# Patient Record
Sex: Female | Born: 1935 | Race: White | Hispanic: No | Marital: Married | Smoking: Never smoker
Health system: Southern US, Community
[De-identification: ages and names within clinical notes are randomized; demographics above are authoritative.]

## PROBLEM LIST (undated history)

## (undated) DIAGNOSIS — E119 Type 2 diabetes mellitus without complications: Secondary | ICD-10-CM

## (undated) DIAGNOSIS — N3010 Interstitial cystitis (chronic) without hematuria: Secondary | ICD-10-CM

## (undated) DIAGNOSIS — H3530 Unspecified macular degeneration: Secondary | ICD-10-CM

## (undated) DIAGNOSIS — R3129 Other microscopic hematuria: Secondary | ICD-10-CM

## (undated) DIAGNOSIS — K219 Gastro-esophageal reflux disease without esophagitis: Secondary | ICD-10-CM

## (undated) DIAGNOSIS — D510 Vitamin B12 deficiency anemia due to intrinsic factor deficiency: Secondary | ICD-10-CM

## (undated) DIAGNOSIS — I10 Essential (primary) hypertension: Secondary | ICD-10-CM

## (undated) DIAGNOSIS — M961 Postlaminectomy syndrome, not elsewhere classified: Secondary | ICD-10-CM

## (undated) DIAGNOSIS — E785 Hyperlipidemia, unspecified: Secondary | ICD-10-CM

## (undated) DIAGNOSIS — M8580 Other specified disorders of bone density and structure, unspecified site: Secondary | ICD-10-CM

## (undated) DIAGNOSIS — R079 Chest pain, unspecified: Secondary | ICD-10-CM

## (undated) DIAGNOSIS — G5601 Carpal tunnel syndrome, right upper limb: Secondary | ICD-10-CM

## (undated) DIAGNOSIS — M545 Low back pain, unspecified: Secondary | ICD-10-CM

## (undated) DIAGNOSIS — N183 Chronic kidney disease, stage 3 unspecified: Secondary | ICD-10-CM

## (undated) DIAGNOSIS — I493 Ventricular premature depolarization: Secondary | ICD-10-CM

## (undated) DIAGNOSIS — G8929 Other chronic pain: Secondary | ICD-10-CM

## (undated) HISTORY — PX: JOINT REPLACEMENT: SHX530

## (undated) HISTORY — DX: Interstitial cystitis (chronic) without hematuria: N30.10

## (undated) HISTORY — DX: Low back pain, unspecified: M54.50

## (undated) HISTORY — DX: Unspecified macular degeneration: H35.30

## (undated) HISTORY — DX: Chronic kidney disease, stage 3 unspecified: N18.30

## (undated) HISTORY — DX: Other chronic pain: G89.29

## (undated) HISTORY — DX: Low back pain: M54.5

## (undated) HISTORY — DX: Postlaminectomy syndrome, not elsewhere classified: M96.1

## (undated) HISTORY — DX: Other microscopic hematuria: R31.29

## (undated) HISTORY — DX: Other specified disorders of bone density and structure, unspecified site: M85.80

## (undated) HISTORY — DX: Essential (primary) hypertension: I10

## (undated) HISTORY — DX: Type 2 diabetes mellitus without complications: E11.9

## (undated) HISTORY — DX: Gastro-esophageal reflux disease without esophagitis: K21.9

## (undated) HISTORY — DX: Carpal tunnel syndrome, right upper limb: G56.01

## (undated) HISTORY — DX: Chronic kidney disease, stage 3 (moderate): N18.3

## (undated) HISTORY — DX: Ventricular premature depolarization: I49.3

## (undated) HISTORY — DX: Hyperlipidemia, unspecified: E78.5

## (undated) HISTORY — DX: Vitamin B12 deficiency anemia due to intrinsic factor deficiency: D51.0

## (undated) HISTORY — PX: BACK SURGERY: SHX140

---

## 1997-06-17 ENCOUNTER — Other Ambulatory Visit: Admission: RE | Admit: 1997-06-17 | Discharge: 1997-06-17 | Payer: Self-pay | Admitting: Family Medicine

## 1997-07-09 ENCOUNTER — Other Ambulatory Visit: Admission: RE | Admit: 1997-07-09 | Discharge: 1997-07-09 | Payer: Self-pay | Admitting: Family Medicine

## 1997-09-07 ENCOUNTER — Inpatient Hospital Stay (HOSPITAL_COMMUNITY): Admission: RE | Admit: 1997-09-07 | Discharge: 1997-09-24 | Payer: Self-pay | Admitting: *Deleted

## 1998-07-18 ENCOUNTER — Inpatient Hospital Stay (HOSPITAL_COMMUNITY): Admission: AD | Admit: 1998-07-18 | Discharge: 1998-08-19 | Payer: Self-pay | Admitting: *Deleted

## 1998-08-22 ENCOUNTER — Encounter (HOSPITAL_COMMUNITY): Admission: RE | Admit: 1998-08-22 | Discharge: 1998-08-26 | Payer: Self-pay

## 1999-03-30 ENCOUNTER — Other Ambulatory Visit: Admission: RE | Admit: 1999-03-30 | Discharge: 1999-03-30 | Payer: Self-pay | Admitting: Family Medicine

## 1999-08-14 ENCOUNTER — Encounter: Payer: Self-pay | Admitting: Family Medicine

## 1999-08-14 ENCOUNTER — Encounter: Admission: RE | Admit: 1999-08-14 | Discharge: 1999-08-14 | Payer: Self-pay | Admitting: Family Medicine

## 2000-07-31 ENCOUNTER — Encounter: Admission: RE | Admit: 2000-07-31 | Discharge: 2000-10-29 | Payer: Self-pay | Admitting: Family Medicine

## 2000-11-13 ENCOUNTER — Encounter: Admission: RE | Admit: 2000-11-13 | Discharge: 2000-11-13 | Payer: Self-pay | Admitting: Family Medicine

## 2000-11-13 ENCOUNTER — Encounter: Payer: Self-pay | Admitting: Family Medicine

## 2001-04-15 ENCOUNTER — Encounter: Admission: RE | Admit: 2001-04-15 | Discharge: 2001-04-15 | Payer: Self-pay | Admitting: Neurosurgery

## 2001-04-15 ENCOUNTER — Encounter: Payer: Self-pay | Admitting: Neurosurgery

## 2001-04-28 ENCOUNTER — Encounter: Payer: Self-pay | Admitting: Neurosurgery

## 2001-04-30 ENCOUNTER — Encounter: Payer: Self-pay | Admitting: Neurosurgery

## 2001-04-30 ENCOUNTER — Inpatient Hospital Stay (HOSPITAL_COMMUNITY): Admission: RE | Admit: 2001-04-30 | Discharge: 2001-05-01 | Payer: Self-pay | Admitting: Neurosurgery

## 2001-09-29 ENCOUNTER — Encounter: Admission: RE | Admit: 2001-09-29 | Discharge: 2001-09-29 | Payer: Self-pay | Admitting: Family Medicine

## 2001-09-29 ENCOUNTER — Encounter: Payer: Self-pay | Admitting: Family Medicine

## 2001-11-18 ENCOUNTER — Other Ambulatory Visit: Admission: RE | Admit: 2001-11-18 | Discharge: 2001-11-18 | Payer: Self-pay | Admitting: Family Medicine

## 2001-11-20 ENCOUNTER — Encounter: Admission: RE | Admit: 2001-11-20 | Discharge: 2001-11-20 | Payer: Self-pay | Admitting: Family Medicine

## 2001-11-20 ENCOUNTER — Encounter: Payer: Self-pay | Admitting: Family Medicine

## 2001-12-31 ENCOUNTER — Ambulatory Visit (HOSPITAL_BASED_OUTPATIENT_CLINIC_OR_DEPARTMENT_OTHER): Admission: RE | Admit: 2001-12-31 | Discharge: 2001-12-31 | Payer: Self-pay | Admitting: Urology

## 2002-04-09 ENCOUNTER — Encounter: Payer: Self-pay | Admitting: Obstetrics and Gynecology

## 2002-04-09 ENCOUNTER — Ambulatory Visit (HOSPITAL_COMMUNITY): Admission: RE | Admit: 2002-04-09 | Discharge: 2002-04-09 | Payer: Self-pay | Admitting: Obstetrics and Gynecology

## 2002-04-21 ENCOUNTER — Ambulatory Visit (HOSPITAL_COMMUNITY): Admission: RE | Admit: 2002-04-21 | Discharge: 2002-04-21 | Payer: Self-pay | Admitting: Gastroenterology

## 2002-04-28 ENCOUNTER — Encounter: Admission: RE | Admit: 2002-04-28 | Discharge: 2002-04-28 | Payer: Self-pay | Admitting: Gastroenterology

## 2002-04-28 ENCOUNTER — Encounter: Payer: Self-pay | Admitting: Gastroenterology

## 2003-01-05 ENCOUNTER — Ambulatory Visit (HOSPITAL_COMMUNITY): Admission: RE | Admit: 2003-01-05 | Discharge: 2003-01-05 | Payer: Self-pay | Admitting: Urology

## 2003-01-05 ENCOUNTER — Ambulatory Visit (HOSPITAL_BASED_OUTPATIENT_CLINIC_OR_DEPARTMENT_OTHER): Admission: RE | Admit: 2003-01-05 | Discharge: 2003-01-05 | Payer: Self-pay | Admitting: Urology

## 2003-04-16 ENCOUNTER — Inpatient Hospital Stay (HOSPITAL_COMMUNITY): Admission: AD | Admit: 2003-04-16 | Discharge: 2003-04-19 | Payer: Self-pay | Admitting: Cardiology

## 2003-05-01 ENCOUNTER — Emergency Department (HOSPITAL_COMMUNITY): Admission: EM | Admit: 2003-05-01 | Discharge: 2003-05-01 | Payer: Self-pay | Admitting: Emergency Medicine

## 2004-03-08 ENCOUNTER — Encounter: Admission: RE | Admit: 2004-03-08 | Discharge: 2004-03-08 | Payer: Self-pay | Admitting: Family Medicine

## 2004-03-16 ENCOUNTER — Encounter: Admission: RE | Admit: 2004-03-16 | Discharge: 2004-03-16 | Payer: Self-pay | Admitting: Family Medicine

## 2004-03-18 ENCOUNTER — Encounter: Admission: RE | Admit: 2004-03-18 | Discharge: 2004-03-18 | Payer: Self-pay | Admitting: Family Medicine

## 2004-03-28 ENCOUNTER — Encounter: Admission: RE | Admit: 2004-03-28 | Discharge: 2004-03-28 | Payer: Self-pay | Admitting: Family Medicine

## 2004-06-08 ENCOUNTER — Encounter: Admission: RE | Admit: 2004-06-08 | Discharge: 2004-06-08 | Payer: Self-pay | Admitting: Gastroenterology

## 2004-07-17 ENCOUNTER — Ambulatory Visit (HOSPITAL_COMMUNITY): Admission: RE | Admit: 2004-07-17 | Discharge: 2004-07-17 | Payer: Self-pay | Admitting: Gastroenterology

## 2004-11-28 ENCOUNTER — Emergency Department (HOSPITAL_COMMUNITY): Admission: EM | Admit: 2004-11-28 | Discharge: 2004-11-28 | Payer: Self-pay | Admitting: Emergency Medicine

## 2005-01-17 ENCOUNTER — Other Ambulatory Visit: Admission: RE | Admit: 2005-01-17 | Discharge: 2005-01-17 | Payer: Self-pay | Admitting: Family Medicine

## 2005-01-18 ENCOUNTER — Encounter: Admission: RE | Admit: 2005-01-18 | Discharge: 2005-01-18 | Payer: Self-pay | Admitting: Family Medicine

## 2005-07-20 ENCOUNTER — Encounter: Admission: RE | Admit: 2005-07-20 | Discharge: 2005-07-20 | Payer: Self-pay | Admitting: Family Medicine

## 2006-09-16 ENCOUNTER — Inpatient Hospital Stay (HOSPITAL_COMMUNITY): Admission: EM | Admit: 2006-09-16 | Discharge: 2006-09-19 | Payer: Self-pay | Admitting: *Deleted

## 2006-09-17 ENCOUNTER — Ambulatory Visit: Payer: Self-pay | Admitting: *Deleted

## 2006-12-12 ENCOUNTER — Inpatient Hospital Stay (HOSPITAL_COMMUNITY): Admission: RE | Admit: 2006-12-12 | Discharge: 2006-12-18 | Payer: Self-pay | Admitting: Orthopedic Surgery

## 2007-10-13 ENCOUNTER — Other Ambulatory Visit: Admission: RE | Admit: 2007-10-13 | Discharge: 2007-10-13 | Payer: Self-pay | Admitting: Family Medicine

## 2007-12-15 ENCOUNTER — Encounter: Admission: RE | Admit: 2007-12-15 | Discharge: 2007-12-15 | Payer: Self-pay | Admitting: Neurosurgery

## 2008-01-06 ENCOUNTER — Inpatient Hospital Stay (HOSPITAL_COMMUNITY): Admission: RE | Admit: 2008-01-06 | Discharge: 2008-01-07 | Payer: Self-pay | Admitting: Neurosurgery

## 2008-08-18 ENCOUNTER — Inpatient Hospital Stay (HOSPITAL_COMMUNITY): Admission: EM | Admit: 2008-08-18 | Discharge: 2008-08-27 | Payer: Self-pay | Admitting: Emergency Medicine

## 2009-07-19 ENCOUNTER — Ambulatory Visit: Payer: Self-pay | Admitting: Pulmonary Disease

## 2009-07-19 ENCOUNTER — Inpatient Hospital Stay (HOSPITAL_COMMUNITY): Admission: EM | Admit: 2009-07-19 | Discharge: 2009-07-27 | Payer: Self-pay | Admitting: Pulmonary Disease

## 2009-07-21 ENCOUNTER — Ambulatory Visit: Payer: Self-pay | Admitting: Psychiatry

## 2009-07-25 ENCOUNTER — Ambulatory Visit: Payer: Self-pay | Admitting: Psychiatry

## 2009-07-27 ENCOUNTER — Inpatient Hospital Stay (HOSPITAL_COMMUNITY): Admission: EM | Admit: 2009-07-27 | Discharge: 2009-07-31 | Payer: Self-pay | Admitting: Psychiatry

## 2009-07-27 ENCOUNTER — Ambulatory Visit: Payer: Self-pay | Admitting: Psychiatry

## 2010-05-22 LAB — GLUCOSE, CAPILLARY
Glucose-Capillary: 67 mg/dL — ABNORMAL LOW (ref 70–99)
Glucose-Capillary: 124 mg/dL — ABNORMAL HIGH (ref 70–99)
Glucose-Capillary: 124 mg/dL — ABNORMAL HIGH (ref 70–99)
Glucose-Capillary: 127 mg/dL — ABNORMAL HIGH (ref 70–99)
Glucose-Capillary: 133 mg/dL — ABNORMAL HIGH (ref 70–99)
Glucose-Capillary: 177 mg/dL — ABNORMAL HIGH (ref 70–99)
Glucose-Capillary: 194 mg/dL — ABNORMAL HIGH (ref 70–99)
Glucose-Capillary: 66 mg/dL — ABNORMAL LOW (ref 70–99)
Glucose-Capillary: 69 mg/dL — ABNORMAL LOW (ref 70–99)
Glucose-Capillary: 90 mg/dL (ref 70–99)
Glucose-Capillary: 104 mg/dL — ABNORMAL HIGH (ref 70–99)
Glucose-Capillary: 107 mg/dL — ABNORMAL HIGH (ref 70–99)
Glucose-Capillary: 119 mg/dL — ABNORMAL HIGH (ref 70–99)
Glucose-Capillary: 122 mg/dL — ABNORMAL HIGH (ref 70–99)
Glucose-Capillary: 123 mg/dL — ABNORMAL HIGH (ref 70–99)
Glucose-Capillary: 124 mg/dL — ABNORMAL HIGH (ref 70–99)
Glucose-Capillary: 139 mg/dL — ABNORMAL HIGH (ref 70–99)
Glucose-Capillary: 157 mg/dL — ABNORMAL HIGH (ref 70–99)
Glucose-Capillary: 159 mg/dL — ABNORMAL HIGH (ref 70–99)
Glucose-Capillary: 163 mg/dL — ABNORMAL HIGH (ref 70–99)
Glucose-Capillary: 163 mg/dL — ABNORMAL HIGH (ref 70–99)
Glucose-Capillary: 165 mg/dL — ABNORMAL HIGH (ref 70–99)
Glucose-Capillary: 97 mg/dL (ref 70–99)

## 2010-05-22 LAB — CBC
HCT: 41.6 % (ref 36.0–46.0)
HCT: 30 % — ABNORMAL LOW (ref 36.0–46.0)
HCT: 31.7 % — ABNORMAL LOW (ref 36.0–46.0)
HCT: 33.1 % — ABNORMAL LOW (ref 36.0–46.0)
Hemoglobin: 10.3 g/dL — ABNORMAL LOW (ref 12.0–15.0)
Hemoglobin: 10.8 g/dL — ABNORMAL LOW (ref 12.0–15.0)
MCHC: 34.8 g/dL (ref 30.0–36.0)
MCV: 90.7 fL (ref 78.0–100.0)
MCV: 89.8 fL (ref 78.0–100.0)
Platelets: 152 10*3/uL (ref 150–400)
Platelets: 159 10*3/uL (ref 150–400)
RBC: 4.59 MIL/uL (ref 3.87–5.11)
RBC: 3.47 MIL/uL — ABNORMAL LOW (ref 3.87–5.11)
RBC: 3.66 MIL/uL — ABNORMAL LOW (ref 3.87–5.11)
RDW: 14.2 % (ref 11.5–15.5)
RDW: 14.5 % (ref 11.5–15.5)
WBC: 7.3 10*3/uL (ref 4.0–10.5)
WBC: 5 10*3/uL (ref 4.0–10.5)
WBC: 6.7 10*3/uL (ref 4.0–10.5)
WBC: 9.3 10*3/uL (ref 4.0–10.5)

## 2010-05-22 LAB — RAPID URINE DRUG SCREEN, HOSP PERFORMED
Amphetamines: NOT DETECTED
Barbiturates: NOT DETECTED
Tetrahydrocannabinol: NOT DETECTED

## 2010-05-22 LAB — BLOOD GAS, ARTERIAL
Drawn by: 307971
FIO2: 1 %
O2 Saturation: 99.6 %
Patient temperature: 98.6
pH, Arterial: 7.445 — ABNORMAL HIGH (ref 7.350–7.400)

## 2010-05-22 LAB — BASIC METABOLIC PANEL
BUN: 6 mg/dL (ref 6–23)
BUN: 6 mg/dL (ref 6–23)
CO2: 25 mEq/L (ref 19–32)
CO2: 28 mEq/L (ref 19–32)
Chloride: 105 mEq/L (ref 96–112)
Chloride: 108 mEq/L (ref 96–112)
Chloride: 109 mEq/L (ref 96–112)
Chloride: 109 mEq/L (ref 96–112)
Creatinine, Ser: 0.76 mg/dL (ref 0.4–1.2)
Creatinine, Ser: 0.87 mg/dL (ref 0.4–1.2)
Creatinine, Ser: 0.99 mg/dL (ref 0.4–1.2)
GFR calc Af Amer: >60 mL/min (ref >60)
GFR calc Af Amer: >60 mL/min (ref >60)
GFR calc non Af Amer: 54 mL/min — ABNORMAL LOW (ref >60)
GFR calc non Af Amer: >60 mL/min (ref >60)
GFR calc non Af Amer: >60 mL/min (ref >60)
Glucose, Bld: 105 mg/dL — ABNORMAL HIGH (ref 70–99)
Glucose, Bld: 160 mg/dL — ABNORMAL HIGH (ref 70–99)
Potassium: 3.5 mEq/L (ref 3.5–5.1)
Potassium: 3.6 mEq/L (ref 3.5–5.1)
Potassium: 3.6 mEq/L (ref 3.5–5.1)
Sodium: 138 mEq/L (ref 135–145)
Sodium: 143 mEq/L (ref 135–145)

## 2010-05-22 LAB — POCT I-STAT, CHEM 8
Calcium, Ion: 1.19 mmol/L (ref 1.12–1.32)
Glucose, Bld: 148 mg/dL — ABNORMAL HIGH (ref 70–99)
HCT: 43 % (ref 36.0–46.0)
Hemoglobin: 14.6 g/dL (ref 12.0–15.0)
TCO2: 29 mmol/L (ref 0–100)

## 2010-05-22 LAB — COMPREHENSIVE METABOLIC PANEL
ALT: 12 U/L (ref 0–35)
Albumin: 2.9 g/dL — ABNORMAL LOW (ref 3.5–5.2)
Alkaline Phosphatase: 62 U/L (ref 39–117)
Calcium: 7.6 mg/dL — ABNORMAL LOW (ref 8.4–10.5)
GFR calc Af Amer: >60 mL/min (ref >60)
Glucose, Bld: 155 mg/dL — ABNORMAL HIGH (ref 70–99)
Potassium: 3.2 mEq/L — ABNORMAL LOW (ref 3.5–5.1)
Sodium: 139 mEq/L (ref 135–145)
Total Protein: 5.1 g/dL — ABNORMAL LOW (ref 6.0–8.3)

## 2010-05-22 LAB — URINE CULTURE
Colony Count: NO GROWTH
Culture: NO GROWTH

## 2010-05-22 LAB — POCT I-STAT 3, ART BLOOD GAS (G3+)
Bicarbonate: 30.5 mEq/L — ABNORMAL HIGH (ref 20.0–24.0)
Patient temperature: 98.3
TCO2: 28 mmol/L (ref 0–100)
TCO2: 32 mmol/L (ref 0–100)
pCO2 arterial: 36.2 mmHg (ref 35.0–45.0)
pCO2 arterial: 41.3 mmHg (ref 35.0–45.0)
pH, Arterial: 7.415 — ABNORMAL HIGH (ref 7.350–7.400)
pH, Arterial: 7.533 — ABNORMAL HIGH (ref 7.350–7.400)
pO2, Arterial: 113 mmHg — ABNORMAL HIGH (ref 80.0–100.0)

## 2010-05-22 LAB — CARDIAC PANEL(CRET KIN+CKTOT+MB+TROPI)
CK, MB: 1.9 ng/mL (ref 0.3–4.0)
CK, MB: 2 ng/mL (ref 0.3–4.0)
Relative Index: INVALID (ref 0.0–2.5)
Total CK: 46 U/L (ref 7–177)
Troponin I: 0.05 ng/mL (ref 0.00–0.06)
Troponin I: 0.11 ng/mL — ABNORMAL HIGH (ref 0.00–0.06)

## 2010-05-22 LAB — HEPATITIS B CORE ANTIBODY, TOTAL: Hep B Core Total Ab: NEGATIVE

## 2010-05-22 LAB — DIFFERENTIAL
Eosinophils Absolute: 0 10*3/uL (ref 0.0–0.7)
Eosinophils Relative: 1 % (ref 0–5)
Lymphs Abs: 0.9 10*3/uL (ref 0.7–4.0)
Monocytes Relative: 5 % (ref 3–12)

## 2010-05-22 LAB — HEPATIC FUNCTION PANEL
Alkaline Phosphatase: 59 U/L (ref 39–117)
Indirect Bilirubin: 0.4 mg/dL (ref 0.3–0.9)
Total Bilirubin: 0.6 mg/dL (ref 0.3–1.2)

## 2010-05-22 LAB — URINALYSIS, ROUTINE W REFLEX MICROSCOPIC
Nitrite: NEGATIVE
Protein, ur: 30 mg/dL — AB
Specific Gravity, Urine: 1.015 (ref 1.005–1.030)
Urobilinogen, UA: 0.2 mg/dL (ref 0.0–1.0)

## 2010-05-22 LAB — URINE MICROSCOPIC-ADD ON

## 2010-05-22 LAB — HEPATITIS B SURFACE ANTIGEN: Hepatitis B Surface Ag: NEGATIVE

## 2010-05-22 LAB — PROTIME-INR
INR: 1.07 (ref 0.00–1.49)
Prothrombin Time: 13.8 seconds (ref 11.6–15.2)

## 2010-05-22 LAB — LITHIUM LEVEL: Lithium Lvl: 0.32 mEq/L — ABNORMAL LOW (ref 0.80–1.40)

## 2010-05-22 LAB — APTT: aPTT: 25 seconds (ref 24–37)

## 2010-05-22 LAB — SALICYLATE LEVEL: Salicylate Lvl: <4 mg/dL (ref 2.8–20.0)

## 2010-05-31 ENCOUNTER — Other Ambulatory Visit: Payer: Self-pay | Admitting: Gastroenterology

## 2010-05-31 ENCOUNTER — Ambulatory Visit (HOSPITAL_COMMUNITY)
Admission: RE | Admit: 2010-05-31 | Discharge: 2010-05-31 | Disposition: A | Discharge status: Home / Self Care | Payer: Medicare Other | Source: Ambulatory Visit | Attending: Gastroenterology | Admitting: Gastroenterology

## 2010-05-31 DIAGNOSIS — K219 Gastro-esophageal reflux disease without esophagitis: Secondary | ICD-10-CM | POA: Insufficient documentation

## 2010-05-31 DIAGNOSIS — Z79899 Other long term (current) drug therapy: Secondary | ICD-10-CM | POA: Insufficient documentation

## 2010-05-31 DIAGNOSIS — K449 Diaphragmatic hernia without obstruction or gangrene: Secondary | ICD-10-CM | POA: Insufficient documentation

## 2010-05-31 DIAGNOSIS — K863 Pseudocyst of pancreas: Secondary | ICD-10-CM | POA: Insufficient documentation

## 2010-05-31 DIAGNOSIS — E119 Type 2 diabetes mellitus without complications: Secondary | ICD-10-CM | POA: Insufficient documentation

## 2010-05-31 DIAGNOSIS — F3289 Other specified depressive episodes: Secondary | ICD-10-CM | POA: Insufficient documentation

## 2010-05-31 DIAGNOSIS — I10 Essential (primary) hypertension: Secondary | ICD-10-CM | POA: Insufficient documentation

## 2010-05-31 DIAGNOSIS — K862 Cyst of pancreas: Secondary | ICD-10-CM | POA: Insufficient documentation

## 2010-05-31 DIAGNOSIS — F329 Major depressive disorder, single episode, unspecified: Secondary | ICD-10-CM | POA: Insufficient documentation

## 2010-05-31 DIAGNOSIS — K314 Gastric diverticulum: Secondary | ICD-10-CM | POA: Insufficient documentation

## 2010-05-31 DIAGNOSIS — K838 Other specified diseases of biliary tract: Secondary | ICD-10-CM | POA: Insufficient documentation

## 2010-05-31 DIAGNOSIS — E785 Hyperlipidemia, unspecified: Secondary | ICD-10-CM | POA: Insufficient documentation

## 2010-05-31 DIAGNOSIS — I2510 Atherosclerotic heart disease of native coronary artery without angina pectoris: Secondary | ICD-10-CM | POA: Insufficient documentation

## 2010-05-31 LAB — GLUCOSE, CAPILLARY: Glucose-Capillary: 142 mg/dL — ABNORMAL HIGH (ref 70–99)

## 2010-06-12 LAB — URINALYSIS, ROUTINE W REFLEX MICROSCOPIC
Nitrite: NEGATIVE
Specific Gravity, Urine: 1.017 (ref 1.005–1.030)
Urobilinogen, UA: 1 mg/dL (ref 0.0–1.0)
pH: 5 (ref 5.0–8.0)

## 2010-06-12 LAB — GLUCOSE, CAPILLARY
Glucose-Capillary: 103 mg/dL — ABNORMAL HIGH (ref 70–99)
Glucose-Capillary: 104 mg/dL — ABNORMAL HIGH (ref 70–99)
Glucose-Capillary: 107 mg/dL — ABNORMAL HIGH (ref 70–99)
Glucose-Capillary: 112 mg/dL — ABNORMAL HIGH (ref 70–99)
Glucose-Capillary: 116 mg/dL — ABNORMAL HIGH (ref 70–99)
Glucose-Capillary: 116 mg/dL — ABNORMAL HIGH (ref 70–99)
Glucose-Capillary: 118 mg/dL — ABNORMAL HIGH (ref 70–99)
Glucose-Capillary: 135 mg/dL — ABNORMAL HIGH (ref 70–99)
Glucose-Capillary: 139 mg/dL — ABNORMAL HIGH (ref 70–99)
Glucose-Capillary: 147 mg/dL — ABNORMAL HIGH (ref 70–99)
Glucose-Capillary: 55 mg/dL — ABNORMAL LOW (ref 70–99)
Glucose-Capillary: 61 mg/dL — ABNORMAL LOW (ref 70–99)
Glucose-Capillary: 68 mg/dL — ABNORMAL LOW (ref 70–99)
Glucose-Capillary: 75 mg/dL (ref 70–99)
Glucose-Capillary: 81 mg/dL (ref 70–99)
Glucose-Capillary: 84 mg/dL (ref 70–99)
Glucose-Capillary: 89 mg/dL (ref 70–99)
Glucose-Capillary: 89 mg/dL (ref 70–99)
Glucose-Capillary: 90 mg/dL (ref 70–99)
Glucose-Capillary: 90 mg/dL (ref 70–99)
Glucose-Capillary: 93 mg/dL (ref 70–99)
Glucose-Capillary: 94 mg/dL (ref 70–99)

## 2010-06-12 LAB — COMPREHENSIVE METABOLIC PANEL
Alkaline Phosphatase: 55 U/L (ref 39–117)
BUN: 31 mg/dL — ABNORMAL HIGH (ref 6–23)
BUN: 90 mg/dL — ABNORMAL HIGH (ref 6–23)
CO2: 23 mEq/L (ref 19–32)
CO2: 24 mEq/L (ref 19–32)
Calcium: 9 mg/dL (ref 8.4–10.5)
Calcium: 9.4 mg/dL (ref 8.4–10.5)
Chloride: 99 mEq/L (ref 96–112)
Creatinine, Ser: 3.46 mg/dL — ABNORMAL HIGH (ref 0.4–1.2)
GFR calc Af Amer: 16 mL/min — ABNORMAL LOW (ref >60)
GFR calc non Af Amer: 46 mL/min — ABNORMAL LOW (ref >60)
GFR calc non Af Amer: 13 mL/min — ABNORMAL LOW (ref >60)
Glucose, Bld: 83 mg/dL (ref 70–99)
Glucose, Bld: 96 mg/dL (ref 70–99)
Potassium: 4.8 mEq/L (ref 3.5–5.1)
Total Bilirubin: 0.5 mg/dL (ref 0.3–1.2)
Total Protein: 5.9 g/dL — ABNORMAL LOW (ref 6.0–8.3)

## 2010-06-12 LAB — CBC
HCT: 34.5 % — ABNORMAL LOW (ref 36.0–46.0)
HCT: 40.9 % (ref 36.0–46.0)
Hemoglobin: 11.6 g/dL — ABNORMAL LOW (ref 12.0–15.0)
Hemoglobin: 13.8 g/dL (ref 12.0–15.0)
MCHC: 33.7 g/dL (ref 30.0–36.0)
MCV: 86.1 fL (ref 78.0–100.0)
Platelets: 161 10*3/uL (ref 150–400)
Platelets: 201 10*3/uL (ref 150–400)
RBC: 4 MIL/uL (ref 3.87–5.11)
RDW: 14.8 % (ref 11.5–15.5)
RDW: 14.7 % (ref 11.5–15.5)
WBC: 5.2 10*3/uL (ref 4.0–10.5)
WBC: 6.7 10*3/uL (ref 4.0–10.5)

## 2010-06-12 LAB — RAPID URINE DRUG SCREEN, HOSP PERFORMED
Barbiturates: NOT DETECTED
Cocaine: NOT DETECTED
Opiates: NOT DETECTED
Tetrahydrocannabinol: NOT DETECTED

## 2010-06-12 LAB — DIFFERENTIAL
Eosinophils Absolute: 0 10*3/uL (ref 0.0–0.7)
Eosinophils Relative: 1 % (ref 0–5)
Lymphocytes Relative: 34 % (ref 12–46)
Lymphs Abs: 2.3 10*3/uL (ref 0.7–4.0)
Monocytes Absolute: 0.7 10*3/uL (ref 0.1–1.0)

## 2010-06-12 LAB — BASIC METABOLIC PANEL
BUN: 20 mg/dL (ref 6–23)
BUN: 29 mg/dL — ABNORMAL HIGH (ref 6–23)
BUN: 61 mg/dL — ABNORMAL HIGH (ref 6–23)
CO2: 27 mEq/L (ref 19–32)
Calcium: 8.8 mg/dL (ref 8.4–10.5)
Calcium: 9.4 mg/dL (ref 8.4–10.5)
Creatinine, Ser: 1.19 mg/dL (ref 0.4–1.2)
Creatinine, Ser: 1.35 mg/dL — ABNORMAL HIGH (ref 0.4–1.2)
Creatinine, Ser: 1.83 mg/dL — ABNORMAL HIGH (ref 0.4–1.2)
GFR calc non Af Amer: 27 mL/min — ABNORMAL LOW (ref >60)
GFR calc non Af Amer: 38 mL/min — ABNORMAL LOW (ref >60)
GFR calc non Af Amer: 44 mL/min — ABNORMAL LOW (ref >60)
Glucose, Bld: 119 mg/dL — ABNORMAL HIGH (ref 70–99)
Glucose, Bld: 121 mg/dL — ABNORMAL HIGH (ref 70–99)
Potassium: 4.5 mEq/L (ref 3.5–5.1)

## 2010-06-12 LAB — HEMOGLOBIN A1C
Hgb A1c MFr Bld: 6.6 % — ABNORMAL HIGH (ref 4.6–6.1)
Hgb A1c MFr Bld: 6.8 % — ABNORMAL HIGH (ref 4.6–6.1)
Mean Plasma Glucose: 143 mg/dL
Mean Plasma Glucose: 148 mg/dL

## 2010-06-12 LAB — ETHANOL: Alcohol, Ethyl (B): <5 mg/dL (ref 0–10)

## 2010-06-12 LAB — CREATININE, URINE, RANDOM: Creatinine, Urine: 264.2 mg/dL

## 2010-06-12 LAB — MAGNESIUM: Magnesium: 2.3 mg/dL (ref 1.5–2.5)

## 2010-06-12 LAB — LITHIUM LEVEL: Lithium Lvl: 1.15 mEq/L (ref 0.80–1.40)

## 2010-06-12 LAB — TSH: TSH: 0.714 u[IU]/mL (ref 0.350–4.500)

## 2010-07-18 NOTE — Discharge Summary (Signed)
[name], [name]                   ACCOUNT NO.:  [id number]   MEDICAL RECORD NO.:  [id number]          PATIENT TYPE:  INP   LOCATION:  3029                         FACILITY:  [facility]   PHYSICIAN:  Corinna L. Lendell Caprice, MDDATE OF BIRTH:  06-Jul-1929   DATE OF ADMISSION:  08/17/2008  DATE OF DISCHARGE:  08/26/2008                               DISCHARGE SUMMARY   ADDENDUM:  This is an addendum to the previously dictated discharge  summary.  The patient has been medically stable.  Her lithium level on  June 23 was slightly elevated at 1.46 (upper limit of normal is 1.40).  Her lithium level was held on June 23 and rechecked this morning.  Her  lithium level is 1.15 today.   I recommend decreasing the dose to 300 mg daily and monitoring lithium  levels or as per inpatient psychiatry attendings.  The patient is  medically stable for transfer and I am told that she has a bed at an  inpatient psychiatric facility.      Corinna L. Lendell Caprice, MD  Electronically Signed     CLS/MEDQ  D:  08/26/2008  T:  08/26/2008  Job:  [id number]

## 2010-07-18 NOTE — Consult Note (Signed)
[name]                   ACCOUNT NO.:  [id number]   MEDICAL RECORD NO.:  [id number]          PATIENT TYPE:  INP   LOCATION:  3029                         FACILITY:  [facility]   PHYSICIAN:  Antonietta Breach, M.D.  DATE OF BIRTH:  28-Apr-1929   DATE OF CONSULTATION:  08/23/2008  DATE OF DISCHARGE:                                 CONSULTATION   REASON FOR CONSULTATION:  Depression.   HISTORY OF PRESENT ILLNESS:  [name] is a 77 year old female  admitted to the [facility] on August 17, 2008, due to severe  depression, acute renal failure with dehydration.   [name] has been experiencing greater than 4 weeks of progressive  depressed mood including anhedonia, poor concentration, and poor energy.  This has progressed to the point that she has been staying in bed and  having severe poverty of speech.   Prior to admission, she had been maintained on her Pristiq 50 mg daily,  Zyprexa 15 mg daily, and Remeron 15 mg daily.  There are no known  precipitating stresses.  She has not been having thoughts of suicide.  She has no hallucinations or delusions.   PAST PSYCHIATRIC HISTORY:  In review of the past medical record, in  March 2005 it was noted that she was on Celexa 40 mg daily, lithium 450  mg daily, and Provigil 100 mg daily   In June of 2008, she experienced depression with psychotic features.  A  diagnosis of schizoaffective disorder has been listed.  Also in the  past, she has been on lithium 300 mg daily, Pristiq 100 mg daily,  Zyprexa 10 mg daily, Remeron 50 mg daily, and Provigil 100 mg daily.   The patient gave permission for the undersigned to interview her  daughter and her husband.  They did not recall any period of time in  which the patient has had increased energy or decreased need for sleep.  However as mentioned, she has been diagnosed with schizoaffective  disorder.   She underwent electroconvulsive therapy in the 1980s.   She was doing well as of 2  years ago.  She was on Pristiq with lithium.  She went off of her lithium in April of this year.   FAMILY PSYCHIATRIC HISTORY:  None known.   SOCIAL HISTORY:  [name] is married.  Her religion is [employer].  She  does not use any alcohol or illegal drugs.  She is retired and lives at  home with her husband.   PAST MEDICAL HISTORY:  1. Chronic low back pain.  2. Gastroesophageal reflux disease.  3. Hyperlipidemia.  4. Osteoarthrosis of the right knee.  5. Lumbar stenosis.   No known drug allergies.   MEDICATIONS:  Her MAR is reviewed.  She is on:  1. Remeron 15 mg daily.  2. Zyprexa 15 mg daily.  3. Effexor 75 mg daily.  4. Ativan 0.5 mg t.i.d. p.r.n.   LABORATORY DATA:  WBC 4.2, hemoglobin 11.6, platelet count 148, sodium  145, BUN 31, creatinine 1.14.   Alcohol, urine drug screen, TSH, SGOT, and SGPT  all unremarkable.   REVIEW OF SYSTEMS:  The patient cannot provide this.  It is gleaned from  an interview with her daughter, her husband, the staff, and the medical  record.   CONSTITUTIONAL; HEAD, EYES, EARS, NOSE, THROAT, MOUTH: NEUROLOGIC;  PSYCHIATRIC; CARDIOVASCULAR; RESPIRATORY; GASTROINTESTINAL;  GENITOURINARY; SKIN; MUSCULOSKELETAL; HEMATOLOGIC/LYMPHATIC;  ENDOCRINE/METABOLIC all unremarkable.   EXAMINATION:  VITAL SIGNS:  Temperature 97.4, pulse 89, respiratory rate  18, blood pressure 158/69, O2 saturation on room air 95%.  GENERAL APPEARANCE:  [name] is an elderly female lying in a supine  position in her hospital bed with no abnormal involuntary movements.   MENTAL STATUS EXAM:  [name] is alert.  Her attention span is  decreased.  Her affect is very constricted.  Her mood is depressed.  Concentration is decreased.  On orientation testing, she is oriented to  all spheres.  On memory testing, she does recall events of the past.  Speech is impoverished with very few words.  She does acknowledge that  she is depressed.  Thought process is coherent.   Thought content:  There  are thoughts of hopelessness and helplessness.  She denies any thoughts  of harming herself.  There is no evidence of hallucinations or  delusions.   Insight is partial regarding her depression.  Her judgment is impaired.   ASSESSMENT:   AXIS I:  1. 293.83:  Mood disorder not otherwise specified, depressed.  2. Major depressive disorder versus schizoaffective disorder,      depressed.   AXIS II:  None.   AXIS III:  See past medical history.   AXIS IV:  General medical.   AXIS V:  Twenty.   The undersigned discussed the indications, alternatives, and adverse  effects of the following with the patient's husband:  Lithium for anti-  depression augmentation including the risk of death, kidney damage, and  thyroid damage; Remeron; Zyprexa; Effexor; Ativan.   He understands and wants to proceed as below.   Would not change any of her other psychotropic agents at this time.   Would restart lithium at 450 mg daily for anti-depression  augmentation/mood stabilization.   Would check a 12-hour trough lithium level in 5 days with a screening  basic metabolic panel.   Would continue to monitor for any stiffness or other extrapyramidal side  effects of her Zyprexa.   Ego supportive psychotherapy.      Antonietta Breach, M.D.  Electronically Signed     JW/MEDQ  D:  08/23/2008  T:  08/23/2008  Job:  [id number]

## 2010-07-18 NOTE — Consult Note (Signed)
NAMEHEIDI, [name]                   ACCOUNT NO.:  [id number]   MEDICAL RECORD NO.:  [id number]          PATIENT TYPE:  INP   LOCATION:  3029                         FACILITY:  [facility]   PHYSICIAN:  Antonietta Breach, M.D.  DATE OF BIRTH:  [birth year]   DATE OF CONSULTATION:  08/23/2008  DATE OF DISCHARGE:                                 CONSULTATION   [name] continues to have severe neurovegetation.  She is reclined in  her hospital chair with no abnormal involuntary movements.  She has  profoundly poor energy, poor concentration and anhedonia.  She feels  rough.  She still has extremely soft speech and poverty of speech.  She has thoughts of hopelessness and helplessness.   EXAMINATION:  VITAL SIGNS:  Temperature 98.2, pulse 92, respiratory rate  18, blood pressure 130/58.   MENTAL STATUS EXAM:  Please see the above.  [name] has her eyes  closed the whole time.  Her affect is very constricted.  Mood is  profoundly depressed.  She is oriented to all spheres.  Her memory is  grossly within normal limits.  Her speech is extremely soft and there is  poverty of speech.  She is able to say a very soft yes and barely nod  her head that she is experiencing depression.  Thought process is  coherent.  Thought content; thoughts of hopelessness and helplessness.  Insight is intact for depression.  Judgment is impaired.   ASSESSMENT:  Axis I:  293.83, mood disorder not otherwise specified.  Danger depressive disorder versus schizoaffective disorder, depressed.  Given her severe neurovegetative depressed state, [name] would be at  risk for passive lethal self-neglect if outside of an institution of  care.   Therefore, the undersigned recommends that she be admitted to an  inpatient geropsychiatric unit for further evaluation and treatment for  depression.   RECOMMENDATIONS:  We will proceed with increasing the Effexor to 112.5  mg XR p.o. q.a.m.  Would check her blood pressure as the  Effexor is  raised.   No change in her lithium, Remeron or Zyprexa.   Continue low stimulation ego support.      Antonietta Breach, M.D.  Electronically Signed     JW/MEDQ  D:  08/23/2008  T:  08/23/2008  Job:  [id number]

## 2010-07-18 NOTE — H&P (Signed)
[name], [name]                   ACCOUNT NO.:  [id number]   MEDICAL RECORD NO.:  [id number]          PATIENT TYPE:  EMS   LOCATION:  MAJO                         FACILITY:  [facility]   PHYSICIAN:  Michelene Gardener, MD    DATE OF BIRTH:  May 26, 1929   DATE OF ADMISSION:  09/16/2006  DATE OF DISCHARGE:                              HISTORY & PHYSICAL   PRIMARY PHYSICIAN:  Donia Guiles, M.D.   CHIEF COMPLAINT:  Slurred speech and numbness x2 weeks.   HISTORY OF PRESENT ILLNESS:  This is a 77 year old Caucasian female with  a past medical history of multiple medical problems including  hypertension, diabetes, and hyperlipidemia, presented with the above  mentioned complaints. The patient is a very poor historian and her  daughter and her husband assisted in her history taking.  They stated  that for the last 2-3 weeks she has been having slurred speech that was  started 1 day after she ate.  As per her family, the slurred speech has  been on and off.  Most of the time she got it in the morning time.  But  now it has become more frequent and it happens almost all the time.  She  also had multiple episodes of fall.  One of them was around 3-4 weeks  ago where she fell and she had a fracture of her right upper extremity,  after which she had a cast.  She also reported numbness on the left side  of her face where she cannot feel her face.  She is not quite sure when  this numbness started.  She called her primary doctor today because  there was no improvement and he advised her to come to the hospital for  further evaluation.  In the emergency room, a CT scan of the head was done and it came to be  normal.  The hospitalist service was called for further evaluation.   PAST MEDICAL HISTORY:  1. Hypertension.  2. Diabetes.  3. Hyperlipidemia.  4. GERD.  5. Depression with psychotic features.  6. Schizoaffective disorder.   CURRENT MEDICATIONS:  1. Actos 30 mg p.o. once daily.  2. Norvasc  10 mg p.o. once daily.  3. Diovan/HCT 320/25 mg p.o. once daily.  4. Glimepiride 4 mg p.o. once daily.  5. Vytorin 10/80 mg one tab p.o. once daily.  6. Metformin HCl ER 500 mg p.o. once daily.  7. Nexium 40 mg p.o. once daily.  8. Lithium ER 300 mg p.o. once daily.  9. Pristig 100 mg p.o. once daily.  10.Rozerem 8 mg p.o. at bedtime.  11.Zyprexa 10 mg p.o. at bedtime.  12.Mirtazapine 50 mg once daily which was stopped by her doctor today.  13.Provigil 100 mg p.o. once daily and was stopped by her doctor      today.  14.Toprol XL 25 mg p.o. once daily.   ALLERGIES:  No known drug allergies.   PAST SURGICAL HISTORY:  1. Appendectomy at age 60.  2. Tonsillectomy as a child.   SOCIAL HISTORY:  She denies smoking, denies alcohol  drinking, and denies  recreational drugs.   FAMILY HISTORY:  Significant for hypertension, diabetes, and  hyperlipidemia.   REVIEW OF SYSTEMS:  As per history and physical and all other systems  were reviewed and they were negative.   PHYSICAL EXAMINATION:  VITAL SIGNS:  Temperature 97.1, blood pressure  116/69 , pulse 79, respiratory rate 18.  GENERAL APPEARANCE:  This is an elderly Caucasian female who is laying  in bed in no acute distress.  HEENT:  Conjunctivae showed no erythema.  Pupils are equal and reactive  to light and accommodation.  There is no ptosis.  Hearing is diminished.  There is no ear discharge or infection.  There is no nasal discharge or  infection or bleeding.  Oral mucosa is dry.  No pharyngeal erythema.  NECK:  Supple.  There is no JVD, no carotid bruit, no lymphadenopathy,  no enlargement of thyroid or thyroid tenderness.  CARDIOVASCULAR:  S1 S2 regular.  There is a positive systolic murmur.  There are no gallops noted.  RESPIRATORY:  The patient is breathing between 16-18.  There is no use  of accessory muscles.  No rales, no rhonchi, and no wheezes.  ABDOMEN:  Soft, nondistended, nontender without hepatosplenomegaly.  Bowel  sounds are normal  .  EXTREMITIES:  Lower extremities with no edema, no rash, and no varicose  veins.  Her lower extremities:  Her left lower extremity  is more  than  the right lower extremity.  There is bilateral plus 1 edema.  She said  this has been a chronic condition.  SKIN:  No rash and no erythema.  NEUROLOGIC:  The patient had slurred speech.  She had decreased  sensation on the right side of her body especially around the right side  of her head.  Her strength seems to be normal.  It is 5/5 in all four  extremities.   LABORATORY RESULTS:  WBC 7, hemoglobin 10.5, hematocrit 31.3, MCV 88.2,  platelets 321.  Sodium 137, potassium 3.8, chloride 105, bicarb 23,  glucose 142, creatinine is 1.6.  A CT scan of the head showed no bleed  as per the ER attending and we will wait for the official reading.   ASSESSMENT:  1. Acute cerebrovascular accident.  The patient's symptoms are highly      suggestive of acute cerebrovascular accident symptoms.  We will      admit the patient to the stroke floor.  A CT scan of the head  came      to be normal.  I will get an MRI of her brain.  I will get a      physical therapy evaluation in addition to occupational therapy      evaluation.  Also, I will get speech and a swallow evaluation.  We      will get an MRI and MRA of her brain in addition to an      echocardiogram and carotid Doppler.  We will start her on aspirin      325 mg once daily.  2. Hypertension.  We will continue her current hypertensive      medications.  And, we will watch her blood pressure.  The aim is to      keep her systolic blood pressure around 150 in the setting of her      acute stroke.  3. Diabetes mellitus.  We will continue her on her current medications      and we will put her on  a moderate sliding scale during her      hospitalization.  4. Hyperlipidemia.  We will continue her current medications.  We will      get a lipid profile.  5. Frequent falls.  This  might be related to her underlying stroke.      This also might be explained by her knee problems since she has      knee arthritis and she is supposed to go for knee replacement      around 2 months ago but she did not go by herself.   DISPOSITION:  Otherwise other medical conditions are stable and I will  continue the rest of her medications at this time.   TOTAL ASSESSMENT TIME:  Is 1 hour.      Michelene Gardener, MD  Electronically Signed     NAE/MEDQ  D:  09/16/2006  T:  09/16/2006  Job:  [id number]   cc:   Donia Guiles, M.D.

## 2010-07-18 NOTE — Discharge Summary (Signed)
[name], [name]                   ACCOUNT NO.:  [id number]   MEDICAL RECORD NO.:  [id number]          PATIENT TYPE:  INP   LOCATION:  5123                         FACILITY:  [facility]   PHYSICIAN:  Kela Millin, M.D.DATE OF BIRTH:  Jun 05, 1929   DATE OF ADMISSION:  09/16/2006  DATE OF DISCHARGE:  09/19/2006                               DISCHARGE SUMMARY   DISCHARGE DIAGNOSES:  1. Periodic dysarthria.  2. Hypertension.  3. Diabetes mellitus.  4. Hyperlipidemia.  5. History of depression with psychotic features.  6. History of schizo-affective disorder.  7. Gastroesophageal reflux disease.  8. History of essential tremor.   PROCEDURES AND STUDIES:  1. CAT scan of the head - no acute intracranial abnormality.  2. MRI of the brain - no evidence of acute ischemia, nonspecific      subcortical white matter changes supratentorially had at left      paramedian pons.  This probably represents areas of ischemic      gliosis due to small vessel disease due to hypertension and/or      diabetes with less likely possibility of a demyelinating process of      vasculitis - per radiologist.  3. MRA - possible mild narrowing of the left ICA distal cavernous      segment.  No occlusions, dissections or aneurysms seen otherwise.  4. MRA of the neck - suggestion of mild stenosis at the origin of the      right vertebral artery.  Mild kink and mild narrowing in the      proximal right ICA distal to the bulb.  Brachiocephalic vessels at      origins are suboptimally visualized on this exam.  5. 2-D echocardiogram - her ejection fraction 60-65%, overall left      ventricular systolic function normal.  No diagnostic      echocardiographic evidence of a cardiac source of embolism.  6. Carotid Doppler ultrasound - 40-60% ICA stenosis on the right and      no evidence of significant ICA stenosis on the left.   CONSULTATION:  Neurology, Dr. Sharene Skeans.   BRIEF HISTORY AND ADMISSION PHYSICAL EXAM:  The  patient is a 77 year old  white female with above listed medical problems who presented with  complaints of slurred speech and numbness times 2 weeks.  It was noted  that the patient is a poor historian and her daughter and husband  assisted with the history.  Per family, her slurred speech was mostly in  the mornings and she had had it for 2-3 weeks.  Also family complained  that the patient had had frequent falls and one of them about 3-4 weeks  ago resulted in a fracture of her right upper extremity and it is  currently in a cast.  Patient reported numbness of the left side of her  face and feeling that she could not feel on that left side.   PHYSICAL EXAMINATION:  VITAL SIGNS:  Her physical exam upon admission as  per Dr. Arthor Captain revealed the temperature of97.1, blood pressure of  116/69, pulse of 79,  respiratory rate of 18.  PERTINENT FINDINGS ON EXAM:  NECK:  No carotid bruits appreciated.  NEUROLOGIC:  She was noted to have slurred speech, also decreased  sensation on the right side of her body especially around the right side  of her head and her strength was reported to be 5/5 in all 4  extremities.  EXTREMITIES:  1+ edema noted to be chronic.   The rest of her physical exam was noted to be within normal limits.   LABORATORY DATA:  Her white cell count 7 with a hemoglobin of 10.5,  hematocrit of 31.3, MCV 88.2, platelet count 321.  Sodium of 137,  potassium 3.8, chloride 105, bicarb of 23, glucose 142, creatinine of  1.6.  The CT scan, MRI and MRA are as above.   HOSPITAL COURSE:  1. Periodic dysarthria.  Upon admission, patient was started on      aspirin 325 mg daily and an MRI, MRA, carotid Doppler ultrasound      and a 2-D echo were ordered and the results are as stated above.      Because the patient continued to have this periodic slurred      speech while in the hospital, Neurology was consulted and Dr.      Sharene Skeans saw the patient. His impression was that this  periodic      dysarthria could be an organic condition - question myasthenia but      stated that against this was the fact that the patient tends to be      worse rather than better at rest.  Regarding the patient's facial      numbness, he indicated that the only consistent area was V2 but she      split midline with left greater than right and indicated that this      was functional.  Dr. Sharene Skeans also indicated that the patient does      have a peripheral stocking neuropathy with preserved      proprioception.  On his evaluation of the patient's gait, he noted      that she does have a festinating gait which is seen with Parkinson      but that no other features were seen consistent with Parkinson.  It      was noted that patient was in pain following the fall in which she      injured herself and had some right knee arthritis as well, and so      Dr. Darl Householder impression was that this was mechanical and not      neurologic and agreed with home health PT which the patient is to      continue upon discharge.  Overall, the neurologist indicated that      his impression was rather than TIA, that the symptoms are      functional, he recommended the patient follows up with Dr. Nash Shearer      in 3 months for that evaluation/reevaluation at that time.  Per      Neurology it is okay to discharge patient today and she is to have      a fasting lipid profile, a hemoglobin A1C and a homocystine level      done by her primary care physician when she follows up outpatient.  2. Hypertension.  Patient was maintained on her outpatient medications      during her hospital stay and is to continue this upon discharge.  3. Diabetes mellitus.  Patient is  to continue her outpatient      medications upon discharge.  4. Gastroesophageal reflux disease.  Patient is to continue her      Nexium.  5. History of depression with psychotic features and schizo-affective      disorder.  Patient is to continue her  outpatient medications and      follow up with her psychiatrist.   DISCHARGE MEDICATIONS:  1. Enteric-coated aspirin 325 mg p.o. daily.  2. Patient is to continue Actos, Norvasc, Diovan HCT , Amaryl,      Vytorin, metformin, Nexium, Lithium, Pristiq, Rozerem, Zyprexa,      Toprol XL as prior to admission.   FOLLOWUP CARE:  1. Dr. Arvilla Market in 1 week.  Patient is to have a homocystine level,      fasting lipid profile and hemoglobin A1C done upon followup.  2. Dr. Nash Shearer in 3 months.  Patient to call for appointment.  3. Home Health PT to continue.   DISCHARGE CONDITION:  Stable.      Kela Millin, M.D.  Electronically Signed     ACV/MEDQ  D:  09/19/2006  T:  09/19/2006  Job:  [id number]   cc:   Donia Guiles, M.D.  Bevelyn Buckles. Nash Shearer, M.D.

## 2010-07-18 NOTE — H&P (Signed)
[name], [name] NO.:  [id number]   MEDICAL RECORD NO.:  [id number]          PATIENT TYPE:  EMS   LOCATION:  MAJO                         FACILITY:  [facility]   PHYSICIAN:  Ramiro Harvest, MD    DATE OF BIRTH:  02/22/1930   DATE OF ADMISSION:  08/17/2008  DATE OF DISCHARGE:                              HISTORY & PHYSICAL   PRIMARY CARE PHYSICIAN:  Donia Guiles, M.D. of [facility].   PSYCHIATRIST:  Daine Floras, M.D.   HISTORY OF PRESENT ILLNESS:  [name] is a 77 year old white female  history of depression, anxiety, bipolar disorder, schizoaffective  disorder, chronic low back pain, gastroesophageal reflux disease,  history of periodic dysarthria presented to the ED with an approximately  5-week history of decreased p.o. intake, insomnia, generalized weakness,  decreased concentration, decreased energy, anhedonia, decreased  interests and feelings of depression.  The patient denies any suicidal  ideation or homicidal ideation.  The patient also states that she just  does not feel hungry the patient denies any fever no chills, no cough,  no nausea or vomiting or diarrhea.  No constipation.  No dysuria, no  hematemesis, no hematochezia.  No focal neurological symptoms.  No chest  pain or shortness of breath, no palpitations, no abdominal pain, no  headache.  The patient and daughter do endorse decreased urinary output.  The patient does have a flat affect and is not speaking in as much which  daughter states is a change from the patient's baseline.  The patient  was seen in the ED.  CBC obtained was within normal limits.  Alcohol  level less than 5.  CMET with a sodium of 134, BUN of 90, creatinine of  3.46, otherwise within normal limits.  UDS was negative.  Head CT was  negative.  We were called to admit the patient for further evaluation  and management.   Patient and daughter went to see PCP on August 16, 2008 where the patient  was seen.   Lab work was done and was noted that the patient was in acute  renal failure and noted to be dehydrated.  The patient's PCP's nurse  called the patient and advised the patient to come to the hospital for  admission.  It was also felt that the patient was likely depressed as  well.   ALLERGIES:  No known drug allergies.   PAST MEDICAL HISTORY:  1. Hypertension.  2. Diabetes.  3. Hyperlipidemia.  4. Gastroesophageal reflux disease.  5. A history of depression with psychotic features.  6. Schizoaffective disorder.  7. Anxiety.  8. Bipolar disorder.  9. His chronic low back pain.  10.Periodic dysarthria.  11.Essential toward tremor.  12.Right knee osteoarthrosis.  13.Status post right total knee and right total knee arthroplasty done      per Dr. Rennis Chris, December 12, 2006.  14.History of gastric erosions per EGD of 07/2004.  15.Diverticulosis per colonoscopy of February 2004.  16.Chronic pelvic pain.  17.Nonobstructive coronary artery disease per cath of February 2005.  18.Lumbar stenosis, spondylosis and herniated nucleus pulposus at  L2-3      and L3-4, status post decompressive laminectomy, decompression of      L2 and L3 roots on the right side with diskectomy, decompressive      laminectomy, decompression of L3-L4 root on the left side,      microdissection with the microscope per Dr. Phoebe Perch on January 06, 2008.  19.Lumbar stenosis, spondylosis and herniated nucleus pulposus L4-5,      L5-S1, status post bilateral decompressive hemilaminectomy L4-5      diskectomy, L5-S1 done per Dr. Phoebe Perch April 30, 2001.  20.Stenosis, spondylosis and herniated nucleus pulposus L4-5, L5-S1      status post decompressive bilateral laminectomies at L4-5 with      diskectomies and left decompressive hemilaminectomy at L5-S1      microdissection microscope done per Dr. Phoebe Perch April 30, 2001.   HOME MEDICATIONS.:  1. Norvasc 10 mg p.o. q.a.m.  2. Fish oil 1000 mg p.o. b.i.d.  3.  Diovan 160/25 p.o. q.a.m.  4. Glimepiride 4 mg p.o. q.a.m.  5. Januvia 50 mg p.o. q.a.m. which was recently decreased on August 16, 2008.  6. Lopressor 25 mg p.o. q.a.m.  7. Vytorin 10/80 mg p.o. q.a.m.  8. Nexium 40 mg p.o. q.h.s.  9. Pristiq 50 mg p.o. q.a.m.  10.Zyprexa 15 mg p.o. q.h.s. which was recently increased on Jul 27, 2008.  11.Rozerem 8 mg p.o. q.h.s.  12.Mirtazapine 15 mg p.o. q.h.s. recently started Jul 30, 2008.  13.Actos 45 mg p.o. daily, recently increased Jul 29, 2008.   SOCIAL HISTORY:  The patient is married, no tobacco use.  No alcohol  use.  No IV drug use.   FAMILY HISTORY:  Noncontributory.   REVIEW OF SYSTEMS:  As per HPI, otherwise negative.   PHYSICAL EXAMINATION:  Temperature 97.1, blood pressure 119/73 pulse of  97, respirations 27, saturation 96% on room air.  GENERAL:  Patient in no apparent distress with a flat affect.  HEENT: Normocephalic, atraumatic.  Pupils equal, round, reactive to  light and accommodation.  Extraocular movements intact.  Oropharynx with  some whitish exudate on the tongue likely thrush.  No lesions.  NECK:  Supple.  No lymphadenopathy.  Dry mucous membranes.  RESPIRATORY:  Lungs are clear to auscultation bilaterally.  No wheezes,  no crackles.  No rhonchi.  CARDIOVASCULAR: Regular rate and rhythm.  No murmurs, rubs or gallops.  ABDOMEN: Soft, nontender, nondistended.  Positive bowel sounds.  EXTREMITIES: No clubbing, cyanosis or edema.  NEUROLOGICAL:  The patient is alert and oriented x3.  Cranial nerves II-  XII are grossly intact.  No focal deficits.  PSYCH:  Flat affect.   ADMISSION LABORATORY:  Urine drug screen was negative.  CMET:  Sodium  134, potassium 4.4, chloride 99, bicarb 24, glucose 96, BUN 90,  creatinine 3.64, bilirubin of 0.5, alkaline phosphatase 70, AST 18, ALT  10, protein 7.1, albumin 3.7 and a calcium of 9.4.  Urinalysis was  yellow, hazy, specific gravity 1.017, pH of 5, glucose negative,   bilirubin moderate, ketones 15, blood negative, protein negative,  urobilinogen 1.0, nitrite negative, leukocytes negative.  Alcohol level  less than 5.  CBC:  White count 6.70, hemoglobin 13.8, hematocrit 40.9,  platelets 201.  ANC of 3.6.  A head CT done showed no acute intracranial  abnormality.  EKG with normal sinus rhythm.   ASSESSMENT AND PLAN:  Ms. Kresta Templeman is a 77 year old female  history of  major depressive disorder, anxiety, bipolar disorder, chronic back pain  who presents to the emergency department with decreased oral intake,  insomnia, decreased concentration, decreased energy, feelings of  depression and found to be in acute renal failure and dehydrated.  1. Acute renal failure.  Baseline creatinine of 1.11 in October 2009      likely secondary to prerenal azotemia secondary to dehydration in      the setting of ARB versus renal versus post renal.  Check a      fractional excretion of sodium.  Check a renal ultrasound.  Hydrate      with IV fluids and follow.  Hold Diovan and blood pressure      medications for now and monitor.  2. Thrush.  Will place on Diflucan.  This might also be the etiology      of her decreased appetite.  May consider GI consult for possible      endoscopy to rule out esophageal candidiasis. Will follow.  3. Dehydration.  IV fluids.  4. Major depressive disorder.  Continue home dose Pristiq, Ativan as      needed and psych consult in the morning for further evaluation and      management.  5. Schizoaffective disorder, bipolar disorder.  Continue Zyprexa.      Consult psychiatry in the morning for further evaluation and      management.  6. Type 2 diabetes.  Hold oral hypoglycemics.  Check a hemoglobin A1c.      Place on sliding scale insulin.  7. Hypertension.  Hold blood pressure medications.  8. Chronic low back pain, oxycodone as needed.  9. Nonobstructive coronary artery disease, stable.  Will hold the      patient's ARB secondary to  problem #1 and 3.  Will resume the      patient's beta blocker.  10.Essential tremor.  Continue beta-blocker.  11.Gastroesophageal reflux disease.  Protonix.  12.Hyperlipidemia.  Check a fasting lipid panel.  Continue home dose      Vytorin.  13.Prophylaxis:  Protonix for gastrointestinal prophylaxis.  Lovenox      for deep vein thrombosis prophylaxis.   It has been a pleasure taking care of Hoy Morn.      Ramiro Harvest, MD  Electronically Signed     DT/MEDQ  D:  08/18/2008  T:  08/18/2008  Job:  [id number]   cc:   Donia Guiles, M.D.  Daine Floras, M.D.

## 2010-07-18 NOTE — Op Note (Signed)
[name], [name]                   ACCOUNT NO.:  [id number]   MEDICAL RECORD NO.:  [id number]          PATIENT TYPE:  INP   LOCATION:  5009                         FACILITY:  [facility]   PHYSICIAN:  Vania Rea. Supple, M.D.  DATE OF BIRTH:  22-Dec-1929   DATE OF PROCEDURE:  12/12/2006  DATE OF DISCHARGE:                               OPERATIVE REPORT   PREOPERATIVE DIAGNOSIS:  End stage right knee osteoarthrosis.   POSTOPERATIVE DIAGNOSIS:  End stage right knee osteoarthrosis.   PROCEDURE:  Cemented right total knee arthroplasty utilizing a DePuy  Sigma implant, size 3 femur, size 2.5 tibia, 10 mm thick rotating  platform polyethylene insert, and a 32 mm patellar button.   SURGEON:  Vania Rea. Supple, M.D.   Threasa HeadsFrench Ana A. Shuford, P.A.-C.   ANESTHESIA:  General endotracheal as well as a preop femoral nerve  block.   TOURNIQUET TIME:  55 minutes.   ESTIMATED BLOOD LOSS:  200 mL.   DRAINS:  Hemovac drain x1.   HISTORY:  [name] is a 77 year old female who has had progressively  increasing right knee pain and functional limitations secondary to  osteoarthrosis.  Her symptoms have been refractory to prolonged attempts  at conservative management.  Due to her ongoing pain and functional  limitations, she is brought to the operating at this time for planned  right total knee arthroplasty.   Preoperatively, we counseled [name] on treatment options as well as  risks versus benefits thereof.  Possible surgical complications of  bleeding, infection, neurovascular injury, DVT, PE, persistent pain,  loss of motion, recurrence of instability and/or failure of the implant  were reviewed.  She understands and accepts and agrees with the planned  procedure.   PROCEDURE IN DETAIL:  After undergoing routine preop evaluation, the  patient received prophylactic antibiotics.  A femoral nerve block was  established in holding area by the anesthesia department.  She was  placed supine on  the operating table and underwent the smooth induction  of a general endotracheal anesthesia.  A tourniquet was applied to the  right thigh and the right leg was sterilely prepped and draped in a  standard fashion.  The leg was exsanguinated and the tourniquet inflated  to 350 mmHg.  An anterior midline incision was made approximately 15 cm  in length centered over the patella.  Skin flaps were mobilized and  sutured back.  A medial parapatellar arthrotomy performed, the patella  everted.  Approximately 75% of the fat pad was excised.  The knee was  flexed up and the cruciates were divided and the remnants of menisci  were removed.   A drill was used to gain access to the femoral canal which was then  irrigated and the intramedullary guide passed.  The distal femoral cut  was made 5 degrees valgus removing 1 mm of bone from the distal femur.  The distal femur was then sized with a size 3 having the best fit.  The  size 3 cutting guide was then applied and we made the anterior,  posterior, and chamfer  cuts on the distal femur.  The trial was placed  showing an excellent fit.  Attention was turned to the proximal tibia  which was exposed and then a proximal tibial cut was made removing 10 mm  of bone measured from the lateral tibial plateau to make sure there was  proper overall alignment.  The proximal tibia was then measured with a  2.5 size implant which had the best coverage.  This was then pinned in  position and placed trial implants and this showed good soft tissue  balance with symmetric flexion and extension gaps.  The proximal tibia  was then terminally prepared with the reamer and broach.  Attention then  returned to the distal femur where we used the box cutting guide to make  the box cut for the femoral implant.  The final femoral implant was then  trialed and, again, good soft tissue balance was achieved.  Attention  was then returned to the patella which was exposed.  An  oscillating saw  was then used to remove approximately 8 mm of bone and a 52 mm patellar  button had the best coverage and the stabilizing drill holes were then  drilled.   At this point all in all trials removed.  The joint was copiously  irrigated.  We used an osteotome to remove bony prominence from the  posterior aspect of the femoral condyles and the posterior compartments  were meticulously cleaned and debrided.  Pulsatile lavage was used to  meticulously clean the joint.  Cement was then mixed and the implants  were then all cemented in position.  All extra cement was meticulously  removed.  After the cement had hardened, the knee was again taken  through a range of motion showing good soft tissue balance.  The final  10 mm tray was then placed into position. Again, the knee was taken  through a range of motion and had good patellar tracking and good soft  tissue balance.  A Hemovac drain was brought out superolaterally.  The  tourniquet was let down and hemostasis was obtained.  The wound was  closed with interrupted figure-of-eight #1 Vicryl for the parapatellar  arthrotomy, 2-0 Vicryl for the subcu, and intracuticular 3-0 Monocryl  for the skin followed by Steri-Strips.  A bulky dry dressing was wrapped  about the right knee, the leg was wrapped in an Ace bandage, support  stocking, ice back, and knee immobilizer.  The patient was then  extubated and taken to the recovery room in stable condition.      Vania Rea. Supple, M.D.  Electronically Signed     KMS/MEDQ  D:  12/12/2006  T:  12/12/2006  Job:  [id number]

## 2010-07-18 NOTE — Consult Note (Signed)
[name], [name]                   ACCOUNT NO.:  [id number]   MEDICAL RECORD NO.:  [id number]          PATIENT TYPE:  INP   LOCATION:  5123                         FACILITY:  [facility]   PHYSICIAN:  Deanna Artis. Hickling, M.D.DATE OF BIRTH:  02-26-1930   DATE OF CONSULTATION:  09/18/2006  DATE OF DISCHARGE:  09/19/2006                                 CONSULTATION   CHIEF COMPLAINT:  Intermittent slurred speech, numbness, and gait  disorder.   I was asked by Dr. Suanne Marker to see [name], a 77 year old woman who has  had a 3-week history of numbness in her left face and to a lesser extent  her left arm, organic gait disorder with falling (with a right forearm  fracture) and with periods of slurred speech.  Slurred speech seems to  be most prominent in the morning, although it has occurred at other  times during the day.  It lasts for minutes and then seems to resolve.  The patient has not had any significant problems with swallowing,  diplopia or true dysphagia.  She has not had symptoms of vertigo and has  not experienced true focal or lateralized weakness of her arms or legs.  She has very severe osteoarthritis of the right knee and when she walks,  she feels as if her legs will give away.  She has had problems with a  very tentative gait, which is exacerbated by her arthritis, the pain  that she has in her legs, and the fact that she has already fallen and  hurt herself.   In addition, in the hospital she has demonstrated some cognitive  dysfunction that has been noted by multiple examiners including a speech  therapist, who noted that she was only able to name two animals in 1  minute's time, which is far below normal.   In this setting, I was asked to evaluate her to determine the etiology  of her dysfunction and to make further recommendations for workup and  treatment.   Past medical history is remarkable:  1. Hypertension.  2. Type 2 diabetes mellitus.  3. Dyslipidemia.  4.  Gastroesophageal reflux disease.  5. Depression with psychotic features.  6. Schizoaffective disorder.   PAST SURGICAL HISTORY:  1. Appendectomy at age 55.  2. Tonsillectomy as a child.   CURRENT MEDICATIONS:  1. Actos 30 mg daily.  2. Norvasc 10 mg daily.  3. Diovan/hydrochlorothiazide 320/25 mg daily.  4. Glimepiride  4 mg daily.  5. Vytorin 10/80 mg one tablet daily.  6. Metformin 5 mg daily.  7. Nexium 40 mg daily.  8. Lithium ER 300 mg daily.  9. Pristiq 100 mg daily.  10.Rozerem 8 mg at bedtime.  11.Zyprexa 10 mg at bedtime.  12.Toprol XL 25 mg once daily.   She had been on mirtazapine 15 mg once daily and Provigil 100 mg once  daily, but these were stopped by her psychiatrist, Dr. Betti Cruz, today.   DRUG ALLERGIES:  None known.   FAMILY HISTORY:  Remarkable for hypertension, diabetes and dyslipidemia.   SOCIAL HISTORY:  The patient does  not use tobacco, alcohol or  recreational drugs.  She is married and her husband is at bedside at  this time.   A 12-system review is negative for headache, chest pain, abdominal pain,  dizziness, blurred vision or diplopia, suicidal ideation or visual or  auditory hallucinations.   On examination today, this is a pleasant woman who is tired, has a  rather flat affect, is right-handed, and  appears her stated age.  Temperature 99.2, blood pressure 139/61, resting pulse 79, respirations  18, oxygen saturation 92%.  Capillary glucoses have been 148 and 164  today.   GENERAL PHYSICAL EXAMINATION:  HEAD AND NECK:  No signs infection.  No  bruits.  LUNGS:  Clear.  HEART:  No murmurs.  Pulses normal.  ABDOMEN:  Soft, nontender.  Bowel sounds normal.  Abdomen is  protuberant.  Extremities show crepitus and pain in the right knee.  The knee is  enlarged and tender.  I do not appreciate effusions, and it is not warm  or red.  She has arthritis elsewhere, but it is worst at the right knee.  She has no significant pitting edema in her  ankles.  There is no  cyanosis.  There is a fungal infection in the right big toe.   NEUROLOGIC EXAMINATION:  MENTAL STATUS:  Mini-Mental Status was carried  out and she had difficulty.  She was not able to do calculations and  could not spell the word world backwards at all.  She could only  recall 1/3 objects at 3 minutes and only had one off orientation, namely  the location.  Her language was fairly intact.  CRANIAL NERVES:  Round, reactive pupils.  Fundi normal.  Visual fields  full.  Extraocular movements full.  Symmetric facial strength.  She has  altered sensation in the face with numbness that seems predominately in  the left be to x-rays of the left V2 distribution that splits the  midline.  In addition, she senses the tuning fork as being less  prominent on the left forehead than on the right.  These are functional  findings.  Her sensory examination was inconsistent and was quite different from me  than it was for my resident.  The patient has a stocking neuropathy to upper calf on the left leg and  mid calf on the right.  She had acceptable stereognosis and vibratory  sensation in her extremities and had fairly good stereognosis,  particularly for the hand that was in a cast, where she really could not  manipulate objects very well.  Gait was not tested by me but was by my resident (the patient was tired  and really did not want to get out of bed).  Gait was festinating with  very tiny steps without a broad base and without circumduction.  This is  sometimes seen with Parkinson disease, but she shows no other signs of  cogwheel rigidity, bradykinesia, mask-like facies, or change in her the  tone and quality of her speech.  Reflexes were absent.  The patient had bilateral flexor plantar  responses.  She had no frontal lobe release signs.   I have reviewed her MRI and MRA.  There are some areas of small-vessel  disease in the subcortical white matter, left frontal,  right  parietal, in the right brain in the cerebral peduncle, and in the  right mid pons.  She does not have widespread small-vessel disease.  She  has some atrophy with what appeared to be  dilated venous spaces in the  basal ganglia.  She does not have any lacunar or larger vessel strokes,  no subdurals.  Her ventricles are enlarged, her sulci are enlarged,  indicating a generalized atrophy.  She does not have significant atrophy  of her cerebellum beyond what is seen in her cerebral cortex and  subcortical regions.   In addition, it appears to me that her hippocampal formations are small.   IMPRESSION:  1. Periodic dysarthria, 784.5.  The only organic condition that occurs      in this fashion is myasthenia gravis.  Against this is that she      tends to be more affected when she has been well-rested and there      are no other muscles that seem to be affected with this.  2. Numbness in the left face.  This is likely functional.  772.0.  3. 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  357.4.  4. Festinating gait, 781.0.  I think that this is a mixture of      orthopedic and mechanical factors related to arthritis and pain and      not related to something like Parkinson disease.  She does not have      any other signs and symptoms of that.  5. Finally, she has significant cognitive dysfunction.  She scored no      better than 22 out of 30 on her Mini-Mental Status Examination.      She scored only 2 in 1 minute in animal naming.  I think that she      may have dementia, and we need to consider placing her on Aricept.      I do not know if she was just nervous and functioning below what      her normal abilities are.  Dr. Betti Cruz knows her much better and      might be able to speak to that.  Associated with this is the      atrophy of her hippocampal formations.  Noticed by Dr. Nash Shearer a      year ago, do not make mention of significant cognitive problems.      We have to keep in  mind that she has underlying psychiatric      disorder and that I could be misled.  6. Finally, she has essential tremor which is being treated with      Remeron.  Other medications which can be useful include      propranolol, which could exacerbate her depression; Topamax, which      can diminish her cognitive function; and Mysoline, which can also      diminish cognitive function and cause drowsiness.  I would not      likely change treatment at this time.   I think that rather than transient ischemic attacks, that most her  symptoms are functional.  Workup has been adequate to address large-  vessel disease.  She has a paucity of small-vessel disease in the  brainstem and subcortical white matter on MRI.  We will recheck  treatable causes of stroke including hemoglobin A1c, serum lipid panel  and homocysteine, which are mostly being addressed at this time.  The  only other concern to address is whether or not to place her on Aricept  5 mg at bedtime.   I appreciate the opportunity to participate in her care.  She should  follow up my partner, Dr. Imagene Gurney, in about 3 months' time, sooner  depending upon clinical need.      Deanna Artis. Sharene Skeans, M.D.  Electronically Signed     [facility]/MEDQ  D:  09/19/2006  T:  09/20/2006  Job:  [id number]   cc:   Kela Millin, M.D.

## 2010-07-18 NOTE — Discharge Summary (Signed)
NAMECHAQUETTA, SCHLOTTMAN                   ACCOUNT NO.:  [id number]   MEDICAL RECORD NO.:  [id number]          PATIENT TYPE:  INP   LOCATION:  5009                         FACILITY:  [facility]   PHYSICIAN:  Tracy A. Shuford, P.A.-C.DATE OF BIRTH:  Oct 28, 1929   DATE OF ADMISSION:  12/12/2006  DATE OF DISCHARGE:  12/17/2006                               DISCHARGE SUMMARY   ADMISSION DIAGNOSES:  1. Right knee osteoarthrosis.  2. Hypertension.  3. Reflux.  4. Anxiety, depression/bipolar.  5. Chronic low back pain.  6. Noninsulin-dependent diabetes.   DISCHARGE DIAGNOSES:  1. Right knee osteoarthrosis.  2. Hypertension.  3. Reflux.  4. Anxiety, depression/bipolar.  5. Chronic low back pain.  6. Noninsulin-dependent diabetes.  7. Status post right total knee arthroplasty.  8. Postoperative hemorrhagic anemia requiring transfusion.   OPERATIONS:  Right total knee arthroplasty.   SURGEON:  Vania Rea. Supple, M.D.   Threasa HeadsFrench Ana A. Shuford, P.A.-C.   ANESTHESIA:  Under general anesthetic with femoral nerve block.   HISTORY:  Miss Maciolek is a well known 77 year old female who has had  progressive right knee pain and dysfunction secondary to known  osteoarthrosis.  She has been refractory prolonged conservative  management.  At this time she has had ongoing pain as well as x-rays  indicative of end-stage osteoarthrosis and we have discussed planned  total knee arthroplasty and risks and benefits thereof.  Risks and  benefits were discussed at length with family and they were in agreement  and wished to proceed.   HOSPITAL COURSE:  The patient admitted and underwent the above-named  procedure and tolerated this well.  All appropriate IV antibiotics,  analgesics were utilized.  Postoperatively she was placed on  weightbearing as tolerated on total knee replacement protocol with the  CPM.  She was placed on Coumadin for DVT and PE prophylaxis.  She did  experience postoperative hemorrhagic  anemia with a hemoglobin drop in  the 7.8 and was transfused 2 units without sequelae.  She had  significant improvement and was able to participate in active formal  physical therapies.  Her situation at home is such that her husband is  getting to undergo surgery next week by [place] as well and they do not have  someone to stay with him overnight, so therefore skilled rehabilitation  for short was discussed at length.  At this time bed offers are being  sent out in anticipating for a discharge to a skilled facility for short-  term rehabilitation over the next 1 to 2 weeks.   On today's exam, she was afebrile, vital signs stable.  Her incision was  clean and dry.  Neurovascularly she was intact.  She was doing extremely  well medically speaking and this is being dictated in anticipation of  discharge tomorrow.   LABORATORY DATA:  Show 2 units being transfused.  Admission labs show  her urine to clean.  Admission hemoglobin 11.8, she dropped to a low of  7.8 and was transfused with good sequelae to 10.1.  Radiology showed a  chest film on date December 05, 2006, with no active disease.  EKG showed  normal sinus rhythm.   CONDITION ON DISCHARGE:  Stable and improved.   DISCHARGE MEDICATIONS/PLAN:  The patient will be discharged to skilled  care rehabilitation facility for continued formal physical therapy.  Total knee replacement protocols, active range of motion, strengthening  and ambulation and gait training.  She may shower tomorrow and replace  clean dry dressing.  Dressing changes daily.  She is weightbearing as  tolerated.  Follow up in our office in 2 weeks.  Call for a time.  She  is on a regular diabetic diet.  She is also to resume the following  medications:  1. Zyprexa 10 mg q.h.s.  2. Toprol XL 25 mg daily.  3. Norvasc 10 mg daily.  4. Diovan 320 mg daily.  5. Hydrochlorothiazide 25 mg daily.  6. Amaryl 4 mg daily.  7. Vytorin 10/80 mg daily.  8. Protonix 80 mg q.h.s.   9. Rozerem 8 mg q.h.s.  10.Lithium carbonate 300 mg CR daily.  11.Coumadin per pharmacy protocol for a total of 3 weeks.  12.Actos 30 mg daily.  13.Januvia 50 mg daily.  14.Glucophage 1000 mg daily.  15.Enema and laxative of choice.  16.Colace 100 mg b.i.d.  17.Percocet 1 to 2 every 4 to 6 p.r.n. pain and/or Vicodin 1 to 2      every 4 to 6 p.r.n. pain with Robaxin 500 mg 1 every 8 p.r.n.      spasm.   CONDITION ON DISCHARGE:  Stable and improved.      Tracy A. Shuford, P.A.-C.     TAS/MEDQ  D:  12/16/2006  T:  12/16/2006  Job:  [id number]

## 2010-07-18 NOTE — Discharge Summary (Signed)
[name], [name]                   ACCOUNT NO.:  [id number]   MEDICAL RECORD NO.:  [id number]          PATIENT TYPE:  INP   LOCATION:  3029                         FACILITY:  [facility]   PHYSICIAN:  Hollice Espy, M.D.DATE OF BIRTH:  1929-06-16   DATE OF ADMISSION:  08/17/2008  DATE OF DISCHARGE:  08/24/2008                               DISCHARGE SUMMARY   This summary will cover events from August 18, 2008, to August 24, 2008.   PCP:  Dr. Donia Guiles.   PSYCHIATRIST:  Dr. Ellamae Sia.   CONSULTANTS ON THIS CASE:  Dr. Antonietta Breach, Psychiatry.   DISCHARGE DIAGNOSES:  1. Bipolar disorder with major depressive features.  2. Malnutrition secondary to decreased p.o. intake secondary to severe      depression.  3. Acute renal failure from dehydration, now resolved.  4. Chronic low back pain.  5. History of hyperlipidemia.  6. History of gastroesophageal reflux disease.  7. History of schizoaffective disorder.   DISCHARGE MEDICATIONS FOR THIS PATIENT:  Are as follows:  1. Norvasc 10 p.o. daily.  2. Metoprolol 25 p.o. daily.  3. Vytorin 10/80 p.o. daily.  4. Nexium 40 p.o. daily.  5. Rozerem 8 mg p.o. daily.  6. Diflucan 100 p.o. daily, discontinue after August 25, 2008.  7. Lithium 450 mg p.o. q.h.s.  8. Lopressor 25 p.o. b.i.d.  9. Remeron 15 mg p.o. q.h.s.  10.Zyprexa 15 mg p.o. q.h.s.  11.Protonix 40 p.o. daily.  12.Rozerem 8 p.o. q.h.s.  13.Effexor 112.5 p.o. daily.   HOSPITAL COURSE:  Patient is a 77 year old white female, past medical  history of bipolar disorder and GERD with features of severe depression  and psychosis who according to the family had wanted to take off her  lithium for quite some time.  With Psychiatry, she had cut back to half  a dose but then decided she did not need it altogether.  She had stopped  it herself several months ago.  Since that time, she has fallen worse  and went into a severe depressive and psychotic state.  Patient is  minimally responsive, sometimes sleeps day and night.  Sometimes she  just stares ahead, does not eat, does not take her medications.  Family  became concerned.  They brought the patient in.  Patient was admitted.  She was found to be in some mild acute renal failure.  She was hydrated  but again continued to take p.o.  Her BUN and creatinine improved;  however, she remained again minimally responsive.  Dr. Jeanie Sewer from  Psychiatry saw the patient.  He recommended starting her on Zyprexa, as  well as her lithium.  After several days, patient had some mild  improvement in that she started to take p.o.  She started to be slightly  verbally responsive and with maximal encouragement started to ambulate  with physical therapy.  Dr. Jeanie Sewer followed up on August 23, 2008, and  recommended continued treatment, as well as inpatient geropsych unit.  At this time, attempts are being made to find a geropsych unit.  In the  meantime, patient  has had some hypoglycemia secondary to poor p.o.  intake.  As patient's p.o. intake improves, her diabetic medications can  be resumed.  At this time, they are on hold.  Her diabetic medications  are as follows:  Glyburide 4 p.o. daily, Actos 45 p.o. daily, and  Januvia 50 mg p.o. daily.  Again, these can be continued slowly as the  patient does take more and more p.o. Patient is currently awaiting  geropsych unit.   DISPOSITION:  From initial presentation, is improved although she needs  continued psychiatric intervention.   ACTIVITY:  Will be as per PT/OT.   DISCHARGE DIET:  Will be a heart-healthy, carb-modified diet and she  will be discharged to a geropsych unit.      Hollice Espy, M.D.  Electronically Signed     SKK/MEDQ  D:  08/24/2008  T:  08/24/2008  Job:  [id number]   cc:   Antonietta Breach, M.D.  Daine Floras, M.D.  Donia Guiles, M.D.

## 2010-07-18 NOTE — Op Note (Signed)
NAMEMAGGI, HERSHKOWITZ                   ACCOUNT NO.:  [id number]   MEDICAL RECORD NO.:  [id number]          PATIENT TYPE:  INP   LOCATION:  3533                         FACILITY:  [facility]   PHYSICIAN:  Clydene Fake, M.D.  DATE OF BIRTH:  03-25-1929   DATE OF PROCEDURE:  01/06/2008  DATE OF DISCHARGE:                               OPERATIVE REPORT   PREOPERATIVE DIAGNOSES:  Lumbar stenosis, spondylosis, herniated nucleus  pulposus and L2-3 and L3-4.   POSTOPERATIVE DIAGNOSES:  Lumbar stenosis, spondylosis, herniated  nucleus pulposus and L2-3 and L3-4.   PROCEDURE:  Decompressive laminectomy, decompression of L2 and L3 roots  on the right side with diskectomy, decompressive laminectomy,  decompression of the L3 and L4 roots on the left side, microdissection  with microscope.   SURGEON:  Clydene Fake, MD   ASSISTANT:  Stefani Dama, MD   ANESTHESIA:  General endotracheal tube anesthesia.   ESTIMATED BLOOD LOSS:  Minimal.   BLOOD GIVEN:  None.   DRAINS:  None.   COMPLICATIONS:  None.   REASON FOR PROCEDURE:  The patient is a 77 year old woman who has been  having back and bilateral hip pain, worse on left, some right leg  weakness, and trouble walking.  An MRI was done showing multiple spinal  changes, require surgery L4-5 and L5-S1 and has scoliosis but also  moderate-to-severe stenosis, worse to the left side at L3-4, stenosis,  spinal changes, and disk herniation causing severe stenosis at L2-3.  The patient was brought in for decompression of the L2, 3, and 4 nerve  roots.   PROCEDURE IN DETAIL:  The patient was brought to the operating room, and  general anesthesia was induced.  The patient was placed in the prone  position on the Wilson frame and all pressure points were padded.  The  patient was prepped and draped in a sterile fashion.  The area of  incision injected with 10 mL of 1% lidocaine with epinephrine.  Incision  was then made in the midline over lumbar  spine from the top of the  previous incision going caudally upto interspaces.  Incision was taken  down to the fascia.  One the left side, subperiosteal dissection was  done over the L3 and L4 spinous process of lamina out to the facet.  Marker was placed at the interspace and needle was placed in the  interspace above.  An x-ray was obtained confirming our positioning at  the L2-3 with the needle in L3-4 with the marker.  Subperiosteal  dissection was done on the right-sided L2-3 for later decompression of  lamina there.  On left side at L3-4, self-retaining retractor was placed  exposing the L3-4 lamina.  High-speed drill was used to perform semi-  hemilaminectomy and was completed with Kerrison punches.  Microscope was  brought in for microdissection as we continued the diskectomy,  decompressing the central canal, and decompressing the nerve roots of L3  and L4 on the left side.  When we were finished, we had good  decompression.  We had good hemostasis with Gelfoam  and thrombin.  The  retractor was removed.  The right-sided L2-3 semi-hemilaminectomy was  then performed with high-speed drill and Kerrison punches and we used  microdissection with microscope for this dissection.  We explored the  epidural space and found large fragment of disk herniation compressing  into the thecal sac.  These were removed.  We were finished, we had good  decompression of the central canal.  Foraminotomies were done over the  L2 and L3 roots and explored the area.  We had good central  decompression and good decompression in L2 and L3 roots.  We had good  hemostasis with Gelfoam and thrombin.  This was then irrigated out.  We  irrigated with antibiotic solution to areas.  We had good hemostasis and  good decompression of the canal from L2 to L4.  Retractors were removed.  Fascia was closed with 0 Vicryl interrupted suture.  Subcutaneous tissue  closed with 0, 2-0, and 3-0 Vicryl interrupted sutures.   Skin closed  with benzoin and Steri-Strips.  Dressing was placed.  The patient was  placed back in supine position, awakened from anesthesia, and  transferred to recovery room in stable condition.           ______________________________  Clydene Fake, M.D.     JRH/MEDQ  D:  01/06/2008  T:  01/07/2008  Job:  [id number]

## 2010-07-21 NOTE — Op Note (Signed)
[place]. [facility]  Patient:    [name], [name] Visit Number: [id number] MRN: [id number]          Service Type: SUR Location: 3000 3023 01 Attending Physician:  Colon Branch Dictated by:   Clydene Fake, M.D. Admit Date:  04/30/2001                             Operative Report  PREOPERATIVE DIAGNOSIS:  Stenosis, spondylosis, herniated nucleus pulposus L4-5 and L5-S1.  PROCEDURE:  Decompressive bilateral laminectomies at L4-5 with diskectomies, left decompressive hemilaminectomy at L5-S1, microdissection with microscope.  SURGEON:  Clydene Fake, M.D.  ASSISTANT: _____ .  ANESTHESIA:  General endotracheal anesthesia.  ESTIMATED BLOOD LOSS:  None.  BLOOD GIVEN:  None.  DRAINS:  None.  COMPLICATIONS:  None.  INDICATIONS FOR PROCEDURE:  The patient is a 77 year old woman with a couple month history of progressive back and leg pain, worse on the left with neurogenic claudication on short distances.  MRI was done and showed severe stenosis at L4-5 due to the spondylosis and disk herniation and spondylosis at L5-S1 with possible left disk herniation causing left-sided stenosis.  The patient is brought in for decompression.  DESCRIPTION OF PROCEDURE:  The patient was brought into the operating room. General endotracheal anesthesia was induced.  The patient was placed in the prone position.  _____ pressure points padded.  The patient was prepped and draped in a sterile fashion.  A needle was placed in the interspace and x-ray was obtained showing this was at the L4-5 interspace.  Incision was then made at the L4-5 and L5-S1 interspaces.  Incision taken down to the fascia. Hemostasis obtained with Bovie cauterization.  On the left side the fascia was incised and the subperiosteal dissection was done over the L4, L5 and S1 spinous processes and lamina out to the facet.  Markers were placed in the L4-5 and L5-S1 interspaces.  X-ray was again  obtained confirming our positioning.  Fascia was then incised on the right side at L4-5 and subperiosteal dissection was done there.  Self-retaining retractor were then placed at the L4-5 interspace.  Microscope was brought in for microdissection at this point.  High-speed drill was used to perform a hemilaminectomy at L4-5 bilaterally.  This was completed with Kerrison punches and ligamentum flavum was removed.  Significant ligament hypertrophy.  The lateral ligament was removed and this decompressed the canal.  Attention was then taken to the disk space and large bilateral disk herniation and central disk herniation was seen.  Disk space was incised first on one side and diskectomy was performed with pituitary rongeurs and curets.  Disk space was incised on the opposite side.  We actually went back and forth, making sure we decompressed the central canal with this large central disk herniation.  After this, L4 and L5 roots were decompressed along with the central canal.  The wound was irrigated with antibiotic solution.  Attention was then taken to the left L5-S1 level where a high-speed drill was used to perform a hemilaminectomy and foraminotomy along with finishing this with the Kerrison punches.  Ligamentum flavum was removed, foraminotomy was done over the S1 root.  After decompressing central canal, we explored the disk space and it was a very spondylitic level with a spondylitic disk bulge and this was removed with osteophyte removers.  This was buried under the S1 root.  We could not enter the disk  space due to the amount of disk desiccation and space collapse.  There was no soft disk herniation.  We made the L5-S1 roots were decompressed.  The wound was irrigated with antibiotic solution. Gelfoam and thrombin was placed over the dura.  The incision was then irrigated with antibiotic solution.  Gelfoam and thrombin were removed.  A couple pieces were placed loosely over the dura  and then the fascia was closed with 0 Vicryl interrupted sutures.  The subcutaneous tissue was closed with 0, 2-0 and 3-0 Vicryl interrupted sutures and skin closed with Benzoin and Steri-Strips.  Dressing was placed.  The patient was placed back in the supine position, awakened by anesthesia and transferred to the recovery room in stable condition. Dictated by:   Clydene Fake, M.D. Attending Physician:  Colon Branch DD:  04/30/01 TD:  04/30/01 Job: [id number]

## 2010-07-21 NOTE — H&P (Signed)
[place]. [facility]  Patient:    [name], [name] Visit Number: [id number] MRN: [id number]          Service Type: SUR Location: 3000 3023 01 Attending Physician:  Colon Branch Dictated by:   Clydene Fake, M.D. Admit Date:  04/30/2001                           History and Physical  CHIEF COMPLAINT:  Leg pain.  HISTORY OF PRESENT ILLNESS:  The patient has a long history of back, hip, back pain and spondylosis of the lumbar spine.  For the last few months she has developed worsening left hip pain, worse when she is going up the stairs. Pain goes down the leg worse on the left than the right.  Sitting too long, standing or walking too far worsens things and she has to stop and rest after a short distance.  MRIs done showing severe stenosis of L4-5 with spondylosis and HNP causing the stenosis.  She also has a left 5-1 disk herniation.  The patient was brought in for decompression.  PAST MEDICAL HISTORY: 1. Significant for recent diagnosis of diabetes. 2. ______ problems.  PREVIOUS OPERATIONS: 1. Appendectomy at 77 years of age. 2. Tonsillectomy in the distant past.  MEDICATIONS: 1. Provigil. 2. Eskalith. 3. Zyprexa. 4. Celexa. 5. Pravachol. 6. Amaryl. 7. Diovan.  SOCIAL HISTORY:  The patient does not smoke or drink alcohol.  REVIEW OF SYSTEMS:  Otherwise negative.  FAMILY HISTORY:  Noncontributory.  PHYSICAL EXAMINATION:  HEENT:  Unremarkable.  NECK:  Supple.  LUNGS;  Clear.  HEART:  Regular rhythm.  ABDOMEN:  Soft, nontender.  EXTREMITIES:  Intact.  BACK AND NEUROLOGIC:  Range of motion of the back is done fairly well. Negative straight leg raise bilaterally.  Motor strength is intact.  Gait normal.  Sensation intact.  Does have tenderness to the left ______.  PLAN:  The patient with severe stenosis at 4-5 and the patient will be brought in for decompressive laminectomy at 4-5 and 5-1 and diskectomies. Dictated by:   Clydene Fake, M.D. Attending Physician:  Colon Branch DD:  04/30/01 TD:  04/30/01 Job: [id number]

## 2010-07-21 NOTE — Op Note (Signed)
NAMESHERRIAN, NUNNELLEY                   ACCOUNT NO.:  [id number]   MEDICAL RECORD NO.:  [id number]          PATIENT TYPE:  AMB   LOCATION:  ENDO                         [facility]ACILITY[facility]  [facility]   PHYSICIAN:  Danise Edge, M.D.   DATE OF BIRTH:  1929/09/06   DATE OF PROCEDURE:  07/17/2004  DATE OF DISCHARGE:                                 OPERATIVE REPORT   PROCEDURE:  Diagnostic esophagogastroduodenoscopy.   INDICATIONS:  Ms. Iron deficiency anemia Molenda is a 77 year old female born  Jan 12, 1930. Ms. Katrinka Blazing has chronic gastroesophageal reflux manifested by  heartburn which is well controlled on Nexium. She has type 2 diabetes  mellitus and takes aspirin 325 milligrams daily. She has chronic nausea  interspersed with episodes of vomiting greenish liquid. She has a bitter  taste in her mouth on occasions. She has intermittent nonbloody diarrhea but  reports no weight loss.   May 30, 2004 hemoglobin A1c 6.8%, complete metabolic profile normal except  elevated random glucose. CBC normal.   March 08, 2004 abdominal ultrasound revealed a dilated common bile duct to  the ampulla and probable fatty infiltration of the liver.   March 16, 2004 CT scan of the abdomen revealed mild central intrahepatic  biliary ductal dilation with increase in the dilation of the common bile  duct but no evidence of ampullary masses or pancreatic cancer.   March 18, 2004 MRI of the brain with and without contrast revealed no  acute intracranial abnormality. The patient underwent an MR cholangiogram  which did not reveal biliary stones. She did have a cyst in the pancreas.   On June 29, 2004,  she went under weight underwent an endoscopic ultrasound  at [facility] which revealed a simple pancreatic  cyst, dilated common bile duct, and saccular dilation of the main pancreatic  duct at the genu.  A diagnosis of papillary stenosis could not be excluded.  The patient's liver enzymes are  normal.   PAST MEDICAL HISTORY:  Colonic diverticulosis, hiatal hernia, bipolar  disorder, type 2 diabetes mellitus, hypertension, hyperlipidemia, tubal  ligation, appendectomy, back surgery and gastroesophageal reflux.   CHRONIC MEDICATIONS:  Pravachol, Zetia, metformin, Amaryl, Diovan, aspirin,  Imdur, Toprol, Zyrtec, Zyprexa,  lithium, Celexa, Provigil, Norvasc,  lorazepam calcium and atenolol.   ENDOSCOPIST:  Danise Edge, M.D.   PREMEDICATION:  Versed 6 mg, Demerol 50 mg.   DESCRIPTION OF PROCEDURE:  After obtaining informed consent, Ms. Kolbeck was  placed in the left lateral decubitus position. I administered intravenous  Demerol and intravenous Versed to achieve conscious sedation for the  procedure. The patient's blood pressure, oxygen saturation and cardiac  rhythm were monitored throughout the procedure and documented in the medical  record.   The Olympus gastroscope was passed through the posterior hypopharynx into  the proximal esophagus without difficulty. I did not visualize the vocal  cords.   ESOPHAGOSCOPY:  The proximal, mid, and lower segments of the esophageal  mucosa appeared normal. There is no endoscopic evidence for the presence of  erosive esophagitis or Barrett's esophagus.   GASTROSCOPY:  Retroflex view of the gastric cardia was normal. There are  scattered erosions (linear) in the gastric fundus, body and the gastric  antrum. There are no ulcers. The pylorus is patent. A biopsy was taken from  the distal gastric antrum for CLOtest to rule out Helicobacter pylori antral  gastritis.   DUODENOSCOPY:  The duodenal bulb and descending duodenum appeared normal.   ASSESSMENT:  Erosions in the gastric fundus, body and gastric antrum which  may be due to aspirin. A biopsy to rule out H pylori is pending.   I will meet with Ms. Chuba to go over her CLOtest and if this is normal  schedule her for a nuclear medicine gastric emptying study.      MJ/MEDQ   D:  07/17/2004  T:  07/17/2004  Job:  [id number]   cc:   Donia Guiles, M.D.  [address] [place]  [place] [address]  Fax: [phone]

## 2010-07-21 NOTE — Op Note (Signed)
[place]. [facility]  Patient:    [name], [name] Visit Number: [id number] MRN: [id number]          Service Type: SUR Location: 3000 3023 01 Attending Physician:  Colon Branch Dictated by:   Clydene Fake, M.D. Proc. Date: 04/30/01 Admit Date:  04/30/2001                             Operative Report  INCOMPLETE.   PREOPERATIVE DIAGNOSIS:  Lumbar stenosis, spondylosis and herniated nucleus pulposus, L4-5 and L5-S1.  POSTOPERATIVE DIAGNOSIS:   Lumbar stenosis, spondylosis and herniated nucleus pulposus L4-5 and L5-S1.  PROCEDURE:  Bilateral decompressive hemilaminectomy L4-5, diskectomy left L5-S1.  _____ .  SURGEON:  Clydene Fake, M.D.  ASSISTANT:  _____ .  ESTIMATED BLOOD LOSS:  Minimal.  _____ :  None.  DRAINS:  None.  COMPLICATIONS:  None.  INDICATIONS FOR PROCEDURE:   The patient is a 77 year old woman who has a significant amount of back pain over the years.  The last few months has been worsening with neurogenic claudication on just a short distance.  MRI Dictated by:   Clydene Fake, M.D. Attending Physician:  Colon Branch DD:  04/30/01 TD:  04/30/01 Job: [id number]

## 2010-07-21 NOTE — Op Note (Signed)
NAME:  [name], [name]                             ACCOUNT NO.:  [id number]   MEDICAL RECORD NO.:  [id number]                   PATIENT TYPE:  AMB   LOCATION:  ENDO                                 FACILITY:  [facility]   PHYSICIAN:  Anselmo Rod, M.D.               DATE OF BIRTH:  26-Sep-1929   DATE OF PROCEDURE:  04/21/2002  DATE OF DISCHARGE:                                 OPERATIVE REPORT   PROCEDURE:  Screening colonoscopy.   ENDOSCOPIST:  Anselmo Rod, M.D.   INSTRUMENT USED:  Olympus video colonoscope, switched to adjustable  pediatric scope.   INDICATION FOR PROCEDURE:  A 77 year old white female with a history of left  lower quadrant pain.  Has a history of diverticulosis.  Undergoing screening  colonoscopy to rule out colonic polyps, masses, etc.   PREPROCEDURE PREPARATION:  Informed consent was procured from the patient.  The patient fasted for eight hours prior to the procedure and prepped with  GoLYTELY the night prior to the procedure.   PREPROCEDURE PHYSICAL:  VITAL SIGNS:  The patient had stable vital signs.  NECK:  Supple.  CHEST:  Clear to auscultation.  S1, S2 regular.  ABDOMEN:  Soft with normal bowel sounds.   DESCRIPTION OF PROCEDURE:  The patient was placed in the left lateral  decubitus position and sedated with an additional 20 mg of Demerol and 2 mg  of Versed intravenously.  Once the patient was adequately sedate and  maintained on low-flow oxygen and continuous cardiac monitoring, the Olympus  video colonoscope was advanced from the rectum to the mid-rectosigmoid with  difficulty, and therefore the adult was withdrawn and the pediatric scope  was used instead.  There was extensive left-sided diverticulosis.  The  pediatric colonoscope was gradually maneuvered, and this procedure was  completed up to the cecum.  The appendiceal orifice and the ileocecal valve  were clearly visualized and photographed.  The IC valve was normal.  The  right colon and  transverse colon appeared normal without lesions.   IMPRESSION:  1. Extensive left-sided diverticulosis.  2. Normal-appearing transverse and left colon, including cecum.    RECOMMENDATIONS:  1. A high-fiber diet has been discussed with the patient in great detail,     and brochures have been given to the patient.  2. Outpatient follow-up is advised in the next two weeks or earlier if need     be.                                               Anselmo Rod, M.D.    JNM/MEDQ  D:  04/21/2002  T:  04/21/2002  Job:  [id number]   cc:   Marcelino Duster L. Vincente Poli, M.D.  [address], [address]  [place]  [place] [address]  Fax: [phone]

## 2010-07-21 NOTE — Op Note (Signed)
NAME:  [name], [name]                             ACCOUNT NO.:  [id number]   MEDICAL RECORD NO.:  [id number]                   PATIENT TYPE:  AMB   LOCATION:  [facility]                                 FACILITY:  [facility]   PHYSICIAN:  Jamison Neighbor, M.D.               DATE OF BIRTH:  27-Feb-1930   DATE OF PROCEDURE:  01/05/2003  DATE OF DISCHARGE:                                 OPERATIVE REPORT   SERVICE:  Urology.   PREOPERATIVE DIAGNOSES:  Chronic pelvic pain, possible interstitial  cystitis.   POSTOPERATIVE DIAGNOSES:  Chronic pelvic pain, possible interstitial  cystitis.   PROCEDURE:  Cystoscopy, urethral calibration, hydrodistention of the  bladder, Marcaine and Pyridium installation, Marcaine and Kenalog injection.   SURGEON:  Jamison Neighbor, M.D.   ANESTHESIA:  General.   COMPLICATIONS:  None.   DRAINS:  None.   BRIEF HISTORY:  This 77 year old female has a tentative diagnosis of  interstitial cystitis. The patient has the classic symptoms of urgency,  frequency and pain without other obvious source. The patient underwent  diagnostic cystoscopy with hydrodistention by Dr. Isabel Caprice in the past. I have  reviewed those operative notes and the patient had a reasonable bladder  capacity without much in the way of glomerulations. We do note, however,  that the patient has been empirically treated with interstitial cystitis  type therapy including Elmiron with some improvement. The patient has done  installation therapy and did not tolerate that well.   The patient's symptoms have returned and she has requested a repeat  hydrodistention. The understands that there is no guarantee that she will  have any improvement in her symptoms. I am willing to consider this because  the patient has previously responded to hydrodistention with some  therapeutic benefits. We will determine if there has been any improvement in  the appearance of her bladder and in addition hopefully elicit a  good  therapeutic response. The patient understands the risks and benefits of the  procedure and gave full and informed consent.   DESCRIPTION OF PROCEDURE:  After successful induction of general anesthesia,  the patient was placed in the dorsal lithotomy position, prepped with  Betadine and draped in the usual sterile fashion. Careful bimanual  examination revealed no abnormalities of the vaginal cuff. The patient is  status post hysterectomy. There is no enterocele, there is a very modest  cystocele and modest rectocele. There are no masses on bimanual exam, the  urethra was unremarkable. The urethra was calibrated to 73 [place] with  female urethral sounds with no evidence of stenosis or stricture. The  cystoscope was inserted, the bladder was carefully inspected and was free of  any tumor or stones. Both ureteral orifices were normal in configuration and  location. There was no evidence of squamous metaplasia or other mucosal  abnormality. Hydrodistention of the bladder was performed at 100 cm of water  for five minutes. When the bladder was drained, no glomerulations were seen,  no ulcers were identified, there was no bleeding on the drain out film.  Bladder capacity was slightly diminished at 850 mL but essentially the same  as what she had when Dr. Isabel Caprice performed with hydrodistention. This would  certainly indicate a very stable situation. The patient tolerated the  procedure well and was taken to the recovery room in good condition. We will  send her home with a prescription for pain medication as well as antibiotic  coverage with Macrobid. She has Pyridium plus already. We will wait until we  see what type of response she has from the hydrodistention. If the patient  continues to be symptomatic and does not have any response to the  hydrodistention, the most appropriate course of therapy is likely to be  discontinuation of the Elmiron and other interstitial cystitis type  therapy  and simply looking at empiric therapy with urinary analgesics and/or  anticholinergics. If on the other hand, the patient has a good clinical  response to the hydrodistention we would consider IC directed therapy even  without much in the way of glomerulations on cystoscopic exam.                                               Jamison Neighbor, M.D.    RJE/MEDQ  D:  01/05/2003  T:  01/05/2003  Job:  [id number]   cc:   Donia Guiles, M.D.  [address] [place]  [place] [address]  Fax: [phone]

## 2010-07-21 NOTE — Op Note (Signed)
   NAME:  [name], [name]                             ACCOUNT NO.:  [id number]   MEDICAL RECORD NO.:  [id number]                   PATIENT TYPE:  AMB   LOCATION:  ENDO                                 FACILITY:  [facility]   PHYSICIAN:  Anselmo Rod, M.D.               DATE OF BIRTH:  May 30, 1929   DATE OF PROCEDURE:  04/21/2002  DATE OF DISCHARGE:                                 OPERATIVE REPORT   PROCEDURE:  Esophagogastroduodenoscopy.   ENDOSCOPIST:  Anselmo Rod, M.D.   INSTRUMENT USED:  Olympus video panendoscope.   INDICATIONS FOR PROCEDURE:  Epigastric pain in a 77 year old white female,  rule out peptic ulcer disease.  This is also a female with a history of  abdominal epigastric pain, rule out peptic ulcer disease.   PREPROCEDURE PREPARATION:  Informed consent was procured from the patient.  The patient fasted for eight hours prior to the procedure.   PREPROCEDURE PHYSICAL:  The patient had stable vital signs. Neck supple.  Chest clear to auscultation. S1, S2 regular. Abdomen soft with normal bowel  sounds.   DESCRIPTION OF PROCEDURE:  The patient was placed in the left lateral  decubitus position, sedated with 80 mg of Demerol and 8 mg of Versed  intravenously. Once the patient was adequately sedated and maintained on low  flow oxygen and continuous cardiac monitoring, the Olympus video  panendoscope was advanced through the mouthpiece over the tongue into the  esophagus under direct vision. The entire esophagus appeared normal with no  evidence of ring, stricture, esophagitis, or Barrett's mucosa. The scope was  then advanced into the stomach. The entire gastric mucosa seemed slightly  erythematous consistent with mild gastritis. No ulcers, erosions, masses or  polyps were seen. There was no evidence of a hiatal hernia high  retroflexion. The duodenal bulb and the proximal small bowel appeared normal  and there was no ulcerative obstruction.   IMPRESSION:  Mild diffuse  gastritis, normal esophagogastroduodenoscopy.   RECOMMENDATIONS:  Proceed with a colonoscopy at this time.                                               Anselmo Rod, M.D.    JNM/MEDQ  D:  04/21/2002  T:  04/21/2002  Job:  [id number]

## 2010-07-21 NOTE — Discharge Summary (Signed)
[name], [name]                             ACCOUNT NO.:  [id number]   MEDICAL RECORD NO.:  [id number]                   PATIENT TYPE:  INP   LOCATION:  3713                                 FACILITY:  [facility]   PHYSICIAN:  Armanda Magic, M.D.                  DATE OF BIRTH:  1929/04/10   DATE OF ADMISSION:  04/16/2003  DATE OF DISCHARGE:  04/19/2003                                 DISCHARGE SUMMARY   ADMISSION DIAGNOSES:  1. Unstable angina.  2. Hypertension.  3. Non-insulin dependent diabetes mellitus.  4. Hyperlipidemia.   DISCHARGE DIAGNOSES:  1. Chest pain, negative for myocardial infarction.  2. Coronary artery disease, nonobstructive by cardiac catheterization on     April 19, 2003.  3. Normal left ventricular function.  4. Hypertension, controlled.  5. Hyperlipidemia on Statin therapy.  6. Non-insulin dependent diabetes mellitus, controlled.   PROCEDURE:  Left heart catheterization on April 19, 2003.  No  complications.   CONDITION ON DISCHARGE:  Stable/improved.   HISTORY OF PRESENT ILLNESS:  Please see complete H&P for details, but in  short, this is a 77 year old female with history of hypertension, GERD and  diabetes.  She presented to the emergency room on April 16, 2003, with  the complaint of mid sternal tightness with associated shortness of breath  and occasional palpitations.  Chest discomfort did not worsen with activity  and had no correlation to eating.  She denied any radiation of pain, nausea  or diaphoresis.  She presented to the office for further evaluation.  EKG  done showed new anterior ST and T wave changes since her last tracing in  1996.  She was complaining of some mild chest discomfort in the office on  the day of admission, hence prompting hospital admission for further  evaluation and treatment.   PHYSICAL EXAMINATION:  VITAL SIGNS:  Please see complete H&P, however, vital  signs were stable.  She was afebrile.  EKG as described  above.  Her physical exam was normal.   LABORATORY DATA AND X-RAY FINDINGS:  Admission labs including CBC, PT/PTT  and CMP were all normal with the exception of her hyperglycemia at 208.  Initial cardiac enzymes were all normal.   HOSPITAL COURSE:  The patient was admitted to rule out MI with serial EKG  and cardiac enzymes.  She was started on IV nitroglycerin and IV heparin.  Aspirin was added to her therapy.  She will be planned for cardiac  catheterization.  Metformin was placed on hold.  Fasting lipid panel was  checked.   She continued to complain of some mild chest pressure.  Repeat cardiac  enzymes were all normal.  EKG did not show any further changes.  She  remained in a normal sinus rhythm.  Vital signs were stable.  She was  complaining of a headache secondary to nitroglycerin.   On April 19, 2003, she was taken to the cardiac catheterization lab by  Dr. Mayford Knife.  Results showed normal LV function.  The left main was normal.  LAD had an area 40% proximally and 60% mid just after the take off of the  first diagonal.  Circumflex was disease free.  The RCA had area of 40% mid.   Of note, her chest x-ray did show a questionable 2 cm, pulmonary nodule on  the lateral view behind the heart.  Otherwise, she had no active disease.   The patient was discharged home later in the day on April 19, 2003,  without incident.   DISCHARGE MEDICATIONS:  1. Metformin 500 mg two tablets daily.  She was instructed not to resume     taking until Wednesday, April 21, 2003.  2. Nexium 40 mg daily.  3. Zetia 10 mg daily.  4. Pravachol 80 mg daily.  5. Diovan HCT 160/25 mg daily.  6. Norvasc 10 mg daily.  7. Aspirin 325 mg daily.  8. Zyprexa 2.5 mg at h.s.  9. Celexa 40 mg every night.  10.      Eskalith 450 mg daily.  11.      Elmiron 200 mg b.i.d.  12.      Provigil 100 mg daily.   ACTIVITY:  No strenuous activity for the next two days, i.e. no lifting more  than five pounds,  bending, stooping or straining.  She is not to drive  today, the day of discharge.   DIET:  Maintain low salt, low fat, low cholesterol diet as well as maintain  her diabetic diet restrictions.   WOUND CARE:  She may shower when she gets home and gently wash her right  groin catheterization site.  She was instructed not to soak in a bathtub for  the next several days.   FOLLOW UP:  She has an outpatient stress test set up at [facility] on  Friday, April 30, 2003, at 9:30 a.m.  The office will mail her  preparatory information.  She has an appointment to see Dr. Mayford Knife for  followup on Tuesday, May 04, 2003, at 11:45 a.m.      Adrian Saran, N.P.                        Armanda Magic, M.D.    HB/MEDQ  D:  05/04/2003  T:  05/05/2003  Job:  [id number]   cc:   Donia Guiles, M.D.  [address] [place]  [place] [address]  Fax: [phone]

## 2010-07-21 NOTE — Op Note (Signed)
NAME:  [name], [name]                             ACCOUNT NO.:  [id number]   MEDICAL RECORD NO.:  [id number]                   PATIENT TYPE:  AMB   LOCATION:  [facility]                                 [facility]ACILITY[facility]  [facility]   PHYSICIAN:  Valetta Fuller, M.D.               DATE OF BIRTH:  [birth year]   DATE OF PROCEDURE:  DATE OF DISCHARGE:                                 OPERATIVE REPORT   PREOPERATIVE DIAGNOSES:  1. Pelvic pain.  2. Frequency.  3. Possible interstitial cystitis.   POSTOPERATIVE DIAGNOSES:  1. Pelvic pain.  2. Frequency.  3. No evidence of interstitial cystitis.   PROCEDURE:  Cystoscopy with urethral dilation and hydraulic over distention  of the bladder.   SURGEON:  Valetta Fuller, M.D.   ANESTHESIA:  General.   INDICATIONS FOR PROCEDURE:  [name] is a 77 year old female. She has had  approximately three months of suprapubic pressure and a burning discomfort  within her pelvis and suprapubic area. She has intermittently had some  dysuria but also a burning sensation in her lower abdomen and vagina even  when she is not urinating. She has had some urinary frequency as often as  every hour but not really any significant nocturia. She has had no  hematuria. She has had multiple urinalyses which have been unremarkable  without evidence of pyuria or hematuria. Pelvic CT was also unremarkable  other than the slightly enlarged uterus. Pelvic exam showed mild cystocele  but no other abnormalities. We attempted to treat her with some  antispasmodic and antiburning medication but that did not seem to help her.  We felt that cystoscopy was indicated to rule out other pathology and also  to assess for the possibility of interstitial cystitis given her suprapubic  pressure, discomfort and frequency.   TECHNIQUE AND FINDINGS:  The patient was brought to the operating room where  she had successful induction of general anesthesia. Again she had some  atrophic vaginal change  with a mild cystocele. Urethral dilation was  performed to 11 [place] and there was no evidence of significant urethral  stenosis. The bladder was endoscopically normal without evidence of  inflammation or other mucosal abnormalities. She was then distended to  approximately  80 cm of water pressure for five minutes. The capacity was measured at 850  cc and I saw no evidence of any glomerular bleeding whatsoever. I did not  feel that there was evidence endoscopically of interstitial cystitis. I saw  no other obvious urologic cause for her complaints. The patient was brought  to the recovery room in stable condition.                                                Valetta Fuller, M.D.  DSG/MEDQ  D:  12/31/2001  T:  12/31/2001  Job:  [id number]   cc:   Donia Guiles, MD  [address] [place]  [place] [address]  Fax: [phone]

## 2010-07-21 NOTE — Cardiovascular Report (Signed)
[name], BOORD                             ACCOUNT NO.:  [id number]   MEDICAL RECORD NO.:  [id number]                   PATIENT TYPE:  INP   LOCATION:  3713                                 FACILITY:  [facility]   PHYSICIAN:  Armanda Magic, M.D.                  DATE OF BIRTH:  23-Dec-1929   DATE OF PROCEDURE:  04/19/2003  DATE OF DISCHARGE:  04/19/2003                              CARDIAC CATHETERIZATION   REFERRING PHYSICIAN:  Donia Guiles, M.D.   PROCEDURES PERFORMED:  1. Left heart catheterization.  2. Coronary angiography.  3. Left ventriculography.   CARDIOLOGIST:  Armanda Magic, M.D.   INDICATIONS:  Chest pain.   COMPLICATIONS:  None.   ACCESS:  IV access was via right femoral artery, 6 French sheath.   HISTORY:  This is a 77 year old female who was admitted with chest pain and  ruled out for myocardial infarction.  She did have some anterior T wave  changes and now presents for cardiac cath.   DESCRIPTION OF PROCEDURE:  The patient was brought to the cardiac  catheterization laboratory in the fasting nonsedated state.  Informed  consent was obtained.  The patient connected to continuous heart rate and  pulse oximetry monitoring, and intermittent blood pressure monitoring.  The  right groin was prepped and draped in a sterile fashion.  One percent  Xylocaine was used for local anesthesia.  Using the modified Seldinger  technique a 6 French sheath was placed in the right femoral artery.  Under  fluoroscopic guidance a 6 French JL-4 catheter was placed in the left  coronary artery.  Multi[le cine films were taken in 30-degree RAO and 40-  degree LAO views.  This catheter was then exchanged out over a guidewire for  a 6 [place] JR-4 catheter, which was placed under fluoroscopic guidance in  the right coronary artery.  Multiple cine films were taken at 30-degree RAO  and 40-degree LAO views.  This catheter was then exchanged out over a  guidewire for a 6 French angled  pigtail catheter, which was placed in the  left ventricular cavity under fluoroscopic guidance.  Left ventriculography  was performed in 30-degree RAO views with a total of 30 mL of contrast at 15  mL per second.  The catheter was then pulled back across the aortic valve  with no significant gradient noted.   At the end of the procedure all catheters and sheaths were removed.  Manual  compression was performed until adequate hemostasis was obtained.  The  patient was transferred back to her room in stable condition.   RESULTS:  Left Main Coronary Artery:  The left main coronary artery is  widely patent.  It trifurcates into the left anterior descending artery,  ramus artery and left circumflex.   Left Anterior Descending Artery:  The left anterior descending artery has a  40% proximal narrowing before the takeoff  of the first diagonal.  The first  diagonal is a very large vessel, which branches into two daughter branches;  all of which are widely patent.  Just distal to the takeoff of the first  diagonal there is a 60% narrowing of the LAD.  The LAD then is widely patent  throughout its course to the apex.  The ramus branch is widely patent.   Left Circumflex:  The left circumflex is widely patent throughout its course  and it is a large vessel.  It gives rise to a first obtuse marginal branch,  which is very large. It bifurcates distally in the two daughter branches.  The ongoing left circumflex gives off a second obtuse marginal branch, which  is widely patent as well.   Right Coronary Artery:  The right coronary artery has a proximal long  diffuse 40% narrowing.  It bifurcates distally into the posterior descending  artery and posterolateral artery, both of which are widely patent.   VENTRICULOGRAPHIC DATA:  Left Ventriculography:  Left ventriculography  showed normal LV systolic function.  Left ventricular pressure 156/13 mmHg.  Aortic pressure 155/67 mmHg.  LVEDP 21 mmHg.    ASSESSMENT:  1. One-vessel borderline atherosclerotic coronary artery disease.  2. Normal left ventricular function.  3. Chest pain of questionable etiology.   PLAN:  1. IV fluids and bedrest times six hours.  2. No metformin for 48 hours.  3. Continue Protonix as an outpatient.  4. Continue lipid lowering drugs.  Her lipid panel currently is excellent.  5. Continue aspirin 325 mg a day.  6. The patient will follow up with me in two weeks.                                               Armanda Magic, M.D.    TT/MEDQ  D:  06/26/2003  T:  06/27/2003  Job:  [id number]   cc:   Donia Guiles, M.D.  [address] [place]  [place] [address]  Fax: [phone]

## 2010-08-31 ENCOUNTER — Encounter: Payer: Self-pay | Admitting: Podiatry

## 2010-12-05 LAB — CBC
Hemoglobin: 12.3
Platelets: 224
RDW: 13.5

## 2010-12-05 LAB — BASIC METABOLIC PANEL
BUN: 19
Calcium: 9.6
GFR calc non Af Amer: 48 — ABNORMAL LOW
Glucose, Bld: 86
Sodium: 142

## 2010-12-05 LAB — URINALYSIS, ROUTINE W REFLEX MICROSCOPIC
Protein, ur: NEGATIVE
Urobilinogen, UA: 1

## 2010-12-05 LAB — PROTIME-INR: INR: 1

## 2010-12-05 LAB — URINE MICROSCOPIC-ADD ON

## 2010-12-05 LAB — APTT: aPTT: 29

## 2010-12-06 LAB — GLUCOSE, CAPILLARY
Glucose-Capillary: 112 — ABNORMAL HIGH
Glucose-Capillary: 134 — ABNORMAL HIGH
Glucose-Capillary: 215 — ABNORMAL HIGH

## 2010-12-14 LAB — CBC
HCT: 23.2 — ABNORMAL LOW
Hemoglobin: 7.8 — CL
MCHC: 33.3
MCHC: 33.4
MCV: 87.8
MCV: 87.9
Platelets: 138 — ABNORMAL LOW
Platelets: 160
Platelets: 173
RBC: 2.72 — ABNORMAL LOW
RBC: 3.45 — ABNORMAL LOW
RBC: 4.05
RDW: 13.5
RDW: 14.1 — ABNORMAL HIGH
WBC: 10.8 — ABNORMAL HIGH
WBC: 7.8

## 2010-12-14 LAB — URINALYSIS, ROUTINE W REFLEX MICROSCOPIC
Ketones, ur: NEGATIVE
Nitrite: NEGATIVE
Protein, ur: NEGATIVE
Urobilinogen, UA: 1

## 2010-12-14 LAB — DIFFERENTIAL
Eosinophils Relative: 3
Lymphocytes Relative: 39
Lymphs Abs: 2.9
Neutro Abs: 3.6
Neutrophils Relative %: 49

## 2010-12-14 LAB — BASIC METABOLIC PANEL
BUN: 23
BUN: 24 — ABNORMAL HIGH
Calcium: 8.4
Calcium: 8.7
Creatinine, Ser: 1.28 — ABNORMAL HIGH
GFR calc Af Amer: 49 — ABNORMAL LOW
GFR calc non Af Amer: 41 — ABNORMAL LOW
Potassium: 4.6
Sodium: 138

## 2010-12-14 LAB — APTT: aPTT: 31

## 2010-12-14 LAB — COMPREHENSIVE METABOLIC PANEL
AST: 18
BUN: 27 — ABNORMAL HIGH
CO2: 29
Calcium: 9.4
Creatinine, Ser: 1.41 — ABNORMAL HIGH
GFR calc Af Amer: 44 — ABNORMAL LOW
GFR calc non Af Amer: 36 — ABNORMAL LOW

## 2010-12-14 LAB — CROSSMATCH
ABO/RH(D): A POS
Antibody Screen: NEGATIVE

## 2010-12-14 LAB — PROTIME-INR
INR: 1.1
INR: 1.2
INR: 1.7 — ABNORMAL HIGH
INR: 3 — ABNORMAL HIGH
Prothrombin Time: 13.9
Prothrombin Time: 15.6 — ABNORMAL HIGH
Prothrombin Time: 25.1 — ABNORMAL HIGH
Prothrombin Time: 32.1 — ABNORMAL HIGH

## 2010-12-18 LAB — BASIC METABOLIC PANEL
BUN: 18
CO2: 31
Calcium: 9.2
Chloride: 105
Creatinine, Ser: 1.18

## 2010-12-19 LAB — CBC
HCT: 31.3 — ABNORMAL LOW
Hemoglobin: 10.5 — ABNORMAL LOW
MCHC: 33.5
MCV: 88.2
Platelets: 321
RDW: 14.9 — ABNORMAL HIGH

## 2010-12-19 LAB — DIFFERENTIAL
Basophils Absolute: 0
Basophils Relative: 0
Eosinophils Absolute: 0.1
Eosinophils Relative: 2
Monocytes Absolute: 0.8 — ABNORMAL HIGH

## 2010-12-19 LAB — POCT I-STAT CREATININE: Creatinine, Ser: 1.6 — ABNORMAL HIGH

## 2010-12-19 LAB — I-STAT 8, (EC8 V) (CONVERTED LAB)
Bicarbonate: 24.7 — ABNORMAL HIGH
Chloride: 105
HCT: 32 — ABNORMAL LOW
TCO2: 26
pCO2, Ven: 41.9 — ABNORMAL LOW
pH, Ven: 7.379 — ABNORMAL HIGH

## 2013-07-01 ENCOUNTER — Other Ambulatory Visit: Payer: Self-pay | Admitting: Internal Medicine

## 2013-07-01 ENCOUNTER — Encounter (INDEPENDENT_AMBULATORY_CARE_PROVIDER_SITE_OTHER): Payer: Self-pay

## 2013-07-01 ENCOUNTER — Ambulatory Visit
Admission: RE | Admit: 2013-07-01 | Discharge: 2013-07-01 | Disposition: A | Discharge status: Home / Self Care | Payer: Medicare Other | Source: Ambulatory Visit | Attending: Internal Medicine | Admitting: Internal Medicine

## 2013-07-01 DIAGNOSIS — R0600 Dyspnea, unspecified: Secondary | ICD-10-CM

## 2013-12-16 ENCOUNTER — Ambulatory Visit
Admission: RE | Admit: 2013-12-16 | Discharge: 2013-12-16 | Disposition: A | Discharge status: Home / Self Care | Payer: Medicare Other | Source: Ambulatory Visit | Attending: Nurse Practitioner | Admitting: Nurse Practitioner

## 2013-12-16 ENCOUNTER — Other Ambulatory Visit: Payer: Self-pay | Admitting: Nurse Practitioner

## 2013-12-16 DIAGNOSIS — R0989 Other specified symptoms and signs involving the circulatory and respiratory systems: Secondary | ICD-10-CM

## 2013-12-30 ENCOUNTER — Ambulatory Visit (HOSPITAL_COMMUNITY)
Admission: RE | Admit: 2013-12-30 | Discharge: 2013-12-30 | Disposition: A | Discharge status: Home / Self Care | Payer: Medicare Other | Source: Ambulatory Visit | Attending: Vascular Surgery | Admitting: Vascular Surgery

## 2013-12-30 ENCOUNTER — Other Ambulatory Visit (HOSPITAL_COMMUNITY): Payer: Self-pay | Admitting: Nurse Practitioner

## 2013-12-30 DIAGNOSIS — E13628 Other specified diabetes mellitus with other skin complications: Secondary | ICD-10-CM

## 2014-07-18 ENCOUNTER — Inpatient Hospital Stay (HOSPITAL_COMMUNITY)
Admission: EM | Admit: 2014-07-18 | Discharge: 2014-07-22 | DRG: 293 | Disposition: A | Discharge status: Home / Self Care | Payer: Medicare Other | Attending: Internal Medicine | Admitting: Internal Medicine

## 2014-07-18 ENCOUNTER — Encounter (HOSPITAL_COMMUNITY): Payer: Self-pay | Admitting: Nurse Practitioner

## 2014-07-18 ENCOUNTER — Emergency Department (HOSPITAL_COMMUNITY): Payer: Medicare Other

## 2014-07-18 DIAGNOSIS — I129 Hypertensive chronic kidney disease with stage 1 through stage 4 chronic kidney disease, or unspecified chronic kidney disease: Secondary | ICD-10-CM | POA: Diagnosis present

## 2014-07-18 DIAGNOSIS — N3010 Interstitial cystitis (chronic) without hematuria: Secondary | ICD-10-CM | POA: Diagnosis present

## 2014-07-18 DIAGNOSIS — I5031 Acute diastolic (congestive) heart failure: Secondary | ICD-10-CM | POA: Diagnosis present

## 2014-07-18 DIAGNOSIS — M545 Low back pain: Secondary | ICD-10-CM | POA: Diagnosis present

## 2014-07-18 DIAGNOSIS — N183 Chronic kidney disease, stage 3 unspecified: Secondary | ICD-10-CM | POA: Diagnosis present

## 2014-07-18 DIAGNOSIS — K219 Gastro-esophageal reflux disease without esophagitis: Secondary | ICD-10-CM | POA: Diagnosis present

## 2014-07-18 DIAGNOSIS — G5601 Carpal tunnel syndrome, right upper limb: Secondary | ICD-10-CM | POA: Diagnosis present

## 2014-07-18 DIAGNOSIS — M797 Fibromyalgia: Secondary | ICD-10-CM | POA: Diagnosis present

## 2014-07-18 DIAGNOSIS — F329 Major depressive disorder, single episode, unspecified: Secondary | ICD-10-CM | POA: Diagnosis present

## 2014-07-18 DIAGNOSIS — M961 Postlaminectomy syndrome, not elsewhere classified: Secondary | ICD-10-CM | POA: Diagnosis present

## 2014-07-18 DIAGNOSIS — E1165 Type 2 diabetes mellitus with hyperglycemia: Secondary | ICD-10-CM | POA: Diagnosis present

## 2014-07-18 DIAGNOSIS — I509 Heart failure, unspecified: Secondary | ICD-10-CM | POA: Insufficient documentation

## 2014-07-18 DIAGNOSIS — D510 Vitamin B12 deficiency anemia due to intrinsic factor deficiency: Secondary | ICD-10-CM | POA: Diagnosis present

## 2014-07-18 DIAGNOSIS — R072 Precordial pain: Secondary | ICD-10-CM

## 2014-07-18 DIAGNOSIS — G8929 Other chronic pain: Secondary | ICD-10-CM | POA: Diagnosis present

## 2014-07-18 DIAGNOSIS — Z794 Long term (current) use of insulin: Secondary | ICD-10-CM | POA: Diagnosis not present

## 2014-07-18 DIAGNOSIS — H3530 Unspecified macular degeneration: Secondary | ICD-10-CM | POA: Diagnosis present

## 2014-07-18 DIAGNOSIS — E876 Hypokalemia: Secondary | ICD-10-CM | POA: Diagnosis present

## 2014-07-18 DIAGNOSIS — E785 Hyperlipidemia, unspecified: Secondary | ICD-10-CM | POA: Diagnosis present

## 2014-07-18 DIAGNOSIS — I10 Essential (primary) hypertension: Secondary | ICD-10-CM | POA: Diagnosis not present

## 2014-07-18 DIAGNOSIS — Z885 Allergy status to narcotic agent status: Secondary | ICD-10-CM

## 2014-07-18 DIAGNOSIS — I2510 Atherosclerotic heart disease of native coronary artery without angina pectoris: Secondary | ICD-10-CM | POA: Diagnosis present

## 2014-07-18 DIAGNOSIS — M8580 Other specified disorders of bone density and structure, unspecified site: Secondary | ICD-10-CM | POA: Diagnosis present

## 2014-07-18 DIAGNOSIS — R079 Chest pain, unspecified: Secondary | ICD-10-CM

## 2014-07-18 DIAGNOSIS — M79609 Pain in unspecified limb: Secondary | ICD-10-CM | POA: Diagnosis not present

## 2014-07-18 DIAGNOSIS — I5032 Chronic diastolic (congestive) heart failure: Secondary | ICD-10-CM

## 2014-07-18 HISTORY — DX: Chest pain, unspecified: R07.9

## 2014-07-18 LAB — BASIC METABOLIC PANEL
ANION GAP: 8 (ref 5–15)
BUN: 12 mg/dL (ref 6–20)
CHLORIDE: 106 mmol/L (ref 101–111)
CO2: 27 mmol/L (ref 22–32)
Calcium: 9.4 mg/dL (ref 8.9–10.3)
Creatinine, Ser: 0.81 mg/dL (ref 0.44–1.00)
GLUCOSE: 171 mg/dL — AB (ref 65–99)
Potassium: 3.7 mmol/L (ref 3.5–5.1)
SODIUM: 141 mmol/L (ref 135–145)

## 2014-07-18 LAB — CBC
HCT: 39.8 % (ref 36.0–46.0)
Hemoglobin: 12.8 g/dL (ref 12.0–15.0)
MCH: 27.6 pg (ref 26.0–34.0)
MCHC: 32.2 g/dL (ref 30.0–36.0)
MCV: 86 fL (ref 78.0–100.0)
PLATELETS: 187 10*3/uL (ref 150–400)
RBC: 4.63 MIL/uL (ref 3.87–5.11)
RDW: 12.7 % (ref 11.5–15.5)
WBC: 7.3 10*3/uL (ref 4.0–10.5)

## 2014-07-18 LAB — I-STAT TROPONIN, ED: TROPONIN I, POC: 0 ng/mL (ref 0.00–0.08)

## 2014-07-18 LAB — TROPONIN I: Troponin I: <0.03 ng/mL (ref <0.031)

## 2014-07-18 LAB — GLUCOSE, CAPILLARY
GLUCOSE-CAPILLARY: 102 mg/dL — AB (ref 65–99)
Glucose-Capillary: 197 mg/dL — ABNORMAL HIGH (ref 65–99)

## 2014-07-18 LAB — BRAIN NATRIURETIC PEPTIDE: B NATRIURETIC PEPTIDE 5: 250.6 pg/mL — AB (ref 0.0–100.0)

## 2014-07-18 MED ORDER — LITHIUM CARBONATE 150 MG PO CAPS
150.0000 mg | ORAL_CAPSULE | Freq: Two times a day (BID) | ORAL | Status: DC
  Administered 2014-07-18 – 2014-07-20 (×4): 150 mg via ORAL
  Filled 2014-07-18 (×7): qty 1

## 2014-07-18 MED ORDER — VILAZODONE HCL 20 MG PO TABS
20.0000 mg | ORAL_TABLET | Freq: Every day | ORAL | Status: DC
  Administered 2014-07-20 – 2014-07-22 (×3): 20 mg via ORAL
  Filled 2014-07-18 (×4): qty 1

## 2014-07-18 MED ORDER — SODIUM CHLORIDE 0.9 % IJ SOLN: 3.0000 mL | INTRAMUSCULAR | Status: DC | PRN

## 2014-07-18 MED ORDER — FUROSEMIDE 10 MG/ML IJ SOLN
40.0000 mg | Freq: Two times a day (BID) | INTRAMUSCULAR | Status: DC
Start: 2014-07-18 — End: 2014-07-20
  Administered 2014-07-18 – 2014-07-19 (×3): 40 mg via INTRAVENOUS
  Filled 2014-07-18 (×6): qty 4

## 2014-07-18 MED ORDER — INSULIN ASPART 100 UNIT/ML ~~LOC~~ SOLN
0.0000 [IU] | Freq: Three times a day (TID) | SUBCUTANEOUS | Status: DC
  Administered 2014-07-19 (×2): 2 [IU] via SUBCUTANEOUS
  Administered 2014-07-19: 3 [IU] via SUBCUTANEOUS
  Administered 2014-07-20 – 2014-07-21 (×4): 2 [IU] via SUBCUTANEOUS
  Administered 2014-07-21: 3 [IU] via SUBCUTANEOUS
  Administered 2014-07-21 – 2014-07-22 (×3): 2 [IU] via SUBCUTANEOUS

## 2014-07-18 MED ORDER — SODIUM CHLORIDE 0.9 % IJ SOLN
3.0000 mL | Freq: Two times a day (BID) | INTRAMUSCULAR | Status: DC
  Administered 2014-07-19 – 2014-07-22 (×8): 3 mL via INTRAVENOUS

## 2014-07-18 MED ORDER — HYDRALAZINE HCL 20 MG/ML IJ SOLN
10.0000 mg | Freq: Four times a day (QID) | INTRAMUSCULAR | Status: DC | PRN
  Administered 2014-07-18: 10 mg via INTRAVENOUS
  Filled 2014-07-18: qty 1

## 2014-07-18 MED ORDER — NITROGLYCERIN 0.4 MG SL SUBL
0.4000 mg | SUBLINGUAL_TABLET | SUBLINGUAL | Status: DC | PRN
  Administered 2014-07-18 (×2): 0.4 mg via SUBLINGUAL
  Filled 2014-07-18: qty 1

## 2014-07-18 MED ORDER — PANTOPRAZOLE SODIUM 40 MG PO TBEC
80.0000 mg | DELAYED_RELEASE_TABLET | Freq: Every day | ORAL | Status: DC
  Administered 2014-07-18 – 2014-07-22 (×5): 80 mg via ORAL
  Filled 2014-07-18 (×5): qty 2

## 2014-07-18 MED ORDER — PREGABALIN 25 MG PO CAPS
50.0000 mg | ORAL_CAPSULE | Freq: Two times a day (BID) | ORAL | Status: DC
  Administered 2014-07-19 – 2014-07-22 (×8): 50 mg via ORAL
  Filled 2014-07-18 (×8): qty 2

## 2014-07-18 MED ORDER — TRAMADOL HCL 50 MG PO TABS
50.0000 mg | ORAL_TABLET | Freq: Two times a day (BID) | ORAL | Status: DC | PRN
  Administered 2014-07-21 – 2014-07-22 (×3): 50 mg via ORAL
  Filled 2014-07-18 (×3): qty 1

## 2014-07-18 MED ORDER — RISPERIDONE 0.5 MG PO TABS
0.5000 mg | ORAL_TABLET | Freq: Every evening | ORAL | Status: DC
  Administered 2014-07-19 – 2014-07-21 (×4): 0.5 mg via ORAL
  Filled 2014-07-18 (×5): qty 1

## 2014-07-18 MED ORDER — SODIUM CHLORIDE 0.9 % IV SOLN: 250.0000 mL | INTRAVENOUS | Status: DC | PRN

## 2014-07-18 MED ORDER — ENOXAPARIN SODIUM 40 MG/0.4ML ~~LOC~~ SOLN
40.0000 mg | SUBCUTANEOUS | Status: DC
  Administered 2014-07-18 – 2014-07-19 (×2): 40 mg via SUBCUTANEOUS
  Filled 2014-07-18 (×3): qty 0.4

## 2014-07-18 MED ORDER — ZOLPIDEM TARTRATE 5 MG PO TABS
5.0000 mg | ORAL_TABLET | Freq: Every day | ORAL | Status: DC
  Administered 2014-07-19 – 2014-07-21 (×4): 5 mg via ORAL
  Filled 2014-07-18 (×4): qty 1

## 2014-07-18 MED ORDER — VITAMIN D3 25 MCG (1000 UNIT) PO TABS
2000.0000 [IU] | ORAL_TABLET | ORAL | Status: DC
  Administered 2014-07-19 – 2014-07-22 (×4): 2000 [IU] via ORAL
  Filled 2014-07-18 (×5): qty 2

## 2014-07-18 MED ORDER — LISINOPRIL 10 MG PO TABS
10.0000 mg | ORAL_TABLET | Freq: Every evening | ORAL | Status: DC
  Administered 2014-07-18 – 2014-07-20 (×3): 10 mg via ORAL
  Filled 2014-07-18 (×4): qty 1

## 2014-07-18 MED ORDER — ONDANSETRON HCL 4 MG/2ML IJ SOLN: 4.0000 mg | Freq: Four times a day (QID) | INTRAMUSCULAR | Status: DC | PRN

## 2014-07-18 MED ORDER — ACETAMINOPHEN 325 MG PO TABS
650.0000 mg | ORAL_TABLET | ORAL | Status: DC | PRN
  Administered 2014-07-19 (×2): 650 mg via ORAL
  Filled 2014-07-18 (×2): qty 2

## 2014-07-18 MED ORDER — LITHIUM CARBONATE ER 300 MG PO TBCR: 150.0000 mg | EXTENDED_RELEASE_TABLET | ORAL | Status: DC

## 2014-07-18 MED ORDER — ASPIRIN 81 MG PO CHEW
324.0000 mg | CHEWABLE_TABLET | Freq: Once | ORAL | Status: AC
  Administered 2014-07-18: 324 mg via ORAL
  Filled 2014-07-18: qty 4

## 2014-07-18 MED ORDER — INSULIN ASPART 100 UNIT/ML ~~LOC~~ SOLN
0.0000 [IU] | Freq: Every day | SUBCUTANEOUS | Status: DC
  Administered 2014-07-19: 2 [IU] via SUBCUTANEOUS

## 2014-07-18 MED ORDER — ATORVASTATIN CALCIUM 10 MG PO TABS
10.0000 mg | ORAL_TABLET | ORAL | Status: DC
  Administered 2014-07-19 – 2014-07-22 (×4): 10 mg via ORAL
  Filled 2014-07-18 (×5): qty 1

## 2014-07-18 MED ORDER — HYPROMELLOSE (GONIOSCOPIC) 2.5 % OP SOLN
2.0000 [drp] | Freq: Four times a day (QID) | OPHTHALMIC | Status: DC | PRN
  Administered 2014-07-19 – 2014-07-21 (×2): 2 [drp] via OPHTHALMIC
  Filled 2014-07-18: qty 15

## 2014-07-18 MED ORDER — METOPROLOL TARTRATE 12.5 MG HALF TABLET
12.5000 mg | ORAL_TABLET | Freq: Two times a day (BID) | ORAL | Status: DC
  Filled 2014-07-18: qty 1

## 2014-07-18 MED ORDER — MIRTAZAPINE 30 MG PO TABS
30.0000 mg | ORAL_TABLET | Freq: Every evening | ORAL | Status: DC
  Administered 2014-07-19 – 2014-07-21 (×4): 30 mg via ORAL
  Filled 2014-07-18 (×5): qty 1

## 2014-07-18 MED ORDER — CARVEDILOL 6.25 MG PO TABS
6.2500 mg | ORAL_TABLET | Freq: Two times a day (BID) | ORAL | Status: DC
  Administered 2014-07-19 – 2014-07-20 (×4): 6.25 mg via ORAL
  Filled 2014-07-18 (×6): qty 1

## 2014-07-18 NOTE — ED Notes (Signed)
Pt reports "spells" of SOB and pain in her chest radiating into her back since last week. The symptoms became worse last night and have been more severe. She states the pain is worse with inspiration. Nothing relieves the pain. Denies cough but felt chills this am

## 2014-07-18 NOTE — ED Notes (Signed)
States chest is "tight" now, after 2 NTG. Still 6/10

## 2014-07-18 NOTE — ED Notes (Signed)
Report given to Vickii Chafe, RN on 3E

## 2014-07-18 NOTE — H&P (Signed)
History and Physical  [name] VQQ:[id number] DOB: 12-21-1929 DOA: 07/18/2014   PCP: Irven Shelling, MD  Referring Physician: ED/ Dr. Doy Mince  Chief Complaint: chest pain and sob  HPI:  81 year old female with a history of diabetes mellitus, coronary artery disease, hyperlipidemia, presented with 1 month history of intermittent chest discomfort and shortness of breath. She states that her chest discomfort is usually substernal in nature and occurs at rest without any other exacerbating or alleviating factors. She has had some dyspnea on exertion, but her complaint of shortness of breath occurs when she wakes up at night with dyspnea. She denies any fevers, chills, coughing, hemoptysis, nausea, vomiting, diarrhea, abdominal pain. At baseline, the patient uses a walker to ambulate, and she has noted some increasing dyspnea with exertion getting around. The patient has also noted lower extremity edema that has been present for the better part of one month, but it has not significantly worsened since that time. Unfortunately, the patient is a rather poor historian, and this history is mostly obtained from the patient's husband and daughter at the bedside. Because of the patient's chest tightness and shortness of breath had become more frequent, she presented to the emergency department.  In the emergency department, patient had oxygen saturation 94-96% on room air, but she was afebrile and hemodynamically stable. She was quite hypertensive at the time of presentation with a blood pressure of 206/58. It did improve to 176/67 with giving Avapro. BMP was unremarkable as was the CBC. BNP was 250. EKG showed sinus rhythm with lots of artifact and nonspecific ST changes. Chest x-ray showed prominent stable vascular markings Assessment/Plan: Acute CHF -The patient's clinical presentation and history are consistent with fluid overload -Start intravenous furosemide 40 mg IV twice a day  -Patient has  requested Foley catheter placement due to aggressive diuresis  -Daily weights  -Strict I's and O's  -Echocardiogram  Atypical chest discomfort  -Cycle troponins  -EKG with nonspecific ST changes  Uncontrolled hypertension  -Continue lisinopril  -She is metoprolol tartrate to carvedilol  -Hydralazine when necessary SBP >180 Hyperlipidemia  -Continue Lipitor  Diabetes mellitus type 2  -Hemoglobin A1c  -NovoLog sliding scale  Lower extremity pain and edema  -Venous duplex for rule out DVT  Depression/fibromyalgia/Bipolar -Continue Lyrica  -Continue lithium  -Continue vilazodone -continue remeron        Past Medical History  Diagnosis Date  . Diabetes mellitus without complication   . Hypertension   . CKD (chronic kidney disease), stage III   . Coronary artery disease   . PVC (premature ventricular contraction)   . Hyperlipidemia   . Dyslipidemia   . GERD (gastroesophageal reflux disease)   . Osteopenia   . Chronic low back pain   . Postlaminectomy syndrome   . Right carpal tunnel syndrome   . Pernicious anemia   . Chronic interstitial cystitis   . Microhematuria   . Macular degeneration    History reviewed. No pertinent past surgical history. Social History:  reports that she has never smoked. She does not have any smokeless tobacco history on file. Her alcohol and drug histories are not on file.   History reviewed. No pertinent family history.   Allergies  Allergen Reactions  . Codeine     slurred speech      Prior to Admission medications   Medication Sig Start Date End Date Taking? Authorizing Provider  acetaminophen (TYLENOL) 500 MG tablet Take 500 mg by mouth every 6 (six) hours  as needed for mild pain or moderate pain.    Yes Historical Provider, MD  atorvastatin (LIPITOR) 10 MG tablet Take 10 mg by mouth every morning.    Yes Historical Provider, MD  cholecalciferol (VITAMIN D) 1000 UNITS tablet Take 2,000 Units by mouth every morning.   Yes  Historical Provider, MD  conjugated estrogens (PREMARIN) vaginal cream Place 1 Applicatorful vaginally as needed (burning).   Yes Historical Provider, MD  Cyanocobalamin (B-12 COMPLIANCE INJECTION IJ) Inject as directed every 30 (thirty) days.   Yes Historical Provider, MD  glimepiride (AMARYL) 1 MG tablet Take 1 mg by mouth every morning.   Yes Historical Provider, MD  lisinopril (PRINIVIL,ZESTRIL) 10 MG tablet Take 10 mg by mouth every evening.    Yes Historical Provider, MD  lithium carbonate (LITHOBID) 300 MG CR tablet Take 150 mg by mouth every morning.   Yes Historical Provider, MD  metoprolol tartrate (LOPRESSOR) 25 MG tablet Take 12.5 mg by mouth 2 (two) times daily.   Yes Historical Provider, MD  mirtazapine (REMERON) 30 MG tablet Take 30 mg by mouth every evening.   Yes Historical Provider, MD  omeprazole (PRILOSEC) 40 MG capsule Take 40 mg by mouth every evening.    Yes Historical Provider, MD  oxybutynin (DITROPAN) 5 MG tablet Take 2.5 mg by mouth 2 (two) times daily.    Yes Historical Provider, MD  pregabalin (LYRICA) 50 MG capsule Take 50 mg by mouth 2 (two) times daily.    Yes Historical Provider, MD  risperiDONE (RISPERDAL) 1 MG tablet Take 0.5 mg by mouth every evening.    Yes Historical Provider, MD  traMADol (ULTRAM) 50 MG tablet Take 50 mg by mouth 2 (two) times daily as needed (pain).    Yes Historical Provider, MD  Vilazodone HCl 20 MG TABS Take 20 mg by mouth every morning.   Yes Historical Provider, MD  zolpidem (AMBIEN) 5 MG tablet Take 5 mg by mouth at bedtime.    Yes Historical Provider, MD    Review of Systems:  Constitutional:  No weight loss, night sweats, Fevers, chills Head&Eyes: No headache.  No vision loss.  No eye pain or scotoma ENT:  No Difficulty swallowing,Tooth/dental problems,Sore throat,   Cardio-vascular:  No chest pain, Orthopnea, PND, swelling in lower extremities,  dizziness, palpitations  GI:  No  abdominal pain, nausea, vomiting, diarrhea,  loss of appetite, hematochezia, melena, heartburn, indigestion, Resp:   No cough. No coughing up of blood .No wheezing.No chest wall deformity  Skin:  no rash or lesions.  GU:  no dysuria, change in color of urine, no urgency or frequency.  Musculoskeletal:  No joint pain or swelling. No decreased range of motion.  Psych:  No change in mood or affect.  Neurologic: No headache, no dysesthesia, no focal weakness, no vision loss.   Physical Exam: Filed Vitals:   07/18/14 1545 07/18/14 1600 07/18/14 1630 07/18/14 1707  BP:  186/52 183/53 242/69  Pulse: 66 66 65 70  Temp:    98.1 F (36.7 C)  TempSrc:    Oral  Resp: 21 21 20    Height:    5\' 4"  (1.626 m)  Weight:    78.608 kg (173 lb 4.8 oz)  SpO2: 97% 96% 96% 94%   General:  A&O x 2, NAD, nontoxic, pleasant/cooperative Head/Eye: No conjunctival hemorrhage, no icterus, [place]/AT, No nystagmus ENT:  No icterus,  No thrush, edentulous, no pharyngeal exudate Neck:  No masses, no lymphadenpathy, no bruits CV:  RRR, no  rub, no gallop, no S3 Lung:  Bibasilar crackles. No wheeze. Good air movement. Abdomen: soft/NT, +BS, nondistended, no peritoneal signs Ext: No cyanosis, No rashes, No petechiae, No lymphangitis, 1+LE edema   Labs on Admission:  Basic Metabolic Panel:  Recent Labs Lab 07/18/14 1216  NA 141  K 3.7  CL 106  CO2 27  GLUCOSE 171*  BUN 12  CREATININE 0.81  CALCIUM 9.4   Liver Function Tests: No results for input(s): AST, ALT, ALKPHOS, BILITOT, PROT, ALBUMIN in the last 168 hours. No results for input(s): LIPASE, AMYLASE in the last 168 hours. No results for input(s): AMMONIA in the last 168 hours. CBC:  Recent Labs Lab 07/18/14 1216  WBC 7.3  HGB 12.8  HCT 39.8  MCV 86.0  PLT 187   Cardiac Enzymes: No results for input(s): CKTOTAL, CKMB, CKMBINDEX, TROPONINI in the last 168 hours. BNP: Invalid input(s): POCBNP CBG: No results for input(s): GLUCAP in the last 168 hours.  Radiological Exams on  Admission: Dg Chest 2 View  07/18/2014   CLINICAL DATA:  Shortness of breath, chest pain.  EXAM: CHEST  2 VIEW  COMPARISON:  December 16, 2013.  FINDINGS: Stable cardiomediastinal silhouette. Stable prominent perihilar markings are noted most consistent with scarring. No acute pulmonary disease is noted. No pneumothorax or pleural effusion is noted. Bony thorax is intact.  IMPRESSION: No active cardiopulmonary disease.   Electronically Signed   By: Marijo Conception, M.D.   On: 07/18/2014 13:42    EKG: Independently reviewed. Sinus rhythm with artifact. Nonspecific ST changes.    Time spent:60 minutes Code Status:   FULL Family Communication:   Husband and daughter updated at bedside   Braylon Lemmons, DO  [facility] Pager [phone]  If 7PM-7AM, please contact night-coverage [email or web address] Password [facility] 07/18/2014, 5:26 PM

## 2014-07-18 NOTE — ED Provider Notes (Signed)
CSN: [id number]     Arrival date & time 07/18/14  1152 History   First MD Initiated Contact with Patient 07/18/14 1204     Chief Complaint  Patient presents with  . Shortness of Breath  . Chest Pain     (Consider location/radiation/quality/duration/timing/severity/associated sxs/prior Treatment) Patient is a 81 y.o. female presenting with chest pain.  Chest Pain Pain location:  Substernal area Pain quality: sharp   Pain radiates to:  Upper back Pain severity:  Severe Onset quality:  Sudden Duration:  6 hours Timing:  Constant Progression:  Unchanged Chronicity:  New Context comment:  Woke up from sleep. Relieved by:  Nothing Exacerbated by: lying down flat. Associated symptoms: nausea and shortness of breath   Associated symptoms: no cough, no dizziness and not vomiting     Past Medical History  Diagnosis Date  . Diabetes mellitus without complication   . Hypertension   . CKD (chronic kidney disease), stage III   . Coronary artery disease   . PVC (premature ventricular contraction)   . Hyperlipidemia   . Dyslipidemia   . GERD (gastroesophageal reflux disease)   . Osteopenia   . Chronic low back pain   . Postlaminectomy syndrome   . Right carpal tunnel syndrome   . Pernicious anemia   . Chronic interstitial cystitis   . Microhematuria   . Macular degeneration    History reviewed. No pertinent past surgical history. History reviewed. No pertinent family history. History  Substance Use Topics  . Smoking status: Never Smoker   . Smokeless tobacco: Not on file  . Alcohol Use: Not on file   OB History    No data available     Review of Systems  Respiratory: Positive for shortness of breath. Negative for cough.   Cardiovascular: Positive for chest pain.  Gastrointestinal: Positive for nausea. Negative for vomiting.  Neurological: Negative for dizziness.  All other systems reviewed and are negative.     Allergies  Codeine  Home Medications   Prior to  Admission medications   Medication Sig Start Date End Date Taking? Authorizing Provider  acetaminophen (TYLENOL) 500 MG tablet Take 500 mg by mouth every 6 (six) hours as needed.    Historical Provider, MD  amLODipine (NORVASC) 10 MG tablet Take 10 mg by mouth daily.      Historical Provider, MD  atorvastatin (LIPITOR) 10 MG tablet Take 10 mg by mouth daily.    Historical Provider, MD  cyanocobalamin 1000 MCG tablet Take 100 mcg by mouth daily.    Historical Provider, MD  esomeprazole (NEXIUM) 40 MG capsule Take 40 mg by mouth every evening.      Historical Provider, MD  ezetimibe-simvastatin (VYTORIN) 10-80 MG per tablet Take by mouth at bedtime.      Historical Provider, MD  glimepiride (AMARYL) 4 MG tablet Take 4 mg by mouth daily.      Historical Provider, MD  lisinopril (PRINIVIL,ZESTRIL) 10 MG tablet Take 10 mg by mouth daily.    Historical Provider, MD  LITHIUM CARBONATE PO Take 150 mg by mouth every morning.      Historical Provider, MD  metFORMIN (GLUMETZA) 500 MG (MOD) 24 hr tablet Take 1,000 mg by mouth daily with breakfast.      Historical Provider, MD  metoprolol succinate (TOPROL-XL) 25 MG 24 hr tablet Take 25 mg by mouth daily.      Historical Provider, MD  mirtazapine (REMERON) 15 MG tablet Take 15 mg by mouth at bedtime.  Historical Provider, MD  Omega-3 Fatty Acids (FISH OIL) 1000 MG CAPS Take by mouth 2 (two) times daily.      Historical Provider, MD  omeprazole (PRILOSEC) 40 MG capsule Take 40 mg by mouth daily.    Historical Provider, MD  oxybutynin (DITROPAN) 5 MG tablet Take 5 mg by mouth 3 (three) times daily.    Historical Provider, MD  pioglitazone (ACTOS) 30 MG tablet Take 30 mg by mouth daily.      Historical Provider, MD  pregabalin (LYRICA) 50 MG capsule Take 50 mg by mouth 3 (three) times daily.    Historical Provider, MD  risperiDONE (RISPERDAL) 1 MG tablet Take 1 mg by mouth at bedtime.    Historical Provider, MD  sertraline (ZOLOFT) 50 MG tablet Take 50 mg by  mouth daily.    Historical Provider, MD  valsartan-hydrochlorothiazide (DIOVAN-HCT) 160-25 MG per tablet Take 1 tablet by mouth daily.      Historical Provider, MD  venlafaxine (EFFEXOR) 75 MG tablet Take 75 mg by mouth daily.      Historical Provider, MD  Vitamin D, Ergocalciferol, (DRISDOL) 50000 UNITS CAPS capsule Take 50,000 Units by mouth every 7 (seven) days.    Historical Provider, MD  zolpidem (AMBIEN) 5 MG tablet Take 5 mg by mouth at bedtime as needed.      Historical Provider, MD   BP 204/57 mmHg  Pulse 65  Temp(Src) 98.4 F (36.9 C) (Oral)  Resp 23  SpO2 96% Physical Exam  Constitutional: She is oriented to person, place, and time. She appears well-developed and well-nourished. No distress.  HENT:  Head: Normocephalic and atraumatic.  Mouth/Throat: Oropharynx is clear and moist.  Eyes: Conjunctivae are normal. Pupils are equal, round, and reactive to light. No scleral icterus.  Neck: Neck supple.  Cardiovascular: Normal rate, regular rhythm, normal heart sounds and intact distal pulses.   No murmur heard. Pulmonary/Chest: Effort normal. No stridor. No respiratory distress. She has rales (bibasilar). She exhibits no tenderness.  Abdominal: Soft. Bowel sounds are normal. She exhibits no distension. There is no tenderness.  Musculoskeletal: Normal range of motion. She exhibits edema (1+ BLe).  Neurological: She is alert and oriented to person, place, and time.  Skin: Skin is warm and dry. No rash noted.  Psychiatric: She has a normal mood and affect. Her behavior is normal.  Nursing note and vitals reviewed.   ED Course  Procedures (including critical care time) Labs Review Labs Reviewed  BASIC METABOLIC PANEL - Abnormal; Notable for the following:    Glucose, Bld 171 (*)    All other components within normal limits  BRAIN NATRIURETIC PEPTIDE - Abnormal; Notable for the following:    B Natriuretic Peptide 250.6 (*)    All other components within normal limits  CBC   I-STAT TROPOININ, ED    Imaging Review Dg Chest 2 View  07/18/2014   CLINICAL DATA:  Shortness of breath, chest pain.  EXAM: CHEST  2 VIEW  COMPARISON:  December 16, 2013.  FINDINGS: Stable cardiomediastinal silhouette. Stable prominent perihilar markings are noted most consistent with scarring. No acute pulmonary disease is noted. No pneumothorax or pleural effusion is noted. Bony thorax is intact.  IMPRESSION: No active cardiopulmonary disease.   Electronically Signed   By: Marijo Conception, M.D.   On: 07/18/2014 13:42  All radiology studies independently viewed by me.      EKG Interpretation   Date/Time:  Sunday Jul 18 2014 11:58:49 EDT Ventricular Rate:  66 PR  Interval:  168 QRS Duration: 88 QT Interval:  406 QTC Calculation: 425 R Axis:   -9 Text Interpretation:  Normal sinus rhythm Minimal voltage criteria for  LVH, may be normal variant Borderline ECG nonspecific ST/T abnormality  improved compared to prior Confirmed by [facility]  MD, TREY ([id number]) on  07/18/2014 12:25:24 PM      MDM   Final diagnoses:  Chest pain, unspecified chest pain type    81 yo female presenting with chest pain. History and exam are concerning for possible CHF.  Plan admit for further chest pain workup.    Serita Grit, MD 07/18/14 1600

## 2014-07-18 NOTE — Progress Notes (Signed)
Pts bp elevated, checked with manual cuff. 200/84. Dr Tat made aware. Order received. Hydralizine 10 mg iv given

## 2014-07-19 ENCOUNTER — Inpatient Hospital Stay (HOSPITAL_COMMUNITY): Payer: Medicare Other

## 2014-07-19 ENCOUNTER — Encounter (HOSPITAL_COMMUNITY): Payer: Self-pay | Admitting: Physician Assistant

## 2014-07-19 DIAGNOSIS — R079 Chest pain, unspecified: Secondary | ICD-10-CM

## 2014-07-19 DIAGNOSIS — M79609 Pain in unspecified limb: Secondary | ICD-10-CM

## 2014-07-19 DIAGNOSIS — I509 Heart failure, unspecified: Secondary | ICD-10-CM

## 2014-07-19 LAB — BASIC METABOLIC PANEL
ANION GAP: 8 (ref 5–15)
BUN: 14 mg/dL (ref 6–20)
CO2: 29 mmol/L (ref 22–32)
CREATININE: 1.05 mg/dL — AB (ref 0.44–1.00)
Calcium: 9.2 mg/dL (ref 8.9–10.3)
Chloride: 103 mmol/L (ref 101–111)
GFR calc Af Amer: 55 mL/min — ABNORMAL LOW (ref >60)
GFR calc non Af Amer: 48 mL/min — ABNORMAL LOW (ref >60)
Glucose, Bld: 150 mg/dL — ABNORMAL HIGH (ref 65–99)
POTASSIUM: 3.6 mmol/L (ref 3.5–5.1)
Sodium: 140 mmol/L (ref 135–145)

## 2014-07-19 LAB — HEMOGLOBIN A1C
Hgb A1c MFr Bld: 7.3 % — ABNORMAL HIGH (ref 4.8–5.6)
Mean Plasma Glucose: 163 mg/dL

## 2014-07-19 LAB — GLUCOSE, CAPILLARY
GLUCOSE-CAPILLARY: 197 mg/dL — AB (ref 65–99)
GLUCOSE-CAPILLARY: 210 mg/dL — AB (ref 65–99)
Glucose-Capillary: 190 mg/dL — ABNORMAL HIGH (ref 65–99)
Glucose-Capillary: 250 mg/dL — ABNORMAL HIGH (ref 65–99)
Glucose-Capillary: 227 mg/dL — ABNORMAL HIGH (ref 65–99)

## 2014-07-19 LAB — TROPONIN I
Troponin I: <0.03 ng/mL (ref <0.031)
Troponin I: <0.03 ng/mL (ref <0.031)

## 2014-07-19 NOTE — Care Management Note (Signed)
Case Management Note  Patient Details  Name: [name] MRN: [id number] Date of Birth: 11-22-1929  Subjective/Objective:     Pt admitted on 07/18/14 with CHF exacerbation.  PTA, pt resides at home with spouse.                 Action/Plan: Will follow for discharge needs as pt progresses.  PT eval pending.    Expected Discharge Date:                  Expected Discharge Plan:  [place]  In-House Referral:     Discharge planning Services  CM Consult  Post Acute Care Choice:    Choice offered to:     DME Arranged:    DME Agency:     HH Arranged:    [place] Agency:     Status of Service:  In process, will continue to follow  Medicare Important Message Given:    Date Medicare IM Given:    Medicare IM give by:    Date Additional Medicare IM Given:    Additional Medicare Important Message give by:     If discussed at [place] of Stay Meetings, dates discussed:    Additional Comments:  Ella Bodo, RN 07/19/2014, 3:49 PM Phone [phone]

## 2014-07-19 NOTE — Progress Notes (Signed)
  Echocardiogram 2D Echocardiogram has been performed.  [name] 07/19/2014, 3:02 PM

## 2014-07-19 NOTE — Progress Notes (Signed)
Pt's family requesting to speak with Dr Wendee Beavers.  MD notified and called and spoke with pt's daughter over phone.

## 2014-07-19 NOTE — Progress Notes (Signed)
[facility] PROGRESS NOTE  [name] TMA:[id number] DOB: 1929-09-27 DOA: 07/18/2014 PCP: Irven Shelling, MD  Assessment/Plan: Active Problems:   Acute CHF - Will continue current diuretic regimen - Cardiology on board - agree with echocardiogram    Precordial chest pain - Consulted cardiology to see whether or not pateint required further work up from their staindpoint. - agree with echo    Uncontrolled hypertension - Continue beta blocker, lisinopril, improved blood pressures on this regimen.    Type 2 diabetes mellitus with hyperglycemia -We'll place on diabetic diet and continue sliding scale insulin    Hyperlipidemia - Patient is currently on statin, stable  Code Status: Full Family Communication: Discussed directly with patient Disposition Plan: Pending further workup and recommendations from specialist   Consultants:  Cardiologist  Procedures:  Echocardiogram pending  Antibiotics:  None  HPI/Subjective: Patient has no new complaints today. Daughter and patient reports the patient had some chest tightness with activity  Objective: Filed Vitals:   07/19/14 1317  BP: 151/42  Pulse: 79  Temp: 98.5 F (36.9 C)  Resp: 18    Intake/Output Summary (Last 24 hours) at 07/19/14 1819 Last data filed at 07/19/14 1714  Gross per 24 hour  Intake    838 ml  Output   2100 ml  Net  -1262 ml   Filed Weights   07/18/14 1707 07/19/14 0617  Weight: 173 lb 4.8 oz (78.608 kg) 169 lb 12.8 oz (77.021 kg)    Exam:   General:  Patient in no acute distress, alert and awake  Cardiovascular: S1 and S2 within normal limits, no rubs  Respiratory: No increased work of breathing, nasal cannula not well placed and patient in no acute distress. No audible wheezes  Abdomen: Soft, nondistended, nontender  Musculoskeletal: No cyanosis or clubbing on limited exam   Data Reviewed: Basic Metabolic Panel:  Recent Labs Lab 07/18/14 1216 07/19/14 0456  NA 141  140  K 3.7 3.6  CL 106 103  CO2 27 29  GLUCOSE 171* 150*  BUN 12 14  CREATININE 0.81 1.05*  CALCIUM 9.4 9.2   Liver Function Tests: No results for input(s): AST, ALT, ALKPHOS, BILITOT, PROT, ALBUMIN in the last 168 hours. No results for input(s): LIPASE, AMYLASE in the last 168 hours. No results for input(s): AMMONIA in the last 168 hours. CBC:  Recent Labs Lab 07/18/14 1216  WBC 7.3  HGB 12.8  HCT 39.8  MCV 86.0  PLT 187   Cardiac Enzymes:  Recent Labs Lab 07/18/14 1830 07/18/14 2351 07/19/14 0456  TROPONINI <0.03 <0.03 <0.03   BNP (last 3 results)  Recent Labs  07/18/14 1216  BNP 250.6*    ProBNP (last 3 results) No results for input(s): PROBNP in the last 8760 hours.  CBG:  Recent Labs Lab 07/18/14 1721 07/18/14 2103 07/19/14 0553 07/19/14 1104 07/19/14 1613  GLUCAP 102* 197* 190* 250* 197*    No results found for this or any previous visit (from the past 240 hour(s)).   Studies: Dg Chest 2 View  07/18/2014   CLINICAL DATA:  Shortness of breath, chest pain.  EXAM: CHEST  2 VIEW  COMPARISON:  December 16, 2013.  FINDINGS: Stable cardiomediastinal silhouette. Stable prominent perihilar markings are noted most consistent with scarring. No acute pulmonary disease is noted. No pneumothorax or pleural effusion is noted. Bony thorax is intact.  IMPRESSION: No active cardiopulmonary disease.   Electronically Signed   By: Marijo Conception, M.D.   On: 07/18/2014 13:42  Scheduled Meds: . atorvastatin  10 mg Oral BH-q7a  . carvedilol  6.25 mg Oral BID WC  . cholecalciferol  2,000 Units Oral BH-q7a  . enoxaparin (LOVENOX) injection  40 mg Subcutaneous Q24H  . furosemide  40 mg Intravenous BID  . insulin aspart  0-5 Units Subcutaneous QHS  . insulin aspart  0-9 Units Subcutaneous TID WC  . lisinopril  10 mg Oral QPM  . lithium carbonate  150 mg Oral BID WC  . mirtazapine  30 mg Oral QPM  . pantoprazole  80 mg Oral Daily  . pregabalin  50 mg Oral BID  .  risperiDONE  0.5 mg Oral QPM  . sodium chloride  3 mL Intravenous Q12H  . Vilazodone HCl  20 mg Oral Daily  . zolpidem  5 mg Oral QHS   Continuous Infusions:   Time spent: > 35 minutes   Velvet Bathe  [facility] Pager [phone]. If 7PM-7AM, please contact night-coverage at [email or web address], password [facility] 07/19/2014, 6:19 PM  LOS: 1 day

## 2014-07-19 NOTE — Consult Note (Signed)
CARDIOLOGY CONSULT NOTE   Patient ID: RACHEAL MATHURIN MRN: [id number] DOB/AGE: 79-Nov-1931 79 y.o.  Admit date: 07/18/2014  Primary Physician   Irven Shelling, MD Primary Cardiologist   None Reason for Consultation   Chest pain and SOB.   HPI: KIA VARNADORE is a 81 y.o. female with a history of DM, HTN, HLD, GERD, and depression/fibromyalgia/bipolar who presented to [facility] on 07/18/14 for worsening SOB and CP.   She has never been clearly been followed by a cardiologist. She thinks she saw a woman cardiologist remotely ~ 15 years ago but doesn't know why. She lives at home with her husband in [place] and is very sedentary. She walks with a walker but does not even do light housework due to deconditioning and weakness. She was in her usual state of health until about 1 month ago when she noticed worsening SOB and some chest tightness. Both of which were worse when lying down as well as with exertion. She does admit to LE edema, orthopnea and PND. Her chest tightness is mild and radiates to her back and is associated with worsening SOB and some nausea. She denies palpitations or syncope. She had some recent spells of "her breath being taken away" that woke her from sleep prior to admission. She presented the [facility] ED with above complaints. In the ED, the patient had oxygen saturation 94-96% on room air. She was afebrile and hemodynamically stable. She was quite hypertensive at the time of presentation with a blood pressure of 206/58. It did improve to 176/67 with giving Avapro. BMP was unremarkable as was the CBC. BNP was 250. CXR with no active pulmonary disease.  She was given IV lasix with improvement in her SOB. She currently is feeling much better in terms of SOB. She does have some residual chest tightness.    Past Medical History  Diagnosis Date  . Diabetes mellitus without complication   . Hypertension   . CKD (chronic kidney disease), stage III   . PVC (premature ventricular  contraction)   . Hyperlipidemia   . GERD (gastroesophageal reflux disease)   . Osteopenia   . Chronic low back pain   . Postlaminectomy syndrome   . Right carpal tunnel syndrome   . Pernicious anemia   . Chronic interstitial cystitis   . Microhematuria   . Macular degeneration      History reviewed. No pertinent past surgical history.  Allergies  Allergen Reactions  . Codeine     slurred speech    I have reviewed the patient's current medications . atorvastatin  10 mg Oral BH-q7a  . carvedilol  6.25 mg Oral BID WC  . cholecalciferol  2,000 Units Oral BH-q7a  . enoxaparin (LOVENOX) injection  40 mg Subcutaneous Q24H  . furosemide  40 mg Intravenous BID  . insulin aspart  0-5 Units Subcutaneous QHS  . insulin aspart  0-9 Units Subcutaneous TID WC  . lisinopril  10 mg Oral QPM  . lithium carbonate  150 mg Oral BID WC  . mirtazapine  30 mg Oral QPM  . pantoprazole  80 mg Oral Daily  . pregabalin  50 mg Oral BID  . risperiDONE  0.5 mg Oral QPM  . sodium chloride  3 mL Intravenous Q12H  . Vilazodone HCl  20 mg Oral Daily  . zolpidem  5 mg Oral QHS     sodium chloride, acetaminophen, hydrALAZINE, hydroxypropyl methylcellulose / hypromellose, nitroGLYCERIN, ondansetron (ZOFRAN) IV, sodium chloride, traMADol  Prior  to Admission medications   Medication Sig Start Date End Date Taking? Authorizing Provider  acetaminophen (TYLENOL) 500 MG tablet Take 500 mg by mouth every 6 (six) hours as needed for mild pain or moderate pain.    Yes Historical Provider, MD  atorvastatin (LIPITOR) 10 MG tablet Take 10 mg by mouth every morning.    Yes Historical Provider, MD  cholecalciferol (VITAMIN D) 1000 UNITS tablet Take 2,000 Units by mouth every morning.   Yes Historical Provider, MD  conjugated estrogens (PREMARIN) vaginal cream Place 1 Applicatorful vaginally as needed (burning).   Yes Historical Provider, MD  Cyanocobalamin (B-12 COMPLIANCE INJECTION IJ) Inject as directed every 30  (thirty) days.   Yes Historical Provider, MD  glimepiride (AMARYL) 1 MG tablet Take 1 mg by mouth every morning.   Yes Historical Provider, MD  lisinopril (PRINIVIL,ZESTRIL) 10 MG tablet Take 10 mg by mouth every evening.    Yes Historical Provider, MD  lithium carbonate (LITHOBID) 300 MG CR tablet Take 150 mg by mouth every morning.   Yes Historical Provider, MD  metoprolol tartrate (LOPRESSOR) 25 MG tablet Take 12.5 mg by mouth 2 (two) times daily.   Yes Historical Provider, MD  mirtazapine (REMERON) 30 MG tablet Take 30 mg by mouth every evening.   Yes Historical Provider, MD  omeprazole (PRILOSEC) 40 MG capsule Take 40 mg by mouth every evening.    Yes Historical Provider, MD  oxybutynin (DITROPAN) 5 MG tablet Take 2.5 mg by mouth 2 (two) times daily.    Yes Historical Provider, MD  pregabalin (LYRICA) 50 MG capsule Take 50 mg by mouth 2 (two) times daily.    Yes Historical Provider, MD  risperiDONE (RISPERDAL) 1 MG tablet Take 0.5 mg by mouth every evening.    Yes Historical Provider, MD  traMADol (ULTRAM) 50 MG tablet Take 50 mg by mouth 2 (two) times daily as needed (pain).    Yes Historical Provider, MD  Vilazodone HCl 20 MG TABS Take 20 mg by mouth every morning.   Yes Historical Provider, MD  zolpidem (AMBIEN) 5 MG tablet Take 5 mg by mouth at bedtime.    Yes Historical Provider, MD     History   Social History  . Marital Status: Married    Spouse Name: N/A  . Number of Children: N/A  . Years of Education: N/A   Occupational History  . Not on file.   Social History Main Topics  . Smoking status: Never Smoker   . Smokeless tobacco: Not on file  . Alcohol Use: Not on file  . Drug Use: Not on file  . Sexual Activity: Not on file   Other Topics Concern  . Not on file   Social History Narrative    No family status information on file.   History reviewed. No pertinent family history.   ROS:  Full 14 point review of systems complete and found to be negative unless listed  above.  Physical Exam: Blood pressure 151/42, pulse 79, temperature 98.5 F (36.9 C), temperature source Oral, resp. rate 18, height 5\' 4"  (1.626 m), weight 169 lb 12.8 oz (77.021 kg), SpO2 100 %.  General: Well developed, well nourished, female in no acute distress Head: Eyes PERRLA, No xanthomas.   Normocephalic and atraumatic, oropharynx without edema or exudate.   Lungs: CTAB, decreased breath sounds at bases  Heart: HRRR S1 S2, no rub/gallop, Heart irregular rate and rhythm with S1, S2  murmur. pulses are 2+ extrem.   Neck: No  carotid bruits. No lymphadenopathy.  No JVD. Abdomen: Bowel sounds present, abdomen soft and non-tender without masses or hernias noted. Msk:  No spine or cva tenderness. No weakness, no joint deformities or effusions. Extremities: No clubbing or cyanosis. 1+ bilateral edema.  Neuro: Alert and oriented X 3. No focal deficits noted. Psych:  Good affect, responds appropriately Skin: No rashes or lesions noted.  Labs:   Lab Results  Component Value Date   WBC 7.3 07/18/2014   HGB 12.8 07/18/2014   HCT 39.8 07/18/2014   MCV 86.0 07/18/2014   PLT 187 07/18/2014   No results for input(s): INR in the last 72 hours.   Recent Labs Lab 07/19/14 0456  NA 140  K 3.6  CL 103  CO2 29  BUN 14  CREATININE 1.05*  CALCIUM 9.2  GLUCOSE 150*   MAGNESIUM  Date Value Ref Range Status  07/24/2009 1.5 1.5 - 2.5 mg/dL Final    Recent Labs  07/18/14 1830 07/18/14 2351 07/19/14 0456  TROPONINI <0.03 <0.03 <0.03    Recent Labs  07/18/14 1229  TROPIPOC 0.00    No results found for: VITAMINB12, FOLATE, FERRITIN, TIBC, IRON, RETICCTPCT  Echo: pending  ECG:  Baseline artifact but NSR   Radiology:  Dg Chest 2 View  07/18/2014   CLINICAL DATA:  Shortness of breath, chest pain.  EXAM: CHEST  2 VIEW  COMPARISON:  December 16, 2013.  FINDINGS: Stable cardiomediastinal silhouette. Stable prominent perihilar markings are noted most consistent with scarring. No  acute pulmonary disease is noted. No pneumothorax or pleural effusion is noted. Bony thorax is intact.  IMPRESSION: No active cardiopulmonary disease.   Electronically Signed   By: Marijo Conception, M.D.   On: 07/18/2014 13:42    ASSESSMENT AND PLAN:    Active Problems:   Acute CHF   Precordial chest pain   Uncontrolled hypertension   Type 2 diabetes mellitus with hyperglycemia   Hyperlipidemia  CAMBRE MATSON is a 81 y.o. female with a history of DM, HTN, HLD, GERD, and depression/fibromyalgia/bipolar who presented to [facility] on 07/18/14 for worsening SOB and CP.   Acute CHF- BNP slightly elevated and patient with s/s of slight volume overload. Suspect diastolic dysfunction exacerbated by HTN urgency. Need better control of her BP -- Patient give 2 doses of IV lasix 40mg . Net neg 1.2L Weight down 4 lbs. Continue 40mg  IV BID for now.  -- Continue daily weights and strict I's and O's  -- Agree with 2D Echocardiogram to assess LV function and wall motion.  Atypical chest discomfort  -- Troponin Neg x3 -- EKG with nonspecific ST changes but with a lot of baseline artifact , but will await 2D ECHO.   Uncontrolled hypertension  -- BP much better control but still mildly elevated. Continue lisinopril 10mg  and Coreg 6.25mg  BID.  meds were just advance  Follow before adjusting furhter   -- Hydralazine PRN   Hyperlipidemia  -- Continue Lipitor   Diabetes mellitus type 2  -- Management per IM. HgA1c  Lower extremity pain and edema  -- Venous duplex neg for DVT    Depression/fibromyalgia/Bipolar -- Continue Lyrica, lithium, vilazodone and remeron per IM.     SignedCrista Luria 07/19/2014 2:43 PM  Pager [phone]  Patinet seen and examined  I agree with findngs as noted by Kathlene November above   Patinet is an 81 yo with DM, HTN   Presents with SOB and edema  Found to be hypertensive and  in CHF   Echo today showed LVEF is normal  60 to 65% with mild diastolic dysfunction.     Patient's breathing is still not at baseline   Would continue diuresis. Would sched Lexiscan myoview to rule out ischemia as cause of her chest tightness  Symptoms are not classic but she is not very active  [employer]

## 2014-07-19 NOTE — Progress Notes (Signed)
Bilateral lower extremity venous duplex completed:  No evidence of DVT, superficial thrombosis, or Baker's cyst.   

## 2014-07-20 ENCOUNTER — Encounter (HOSPITAL_COMMUNITY): Payer: Self-pay | Admitting: Nurse Practitioner

## 2014-07-20 ENCOUNTER — Inpatient Hospital Stay (HOSPITAL_COMMUNITY): Payer: Medicare Other

## 2014-07-20 DIAGNOSIS — N183 Chronic kidney disease, stage 3 unspecified: Secondary | ICD-10-CM | POA: Diagnosis present

## 2014-07-20 DIAGNOSIS — I5032 Chronic diastolic (congestive) heart failure: Secondary | ICD-10-CM

## 2014-07-20 DIAGNOSIS — R079 Chest pain, unspecified: Secondary | ICD-10-CM

## 2014-07-20 LAB — GLUCOSE, CAPILLARY
GLUCOSE-CAPILLARY: 184 mg/dL — AB (ref 65–99)
Glucose-Capillary: 182 mg/dL — ABNORMAL HIGH (ref 65–99)
Glucose-Capillary: 186 mg/dL — ABNORMAL HIGH (ref 65–99)

## 2014-07-20 LAB — BASIC METABOLIC PANEL
ANION GAP: 11 (ref 5–15)
BUN: 24 mg/dL — ABNORMAL HIGH (ref 6–20)
CO2: 31 mmol/L (ref 22–32)
CREATININE: 1.51 mg/dL — AB (ref 0.44–1.00)
Calcium: 9.6 mg/dL (ref 8.9–10.3)
Chloride: 100 mmol/L — ABNORMAL LOW (ref 101–111)
GFR, EST AFRICAN AMERICAN: 36 mL/min — AB (ref >60)
GFR, EST NON AFRICAN AMERICAN: 31 mL/min — AB (ref >60)
Glucose, Bld: 171 mg/dL — ABNORMAL HIGH (ref 65–99)
Potassium: 3.4 mmol/L — ABNORMAL LOW (ref 3.5–5.1)
Sodium: 142 mmol/L (ref 135–145)

## 2014-07-20 MED ORDER — ENOXAPARIN SODIUM 30 MG/0.3ML ~~LOC~~ SOLN
30.0000 mg | SUBCUTANEOUS | Status: DC
  Administered 2014-07-20 – 2014-07-21 (×2): 30 mg via SUBCUTANEOUS
  Filled 2014-07-20 (×3): qty 0.3

## 2014-07-20 MED ORDER — FUROSEMIDE 40 MG PO TABS
40.0000 mg | ORAL_TABLET | Freq: Every day | ORAL | Status: DC
  Administered 2014-07-20 – 2014-07-22 (×3): 40 mg via ORAL
  Filled 2014-07-20 (×3): qty 1

## 2014-07-20 MED ORDER — LITHIUM CARBONATE 150 MG PO CAPS
150.0000 mg | ORAL_CAPSULE | Freq: Two times a day (BID) | ORAL | Status: DC
  Administered 2014-07-21 – 2014-07-22 (×3): 150 mg via ORAL
  Filled 2014-07-20 (×5): qty 1

## 2014-07-20 MED ORDER — TECHNETIUM TC 99M SESTAMIBI GENERIC - CARDIOLITE
30.0000 | Freq: Once | INTRAVENOUS | Status: AC | PRN
  Administered 2014-07-20: 30 via INTRAVENOUS

## 2014-07-20 MED ORDER — CARVEDILOL 6.25 MG PO TABS
9.3750 mg | ORAL_TABLET | Freq: Two times a day (BID) | ORAL | Status: DC
  Administered 2014-07-21: 9.375 mg via ORAL
  Filled 2014-07-20 (×3): qty 1

## 2014-07-20 MED ORDER — CARVEDILOL 3.125 MG PO TABS
3.1250 mg | ORAL_TABLET | Freq: Once | ORAL | Status: AC
  Administered 2014-07-20: 3.125 mg via ORAL
  Filled 2014-07-20: qty 1

## 2014-07-20 MED ORDER — AMLODIPINE BESYLATE 5 MG PO TABS: 5.0000 mg | ORAL_TABLET | Freq: Every day | ORAL | Status: DC

## 2014-07-20 MED ORDER — REGADENOSON 0.4 MG/5ML IV SOLN
0.4000 mg | Freq: Once | INTRAVENOUS | Status: AC
  Administered 2014-07-20: 0.4 mg via INTRAVENOUS
  Filled 2014-07-20: qty 5

## 2014-07-20 MED ORDER — CARVEDILOL 6.25 MG PO TABS
9.3750 mg | ORAL_TABLET | Freq: Two times a day (BID) | ORAL | Status: DC
  Administered 2014-07-20: 9.375 mg via ORAL
  Filled 2014-07-20 (×2): qty 1

## 2014-07-20 MED ORDER — REGADENOSON 0.4 MG/5ML IV SOLN
INTRAVENOUS | Status: AC
  Administered 2014-07-20: 0.4 mg via INTRAVENOUS
  Filled 2014-07-20: qty 5

## 2014-07-20 MED ORDER — POTASSIUM CHLORIDE CRYS ER 20 MEQ PO TBCR
40.0000 meq | EXTENDED_RELEASE_TABLET | Freq: Once | ORAL | Status: AC
  Administered 2014-07-20: 40 meq via ORAL
  Filled 2014-07-20: qty 2

## 2014-07-20 MED ORDER — TECHNETIUM TC 99M SESTAMIBI GENERIC - CARDIOLITE
10.0000 | Freq: Once | INTRAVENOUS | Status: AC | PRN
  Administered 2014-07-20: 10 via INTRAVENOUS

## 2014-07-20 NOTE — Progress Notes (Signed)
[facility] PROGRESS NOTE  [name] HER:[id number] DOB: Jun 30, 1929 DOA: 07/18/2014 PCP: Irven Shelling, MD  Brief narrative: Patient is an 81 year old Caucasian female presented complaining of chest discomfort and chest tightness with activity. Diagnosed with acute diastolic congestive heart failure after workup. Cardiology consulted and assisting with management and/or further evaluation recommendations from their standpoint  Assessment/Plan: Active Problems:   Acute CHF - On lasix 40 mg po daily - Cardiology on board and assisting - echocardiogram: reporting EF of 60-65% with grade 1 DD    Precordial chest pain - Consulted cardiology to see whether or not pateint required further work up from their staindpoint. - agree with echo - s/p stress test results: Pending    Uncontrolled hypertension - Continue beta blocker, lisinopril - agree with increasing dose of b blocker.    Type 2 diabetes mellitus with hyperglycemia -Continue on diabetic diet and continue sliding scale insulin    Hyperlipidemia - Patient is currently on statin, stable  Code Status: Full Family Communication: Discussed directly with patient Disposition Plan: Pending further workup and recommendations from specialist   Consultants:  Cardiologist  Procedures:  Echocardiogram pending  Antibiotics:  None  HPI/Subjective: Pt has no new complaints.   Objective: Filed Vitals:   07/20/14 1300  BP: 177/70  Pulse: 82  Temp: 98.9 F (37.2 C)  Resp:     Intake/Output Summary (Last 24 hours) at 07/20/14 1510 Last data filed at 07/20/14 1442  Gross per 24 hour  Intake    678 ml  Output   1400 ml  Net   -722 ml   Filed Weights   07/18/14 1707 07/19/14 0617 07/20/14 0550  Weight: 173 lb 4.8 oz (78.608 kg) 169 lb 12.8 oz (77.021 kg) 170 lb 4.8 oz (77.248 kg)    Exam:   General:  Patient in no acute distress, alert and awake  Cardiovascular: S1 and S2 within normal limits, no  rubs  Respiratory: No increased work of breathing, nasal cannula not well placed and patient in no acute distress. No audible wheezes, decreased breath sounds at bases  Abdomen: Soft, nondistended, nontender  Musculoskeletal: No cyanosis or clubbing on limited exam   Data Reviewed: Basic Metabolic Panel:  Recent Labs Lab 07/18/14 1216 07/19/14 0456 07/20/14 0249  NA 141 140 142  K 3.7 3.6 3.4*  CL 106 103 100*  CO2 27 29 31   GLUCOSE 171* 150* 171*  BUN 12 14 24*  CREATININE 0.81 1.05* 1.51*  CALCIUM 9.4 9.2 9.6   Liver Function Tests: No results for input(s): AST, ALT, ALKPHOS, BILITOT, PROT, ALBUMIN in the last 168 hours. No results for input(s): LIPASE, AMYLASE in the last 168 hours. No results for input(s): AMMONIA in the last 168 hours. CBC:  Recent Labs Lab 07/18/14 1216  WBC 7.3  HGB 12.8  HCT 39.8  MCV 86.0  PLT 187   Cardiac Enzymes:  Recent Labs Lab 07/18/14 1830 07/18/14 2351 07/19/14 0456  TROPONINI <0.03 <0.03 <0.03   BNP (last 3 results)  Recent Labs  07/18/14 1216  BNP 250.6*    ProBNP (last 3 results) No results for input(s): PROBNP in the last 8760 hours.  CBG:  Recent Labs Lab 07/19/14 1613 07/19/14 2121 07/19/14 2326 07/20/14 0618 07/20/14 1235  GLUCAP 197* 227* 210* 184* 182*    No results found for this or any previous visit (from the past 240 hour(s)).   Studies: No results found.  Scheduled Meds: . atorvastatin  10 mg Oral BH-q7a  .  carvedilol  9.375 mg Oral BID WC  . cholecalciferol  2,000 Units Oral BH-q7a  . enoxaparin (LOVENOX) injection  30 mg Subcutaneous Q24H  . furosemide  40 mg Oral Daily  . insulin aspart  0-5 Units Subcutaneous QHS  . insulin aspart  0-9 Units Subcutaneous TID WC  . lisinopril  10 mg Oral QPM  . lithium carbonate  150 mg Oral BID WC  . mirtazapine  30 mg Oral QPM  . pantoprazole  80 mg Oral Daily  . pregabalin  50 mg Oral BID  . risperiDONE  0.5 mg Oral QPM  . sodium chloride   3 mL Intravenous Q12H  . Vilazodone HCl  20 mg Oral Daily  . zolpidem  5 mg Oral QHS   Continuous Infusions:   Time spent: > 35 minutes   [name]  [facility] Pager [phone]. If 7PM-7AM, please contact night-coverage at [email or web address], password [email or web address] 07/20/2014, 3:10 PM  LOS: 2 days

## 2014-07-20 NOTE — Progress Notes (Signed)
PT Cancellation Note  Patient Details Name: [name] MRN: [id number] DOB: 1929/06/10   Cancelled Treatment:    Reason Eval/Treat Not Completed: Patient at procedure or test/unavailable. Will check back as schedule permits.   Yuridiana Formanek LUBECK 07/20/2014, 10:00 AM

## 2014-07-20 NOTE — Progress Notes (Signed)
BP 177/70. MD aware new orders given. Will continue to monitor patient for further changes in condition.

## 2014-07-20 NOTE — Progress Notes (Signed)
Patient Name: [name] Date of Encounter: 07/20/2014     Principal Problem:   Acute diastolic CHF (congestive heart failure) Active Problems:   Precordial chest pain   Uncontrolled hypertension   Type 2 diabetes mellitus with hyperglycemia   Hyperlipidemia   CKD (chronic kidney disease), stage III   SUBJECTIVE  Seen in nuc med.  She has continued to have chest tightness over the past 24 hrs - constant.  Dyspnea improved.  For cardiolite today.  CURRENT MEDS . atorvastatin  10 mg Oral BH-q7a  . carvedilol  6.25 mg Oral BID WC  . cholecalciferol  2,000 Units Oral BH-q7a  . enoxaparin (LOVENOX) injection  40 mg Subcutaneous Q24H  . furosemide  40 mg Intravenous BID  . insulin aspart  0-5 Units Subcutaneous QHS  . insulin aspart  0-9 Units Subcutaneous TID WC  . lisinopril  10 mg Oral QPM  . lithium carbonate  150 mg Oral BID WC  . mirtazapine  30 mg Oral QPM  . pantoprazole  80 mg Oral Daily  . pregabalin  50 mg Oral BID  . risperiDONE  0.5 mg Oral QPM  . sodium chloride  3 mL Intravenous Q12H  . Vilazodone HCl  20 mg Oral Daily  . zolpidem  5 mg Oral QHS    OBJECTIVE  Filed Vitals:   07/20/14 0550 07/20/14 0954 07/20/14 1028 07/20/14 1030  BP: 114/93 149/62 165/41 169/48  Pulse: 72 67 85 86  Temp: 98.2 F (36.8 C)     TempSrc: Oral     Resp: 17     Height:      Weight: 170 lb 4.8 oz (77.248 kg)     SpO2: 100%       Intake/Output Summary (Last 24 hours) at 07/20/14 1031 Last data filed at 07/20/14 0843  Gross per 24 hour  Intake    678 ml  Output   2125 ml  Net  -1447 ml   Filed Weights   07/18/14 1707 07/19/14 0617 07/20/14 0550  Weight: 173 lb 4.8 oz (78.608 kg) 169 lb 12.8 oz (77.021 kg) 170 lb 4.8 oz (77.248 kg)    PHYSICAL EXAM  General: Pleasant, NAD. Neuro: Alert and oriented X 3. Moves all extremities spontaneously. Psych: Normal affect. HEENT:  Normal  Neck: Supple without bruits or JVD. Lungs:  Resp regular and unlabored, diminished  breath sounds bilat. Heart: RRR no s3, s4, or murmurs. Abdomen: Soft, non-tender, non-distended, BS + x 4.  Extremities: No clubbing, cyanosis.  Trace, nonpitting LLE edema. DP/PT/Radials 2+ and equal bilaterally.  Accessory Clinical Findings  CBC  Recent Labs  07/18/14 1216  WBC 7.3  HGB 12.8  HCT 39.8  MCV 86.0  PLT 564   Basic Metabolic Panel  Recent Labs  07/19/14 0456 07/20/14 0249  NA 140 142  K 3.6 3.4*  CL 103 100*  CO2 29 31  GLUCOSE 150* 171*  BUN 14 24*  CREATININE 1.05* 1.51*  CALCIUM 9.2 9.6   Cardiac Enzymes  Recent Labs  07/18/14 1830 07/18/14 2351 07/19/14 0456  TROPONINI <0.03 <0.03 <0.03   Hemoglobin A1C  Recent Labs  07/18/14 1830  HGBA1C 7.3*   TELE  Seen nuc med -in sinus.  Radiology/Studies  Dg Chest 2 View  07/18/2014   CLINICAL DATA:  Shortness of breath, chest pain.  EXAM: CHEST  2 VIEW  COMPARISON:  December 16, 2013.  FINDINGS: Stable cardiomediastinal silhouette. Stable prominent perihilar markings are noted most consistent with  scarring. No acute pulmonary disease is noted. No pneumothorax or pleural effusion is noted. Bony thorax is intact.  IMPRESSION: No active cardiopulmonary disease.   Electronically Signed   By: Marijo Conception, M.D.   On: 07/18/2014 13:42   2D Echocardiogram 5.16.2016  Study Conclusions  - Left ventricle: The cavity size was normal. Systolic function was   normal. The estimated ejection fraction was in the range of 60%   to 65%. Although no diagnostic regional wall motion abnormality   was identified, this possibility cannot be completely excluded on   the basis of this study. Doppler parameters are consistent with   abnormal left ventricular relaxation (grade 1 diastolic   dysfunction). - Mitral valve: Calcified annulus. - Pericardium, extracardiac: A trivial pericardial effusion was   identified. _____________   ASSESSMENT AND PLAN  1.  Midsternal chest pain:  Pt presented with c/p that  has now been constant over the past 24 hrs.  ECG with baseline TWI in lat leads.  Despite prolonged Ss, Trop neg.  Echo nl EF. For cardiolite today.  2.  Acute diastolic CHF:  Volume looks good.  LLE edema but neg LE u/s yesterday.  BUN/Creat/bicarb bumping.  Switch to PO lasix. She is unsure of previous dry wt but is net neg 2L and down 3 lbs.  Breathing stable.  No orthopnea.  HR stable.  BP variable.  F/u after AM meds.  3.  HTN:  BP variable.  F/U after AM meds.  Cont bb/acei.  4.  HL:  Cont statin.  5.  DM II:  Per IM.  6.  CKD III:  Creat up in setting of diuresis.  Will switch to PO.  7.  Hypokalemia:  Supp.  Signed, Murray Hodgkins NP

## 2014-07-21 DIAGNOSIS — I5031 Acute diastolic (congestive) heart failure: Principal | ICD-10-CM

## 2014-07-21 DIAGNOSIS — E1165 Type 2 diabetes mellitus with hyperglycemia: Secondary | ICD-10-CM

## 2014-07-21 DIAGNOSIS — I10 Essential (primary) hypertension: Secondary | ICD-10-CM

## 2014-07-21 DIAGNOSIS — N183 Chronic kidney disease, stage 3 (moderate): Secondary | ICD-10-CM

## 2014-07-21 DIAGNOSIS — R079 Chest pain, unspecified: Secondary | ICD-10-CM | POA: Insufficient documentation

## 2014-07-21 DIAGNOSIS — R072 Precordial pain: Secondary | ICD-10-CM

## 2014-07-21 DIAGNOSIS — E785 Hyperlipidemia, unspecified: Secondary | ICD-10-CM

## 2014-07-21 LAB — BASIC METABOLIC PANEL
ANION GAP: 11 (ref 5–15)
BUN: 31 mg/dL — ABNORMAL HIGH (ref 6–20)
CALCIUM: 9.2 mg/dL (ref 8.9–10.3)
CO2: 28 mmol/L (ref 22–32)
Chloride: 103 mmol/L (ref 101–111)
Creatinine, Ser: 1.38 mg/dL — ABNORMAL HIGH (ref 0.44–1.00)
GFR, EST AFRICAN AMERICAN: 40 mL/min — AB (ref >60)
GFR, EST NON AFRICAN AMERICAN: 34 mL/min — AB (ref >60)
Glucose, Bld: 161 mg/dL — ABNORMAL HIGH (ref 65–99)
Potassium: 3.8 mmol/L (ref 3.5–5.1)
SODIUM: 142 mmol/L (ref 135–145)

## 2014-07-21 LAB — GLUCOSE, CAPILLARY
GLUCOSE-CAPILLARY: 249 mg/dL — AB (ref 65–99)
GLUCOSE-CAPILLARY: 181 mg/dL — AB (ref 65–99)
Glucose-Capillary: 164 mg/dL — ABNORMAL HIGH (ref 65–99)
Glucose-Capillary: 172 mg/dL — ABNORMAL HIGH (ref 65–99)

## 2014-07-21 MED ORDER — ALUM & MAG HYDROXIDE-SIMETH 200-200-20 MG/5ML PO SUSP
30.0000 mL | ORAL | Status: DC | PRN
  Administered 2014-07-21: 30 mL via ORAL
  Filled 2014-07-21: qty 30

## 2014-07-21 MED ORDER — LISINOPRIL 20 MG PO TABS
20.0000 mg | ORAL_TABLET | Freq: Every evening | ORAL | Status: DC
  Administered 2014-07-21: 20 mg via ORAL
  Filled 2014-07-21 (×2): qty 1

## 2014-07-21 MED ORDER — METOPROLOL TARTRATE 50 MG PO TABS
50.0000 mg | ORAL_TABLET | Freq: Two times a day (BID) | ORAL | Status: DC
  Administered 2014-07-21 – 2014-07-22 (×2): 50 mg via ORAL
  Filled 2014-07-21 (×3): qty 1

## 2014-07-21 NOTE — Progress Notes (Signed)
Called to room by patient with c/o epigastric pain/indigestion. PRN Maalox order placed per standing orders. Will administer as ordered and continue to monitor. Tresa Endo

## 2014-07-21 NOTE — Progress Notes (Signed)
Patient: [name] / Admit Date: 07/18/2014 / Date of Encounter: 07/21/2014, 9:45 AM   Subjective: Feeling better, but still notes persistent chest tightness with urge to cough and mild dyspnea when she moves around. Pulse Ox 97% on RA.   Objective: Telemetry: NSR Physical Exam: Blood pressure 174/85, pulse 75, temperature 98.3 F (36.8 C), temperature source Oral, resp. rate 18, height 5\' 4"  (1.626 m), weight 169 lb 1.6 oz (76.703 kg), SpO2 97 %. General: Well developed, well nourished WF in no acute distress Head: Normocephalic, atraumatic, sclera non-icteric, no xanthomas, nares are without discharge. Neck: JVP not elevated. Lungs: Diminished BS bilaterally without wheezes, rales, or rhonchi. Breathing is unlabored. Heart: RRR S1 S2 without murmurs, rubs, or gallops.  Abdomen: Soft, non-tender, non-distended with normoactive bowel sounds. No rebound/guarding. Extremities: No clubbing or cyanosis. No edema. Distal pedal pulses are 2+ and equal bilaterally. Neuro: Alert and oriented X 3. Moves all extremities spontaneously. Psych:  Responds to questions appropriately with a normal affect.   Intake/Output Summary (Last 24 hours) at 07/21/14 0945 Last data filed at 07/21/14 0858  Gross per 24 hour  Intake   1660 ml  Output    875 ml  Net    785 ml    Inpatient Medications:  . atorvastatin  10 mg Oral BH-q7a  . carvedilol  9.375 mg Oral BID WC  . cholecalciferol  2,000 Units Oral BH-q7a  . enoxaparin (LOVENOX) injection  30 mg Subcutaneous Q24H  . furosemide  40 mg Oral Daily  . insulin aspart  0-5 Units Subcutaneous QHS  . insulin aspart  0-9 Units Subcutaneous TID WC  . lisinopril  10 mg Oral QPM  . lithium carbonate  150 mg Oral BID WC  . mirtazapine  30 mg Oral QPM  . pantoprazole  80 mg Oral Daily  . pregabalin  50 mg Oral BID  . risperiDONE  0.5 mg Oral QPM  . sodium chloride  3 mL Intravenous Q12H  . Vilazodone HCl  20 mg Oral Daily  . zolpidem  5 mg Oral QHS    Infusions:    Labs:  Recent Labs  07/20/14 0249 07/21/14 0340  NA 142 142  K 3.4* 3.8  CL 100* 103  CO2 31 28  GLUCOSE 171* 161*  BUN 24* 31*  CREATININE 1.51* 1.38*  CALCIUM 9.6 9.2   No results for input(s): AST, ALT, ALKPHOS, BILITOT, PROT, ALBUMIN in the last 72 hours.  Recent Labs  07/18/14 1216  WBC 7.3  HGB 12.8  HCT 39.8  MCV 86.0  PLT 187    Recent Labs  07/18/14 1830 07/18/14 2351 07/19/14 0456  TROPONINI <0.03 <0.03 <0.03   Invalid input(s): POCBNP  Recent Labs  07/18/14 1830  HGBA1C 7.3*     Radiology/Studies:  Dg Chest 2 View  07/18/2014   CLINICAL DATA:  Shortness of breath, chest pain.  EXAM: CHEST  2 VIEW  COMPARISON:  December 16, 2013.  FINDINGS: Stable cardiomediastinal silhouette. Stable prominent perihilar markings are noted most consistent with scarring. No acute pulmonary disease is noted. No pneumothorax or pleural effusion is noted. Bony thorax is intact.  IMPRESSION: No active cardiopulmonary disease.   Electronically Signed   By: Marijo Conception, M.D.   On: 07/18/2014 13:42   Nm Myocar Multi W/spect W/wall Motion / Ef  07/20/2014   CLINICAL DATA:  Ms Devine is a 81 yo who was admitted with chest pain. A Lexiscan Myoview was ordered for further evaluation  EXAM: MYOCARDIAL IMAGING WITH SPECT (REST AND PHARMACOLOGIC-STRESS)  GATED LEFT VENTRICULAR WALL MOTION STUDY  LEFT VENTRICULAR EJECTION FRACTION  TECHNIQUE: Standard myocardial SPECT imaging was performed after resting intravenous injection of 10 mCi Tc-76m sestamibi. Subsequently, intravenous infusion of Lexiscan was performed under the supervision of the Cardiology staff. At peak effect of the drug, 30 mCi Tc-74m sestamibi was injected intravenously and standard myocardial SPECT imaging was performed. Quantitative gated imaging was also performed to evaluate left ventricular wall motion, and estimate left ventricular ejection fraction.  COMPARISON:  None.  FINDINGS: She had nonspecific  ST changes at baseline. There were no ST or T wave changes following Lexiscan infusion.  The raw data images show no significant motion artifact. There is infiltration of the Myoview in the left arm IV site.  The stress Myoview images show fairly smooth and normal uptake in all areas of the LV.  The rest images show a similar pattern of smooth and normal uptake in all areas of the LV.  There is no evidence of ischemia.  The QGS shows:  Left Ventricular Ejection Fraction: 52 %. I think the actual LV function is greater than the 52% calculated by the computer.  End diastolic volume 71 ml  End systolic volume 34 ml  IMPRESSION: 1.  Normal Lexiscan Myoview  2.  Normal LV function  3. Left ventricular ejection fraction 52%  Mertie Moores, MD, [facility]   Electronically Signed   By: Mertie Moores   On: 07/20/2014 15:22     Assessment and Plan   1. Midsternal chest pain/dyspnea: Pt presented with c/p that had been constant over the past 24 hrs. ECG with baseline TWI in lat leads.Despite prolonged symptoms, troponin neg.Echo with normal EF. Nuclear stress test was normal. With diuresis, she feels better but still notes some continued chest tightness and DOE. Not tachycardic, tachypneic or hypoxic. Will discuss further workup with MD as I am not convinced this is all related to residual HF. ? Related to residually elevated blood pressure. Decreased BS on exam -> may consider changing BB to more selective form.  2. Acute diastolic CHF: Volume looks good. Neg LE [place]. BUN/Creat/bicarb bumped with diuresis -> switched to PO lasix. She is unsure of previous dry wt but is down 4lb.No orthopnea.  3. HTN: labile BP. Recheck this AM. See above re: BB.  4. HL: Cont statin.  5. DM II: Per IM.  6. CKD III: Cr trended up with diuresis -> now on the way back down.   7. Hypokalemia:Improved. Now that she is on lower dose of Lasix may not need regular supplementation given renal insufficiency but we can follow as  outpatient.  Signed, Melina Copa PA-C Pager: [phone]   I seen and evaluated the patient this afternoon along with Melina Copa, PA-C. I reviewed the chart and all available data and discussed the patient with both Ms. Idolina Primer and the attending physician on the [facility]. She has had a relatively normal echocardiogram with likely diastolic dysfunction along with a negative/nonischemic nuclear stress test. Despite this she still has her chest which is most likely noncardiac in nature. She has some dyspnea and poorly controlled hypertension. The dyspnea is worse with exertion.  Overall, not much actively going on for cardiac standpoint. She was diuresed to the point of increase in her creatinine so I don't think this is an acute diastolic heart failure exacerbation. Her dyspnea basically be related to diastolic dysfunction. She needs better blood pressure control and I  do agree with potentially switching to a beta 1 selective beta blocker. Would also increase lisinopril to 20 mg for additional. We will switch her from carvedilol to metoprolol 50 mg twice a day.  If blood pressure stable likely discharge the morning.    Leonie Man, M.D., M.S. Interventional Cardiologist   Pager # [phone]

## 2014-07-21 NOTE — Progress Notes (Signed)
[facility]                                                                              Patient Demographics  [name], is a 81 y.o. female, DOB - April 10, 1929, GGE:[id number]  Admit date - 07/18/2014   Admitting Physician Orson Eva, MD  Outpatient Primary MD for the patient is Irven Shelling, MD  LOS - 3   Chief Complaint  Patient presents with  . Shortness of Breath  . Chest Pain        Assessment & Plan   Acute CHF, diastolic -On lasix 40 mg po daily -Cardiology consulted and appreciated -echocardiogram: reporting EF of 60-65% with grade 1 DD  Precordial chest pain -Consulted cardiology to see whether or not pateint required further work up from their staindpoint. -Lexiscan: normal, EF 52% -LE doppler: negative for DVT  Uncontrolled hypertension -Spoke with cardiology, increase lisinopril20mg  BID  -Discontinue Coreg -Will place on metoprolol 50mg  BID  Type 2 diabetes mellitus with hyperglycemia -Continue on diabetic diet and continue sliding scale insulin  Hyperlipidemia -continue statin  Code Status: Full  Family Communication: None at bedside, daughter via phone  Disposition Plan: Admitted, would like BP to improve before discharge   Time Spent in minutes   30 minutes  Procedures: Echocardiogram Lower extremity doppler  Consultations: Cardiology  DVT Prophylaxis  lovenox  Lab Results  Component Value Date   PLT 187 07/18/2014    Medications  Scheduled Meds: . atorvastatin  10 mg Oral BH-q7a  . carvedilol  9.375 mg Oral BID WC  . cholecalciferol  2,000 Units Oral BH-q7a  . enoxaparin (LOVENOX) injection  30 mg Subcutaneous Q24H  . furosemide  40 mg Oral Daily  . insulin aspart  0-5 Units Subcutaneous QHS  . insulin aspart  0-9 Units Subcutaneous TID WC  . lisinopril  10 mg Oral QPM  . lithium carbonate  150 mg Oral BID WC  . mirtazapine  30 mg Oral QPM  . pantoprazole  80 mg Oral Daily  . pregabalin  50 mg Oral BID  .  risperiDONE  0.5 mg Oral QPM  . sodium chloride  3 mL Intravenous Q12H  . Vilazodone HCl  20 mg Oral Daily  . zolpidem  5 mg Oral QHS   Continuous Infusions:  PRN Meds:.sodium chloride, acetaminophen, hydrALAZINE, hydroxypropyl methylcellulose / hypromellose, nitroGLYCERIN, ondansetron (ZOFRAN) IV, sodium chloride, traMADol  Antibiotics    Anti-infectives    None        Subjective:   [name] seen and examined today.  Patient states she is ready to go home.  She denies any chest pain.  Has shortness of breath with walking.  Denies abdominal pain, dizziness, headache.   Objective:   Filed Vitals:   07/20/14 1736 07/20/14 2042 07/21/14 0638 07/21/14 1017  BP: 139/41 129/42 174/85   Pulse: 70 70 75 101  Temp:  98.4 F (36.9 C) 98.3 F (36.8 C)   TempSrc:  Oral Oral   Resp:  18 18   Height:      Weight:   76.703 kg (169 lb 1.6 oz)   SpO2:  98% 97% 91%    Wt Readings from  Last 3 Encounters:  07/21/14 76.703 kg (169 lb 1.6 oz)     Intake/Output Summary (Last 24 hours) at 07/21/14 1357 Last data filed at 07/21/14 1116  Gross per 24 hour  Intake   1303 ml  Output    875 ml  Net    428 ml    Exam  General: Well developed, well nourished, NAD, appears stated age  68: NCAT,mucous membranes moist.  Cardiovascular: S1 S2 auscultated, no rubs, murmurs or gallops. Regular rate and rhythm.  Respiratory: Clear to auscultation bilaterally with equal chest rise  Abdomen: Soft, nontender, nondistended, + bowel sounds  Extremities: warm dry without cyanosis clubbing or edema  Neuro: AAOx3, nonfocal  Psych: Normal affect and demeanor  Data Review   Micro Results No results found for this or any previous visit (from the past 240 hour(s)).  Radiology Reports Dg Chest 2 View  07/18/2014   CLINICAL DATA:  Shortness of breath, chest pain.  EXAM: CHEST  2 VIEW  COMPARISON:  December 16, 2013.  FINDINGS: Stable cardiomediastinal silhouette. Stable prominent perihilar  markings are noted most consistent with scarring. No acute pulmonary disease is noted. No pneumothorax or pleural effusion is noted. Bony thorax is intact.  IMPRESSION: No active cardiopulmonary disease.   Electronically Signed   By: Marijo Conception, M.D.   On: 07/18/2014 13:42   Nm Myocar Multi W/spect W/wall Motion / Ef  07/20/2014   CLINICAL DATA:  Ms Zahn is a 81 yo who was admitted with chest pain. A Lexiscan Myoview was ordered for further evaluation  EXAM: MYOCARDIAL IMAGING WITH SPECT (REST AND PHARMACOLOGIC-STRESS)  GATED LEFT VENTRICULAR WALL MOTION STUDY  LEFT VENTRICULAR EJECTION FRACTION  TECHNIQUE: Standard myocardial SPECT imaging was performed after resting intravenous injection of 10 mCi Tc-70m sestamibi. Subsequently, intravenous infusion of Lexiscan was performed under the supervision of the Cardiology staff. At peak effect of the drug, 30 mCi Tc-90m sestamibi was injected intravenously and standard myocardial SPECT imaging was performed. Quantitative gated imaging was also performed to evaluate left ventricular wall motion, and estimate left ventricular ejection fraction.  COMPARISON:  None.  FINDINGS: She had nonspecific ST changes at baseline. There were no ST or T wave changes following Lexiscan infusion.  The raw data images show no significant motion artifact. There is infiltration of the Myoview in the left arm IV site.  The stress Myoview images show fairly smooth and normal uptake in all areas of the LV.  The rest images show a similar pattern of smooth and normal uptake in all areas of the LV.  There is no evidence of ischemia.  The QGS shows:  Left Ventricular Ejection Fraction: 52 %. I think the actual LV function is greater than the 52% calculated by the computer.  End diastolic volume 71 ml  End systolic volume 34 ml  IMPRESSION: 1.  Normal Lexiscan Myoview  2.  Normal LV function  3. Left ventricular ejection fraction 52%  Mertie Moores, MD, [facility]   Electronically Signed   By:  Mertie Moores   On: 07/20/2014 15:22    CBC  Recent Labs Lab 07/18/14 1216  WBC 7.3  HGB 12.8  HCT 39.8  PLT 187  MCV 86.0  MCH 27.6  MCHC 32.2  RDW 12.7    Chemistries   Recent Labs Lab 07/18/14 1216 07/19/14 0456 07/20/14 0249 07/21/14 0340  NA 141 140 142 142  K 3.7 3.6 3.4* 3.8  CL 106 103 100* 103  CO2 27 29  31 28  GLUCOSE 171* 150* 171* 161*  BUN 12 14 24* 31*  CREATININE 0.81 1.05* 1.51* 1.38*  CALCIUM 9.4 9.2 9.6 9.2   ------------------------------------------------------------------------------------------------------------------ estimated creatinine clearance is 31 mL/min (by C-G formula based on Cr of 1.38). ------------------------------------------------------------------------------------------------------------------  Recent Labs  07/18/14 1830  HGBA1C 7.3*   ------------------------------------------------------------------------------------------------------------------ No results for input(s): CHOL, HDL, LDLCALC, TRIG, CHOLHDL, LDLDIRECT in the last 72 hours. ------------------------------------------------------------------------------------------------------------------ No results for input(s): TSH, T4TOTAL, T3FREE, THYROIDAB in the last 72 hours.  Invalid input(s): FREET3 ------------------------------------------------------------------------------------------------------------------ No results for input(s): VITAMINB12, FOLATE, FERRITIN, TIBC, IRON, RETICCTPCT in the last 72 hours.  Coagulation profile No results for input(s): INR, PROTIME in the last 168 hours.  No results for input(s): DDIMER in the last 72 hours.  Cardiac Enzymes  Recent Labs Lab 07/18/14 1830 07/18/14 2351 07/19/14 0456  TROPONINI <0.03 <0.03 <0.03   ------------------------------------------------------------------------------------------------------------------ Invalid input(s): POCBNP    Davon Abdelaziz D.O. on 07/21/2014 at 1:57 PM  Between 7am  to 7pm - Pager - [phone]  After 7pm go to [email or web address] - password [email or web address]  And look for the night coverage person covering for me after hours  [facility] Office  [phone]

## 2014-07-21 NOTE — Care Management Note (Signed)
Case Management Note  Patient Details  Name: [name] MRN: [id number] Date of Birth: 07-Jun-1929  Subjective/Objective:       Pt would benefit from [facility] follow up for CHF management at dc, as well as HHPT as recommended by physical therapist.           Action/Plan: Met with pt to discuss home needs.  She refuses HH follow up, as she states she has had in the past, and it did not help her.  After much discussion, she will not reconsider.    Expected Discharge Date:                  Expected Discharge Plan:  [place]  In-House Referral:     Discharge planning Services  CM Consult  Post Acute Care Choice:    Choice offered to:     DME Arranged:    DME Agency:     HH Arranged:  Patient Refused [place] Agency:     Status of Service:  Completed, signed off  Medicare Important Message Given:  Yes Date Medicare IM Given:  07/21/14 Medicare IM give by:  Ellan Lambert, RN, BSN  Date Additional Medicare IM Given:    Additional Medicare Important Message give by:     If discussed at [place] of Stay Meetings, dates discussed:    Additional Comments:  Ella Bodo, RN 07/21/2014, 2:43 PM Phone [phone]

## 2014-07-21 NOTE — Progress Notes (Signed)
Patient alert oriented,, no c/o pain or shortness of breath. SR on the monitor. Patient c/o burning when use bathroom. MD notified. Will check UA and CX.. Will continue to monitor patient.

## 2014-07-21 NOTE — Discharge Summary (Signed)
Physician Discharge Summary  [name] WVP:[id number] DOB: 1929-09-14 DOA: 07/18/2014  PCP: Jane Shelling, MD  Admit date: 07/18/2014 Discharge date: 07/22/2014  Time spent: 45 minutes  Recommendations for Outpatient Follow-up:  Patient will be discharged to home with home health, physical therapy and nursing.  Patient will need to follow up with primary care provider within one week of discharge.  Patient should also follow up with cardiology within 1 month of discharge. Patient should continue medications as prescribed.  Patient should follow a hearth healthy/carb modidied diet with 1557ml fluid restriction per day.   Discharge Diagnoses:  Principal Problem:   Acute diastolic CHF (congestive heart failure) Active Problems:   Precordial chest pain   Uncontrolled hypertension   Type 2 diabetes mellitus with hyperglycemia   Hyperlipidemia   CKD (chronic kidney disease), stage III   Pain in the chest   Discharge Condition: Stable  Diet recommendation: hearth healthy/carb modidied diet with 1564ml fluid restriction per day  Filed Weights   07/20/14 0550 07/21/14 0638 07/22/14 0524  Weight: 77.248 kg (170 lb 4.8 oz) 76.703 kg (169 lb 1.6 oz) 77.2 kg (170 lb 3.1 oz)    History of present illness:  On 07/18/2014 by [name] 81 year old female with a history of diabetes mellitus, coronary artery disease, hyperlipidemia, presented with 1 month history of intermittent chest discomfort and shortness of breath. She states that her chest discomfort is usually substernal in nature and occurs at rest without any other exacerbating or alleviating factors. She has had some dyspnea on exertion, but her complaint of shortness of breath occurs when she wakes up at night with dyspnea. She denies any fevers, chills, coughing, hemoptysis, nausea, vomiting, diarrhea, abdominal pain. At baseline, the patient uses a walker to ambulate, and she has noted some increasing dyspnea with exertion  getting around. The patient has also noted lower extremity edema that has been present for the better part of one month, but it has not significantly worsened since that time. Unfortunately, the patient is a rather poor historian, and this history is mostly obtained from the patient's husband and daughter at the bedside. Because of the patient's chest tightness and shortness of breath had become more frequent, she presented to the emergency department.  In the emergency department, patient had oxygen saturation 94-96% on room air, but she was afebrile and hemodynamically stable. She was quite hypertensive at the time of presentation with a blood pressure of 206/58. It did improve to 176/67 with giving Avapro. BMP was unremarkable as was the CBC. BNP was 250. EKG showed sinus rhythm with lots of artifact and nonspecific ST changes. Chest x-ray showed prominent stable vascular markings  Hospital Course:  Acute CHF, diastolic -On lasix 40 mg po daily -Cardiology consulted and appreciated -echocardiogram: reporting EF of 60-65% with grade 1 DD  Precordial chest pain -Consulted cardiology to see whether or not pateint required further work up from their [place]: normal, EF 52% -LE doppler: negative for DVT  Uncontrolled hypertension -Spoke with cardiology, increased lisinopril 20mg  BID  -Discontinued Coreg, and metoprolol 50mg  BID started -BP much more controlled today -Patient will need to continue follow up with cardiology and PCP for BP control and management  Type 2 diabetes mellitus with hyperglycemia -Continue on diabetic diet and continue sliding scale insulin -Hemoglobin A1c 7.3 -Continue home medications upon discharge  Hyperlipidemia -continue statin  CKD, stage 3 -Cr stable, currently 1.38  Chronic lower back pain -continue tramadol  Dysuria -UA negative for infection -  Restart home medications, Ditropan, Premarin  Procedures: Echocardiogram Lower  extremity doppler  Consultations: Cardiology  Discharge Exam: Filed Vitals:   07/22/14 1000  BP: 130/46  Pulse: 78  Temp: 98.2 F (36.8 C)  Resp: 18     General: Well developed, well nourished, no distress  HEENT: NCAT,mucous membranes moist.  Cardiovascular: S1 S2 auscultated, no murmurs, RRR   Respiratory: Clear to auscultation  Abdomen: Soft, nontender, nondistended, + bowel sounds  Extremities: warm dry without cyanosis clubbing or edema  Neuro: AAOx3, nonfocal  Psych: Normal affect and demeanor   Discharge Instructions      Discharge Instructions    Discharge instructions    Complete by:  As directed   Patient will be discharged to home with home health, physical therapy and nursing.  Patient will need to follow up with primary care provider within one week of discharge.  Patient should also follow up with cardiology within 1 month of discharge. Patient should continue medications as prescribed.  Patient should follow a hearth healthy/carb modidied diet with 1541ml fluid restriction per day.            Medication List    TAKE these medications        acetaminophen 500 MG tablet  Commonly known as:  TYLENOL  Take 500 mg by mouth every 6 (six) hours as needed for mild pain or moderate pain.     alum & mag hydroxide-simeth 200-200-20 MG/5ML suspension  Commonly known as:  MAALOX/MYLANTA  Take 30 mLs by mouth every 4 (four) hours as needed for indigestion or heartburn.     atorvastatin 10 MG tablet  Commonly known as:  LIPITOR  Take 10 mg by mouth every morning.     B-12 COMPLIANCE INJECTION IJ  Inject as directed every 30 (thirty) days.     cholecalciferol 1000 UNITS tablet  Commonly known as:  VITAMIN D  Take 2,000 Units by mouth every morning.     conjugated estrogens vaginal cream  Commonly known as:  PREMARIN  Place 1 Applicatorful vaginally as needed (burning).     furosemide 40 MG tablet  Commonly known as:  LASIX  Take 1 tablet (40 mg  total) by mouth daily.     glimepiride 1 MG tablet  Commonly known as:  AMARYL  Take 1 mg by mouth every morning.     lisinopril 20 MG tablet  Commonly known as:  PRINIVIL,ZESTRIL  Take 1 tablet (20 mg total) by mouth every evening.     lithium carbonate 300 MG CR tablet  Commonly known as:  LITHOBID  Take 150 mg by mouth every morning.     metoprolol 50 MG tablet  Commonly known as:  LOPRESSOR  Take 1 tablet (50 mg total) by mouth 2 (two) times daily.     mirtazapine 30 MG tablet  Commonly known as:  REMERON  Take 30 mg by mouth every evening.     nitroGLYCERIN 0.4 MG SL tablet  Commonly known as:  NITROSTAT  Place 1 tablet (0.4 mg total) under the tongue every 5 (five) minutes as needed for chest pain.     omeprazole 40 MG capsule  Commonly known as:  PRILOSEC  Take 40 mg by mouth every evening.     oxybutynin 5 MG tablet  Commonly known as:  DITROPAN  Take 2.5 mg by mouth 2 (two) times daily.     pregabalin 50 MG capsule  Commonly known as:  LYRICA  Take 50 mg by mouth  2 (two) times daily.     risperiDONE 1 MG tablet  Commonly known as:  RISPERDAL  Take 0.5 mg by mouth every evening.     traMADol 50 MG tablet  Commonly known as:  ULTRAM  Take 1 tablet (50 mg total) by mouth 2 (two) times daily as needed (pain).     Vilazodone HCl 20 MG Tabs  Take 20 mg by mouth every morning.     zolpidem 5 MG tablet  Commonly known as:  AMBIEN  Take 5 mg by mouth at bedtime.       Allergies  Allergen Reactions  . Codeine     slurred speech   Follow-up Information    Follow up with Jane Shelling, MD. Schedule an appointment as soon as possible for a visit in 1 week.   Specialty:  Internal Medicine   Why:  Hospital follow up   Contact information:   [address] [place] [address] [phone]       Follow up with [facility], Leonie Green, MD. Schedule an appointment as soon as possible for a visit in 2 weeks.   Specialty:  Cardiology   Why:   Hospital follow up   Contact information:   [address] [place] [address] [phone]        The results of significant diagnostics from this hospitalization (including imaging, microbiology, ancillary and laboratory) are listed below for reference.    Significant Diagnostic Studies: Dg Chest 2 View  07/18/2014   CLINICAL DATA:  Shortness of breath, chest pain.  EXAM: CHEST  2 VIEW  COMPARISON:  December 16, 2013.  FINDINGS: Stable cardiomediastinal silhouette. Stable prominent perihilar markings are noted most consistent with scarring. No acute pulmonary disease is noted. No pneumothorax or pleural effusion is noted. Bony thorax is intact.  IMPRESSION: No active cardiopulmonary disease.   Electronically Signed   By: Marijo Conception, M.D.   On: 07/18/2014 13:42   Nm Myocar Multi W/spect W/wall Motion / Ef  07/20/2014   CLINICAL DATA:  Ms Cannady is a 81 yo who was admitted with chest pain. A Lexiscan Myoview was ordered for further evaluation  EXAM: MYOCARDIAL IMAGING WITH SPECT (REST AND PHARMACOLOGIC-STRESS)  GATED LEFT VENTRICULAR WALL MOTION STUDY  LEFT VENTRICULAR EJECTION FRACTION  TECHNIQUE: Standard myocardial SPECT imaging was performed after resting intravenous injection of 10 mCi Tc-3m sestamibi. Subsequently, intravenous infusion of Lexiscan was performed under the supervision of the Cardiology staff. At peak effect of the drug, 30 mCi Tc-40m sestamibi was injected intravenously and standard myocardial SPECT imaging was performed. Quantitative gated imaging was also performed to evaluate left ventricular wall motion, and estimate left ventricular ejection fraction.  COMPARISON:  None.  FINDINGS: She had nonspecific ST changes at baseline. There were no ST or T wave changes following Lexiscan infusion.  The raw data images show no significant motion artifact. There is infiltration of the Myoview in the left arm IV site.  The stress Myoview images show fairly smooth and  normal uptake in all areas of the LV.  The rest images show a similar pattern of smooth and normal uptake in all areas of the LV.  There is no evidence of ischemia.  The QGS shows:  Left Ventricular Ejection Fraction: 52 %. I think the actual LV function is greater than the 52% calculated by the computer.  End diastolic volume 71 ml  End systolic volume 34 ml  IMPRESSION: 1.  Normal Lexiscan Myoview  2.  Normal LV function  3. Left ventricular ejection fraction 52%  Mertie Moores, MD, [facility]   Electronically Signed   By: Mertie Moores   On: 07/20/2014 15:22    Microbiology: No results found for this or any previous visit (from the past 240 hour(s)).   Labs: Basic Metabolic Panel:  Recent Labs Lab 07/18/14 1216 07/19/14 0456 07/20/14 0249 07/21/14 0340  NA 141 140 142 142  K 3.7 3.6 3.4* 3.8  CL 106 103 100* 103  CO2 27 29 31 28   GLUCOSE 171* 150* 171* 161*  BUN 12 14 24* 31*  CREATININE 0.81 1.05* 1.51* 1.38*  CALCIUM 9.4 9.2 9.6 9.2   Liver Function Tests: No results for input(s): AST, ALT, ALKPHOS, BILITOT, PROT, ALBUMIN in the last 168 hours. No results for input(s): LIPASE, AMYLASE in the last 168 hours. No results for input(s): AMMONIA in the last 168 hours. CBC:  Recent Labs Lab 07/18/14 1216  WBC 7.3  HGB 12.8  HCT 39.8  MCV 86.0  PLT 187   Cardiac Enzymes:  Recent Labs Lab 07/18/14 1830 07/18/14 2351 07/19/14 0456  TROPONINI <0.03 <0.03 <0.03   BNP: BNP (last 3 results)  Recent Labs  07/18/14 1216  BNP 250.6*    ProBNP (last 3 results) No results for input(s): PROBNP in the last 8760 hours.  CBG:  Recent Labs Lab 07/21/14 0638 07/21/14 1139 07/21/14 1631 07/21/14 2100 07/22/14 0632  GLUCAP 164* 172* 249* 181* 184*       Signed:  Cristal Ford  [facility] 07/22/2014, 10:29 AM

## 2014-07-21 NOTE — Evaluation (Signed)
Physical Therapy Evaluation Patient Details Name: [name] MRN: [id number] DOB: 1929-03-26 Today's Date: 07/21/2014   History of Present Illness  81 y.o. female with h/o DM, CAD, and 2 back surgeries admitted with chest discomfort, SOB, LE edema. Dx of CHF.  Clinical Impression  Pt admitted with above diagnosis. Pt currently with functional limitations due to the deficits listed below (see PT Problem List). Pt ambulated 160' with RW and min/guard assist. SaO2 91-94% on RA walking, HR 101 walking. She would benefit from OT consult to address limited AROM in B shoulders, MD-please order if you agree.  Pt will benefit from skilled PT to increase their independence and safety with mobility to allow discharge to the venue listed below.       Follow Up Recommendations Home health PT    Equipment Recommendations  None recommended by PT    Recommendations for Other Services       Precautions / Restrictions Precautions Precautions: Fall Precaution Comments: pt reports most recent fall was 6 months ago Restrictions Weight Bearing Restrictions: No      Mobility  Bed Mobility               General bed mobility comments: NT -up in chair  Transfers Overall transfer level: Needs assistance Equipment used: Rolling walker (2 wheeled) Transfers: Sit to/from Stand Sit to Stand: Supervision         General transfer comment: cues for hand placement  Ambulation/Gait Ambulation/Gait assistance: Min guard Ambulation Distance (Feet): 160 Feet Assistive device: Rolling walker (2 wheeled) Gait Pattern/deviations: Decreased step length - left;Decreased step length - right;Trunk flexed     General Gait Details: cues to step into frame of RW and to lift head due to forward flexed neck; LBP limited distance; SaO2 91-94% on RA walking, HR 101 walking, HR 73 at rest  Stairs            Wheelchair Mobility    Modified Rankin (Stroke Patients Only)       Balance Overall  balance assessment: Modified Independent                                           Pertinent Vitals/Pain Pain Assessment: 0-10 Pain Score: 8  Pain Location: low back, chronic Pain Descriptors / Indicators: Sore Pain Intervention(s): Monitored during session;Limited activity within patient's tolerance    Home Living Family/patient expects to be discharged to:: Private residence Living Arrangements: Spouse/significant other Available Help at Discharge: Family;Available 24 hours/day   Home Access: Level entry     Home Layout: One level Home Equipment: Walker - 2 wheels;Shower seat;Bedside commode;Wheelchair - manual      Prior Function Level of Independence: Needs assistance   Gait / Transfers Assistance Needed: independent with RW  ADL's / Homemaking Assistance Needed: aide 2x/week for bathing, husband assists with dressing; not able to do housework        Hand Dominance        Extremity/Trunk Assessment   Upper Extremity Assessment: Generalized weakness (limited shoulder elevation AROM bilaterally, L approx 45*, R 80*,  OT consult recommended)           Lower Extremity Assessment: Overall WFL for tasks assessed      Cervical / Trunk Assessment: Kyphotic (forward head with sitting and moreso in standing)  Communication   Communication: No difficulties  Cognition Arousal/Alertness: Awake/alert Behavior During Therapy:  WFL for tasks assessed/performed Overall Cognitive Status: Within Functional Limits for tasks assessed                      General Comments      Exercises Other Exercises Other Exercises: neck extension AROM x 10 seated      Assessment/Plan    PT Assessment Patient needs continued PT services  PT Diagnosis Generalized weakness   PT Problem List Decreased range of motion;Decreased activity tolerance;Pain;Decreased knowledge of use of DME  PT Treatment Interventions Gait training;Functional mobility  training;Therapeutic activities;Patient/family education;Therapeutic exercise   PT Goals (Current goals can be found in the Care Plan section) Acute Rehab PT Goals Patient Stated Goal: return to riding stationary bike, improve UE ROM PT Goal Formulation: With patient Time For Goal Achievement: 08/04/14    Frequency Min 3X/week   Barriers to discharge        Co-evaluation               End of Session Equipment Utilized During Treatment: Gait belt Activity Tolerance: Patient tolerated treatment well Patient left: in chair;with call bell/phone within reach Nurse Communication: Mobility status         Time: 6503-5465 PT Time Calculation (min) (ACUTE ONLY): 27 min   Charges:   PT Evaluation $Initial PT Evaluation Tier I: 1 Procedure PT Treatments $Gait Training: 8-22 mins   PT G Codes:        Philomena Doheny 07/21/2014, 10:25 AM  [phone]

## 2014-07-22 DIAGNOSIS — R079 Chest pain, unspecified: Secondary | ICD-10-CM

## 2014-07-22 LAB — URINALYSIS, ROUTINE W REFLEX MICROSCOPIC
Bilirubin Urine: NEGATIVE
Glucose, UA: NEGATIVE mg/dL
HGB URINE DIPSTICK: NEGATIVE
KETONES UR: NEGATIVE mg/dL
Leukocytes, UA: NEGATIVE
NITRITE: NEGATIVE
PH: 5 (ref 5.0–8.0)
Protein, ur: NEGATIVE mg/dL
SPECIFIC GRAVITY, URINE: 1.015 (ref 1.005–1.030)
UROBILINOGEN UA: 0.2 mg/dL (ref 0.0–1.0)

## 2014-07-22 LAB — GLUCOSE, CAPILLARY
GLUCOSE-CAPILLARY: 184 mg/dL — AB (ref 65–99)
Glucose-Capillary: 176 mg/dL — ABNORMAL HIGH (ref 65–99)

## 2014-07-22 MED ORDER — NITROGLYCERIN 0.4 MG SL SUBL: 0.4000 mg | SUBLINGUAL_TABLET | SUBLINGUAL | Status: AC | PRN

## 2014-07-22 MED ORDER — METOPROLOL TARTRATE 50 MG PO TABS: 50.0000 mg | ORAL_TABLET | Freq: Two times a day (BID) | ORAL | Status: AC

## 2014-07-22 MED ORDER — ALUM & MAG HYDROXIDE-SIMETH 200-200-20 MG/5ML PO SUSP: 30.0000 mL | ORAL | Status: DC | PRN

## 2014-07-22 MED ORDER — FUROSEMIDE 40 MG PO TABS: 40.0000 mg | ORAL_TABLET | Freq: Every day | ORAL | Status: DC

## 2014-07-22 MED ORDER — LISINOPRIL 20 MG PO TABS: 20.0000 mg | ORAL_TABLET | Freq: Every evening | ORAL | Status: AC

## 2014-07-22 MED ORDER — TRAMADOL HCL 50 MG PO TABS: 50.0000 mg | ORAL_TABLET | Freq: Two times a day (BID) | ORAL | Status: AC | PRN

## 2014-07-22 NOTE — Discharge Instructions (Signed)

## 2014-07-22 NOTE — Progress Notes (Signed)
Talked to patient about DCP/ [place] services; patient refused all [place] services, stated that she had a bad experience with home care in the past and does not want any HHC to go to her home again. Patient stated that she has someone that comes to her home to assist with cleaning, bathing and dressing. Mindi Slicker RN,BSN,MHA [phone]

## 2014-07-23 LAB — URINE CULTURE: Colony Count: 50000

## 2014-08-04 ENCOUNTER — Ambulatory Visit (INDEPENDENT_AMBULATORY_CARE_PROVIDER_SITE_OTHER): Payer: Medicare Other | Admitting: Physician Assistant

## 2014-08-04 ENCOUNTER — Encounter: Payer: Self-pay | Admitting: Physician Assistant

## 2014-08-04 VITALS — BP 108/58 | HR 62 | Ht 64.0 in

## 2014-08-04 DIAGNOSIS — N183 Chronic kidney disease, stage 3 unspecified: Secondary | ICD-10-CM

## 2014-08-04 DIAGNOSIS — I10 Essential (primary) hypertension: Secondary | ICD-10-CM | POA: Diagnosis not present

## 2014-08-04 DIAGNOSIS — I5032 Chronic diastolic (congestive) heart failure: Secondary | ICD-10-CM | POA: Diagnosis not present

## 2014-08-04 DIAGNOSIS — R079 Chest pain, unspecified: Secondary | ICD-10-CM

## 2014-08-04 DIAGNOSIS — I5031 Acute diastolic (congestive) heart failure: Secondary | ICD-10-CM | POA: Diagnosis not present

## 2014-08-04 LAB — BASIC METABOLIC PANEL
BUN: 52 mg/dL — ABNORMAL HIGH (ref 6–23)
CALCIUM: 9.6 mg/dL (ref 8.4–10.5)
CO2: 31 meq/L (ref 19–32)
CREATININE: 1.8 mg/dL — AB (ref 0.40–1.20)
Chloride: 102 mEq/L (ref 96–112)
GFR: 28.56 mL/min — AB (ref >60.00)
GLUCOSE: 199 mg/dL — AB (ref 70–99)
POTASSIUM: 4.4 meq/L (ref 3.5–5.1)
SODIUM: 139 meq/L (ref 135–145)

## 2014-08-04 NOTE — Assessment & Plan Note (Signed)
Checking basic metabolic panel today.

## 2014-08-04 NOTE — Assessment & Plan Note (Signed)
Status post a normal nuclear stress test. Sounds musculoskeletal/pleuritic.  It is intermittent and exacerbated by a deep breath

## 2014-08-04 NOTE — Assessment & Plan Note (Signed)
Blood pressure is well-controlled. Continue lisinopril, metoprolol, Lasix

## 2014-08-04 NOTE — Assessment & Plan Note (Signed)
Patient appears euvolemic.  He is on Lasix 40 mg daily. And ACE inhibitor. I'm going to check a basic metabolic panel to evaluate kidney function and potassium.

## 2014-08-04 NOTE — Progress Notes (Signed)
Patient ID: [name], female   DOB: 09/01/1929, 81 y.o.   MRN: [id number]    Date:  08/04/2014   ID:  [name], DOB [birth year], MRN [id number]  PCP:  Irven Shelling, MD  Primary Cardiologist:  Ross([place])   Chief complaint: Post hospital follow-up    History of Present Illness: [name] is a 81 y.o. female with a history of DM, HTN, HLD, GERD, chronic kidney disease stage III and depression/fibromyalgia/bipolar who presented to [facility] on 07/18/14 for worsening SOB and CP.   She is very unsteady on her feet and is essentially confined to a wheelchair.  She had a 2-D echocardiogram which revealed an ejection fraction of 60-65%. His grade 1 diastolic dysfunction.  Because of persistent chest discomfort during that admission she underwent stress testing which was normal.  He did undergo diuresis during the hospitalization and was switched to a more cardiac selective beta blocker and lisinopril was increased to 20 mg.  She presents today for posthospital evaluation.  Some mild chest discomfort occasionally when she takes a deep breath.  Her sleep with her bed elevated. Lower extremity edema that she had prior hospitalization and has not returned.  She does wear ankle high compression socks.  Bowel movements have been normal. She essentially feels as good as she did when she was discharged.  The patient currently denies nausea, vomiting, fever, shortness of breath, dizziness, PND, cough, congestion, abdominal pain, hematochezia, melena.  Wt Readings from Last 3 Encounters:  07/22/14 170 lb 3.1 oz (77.2 kg)  The patient cannot stand on a scale   Past Medical History  Diagnosis Date  . Diabetes mellitus without complication   . Hypertension   . CKD (chronic kidney disease), stage III   . PVC (premature ventricular contraction)   . Hyperlipidemia   . GERD (gastroesophageal reflux disease)   . Osteopenia   . Chronic low back pain   . Postlaminectomy syndrome   . Right carpal tunnel syndrome    . Pernicious anemia   . Chronic interstitial cystitis   . Microhematuria   . Macular degeneration   . Chest pain     a. 07/2014 Neg MV, nl EF.    Current Outpatient Prescriptions  Medication Sig Dispense Refill  . acetaminophen (TYLENOL) 500 MG tablet Take 500 mg by mouth every 6 (six) hours as needed for mild pain or moderate pain.     Marland Kitchen alum & mag hydroxide-simeth (MAALOX/MYLANTA) 200-200-20 MG/5ML suspension Take 30 mLs by mouth every 4 (four) hours as needed for indigestion or heartburn. 355 mL 0  . atorvastatin (LIPITOR) 10 MG tablet Take 10 mg by mouth every morning.     . cholecalciferol (VITAMIN D) 1000 UNITS tablet Take 2,000 Units by mouth every morning.    . conjugated estrogens (PREMARIN) vaginal cream Place 1 Applicatorful vaginally as needed (burning).    . Cyanocobalamin (B-12 COMPLIANCE INJECTION IJ) Inject as directed every 30 (thirty) days.    . furosemide (LASIX) 40 MG tablet Take 1 tablet (40 mg total) by mouth daily. 30 tablet 0  . glimepiride (AMARYL) 1 MG tablet Take 1 mg by mouth every morning.    Marland Kitchen lisinopril (PRINIVIL,ZESTRIL) 20 MG tablet Take 1 tablet (20 mg total) by mouth every evening. 60 tablet 0  . lithium carbonate (LITHOBID) 300 MG CR tablet Take 150 mg by mouth every morning.    . metoprolol (LOPRESSOR) 50 MG tablet Take 1 tablet (50 mg total) by mouth 2 (two) times  daily. 60 tablet 0  . mirtazapine (REMERON) 30 MG tablet Take 30 mg by mouth every evening.    . Mouthwashes (BL ANTISEPTIC MOUTH [place]) 5 mLs every 5 (five) hours as needed. GARGLE AND SWALLOW  2  . nitroGLYCERIN (NITROSTAT) 0.4 MG SL tablet Place 1 tablet (0.4 mg total) under the tongue every 5 (five) minutes as needed for chest pain. 30 tablet 0  . omeprazole (PRILOSEC) 40 MG capsule Take 40 mg by mouth every evening.     Marland Kitchen oxybutynin (DITROPAN) 5 MG tablet Take 2.5 mg by mouth 2 (two) times daily.     . pregabalin (LYRICA) 50 MG capsule Take 50 mg by mouth 2 (two) times daily.     .  risperiDONE (RISPERDAL) 1 MG tablet Take 0.5 mg by mouth every evening.     . traMADol (ULTRAM) 50 MG tablet Take 1 tablet (50 mg total) by mouth 2 (two) times daily as needed (pain). 30 tablet 0  . Vilazodone HCl 20 MG TABS Take 20 mg by mouth every morning.    . zolpidem (AMBIEN) 5 MG tablet Take 5 mg by mouth at bedtime.      No current facility-administered medications for this visit.    Allergies:    Allergies  Allergen Reactions  . Codeine Other (See Comments)    slurred speech    Social History:  The patient  reports that she has never smoked. She has never used smokeless tobacco.   Family history:   Family History  Problem Relation Age of Onset  . Arthritis Mother   . Heart attack Father     ROS:  Please see the history of present illness.  All other systems reviewed and negative.   PHYSICAL EXAM: VS:  BP 108/58 mmHg  Pulse 62  Ht 5\' 4"  (1.626 m) Well nourished, well developed, in no acute distress HEENT: Pupils are equal round react to light accommodation extraocular movements are intact.  Neck: no JVD Cardiac: Regular rate and rhythm without murmurs rubs or gallops. Lungs:  Mild crackles left side, no wheezing, rhonchi  Abd:  positive bowel sounds all quadrants,  Ext: no lower extremity edema.  2+ radial and dorsalis pedis pulses. Skin: warm and dry Neuro:  Grossly normal  EKG:  Normal sinus rhythm at 62 bpm  ASSESSMENT AND PLAN:  Problem List Items Addressed This Visit    None

## 2014-08-04 NOTE — Patient Instructions (Signed)
Medication Instructions:    Labwork:  BMET   Testing/Procedures:   Follow-Up:  Your physician wants you to follow-up in:  In 6 months  WITH DR Ellyn Hack  You will receive a reminder letter in the mail two months in advance. If you don't receive a letter, please call our office to schedule the follow-up appointment.   Any Other Special Instructions Will Be Listed Below (If Applicable).

## 2014-08-09 ENCOUNTER — Other Ambulatory Visit: Payer: Self-pay | Admitting: *Deleted

## 2014-08-09 ENCOUNTER — Telehealth: Payer: Self-pay | Admitting: *Deleted

## 2014-08-09 DIAGNOSIS — N183 Chronic kidney disease, stage 3 (moderate): Secondary | ICD-10-CM

## 2014-08-09 NOTE — Telephone Encounter (Signed)
-----   Message from Brett Canales, PA-C sent at 08/04/2014  4:17 PM EDT ----- Hold lasix for three days and resume at 20mg  daily.  Will need BMET in one week.  Please! Gaspar Bidding

## 2014-08-18 ENCOUNTER — Other Ambulatory Visit (INDEPENDENT_AMBULATORY_CARE_PROVIDER_SITE_OTHER): Payer: Medicare Other | Admitting: *Deleted

## 2014-08-18 DIAGNOSIS — N183 Chronic kidney disease, stage 3 (moderate): Secondary | ICD-10-CM | POA: Diagnosis not present

## 2014-08-18 LAB — BASIC METABOLIC PANEL
BUN: 27 mg/dL — AB (ref 6–23)
CALCIUM: 9.5 mg/dL (ref 8.4–10.5)
CHLORIDE: 107 meq/L (ref 96–112)
CO2: 26 mEq/L (ref 19–32)
CREATININE: 1.27 mg/dL — AB (ref 0.40–1.20)
GFR: 42.7 mL/min — AB (ref >60.00)
Glucose, Bld: 187 mg/dL — ABNORMAL HIGH (ref 70–99)
Potassium: 4.1 mEq/L (ref 3.5–5.1)
Sodium: 139 mEq/L (ref 135–145)

## 2014-08-18 NOTE — Addendum Note (Signed)
Addended by: Eulis Foster on: 08/18/2014 09:31 AM   Modules accepted: Orders

## 2014-08-20 ENCOUNTER — Encounter: Payer: Medicare Other | Admitting: Nurse Practitioner

## 2014-11-12 ENCOUNTER — Emergency Department (HOSPITAL_COMMUNITY): Payer: Medicare Other

## 2014-11-12 ENCOUNTER — Encounter (HOSPITAL_COMMUNITY): Payer: Self-pay

## 2014-11-12 ENCOUNTER — Emergency Department (HOSPITAL_COMMUNITY)
Admission: EM | Admit: 2014-11-12 | Discharge: 2014-11-12 | Disposition: A | Discharge status: Home / Self Care | Payer: Medicare Other | Attending: Emergency Medicine | Admitting: Emergency Medicine

## 2014-11-12 DIAGNOSIS — Z862 Personal history of diseases of the blood and blood-forming organs and certain disorders involving the immune mechanism: Secondary | ICD-10-CM | POA: Diagnosis not present

## 2014-11-12 DIAGNOSIS — Y998 Other external cause status: Secondary | ICD-10-CM | POA: Diagnosis not present

## 2014-11-12 DIAGNOSIS — Z8669 Personal history of other diseases of the nervous system and sense organs: Secondary | ICD-10-CM | POA: Insufficient documentation

## 2014-11-12 DIAGNOSIS — N390 Urinary tract infection, site not specified: Secondary | ICD-10-CM | POA: Diagnosis not present

## 2014-11-12 DIAGNOSIS — Z8739 Personal history of other diseases of the musculoskeletal system and connective tissue: Secondary | ICD-10-CM | POA: Insufficient documentation

## 2014-11-12 DIAGNOSIS — K219 Gastro-esophageal reflux disease without esophagitis: Secondary | ICD-10-CM | POA: Insufficient documentation

## 2014-11-12 DIAGNOSIS — Y9389 Activity, other specified: Secondary | ICD-10-CM | POA: Diagnosis not present

## 2014-11-12 DIAGNOSIS — G8929 Other chronic pain: Secondary | ICD-10-CM | POA: Diagnosis not present

## 2014-11-12 DIAGNOSIS — Z79899 Other long term (current) drug therapy: Secondary | ICD-10-CM | POA: Diagnosis not present

## 2014-11-12 DIAGNOSIS — E119 Type 2 diabetes mellitus without complications: Secondary | ICD-10-CM | POA: Insufficient documentation

## 2014-11-12 DIAGNOSIS — M25552 Pain in left hip: Secondary | ICD-10-CM

## 2014-11-12 DIAGNOSIS — Y9289 Other specified places as the place of occurrence of the external cause: Secondary | ICD-10-CM | POA: Insufficient documentation

## 2014-11-12 DIAGNOSIS — N183 Chronic kidney disease, stage 3 (moderate): Secondary | ICD-10-CM | POA: Diagnosis not present

## 2014-11-12 DIAGNOSIS — W19XXXA Unspecified fall, initial encounter: Secondary | ICD-10-CM

## 2014-11-12 DIAGNOSIS — E785 Hyperlipidemia, unspecified: Secondary | ICD-10-CM | POA: Insufficient documentation

## 2014-11-12 DIAGNOSIS — S79912A Unspecified injury of left hip, initial encounter: Secondary | ICD-10-CM | POA: Diagnosis not present

## 2014-11-12 DIAGNOSIS — I129 Hypertensive chronic kidney disease with stage 1 through stage 4 chronic kidney disease, or unspecified chronic kidney disease: Secondary | ICD-10-CM | POA: Insufficient documentation

## 2014-11-12 DIAGNOSIS — W1839XA Other fall on same level, initial encounter: Secondary | ICD-10-CM | POA: Diagnosis not present

## 2014-11-12 LAB — BASIC METABOLIC PANEL
Anion gap: 11 (ref 5–15)
BUN: 13 mg/dL (ref 6–20)
CO2: 27 mmol/L (ref 22–32)
CREATININE: 0.91 mg/dL (ref 0.44–1.00)
Calcium: 9.5 mg/dL (ref 8.9–10.3)
Chloride: 105 mmol/L (ref 101–111)
GFR calc Af Amer: >60 mL/min (ref >60)
GFR, EST NON AFRICAN AMERICAN: 57 mL/min — AB (ref >60)
Glucose, Bld: 178 mg/dL — ABNORMAL HIGH (ref 65–99)
POTASSIUM: 3.3 mmol/L — AB (ref 3.5–5.1)
SODIUM: 143 mmol/L (ref 135–145)

## 2014-11-12 LAB — URINALYSIS, ROUTINE W REFLEX MICROSCOPIC
BILIRUBIN URINE: NEGATIVE
Glucose, UA: NEGATIVE mg/dL
Ketones, ur: NEGATIVE mg/dL
NITRITE: NEGATIVE
PH: 6 (ref 5.0–8.0)
Protein, ur: 30 mg/dL — AB
SPECIFIC GRAVITY, URINE: 1.021 (ref 1.005–1.030)
Urobilinogen, UA: 0.2 mg/dL (ref 0.0–1.0)

## 2014-11-12 LAB — PROTIME-INR
INR: 1.13 (ref 0.00–1.49)
PROTHROMBIN TIME: 14.7 s (ref 11.6–15.2)

## 2014-11-12 LAB — URINE MICROSCOPIC-ADD ON

## 2014-11-12 LAB — CBC WITH DIFFERENTIAL/PLATELET
BASOS ABS: 0 10*3/uL (ref 0.0–0.1)
Basophils Relative: 0 % (ref 0–1)
EOS ABS: 0.1 10*3/uL (ref 0.0–0.7)
EOS PCT: 2 % (ref 0–5)
HCT: 39.2 % (ref 36.0–46.0)
Hemoglobin: 12.7 g/dL (ref 12.0–15.0)
Lymphocytes Relative: 36 % (ref 12–46)
Lymphs Abs: 2.7 10*3/uL (ref 0.7–4.0)
MCH: 29.1 pg (ref 26.0–34.0)
MCHC: 32.4 g/dL (ref 30.0–36.0)
MCV: 89.9 fL (ref 78.0–100.0)
Monocytes Absolute: 0.8 10*3/uL (ref 0.1–1.0)
Monocytes Relative: 10 % (ref 3–12)
Neutro Abs: 3.8 10*3/uL (ref 1.7–7.7)
Neutrophils Relative %: 52 % (ref 43–77)
PLATELETS: 172 10*3/uL (ref 150–400)
RBC: 4.36 MIL/uL (ref 3.87–5.11)
RDW: 13.5 % (ref 11.5–15.5)
WBC: 7.3 10*3/uL (ref 4.0–10.5)

## 2014-11-12 LAB — TYPE AND SCREEN
ABO/RH(D): A POS
ANTIBODY SCREEN: NEGATIVE

## 2014-11-12 MED ORDER — CEFTRIAXONE SODIUM 1 G IJ SOLR
1.0000 g | Freq: Once | INTRAMUSCULAR | Status: AC
  Administered 2014-11-12: 1 g via INTRAMUSCULAR
  Filled 2014-11-12: qty 10

## 2014-11-12 MED ORDER — LIDOCAINE HCL (PF) 1 % IJ SOLN
5.0000 mL | Freq: Once | INTRAMUSCULAR | Status: AC
  Administered 2014-11-12: 5 mL
  Filled 2014-11-12: qty 5

## 2014-11-12 MED ORDER — CEPHALEXIN 500 MG PO CAPS: 500.0000 mg | ORAL_CAPSULE | Freq: Two times a day (BID) | ORAL | Status: DC

## 2014-11-12 NOTE — Discharge Instructions (Signed)
You were seen today for a fall. Your x-rays are reassuring. You may have the beginning of urinary tract infection. You'll be given antibiotics.  Fall Prevention and Home Safety Falls cause injuries and can affect all age groups. It is possible to use preventive measures to significantly decrease the likelihood of falls. There are many simple measures which can make your home safer and prevent falls. OUTDOORS  Repair cracks and edges of walkways and driveways.  Remove high doorway thresholds.  Trim shrubbery on the main path into your home.  Have good outside lighting.  Clear walkways of tools, rocks, debris, and clutter.  Check that handrails are not broken and are securely fastened. Both sides of steps should have handrails.  Have leaves, snow, and ice cleared regularly.  Use sand or salt on walkways during winter months.  In the garage, clean up grease or oil spills. BATHROOM  Install night lights.  Install grab bars by the toilet and in the tub and shower.  Use non-skid mats or decals in the tub or shower.  Place a plastic non-slip stool in the shower to sit on, if needed.  Keep floors dry and clean up all water on the floor immediately.  Remove soap buildup in the tub or shower on a regular basis.  Secure bath mats with non-slip, double-sided rug tape.  Remove throw rugs and tripping hazards from the floors. BEDROOMS  Install night lights.  Make sure a bedside light is easy to reach.  Do not use oversized bedding.  Keep a telephone by your bedside.  Have a firm chair with side arms to use for getting dressed.  Remove throw rugs and tripping hazards from the floor. KITCHEN  Keep handles on pots and pans turned toward the center of the stove. Use back burners when possible.  Clean up spills quickly and allow time for drying.  Avoid walking on wet floors.  Avoid hot utensils and knives.  Position shelves so they are not too high or low.  Place  commonly used objects within easy reach.  If necessary, use a sturdy step stool with a grab bar when reaching.  Keep electrical cables out of the way.  Do not use floor polish or wax that makes floors slippery. If you must use wax, use non-skid floor wax.  Remove throw rugs and tripping hazards from the floor. STAIRWAYS  Never leave objects on stairs.  Place handrails on both sides of stairways and use them. Fix any loose handrails. Make sure handrails on both sides of the stairways are as long as the stairs.  Check carpeting to make sure it is firmly attached along stairs. Make repairs to worn or loose carpet promptly.  Avoid placing throw rugs at the top or bottom of stairways, or properly secure the rug with carpet tape to prevent slippage. Get rid of throw rugs, if possible.  Have an electrician put in a light switch at the top and bottom of the stairs. OTHER FALL PREVENTION TIPS  Wear low-heel or rubber-soled shoes that are supportive and fit well. Wear closed toe shoes.  When using a stepladder, make sure it is fully opened and both spreaders are firmly locked. Do not climb a closed stepladder.  Add color or contrast paint or tape to grab bars and handrails in your home. Place contrasting color strips on first and last steps.  Learn and use mobility aids as needed. Install an electrical emergency response system.  Turn on lights to avoid dark areas.  Replace light bulbs that burn out immediately. Get light switches that glow.  Arrange furniture to create clear pathways. Keep furniture in the same place.  Firmly attach carpet with non-skid or double-sided tape.  Eliminate uneven floor surfaces.  Select a carpet pattern that does not visually hide the edge of steps.  Be aware of all pets. OTHER HOME SAFETY TIPS  Set the water temperature for 120 F (48.8 C).  Keep emergency numbers on or near the telephone.  Keep smoke detectors on every level of the home and near  sleeping areas. Document Released: 02/09/2002 Document Revised: 08/21/2011 Document Reviewed: 05/11/2011 [facility] Patient Information 2015 [place], [place]. This information is not intended to replace advice given to you by your health care provider. Make sure you discuss any questions you have with your health care provider.   Urinary Tract Infection Urinary tract infections (UTIs) can develop anywhere along your urinary tract. Your urinary tract is your body's drainage system for removing wastes and extra water. Your urinary tract includes two kidneys, two ureters, a bladder, and a urethra. Your kidneys are a pair of bean-shaped organs. Each kidney is about the size of your fist. They are located below your ribs, one on each side of your spine. CAUSES Infections are caused by microbes, which are microscopic organisms, including fungi, viruses, and bacteria. These organisms are so small that they can only be seen through a microscope. Bacteria are the microbes that most commonly cause UTIs. SYMPTOMS  Symptoms of UTIs may vary by age and gender of the patient and by the location of the infection. Symptoms in young women typically include a frequent and intense urge to urinate and a painful, burning feeling in the bladder or urethra during urination. Older women and men are more likely to be tired, shaky, and weak and have muscle aches and abdominal pain. A fever may mean the infection is in your kidneys. Other symptoms of a kidney infection include pain in your back or sides below the ribs, nausea, and vomiting. DIAGNOSIS To diagnose a UTI, your caregiver will ask you about your symptoms. Your caregiver also will ask to provide a urine sample. The urine sample will be tested for bacteria and white blood cells. White blood cells are made by your body to help fight infection. TREATMENT  Typically, UTIs can be treated with medication. Because most UTIs are caused by a bacterial infection, they usually can  be treated with the use of antibiotics. The choice of antibiotic and length of treatment depend on your symptoms and the type of bacteria causing your infection. HOME CARE INSTRUCTIONS  If you were prescribed antibiotics, take them exactly as your caregiver instructs you. Finish the medication even if you feel better after you have only taken some of the medication.  Drink enough water and fluids to keep your urine clear or pale yellow.  Avoid caffeine, tea, and carbonated beverages. They tend to irritate your bladder.  Empty your bladder often. Avoid holding urine for long periods of time.  Empty your bladder before and after sexual intercourse.  After a bowel movement, women should cleanse from front to back. Use each tissue only once. SEEK MEDICAL CARE IF:   You have back pain.  You develop a fever.  Your symptoms do not begin to resolve within 3 days. SEEK IMMEDIATE MEDICAL CARE IF:   You have severe back pain or lower abdominal pain.  You develop chills.  You have nausea or vomiting.  You have continued burning  or discomfort with urination. MAKE SURE YOU:   Understand these instructions.  Will watch your condition.  Will get help right away if you are not doing well or get worse. Document Released: 11/29/2004 Document Revised: 08/21/2011 Document Reviewed: 03/30/2011 [facility] Patient Information 2015 [place], [place]. This information is not intended to replace advice given to you by your health care provider. Make sure you discuss any questions you have with your health care provider.

## 2014-11-12 NOTE — ED Notes (Signed)
Patient transported to X-ray 

## 2014-11-12 NOTE — ED Provider Notes (Signed)
CSN: [id number]     Arrival date & time 11/12/14  0344 History   This chart was scribed for Merryl Hacker, MD by Evelene Croon, ED Scribe. This patient was seen in room B17C/B17C and the patient's care was started 3:48 AM.    Chief Complaint  Patient presents with  . Fall  . Hip Pain   The history is provided by the patient and the EMS personnel. No language interpreter was used.     HPI Comments:  KERIANA SARSFIELD is a 81 y.o. female brought in by ambulance, who presents to the Emergency Department s/p fall this AM complaining of 9/10 left hip pain. Pt states her LLE gave way and she fell. She reports a h/o the same,with 3 falls in the last 3 weeks. Patient reports hip replacement on the left.  She is supposed to walk with a walker but does not.  Pt was unable to get up by her herself, she was found on the floor by husband who called EMS. She denies CP, SOB, LOC,head injury, HA and neck pain. No alleviating factors noted. Pt arrived on backboard.   Past Medical History  Diagnosis Date  . Diabetes mellitus without complication   . Hypertension   . CKD (chronic kidney disease), stage III   . PVC (premature ventricular contraction)   . Hyperlipidemia   . GERD (gastroesophageal reflux disease)   . Osteopenia   . Chronic low back pain   . Postlaminectomy syndrome   . Right carpal tunnel syndrome   . Pernicious anemia   . Chronic interstitial cystitis   . Microhematuria   . Macular degeneration   . Chest pain     a. 07/2014 Neg MV, nl EF.   History reviewed. No pertinent past surgical history. Family History  Problem Relation Age of Onset  . Arthritis Mother   . Heart attack Father    Social History  Substance Use Topics  . Smoking status: Never Smoker   . Smokeless tobacco: Never Used  . Alcohol Use: No   OB History    No data available     Review of Systems  Constitutional: Negative for fever.  Respiratory: Negative for chest tightness and shortness of breath.    Cardiovascular: Negative for chest pain.  Gastrointestinal: Negative for nausea, vomiting and abdominal pain.  Genitourinary: Negative for dysuria.  Musculoskeletal: Positive for back pain.       Left hip pain  Skin: Negative for wound.  Neurological: Negative for syncope, light-headedness and headaches.  Psychiatric/Behavioral: Negative for confusion.  All other systems reviewed and are negative.     Allergies  Codeine  Home Medications   Prior to Admission medications   Medication Sig Start Date End Date Taking? Authorizing Provider  acetaminophen (TYLENOL) 500 MG tablet Take 500 mg by mouth every 6 (six) hours as needed for mild pain or moderate pain.    Yes Historical Provider, MD  atorvastatin (LIPITOR) 10 MG tablet Take 10 mg by mouth every morning.    Yes Historical Provider, MD  cholecalciferol (VITAMIN D) 1000 UNITS tablet Take 2,000 Units by mouth every morning.   Yes Historical Provider, MD  conjugated estrogens (PREMARIN) vaginal cream Place 1 Applicatorful vaginally as needed (burning).   Yes Historical Provider, MD  Cyanocobalamin (B-12 COMPLIANCE INJECTION IJ) Inject as directed every 30 (thirty) days.   Yes Historical Provider, MD  furosemide (LASIX) 40 MG tablet Take 1 tablet (40 mg total) by mouth daily. 07/22/14  Yes Maryann  Mikhail, DO  glimepiride (AMARYL) 1 MG tablet Take 1 mg by mouth every morning.   Yes Historical Provider, MD  lisinopril (PRINIVIL,ZESTRIL) 20 MG tablet Take 1 tablet (20 mg total) by mouth every evening. 07/22/14  Yes Maryann Mikhail, DO  lithium carbonate (LITHOBID) 300 MG CR tablet Take 150 mg by mouth every morning.   Yes Historical Provider, MD  metoprolol (LOPRESSOR) 50 MG tablet Take 1 tablet (50 mg total) by mouth 2 (two) times daily. 07/22/14  Yes Maryann Mikhail, DO  mirtazapine (REMERON) 30 MG tablet Take 30 mg by mouth every evening.   Yes Historical Provider, MD  nitroGLYCERIN (NITROSTAT) 0.4 MG SL tablet Place 1 tablet (0.4 mg  total) under the tongue every 5 (five) minutes as needed for chest pain. 07/22/14  Yes Maryann Mikhail, DO  omeprazole (PRILOSEC) 40 MG capsule Take 40 mg by mouth every evening.    Yes Historical Provider, MD  oxybutynin (DITROPAN) 5 MG tablet Take 2.5 mg by mouth 2 (two) times daily.    Yes Historical Provider, MD  pregabalin (LYRICA) 75 MG capsule Take 75 mg by mouth 2 (two) times daily.   Yes Historical Provider, MD  risperiDONE (RISPERDAL) 1 MG tablet Take 0.5 mg by mouth every evening.    Yes Historical Provider, MD  traMADol (ULTRAM) 50 MG tablet Take 1 tablet (50 mg total) by mouth 2 (two) times daily as needed (pain). 07/22/14  Yes Maryann Mikhail, DO  Vilazodone HCl 20 MG TABS Take 20 mg by mouth every morning.   Yes Historical Provider, MD  zolpidem (AMBIEN) 5 MG tablet Take 5 mg by mouth at bedtime.    Yes Historical Provider, MD  alum & mag hydroxide-simeth (MAALOX/MYLANTA) 200-200-20 MG/5ML suspension Take 30 mLs by mouth every 4 (four) hours as needed for indigestion or heartburn. 07/22/14   Maryann Mikhail, DO  cephALEXin (KEFLEX) 500 MG capsule Take 1 capsule (500 mg total) by mouth 2 (two) times daily. 11/12/14   Merryl Hacker, MD  Mouthwashes (BL ANTISEPTIC MOUTH [place]) 5 mLs every 5 (five) hours as needed. GARGLE AND SWALLOW 05/13/14   Historical Provider, MD  pregabalin (LYRICA) 50 MG capsule Take 50 mg by mouth 2 (two) times daily.     Historical Provider, MD   BP 187/83 mmHg  Pulse 84  Resp 20  SpO2 98% Physical Exam  Constitutional: She is oriented to person, place, and time. She appears well-developed and well-nourished. No distress.  Elderly  HENT:  Head: Normocephalic and atraumatic.  Mouth/Throat: Oropharynx is clear and moist.  Eyes: Pupils are equal, round, and reactive to light.  Neck: Normal range of motion. Neck supple.  No midline C-spine tenderness  Cardiovascular: Normal rate, regular rhythm and normal heart sounds.   No murmur heard. Pulmonary/Chest:  Effort normal and breath sounds normal. No respiratory distress. She has no wheezes.  Abdominal: Soft. Bowel sounds are normal. There is no tenderness. There is no rebound.  Musculoskeletal:  Limited range of motion of the left hip secondary to pain, no obvious foreshortening, 2+ DP pulses bilaterally, neurovascularly intact, no other deformities noted  Neurological: She is alert and oriented to person, place, and time.  Skin: Skin is warm and dry.  Psychiatric: She has a normal mood and affect.  Nursing note and vitals reviewed.   ED Course  Procedures   DIAGNOSTIC STUDIES:  Oxygen Saturation is 97% on RA, normal by my interpretation.    COORDINATION OF CARE:  3:53 AM Discussed treatment plan  with pt at bedside and pt agreed to plan.  Labs Review  Labs Reviewed  BASIC METABOLIC PANEL - Abnormal; Notable for the following:    Potassium 3.3 (*)    Glucose, Bld 178 (*)    GFR calc non Af Amer 57 (*)    All other components within normal limits  URINALYSIS, ROUTINE W REFLEX MICROSCOPIC (NOT AT [facility]) - Abnormal; Notable for the following:    APPearance CLOUDY (*)    Hgb urine dipstick TRACE (*)    Protein, ur 30 (*)    Leukocytes, UA SMALL (*)    All other components within normal limits  URINE MICROSCOPIC-ADD ON - Abnormal; Notable for the following:    Bacteria, UA MANY (*)    Casts HYALINE CASTS (*)    All other components within normal limits  URINE CULTURE  CBC WITH DIFFERENTIAL/PLATELET  PROTIME-INR  TYPE AND SCREEN    Imaging Review Dg Lumbar Spine Complete  11/12/2014   CLINICAL DATA:  Left hip pain after a fall.  EXAM: LUMBAR SPINE - COMPLETE 4+ VIEW  COMPARISON:  Lumbar spine 01/06/2008.  MRI lumbar spine 12/15/2007  FINDINGS: Degenerative changes in the lumbar spine with narrowed lumbar interspaces and associated endplate hypertrophic changes. Mild scoliosis convex towards the left. Prominent endplate sclerosis from L2 through L5. No anterior subluxation. No  vertebral compression. Visualized bone cortex appears intact.  IMPRESSION: Degenerative changes in the lumbar spine. No acute displaced fractures identified.   Electronically Signed   By: Lucienne Capers M.D.   On: 11/12/2014 05:12   Dg Hip Unilat With Pelvis 1v Left  11/12/2014   CLINICAL DATA:  Patient fell after wandering around. Patient was found on the floor. Hip pain.  EXAM: DG HIP (WITH OR WITHOUT PELVIS) 1V*L*  COMPARISON:  Left hip 04/25/2010  FINDINGS: Postoperative change with previous bipolar left hip arthroplasty using non cemented femoral component. Components appear well seated. No evidence of acute fracture or dislocation in the left hip. Heterotopic ossification around the greater trochanter. Pelvis appears intact without evidence of acute fracture or dislocation. SI joints and symphysis pubis are not displaced.  IMPRESSION: No acute bony abnormalities. Postoperative changes of left hip arthroplasty with heterotopic ossification around the greater trochanter.   Electronically Signed   By: Lucienne Capers M.D.   On: 11/12/2014 05:10   I have personally reviewed and evaluated these images and lab results as part of my medical decision-making.   EKG Interpretation   Date/Time:  Friday November 12 2014 03:59:58 EDT Ventricular Rate:  82 PR Interval:    QRS Duration: 101 QT Interval:  422 QTC Calculation: 493 R Axis:   -13 Text Interpretation:  Normal sinus rhythm Abnormal R-wave progression,  early transition Borderline T abnormalities, inferior leads Borderline  prolonged QT interval Confirmed by Aubriella Perezgarcia  MD, [place] ([id number]) on  11/12/2014 4:04:00 AM      MDM   Final diagnoses:  Fall, initial encounter  Left hip pain  UTI (lower urinary tract infection)   Patient presents following a fall. Reports frequent recent falls. Denies syncope. Denies weakness but does report that her left leg has been "giving out."  Nontoxic and ABCs intact.  Limited range of motion left hip  secondary to pain. No obvious foreshortening. Lab work obtained. Urinalysis with evidence of possible early urinary tract infection with 7-10 white cells and many bacteria. Urine culture sent. Other workup reassuring. X-rays negative for acute fracture. Patient was able to angulate her baseline with a walker.  Discussed with patient the importance of ambulating with her walker. She should follow-up with her primary physician.  After history, exam, and medical workup I feel the patient has been appropriately medically screened and is safe for discharge home. Pertinent diagnoses were discussed with the patient. Patient was given return precautions.  I personally performed the services described in this documentation, which was scribed in my presence. The recorded information has been reviewed and is accurate.    Merryl Hacker, MD 11/12/14 [phone]

## 2014-11-12 NOTE — ED Notes (Signed)
Dr. Horton at the bedside.  

## 2014-11-12 NOTE — ED Notes (Signed)
Lab at the bedside 

## 2014-11-12 NOTE — ED Notes (Signed)
Ambulated in the hallway with walker. No difficulty

## 2014-11-12 NOTE — ED Notes (Signed)
Per [facility], pt fell after wandering around. Husband found her on the floor and she had been there for about 3 hours. States she typically gets sundowners and has temors which is normal. Pt states her legs just gave way and she fell. Pt denies any LOC.

## 2014-11-12 NOTE — ED Notes (Signed)
Spoke to [place], daughter on phone and she is going to come get the patient.

## 2014-11-13 LAB — URINE CULTURE

## 2014-11-18 ENCOUNTER — Inpatient Hospital Stay (HOSPITAL_COMMUNITY): Payer: Medicare Other

## 2014-11-18 ENCOUNTER — Encounter (HOSPITAL_COMMUNITY): Payer: Self-pay | Admitting: Emergency Medicine

## 2014-11-18 ENCOUNTER — Emergency Department (HOSPITAL_COMMUNITY): Payer: Medicare Other

## 2014-11-18 ENCOUNTER — Inpatient Hospital Stay (HOSPITAL_COMMUNITY)
Admission: EM | Admit: 2014-11-18 | Discharge: 2014-11-21 | DRG: 078 | Disposition: A | Discharge status: Home Health | Payer: Medicare Other | Attending: Internal Medicine | Admitting: Internal Medicine

## 2014-11-18 DIAGNOSIS — G8929 Other chronic pain: Secondary | ICD-10-CM | POA: Diagnosis present

## 2014-11-18 DIAGNOSIS — I129 Hypertensive chronic kidney disease with stage 1 through stage 4 chronic kidney disease, or unspecified chronic kidney disease: Secondary | ICD-10-CM | POA: Diagnosis present

## 2014-11-18 DIAGNOSIS — Z66 Do not resuscitate: Secondary | ICD-10-CM | POA: Diagnosis present

## 2014-11-18 DIAGNOSIS — Z885 Allergy status to narcotic agent status: Secondary | ICD-10-CM | POA: Diagnosis not present

## 2014-11-18 DIAGNOSIS — R4182 Altered mental status, unspecified: Secondary | ICD-10-CM | POA: Diagnosis not present

## 2014-11-18 DIAGNOSIS — F319 Bipolar disorder, unspecified: Secondary | ICD-10-CM | POA: Diagnosis present

## 2014-11-18 DIAGNOSIS — I5032 Chronic diastolic (congestive) heart failure: Secondary | ICD-10-CM | POA: Diagnosis present

## 2014-11-18 DIAGNOSIS — N183 Chronic kidney disease, stage 3 unspecified: Secondary | ICD-10-CM

## 2014-11-18 DIAGNOSIS — K219 Gastro-esophageal reflux disease without esophagitis: Secondary | ICD-10-CM | POA: Diagnosis present

## 2014-11-18 DIAGNOSIS — N3010 Interstitial cystitis (chronic) without hematuria: Secondary | ICD-10-CM | POA: Diagnosis present

## 2014-11-18 DIAGNOSIS — N390 Urinary tract infection, site not specified: Secondary | ICD-10-CM | POA: Diagnosis present

## 2014-11-18 DIAGNOSIS — R7401 Elevation of levels of liver transaminase levels: Secondary | ICD-10-CM

## 2014-11-18 DIAGNOSIS — E1165 Type 2 diabetes mellitus with hyperglycemia: Secondary | ICD-10-CM

## 2014-11-18 DIAGNOSIS — R471 Dysarthria and anarthria: Secondary | ICD-10-CM | POA: Diagnosis present

## 2014-11-18 DIAGNOSIS — E785 Hyperlipidemia, unspecified: Secondary | ICD-10-CM | POA: Diagnosis present

## 2014-11-18 DIAGNOSIS — Z780 Asymptomatic menopausal state: Secondary | ICD-10-CM | POA: Diagnosis not present

## 2014-11-18 DIAGNOSIS — G459 Transient cerebral ischemic attack, unspecified: Secondary | ICD-10-CM | POA: Diagnosis present

## 2014-11-18 DIAGNOSIS — Z9181 History of falling: Secondary | ICD-10-CM | POA: Diagnosis not present

## 2014-11-18 DIAGNOSIS — G9340 Encephalopathy, unspecified: Secondary | ICD-10-CM | POA: Diagnosis not present

## 2014-11-18 DIAGNOSIS — I6789 Other cerebrovascular disease: Secondary | ICD-10-CM | POA: Diagnosis not present

## 2014-11-18 DIAGNOSIS — I674 Hypertensive encephalopathy: Principal | ICD-10-CM | POA: Diagnosis present

## 2014-11-18 DIAGNOSIS — N179 Acute kidney failure, unspecified: Secondary | ICD-10-CM | POA: Diagnosis not present

## 2014-11-18 DIAGNOSIS — Z79899 Other long term (current) drug therapy: Secondary | ICD-10-CM | POA: Diagnosis not present

## 2014-11-18 DIAGNOSIS — H3530 Unspecified macular degeneration: Secondary | ICD-10-CM | POA: Diagnosis present

## 2014-11-18 DIAGNOSIS — Z6833 Body mass index (BMI) 33.0-33.9, adult: Secondary | ICD-10-CM | POA: Diagnosis not present

## 2014-11-18 DIAGNOSIS — I10 Essential (primary) hypertension: Secondary | ICD-10-CM

## 2014-11-18 DIAGNOSIS — R740 Nonspecific elevation of levels of transaminase and lactic acid dehydrogenase [LDH]: Secondary | ICD-10-CM

## 2014-11-18 DIAGNOSIS — E1122 Type 2 diabetes mellitus with diabetic chronic kidney disease: Secondary | ICD-10-CM | POA: Diagnosis present

## 2014-11-18 DIAGNOSIS — E669 Obesity, unspecified: Secondary | ICD-10-CM | POA: Diagnosis present

## 2014-11-18 DIAGNOSIS — R072 Precordial pain: Secondary | ICD-10-CM

## 2014-11-18 DIAGNOSIS — R4701 Aphasia: Secondary | ICD-10-CM | POA: Diagnosis present

## 2014-11-18 DIAGNOSIS — I639 Cerebral infarction, unspecified: Secondary | ICD-10-CM

## 2014-11-18 DIAGNOSIS — R079 Chest pain, unspecified: Secondary | ICD-10-CM

## 2014-11-18 LAB — COMPREHENSIVE METABOLIC PANEL
ALBUMIN: 3.8 g/dL (ref 3.5–5.0)
ALK PHOS: 114 U/L (ref 38–126)
ALT: 172 U/L — ABNORMAL HIGH (ref 14–54)
ANION GAP: 8 (ref 5–15)
AST: 40 U/L (ref 15–41)
BUN: 22 mg/dL — ABNORMAL HIGH (ref 6–20)
CALCIUM: 10 mg/dL (ref 8.9–10.3)
CO2: 30 mmol/L (ref 22–32)
Chloride: 101 mmol/L (ref 101–111)
Creatinine, Ser: 1.11 mg/dL — ABNORMAL HIGH (ref 0.44–1.00)
GFR calc Af Amer: 52 mL/min — ABNORMAL LOW (ref >60)
GFR calc non Af Amer: 45 mL/min — ABNORMAL LOW (ref >60)
GLUCOSE: 152 mg/dL — AB (ref 65–99)
Potassium: 4.6 mmol/L (ref 3.5–5.1)
SODIUM: 139 mmol/L (ref 135–145)
Total Bilirubin: 0.3 mg/dL (ref 0.3–1.2)
Total Protein: 6.9 g/dL (ref 6.5–8.1)

## 2014-11-18 LAB — CBC
HCT: 43.7 % (ref 36.0–46.0)
Hemoglobin: 14 g/dL (ref 12.0–15.0)
MCH: 29.4 pg (ref 26.0–34.0)
MCHC: 32 g/dL (ref 30.0–36.0)
MCV: 91.8 fL (ref 78.0–100.0)
PLATELETS: 238 10*3/uL (ref 150–400)
RBC: 4.76 MIL/uL (ref 3.87–5.11)
RDW: 13.3 % (ref 11.5–15.5)
WBC: 7.8 10*3/uL (ref 4.0–10.5)

## 2014-11-18 LAB — I-STAT CHEM 8, ED
BUN: 27 mg/dL — ABNORMAL HIGH (ref 6–20)
CHLORIDE: 100 mmol/L — AB (ref 101–111)
Calcium, Ion: 1.21 mmol/L (ref 1.13–1.30)
Creatinine, Ser: 1.1 mg/dL — ABNORMAL HIGH (ref 0.44–1.00)
GLUCOSE: 151 mg/dL — AB (ref 65–99)
HEMATOCRIT: 46 % (ref 36.0–46.0)
Hemoglobin: 15.6 g/dL — ABNORMAL HIGH (ref 12.0–15.0)
POTASSIUM: 4.4 mmol/L (ref 3.5–5.1)
SODIUM: 140 mmol/L (ref 135–145)
TCO2: 30 mmol/L (ref 0–100)

## 2014-11-18 LAB — PROTIME-INR
INR: 1.06 (ref 0.00–1.49)
PROTHROMBIN TIME: 14 s (ref 11.6–15.2)

## 2014-11-18 LAB — APTT: APTT: 32 s (ref 24–37)

## 2014-11-18 LAB — DIFFERENTIAL
Basophils Absolute: 0 10*3/uL (ref 0.0–0.1)
Basophils Relative: 0 %
EOS PCT: 1 %
Eosinophils Absolute: 0.1 10*3/uL (ref 0.0–0.7)
LYMPHS PCT: 39 %
Lymphs Abs: 3 10*3/uL (ref 0.7–4.0)
Monocytes Absolute: 0.7 10*3/uL (ref 0.1–1.0)
Monocytes Relative: 9 %
NEUTROS ABS: 4 10*3/uL (ref 1.7–7.7)
NEUTROS PCT: 51 %

## 2014-11-18 LAB — I-STAT TROPONIN, ED: Troponin i, poc: 0 ng/mL (ref 0.00–0.08)

## 2014-11-18 LAB — CBG MONITORING, ED: GLUCOSE-CAPILLARY: 151 mg/dL — AB (ref 65–99)

## 2014-11-18 MED ORDER — LITHIUM CARBONATE 150 MG PO CAPS
150.0000 mg | ORAL_CAPSULE | Freq: Every day | ORAL | Status: DC
  Administered 2014-11-19 – 2014-11-21 (×3): 150 mg via ORAL
  Filled 2014-11-18 (×7): qty 1

## 2014-11-18 MED ORDER — RISPERIDONE 0.5 MG PO TABS
0.5000 mg | ORAL_TABLET | Freq: Every evening | ORAL | Status: DC
  Administered 2014-11-18 – 2014-11-20 (×3): 0.5 mg via ORAL
  Filled 2014-11-18 (×3): qty 1

## 2014-11-18 MED ORDER — ENOXAPARIN SODIUM 30 MG/0.3ML ~~LOC~~ SOLN
30.0000 mg | SUBCUTANEOUS | Status: DC
  Administered 2014-11-18: 30 mg via SUBCUTANEOUS
  Filled 2014-11-18: qty 0.3

## 2014-11-18 MED ORDER — ZOLPIDEM TARTRATE 5 MG PO TABS
5.0000 mg | ORAL_TABLET | Freq: Every day | ORAL | Status: DC
  Administered 2014-11-18 – 2014-11-20 (×3): 5 mg via ORAL
  Filled 2014-11-18 (×3): qty 1

## 2014-11-18 MED ORDER — OXYBUTYNIN CHLORIDE 5 MG PO TABS
2.5000 mg | ORAL_TABLET | Freq: Two times a day (BID) | ORAL | Status: DC
  Administered 2014-11-18 – 2014-11-21 (×6): 2.5 mg via ORAL
  Filled 2014-11-18 (×6): qty 1

## 2014-11-18 MED ORDER — VILAZODONE HCL 20 MG PO TABS
20.0000 mg | ORAL_TABLET | ORAL | Status: DC
  Administered 2014-11-19 – 2014-11-21 (×3): 20 mg via ORAL
  Filled 2014-11-18 (×4): qty 1

## 2014-11-18 MED ORDER — TRAMADOL HCL 50 MG PO TABS
50.0000 mg | ORAL_TABLET | Freq: Two times a day (BID) | ORAL | Status: DC
  Administered 2014-11-18 – 2014-11-21 (×6): 50 mg via ORAL
  Filled 2014-11-18 (×6): qty 1

## 2014-11-18 MED ORDER — FUROSEMIDE 40 MG PO TABS
40.0000 mg | ORAL_TABLET | Freq: Every day | ORAL | Status: DC
  Administered 2014-11-19 – 2014-11-20 (×2): 40 mg via ORAL
  Filled 2014-11-18 (×2): qty 1

## 2014-11-18 MED ORDER — LITHIUM CARBONATE ER 300 MG PO TBCR: 150.0000 mg | EXTENDED_RELEASE_TABLET | ORAL | Status: DC

## 2014-11-18 MED ORDER — STROKE: EARLY STAGES OF RECOVERY BOOK
Freq: Once | Status: AC
  Administered 2014-11-18: 21:00:00

## 2014-11-18 MED ORDER — PREGABALIN 75 MG PO CAPS
75.0000 mg | ORAL_CAPSULE | Freq: Two times a day (BID) | ORAL | Status: DC
  Administered 2014-11-18 – 2014-11-21 (×6): 75 mg via ORAL
  Filled 2014-11-18 (×7): qty 1

## 2014-11-18 MED ORDER — PANTOPRAZOLE SODIUM 40 MG PO TBEC
40.0000 mg | DELAYED_RELEASE_TABLET | Freq: Every day | ORAL | Status: DC
  Administered 2014-11-18 – 2014-11-21 (×4): 40 mg via ORAL
  Filled 2014-11-18 (×4): qty 1

## 2014-11-18 MED ORDER — MIRTAZAPINE 15 MG PO TABS
30.0000 mg | ORAL_TABLET | Freq: Every evening | ORAL | Status: DC
  Administered 2014-11-18 – 2014-11-20 (×3): 30 mg via ORAL
  Filled 2014-11-18 (×3): qty 2

## 2014-11-18 MED ORDER — METOPROLOL TARTRATE 50 MG PO TABS
50.0000 mg | ORAL_TABLET | Freq: Two times a day (BID) | ORAL | Status: DC
  Administered 2014-11-18 – 2014-11-21 (×6): 50 mg via ORAL
  Filled 2014-11-18 (×6): qty 1

## 2014-11-18 MED ORDER — ESTROGENS, CONJUGATED 0.625 MG/GM VA CREA: 1.0000 | TOPICAL_CREAM | VAGINAL | Status: DC | PRN

## 2014-11-18 MED ORDER — SENNOSIDES-DOCUSATE SODIUM 8.6-50 MG PO TABS: 1.0000 | ORAL_TABLET | Freq: Every evening | ORAL | Status: DC | PRN

## 2014-11-18 MED ORDER — VILAZODONE HCL 20 MG PO TABS: 20.0000 mg | ORAL_TABLET | ORAL | Status: DC

## 2014-11-18 NOTE — Progress Notes (Signed)
Pt BP 208/47 mmHg. MD paged, continuing to monitor. Hassan Rowan, RN

## 2014-11-18 NOTE — ED Provider Notes (Signed)
CSN: [id number]     Arrival date & time 11/18/14  1430 History   First MD Initiated Contact with Patient 11/18/14 1453     Chief Complaint  Patient presents with  . Stroke Symptoms     (Consider location/radiation/quality/duration/timing/severity/associated sxs/prior Treatment) HPI Comments: Patient is an 81 year old female with past medical history of hypertension, atrial flutter, diabetes, renal insufficiency, arthritis. She presents for evaluation of slurred speech and facial droop. This started sometime last night. She was last seen and spoken to yesterday evening and was normal. When she woke up this morning, caregivers noted that she was exhibiting the symptoms. The patient adds little to history due to her speech difficulty.  The history is provided by the patient.    Past Medical History  Diagnosis Date  . Diabetes mellitus without complication   . Hypertension   . CKD (chronic kidney disease), stage III   . PVC (premature ventricular contraction)   . Hyperlipidemia   . GERD (gastroesophageal reflux disease)   . Osteopenia   . Chronic low back pain   . Postlaminectomy syndrome   . Right carpal tunnel syndrome   . Pernicious anemia   . Chronic interstitial cystitis   . Microhematuria   . Macular degeneration   . Chest pain     a. 07/2014 Neg MV, nl EF.   History reviewed. No pertinent past surgical history. Family History  Problem Relation Age of Onset  . Arthritis Mother   . Heart attack Father    Social History  Substance Use Topics  . Smoking status: Never Smoker   . Smokeless tobacco: Never Used  . Alcohol Use: No   OB History    No data available     Review of Systems  All other systems reviewed and are negative.     Allergies  Codeine  Home Medications   Prior to Admission medications   Medication Sig Start Date End Date Taking? Authorizing Provider  acetaminophen (TYLENOL) 500 MG tablet Take 500 mg by mouth every 6 (six) hours as needed for  mild pain or moderate pain.     Historical Provider, MD  alum & mag hydroxide-simeth (MAALOX/MYLANTA) 200-200-20 MG/5ML suspension Take 30 mLs by mouth every 4 (four) hours as needed for indigestion or heartburn. 07/22/14   Maryann Mikhail, DO  atorvastatin (LIPITOR) 10 MG tablet Take 10 mg by mouth every morning.     Historical Provider, MD  cephALEXin (KEFLEX) 500 MG capsule Take 1 capsule (500 mg total) by mouth 2 (two) times daily. 11/12/14   Merryl Hacker, MD  cholecalciferol (VITAMIN D) 1000 UNITS tablet Take 2,000 Units by mouth every morning.    Historical Provider, MD  conjugated estrogens (PREMARIN) vaginal cream Place 1 Applicatorful vaginally as needed (burning).    Historical Provider, MD  Cyanocobalamin (B-12 COMPLIANCE INJECTION IJ) Inject as directed every 30 (thirty) days.    Historical Provider, MD  furosemide (LASIX) 40 MG tablet Take 1 tablet (40 mg total) by mouth daily. 07/22/14   Maryann Mikhail, DO  glimepiride (AMARYL) 1 MG tablet Take 1 mg by mouth every morning.    Historical Provider, MD  lisinopril (PRINIVIL,ZESTRIL) 20 MG tablet Take 1 tablet (20 mg total) by mouth every evening. 07/22/14   Maryann Mikhail, DO  lithium carbonate (LITHOBID) 300 MG CR tablet Take 150 mg by mouth every morning.    Historical Provider, MD  metoprolol (LOPRESSOR) 50 MG tablet Take 1 tablet (50 mg total) by mouth 2 (two) times daily.  07/22/14   Maryann Mikhail, DO  mirtazapine (REMERON) 30 MG tablet Take 30 mg by mouth every evening.    Historical Provider, MD  Mouthwashes (BL ANTISEPTIC MOUTH [place]) 5 mLs every 5 (five) hours as needed. GARGLE AND SWALLOW 05/13/14   Historical Provider, MD  nitroGLYCERIN (NITROSTAT) 0.4 MG SL tablet Place 1 tablet (0.4 mg total) under the tongue every 5 (five) minutes as needed for chest pain. 07/22/14   Maryann Mikhail, DO  omeprazole (PRILOSEC) 40 MG capsule Take 40 mg by mouth every evening.     Historical Provider, MD  oxybutynin (DITROPAN) 5 MG tablet Take  2.5 mg by mouth 2 (two) times daily.     Historical Provider, MD  pregabalin (LYRICA) 50 MG capsule Take 50 mg by mouth 2 (two) times daily.     Historical Provider, MD  pregabalin (LYRICA) 75 MG capsule Take 75 mg by mouth 2 (two) times daily.    Historical Provider, MD  risperiDONE (RISPERDAL) 1 MG tablet Take 0.5 mg by mouth every evening.     Historical Provider, MD  traMADol (ULTRAM) 50 MG tablet Take 1 tablet (50 mg total) by mouth 2 (two) times daily as needed (pain). 07/22/14   Maryann Mikhail, DO  Vilazodone HCl 20 MG TABS Take 20 mg by mouth every morning.    Historical Provider, MD  zolpidem (AMBIEN) 5 MG tablet Take 5 mg by mouth at bedtime.     Historical Provider, MD   BP 173/55 mmHg  Pulse 64  Temp(Src) 98.4 F (36.9 C) (Oral)  Resp 16  SpO2 93% Physical Exam  Constitutional: She is oriented to person, place, and time. She appears well-developed and well-nourished. No distress.  HENT:  Head: Normocephalic and atraumatic.  Neck: Normal range of motion. Neck supple.  Cardiovascular: Normal rate and regular rhythm.  Exam reveals no gallop and no friction rub.   No murmur heard. Pulmonary/Chest: Effort normal and breath sounds normal. No respiratory distress. She has no wheezes.  Abdominal: Soft. Bowel sounds are normal. She exhibits no distension. There is no tenderness.  Musculoskeletal: Normal range of motion.  Neurological: She is alert and oriented to person, place, and time.  Speech is slurred and difficult to comprehend. She replies in only short sentences.  There is a left facial droop noted.  She does follow commands and move all extremities. Strength is decreased throughout, however somewhat more pronounced in the left arm.  Skin: Skin is warm and dry. She is not diaphoretic.  Nursing note and vitals reviewed.   ED Course  Procedures (including critical care time) Labs Review Labs Reviewed  CBG MONITORING, ED - Abnormal; Notable for the following:     Glucose-Capillary 151 (*)    All other components within normal limits  CBC  DIFFERENTIAL  PROTIME-INR  APTT  COMPREHENSIVE METABOLIC PANEL  I-STAT TROPOININ, ED  I-STAT CHEM 8, ED    Imaging Review No results found. I have personally reviewed and evaluated these images and lab results as part of my medical decision-making.   EKG Interpretation   Date/Time:  Thursday November 18 2014 14:48:55 EDT Ventricular Rate:  64 PR Interval:  162 QRS Duration: 86 QT Interval:  418 QTC Calculation: 431 R Axis:   -14 Text Interpretation:  Normal sinus rhythm Moderate voltage criteria for  LVH, may be normal variant Nonspecific ST abnormality Abnormal ECG  Confirmed by Tabathia Knoche  MD, Yalitza Teed ([id number]) on 11/18/2014 3:26:01 PM      MDM   Final  diagnoses:  None    Patient brought for evaluation of altered mental status. Her workup reveals no obvious cause including laboratory studies, urinalysis, and head CT. Due to her persistent facial droop and garbled speech, she will undergo a MRI of the brain to rule out the possibility of CVA. Patient will be admitted to hospitalist service under the care of Dr. Verlon Au.    Veryl Speak, MD 11/19/14 [phone]

## 2014-11-18 NOTE — ED Notes (Signed)
Patient transported to MRI 

## 2014-11-18 NOTE — Progress Notes (Signed)
Patient arrived to 5M18. Patient is alert x2, speech very slurred, patient with appropriate response, per ED RN, patient passed stroke swallow screen, placed on diet per order. Q2 vitals started at 1900, neuro assessment unchanged from ED report. Patient oriented to room, unit, staff. Bed alarm on. Report passed to night RN.

## 2014-11-18 NOTE — H&P (Signed)
[facility] History and Physical  TIKESHA MORT PPJ:[id number] DOB: 1930/02/07 DOA: 11/18/2014  Referring physician: Jaquita Rector  PCP: Irven Shelling, MD  Specialists: Neuro  81 y/o ? Admit 5/15/ Acute exac Diast CHF Neg Cardiac cath 06/2003 Known Poorly ctrll'd Htn Ty 2 DM on Insulin CKD III Chr LBP Bipolar with suicidal attempt in early 2011 Gerd  The patient was seen recently in the emergency room 11/12/14 and has had audible falls over the past month with no change in medications The patient was last known normal last p.m. after talking with her daughter on the telephone as a usually do this every evening Patient had a clear voice and is typically coherent and is able to have a perfectly normal conversation Daughter was called this afternoon at around 1:30 by by patient's husband, her stepfather and he could not understand what she was saying. This had happened since the patient apparently had woken up this morning She went over to assess the patient and noted that she had drooping of the right side of the mouth and hence brought her to the emergency room and she was concerned  At baseline the patient does nothing and is total care gets a home health aide at home. Her husband helps her manage dressing feeds her.  She has had about 5 falls in the past month been no recent changes in medications  Patient is not able to give me a clear history and defers to her daughter  I'm able to discern from her that she has no headache, no chills, no fever, no cough, no dysuria, no rash, no blurred vision or double vision that is new  Daughter relates that the symptoms have happened some times before when she has been on medication and there are medication effect she's never had a stroke before  I cannot determine any other specific review of systems  Emergency room workup was significant for BUN of 27/creatinine 1.1-her baseline is 13 and 0.9 and GFR is around 40 Point-of-care troponin was  negative ALT was 172 Hemoglobin 15.6 INR : 1.06 EKG sinus rhythm PR interval 0.12 otherwise normal  CT head did not reveal any specific findings other than old microvascular ischemia  On exam     Past Medical History  Diagnosis Date  . Diabetes mellitus without complication   . Hypertension   . CKD (chronic kidney disease), stage III   . PVC (premature ventricular contraction)   . Hyperlipidemia   . GERD (gastroesophageal reflux disease)   . Osteopenia   . Chronic low back pain   . Postlaminectomy syndrome   . Right carpal tunnel syndrome   . Pernicious anemia   . Chronic interstitial cystitis   . Microhematuria   . Macular degeneration   . Chest pain     a. 07/2014 Neg MV, nl EF.   History reviewed. No pertinent past surgical history. Social History:  Social History   Social History Narrative    Allergies  Allergen Reactions  . Codeine Other (See Comments)    slurred speech    Family History  Problem Relation Age of Onset  . Arthritis Mother   . Heart attack Father     Prior to Admission medications   Medication Sig Start Date End Date Taking? Authorizing Provider  acetaminophen (TYLENOL) 500 MG tablet Take 500 mg by mouth every 6 (six) hours as needed for mild pain or moderate pain.    Yes Historical Provider, MD  atorvastatin (LIPITOR) 10 MG tablet Take  10 mg by mouth every morning.    Yes Historical Provider, MD  cephALEXin (KEFLEX) 500 MG capsule Take 1 capsule (500 mg total) by mouth 2 (two) times daily. 11/12/14  Yes Merryl Hacker, MD  cholecalciferol (VITAMIN D) 1000 UNITS tablet Take 2,000 Units by mouth every morning.   Yes Historical Provider, MD  conjugated estrogens (PREMARIN) vaginal cream Place 1 Applicatorful vaginally as needed (burning).   Yes Historical Provider, MD  Cyanocobalamin (B-12 COMPLIANCE INJECTION IJ) Inject as directed every 30 (thirty) days.   Yes Historical Provider, MD  furosemide (LASIX) 40 MG tablet Take 1 tablet (40 mg  total) by mouth daily. 07/22/14  Yes Maryann Mikhail, DO  glimepiride (AMARYL) 1 MG tablet Take 1 mg by mouth every morning.   Yes Historical Provider, MD  lisinopril (PRINIVIL,ZESTRIL) 20 MG tablet Take 1 tablet (20 mg total) by mouth every evening. 07/22/14  Yes Maryann Mikhail, DO  lithium carbonate (LITHOBID) 300 MG CR tablet Take 150 mg by mouth every morning.   Yes Historical Provider, MD  metoprolol (LOPRESSOR) 50 MG tablet Take 1 tablet (50 mg total) by mouth 2 (two) times daily. 07/22/14  Yes Maryann Mikhail, DO  mirtazapine (REMERON) 30 MG tablet Take 30 mg by mouth every evening.   Yes Historical Provider, MD  nitroGLYCERIN (NITROSTAT) 0.4 MG SL tablet Place 1 tablet (0.4 mg total) under the tongue every 5 (five) minutes as needed for chest pain. 07/22/14  Yes Maryann Mikhail, DO  omeprazole (PRILOSEC) 40 MG capsule Take 40 mg by mouth every evening.    Yes Historical Provider, MD  oxybutynin (DITROPAN) 5 MG tablet Take 2.5 mg by mouth 2 (two) times daily.    Yes Historical Provider, MD  pregabalin (LYRICA) 75 MG capsule Take 75 mg by mouth 2 (two) times daily.   Yes Historical Provider, MD  risperiDONE (RISPERDAL) 1 MG tablet Take 0.5 mg by mouth every evening.    Yes Historical Provider, MD  traMADol (ULTRAM) 50 MG tablet Take 1 tablet (50 mg total) by mouth 2 (two) times daily as needed (pain). Patient taking differently: Take 50 mg by mouth 2 (two) times daily.  07/22/14  Yes Maryann Mikhail, DO  Vilazodone HCl 20 MG TABS Take 20 mg by mouth every morning.   Yes Historical Provider, MD  zolpidem (AMBIEN) 5 MG tablet Take 5 mg by mouth at bedtime.    Yes Historical Provider, MD  alum & mag hydroxide-simeth (MAALOX/MYLANTA) 200-200-20 MG/5ML suspension Take 30 mLs by mouth every 4 (four) hours as needed for indigestion or heartburn. Patient not taking: Reported on 11/18/2014 07/22/14   Cristal Ford, DO   Physical Exam: Filed Vitals:   11/18/14 1600 11/18/14 1615 11/18/14 1630 11/18/14  1700  BP: 157/51  162/45 176/45  Pulse:  61 61 67  Temp:      TempSrc:      Resp:  20 17 13   SpO2:  98% 96% 92%    Obese Flat affect, no nyst No jvd no disc bruit s1 s 2rrr, no m Chest clear abd obese nt nd Power 5/5 but overall weak-can move hip flex/extensors, SHoudler can raise Finger-nose-finger verys low  No dysidadokineasia Visible facial droop DP felt Reflexes equivocal no LE edema  Labs on Admission:  Basic Metabolic Panel:  Recent Labs Lab 11/12/14 0402 11/18/14 1505 11/18/14 1536  NA 143 139 140  K 3.3* 4.6 4.4  CL 105 101 100*  CO2 27 30  --   GLUCOSE 178* 152*  151*  BUN 13 22* 27*  CREATININE 0.91 1.11* 1.10*  CALCIUM 9.5 10.0  --    Liver Function Tests:  Recent Labs Lab 11/18/14 1505  AST 40  ALT 172*  ALKPHOS 114  BILITOT 0.3  PROT 6.9  ALBUMIN 3.8   No results for input(s): LIPASE, AMYLASE in the last 168 hours. No results for input(s): AMMONIA in the last 168 hours. CBC:  Recent Labs Lab 11/12/14 0402 11/18/14 1505 11/18/14 1536  WBC 7.3 7.8  --   NEUTROABS 3.8 4.0  --   HGB 12.7 14.0 15.6*  HCT 39.2 43.7 46.0  MCV 89.9 91.8  --   PLT 172 238  --    Cardiac Enzymes: No results for input(s): CKTOTAL, CKMB, CKMBINDEX, TROPONINI in the last 168 hours.  BNP (last 3 results)  Recent Labs  07/18/14 1216  BNP 250.6*    ProBNP (last 3 results) No results for input(s): PROBNP in the last 8760 hours.  CBG:  Recent Labs Lab 11/18/14 1500  GLUCAP 151*    Radiological Exams on Admission: Ct Head Wo Contrast  11/18/2014   CLINICAL DATA:  Generalized weakness, aphasia  EXAM: CT HEAD WITHOUT CONTRAST  TECHNIQUE: Contiguous axial images were obtained from the base of the skull through the vertex without intravenous contrast.  COMPARISON:  07/21/2009  FINDINGS: No skull fracture is noted. Paranasal sinuses and mastoid air cells are unremarkable. No intracranial hemorrhage, mass effect or midline shift. There is mild cerebral  atrophy. Atherosclerotic calcifications of carotid siphon. Periventricular and patchy subcortical white matter decreased attenuation probable due to chronic small vessel ischemic changes. No acute cortical infarction. No mass lesion is noted on this unenhanced scan.  IMPRESSION: No acute intracranial abnormality. Mild cerebral atrophy. Periventricular and patchy subcortical white matter decreased attenuation probable due to chronic small vessel ischemic changes. No definite acute cortical infarction.   Electronically Signed   By: Lahoma Crocker M.D.   On: 11/18/2014 15:41     Assessment/Plan Active Problems:   Mental status change   TIA (transient ischemic attack)  CVA -We will obtain workup as per stroke pathway with echo, carotids, MRI and consult neurology if MRI is positive -We will get hyperlipidemia panel as well as HbA1c to determine blood sugar control -We will initiate aspirin 325 mg -If she passes stroke swallow screen she can have a carb modified heart healthy diet -Further workup pending  Bipolar -Multiple medications will be continued including Risperdal 0.5, Remeron 30 daily, Vilazodone 20 qam - pharmacy should dose lithium 150 mg as I cannot order this in the system  -Some evidence that Risperdal anti-sychotic medications can cause harm and we will continue this for agitation for now but did note that this may need to be tapered  Hypertension  -continue metoprolol 50 mg twice a day -Hold lisinopril given renal insufficiency  Diabetes mellitus type 2 with consultation neuropathy -HbA1c ordered as part of workup  -hold Amaryl 1 mg every morning -Blood sugars >200 we'll aggressively control with sliding scale coverage  -Continue her 75 twice a day  CTD stage 3 -No acute component as her GFR fluctuates during 35 and 50 -I have held her lisinopril in an effort to see if there is an improvement in the morning  Morbid obesity, There is no weight on file to calculate  BMI. -We'll need to consider management strategies for this as an outpatient she is on multiple anti-psychotic agents and lithium which are known to promote weight gain  Hyperlipidemia -Lipid panel ordered as part of workup  Chronic pain -Patient should continue tramadol 50 carefully given multiple interactions with multiple medication clear no  Elevated ALT -On multiple psychiatric medications which may have an effect on the liver specifically lithium and Vilazodone -Patient may benefit from outpatient ultrasound abdomen pelvis 6 is not been done before -We will repeat LFTs I would hold her atorvastatin for now but may need to restart it on discharge if liver functions normal   Reflux Continue pantoprazole as substitute for her Prilosec  Menopause -Patient is on Premarin cream which has still risk for systemic thrombosis   DO NOT RESUSCITATE confirmed with family at bedside Admit to inpatient 2 telemetry Lovenox Protonix Anticipate 1-2 days admission length of stay  Verneita Griffes, MD [facility] (P) [phone]

## 2014-11-18 NOTE — ED Notes (Signed)
Patient transported to CT 

## 2014-11-18 NOTE — ED Notes (Signed)
Pt arrives via POV from home with daughter who states patient woke up this AM with aphasia and generalized weakness. LSN last night prior to bed.  Pt normally able to converse, today with unintelligable speech. Pt alert, resting in wheelchair, NAD at present.

## 2014-11-18 NOTE — ED Notes (Signed)
Pt placed in bed, MD at bedside, ok to transport to CT per MD Delo.  Family at bedside

## 2014-11-19 ENCOUNTER — Inpatient Hospital Stay (HOSPITAL_COMMUNITY): Payer: Medicare Other

## 2014-11-19 DIAGNOSIS — N390 Urinary tract infection, site not specified: Secondary | ICD-10-CM | POA: Diagnosis present

## 2014-11-19 DIAGNOSIS — I6789 Other cerebrovascular disease: Secondary | ICD-10-CM

## 2014-11-19 DIAGNOSIS — I5032 Chronic diastolic (congestive) heart failure: Secondary | ICD-10-CM

## 2014-11-19 DIAGNOSIS — G9340 Encephalopathy, unspecified: Secondary | ICD-10-CM

## 2014-11-19 DIAGNOSIS — E1165 Type 2 diabetes mellitus with hyperglycemia: Secondary | ICD-10-CM

## 2014-11-19 DIAGNOSIS — G459 Transient cerebral ischemic attack, unspecified: Secondary | ICD-10-CM

## 2014-11-19 LAB — BASIC METABOLIC PANEL
Anion gap: 8 (ref 5–15)
BUN: 20 mg/dL (ref 6–20)
CALCIUM: 9.6 mg/dL (ref 8.9–10.3)
CO2: 28 mmol/L (ref 22–32)
CREATININE: 1.09 mg/dL — AB (ref 0.44–1.00)
Chloride: 102 mmol/L (ref 101–111)
GFR calc Af Amer: 53 mL/min — ABNORMAL LOW (ref >60)
GFR, EST NON AFRICAN AMERICAN: 46 mL/min — AB (ref >60)
Glucose, Bld: 255 mg/dL — ABNORMAL HIGH (ref 65–99)
Potassium: 4.4 mmol/L (ref 3.5–5.1)
SODIUM: 138 mmol/L (ref 135–145)

## 2014-11-19 LAB — CBC
HCT: 40.7 % (ref 36.0–46.0)
Hemoglobin: 12.8 g/dL (ref 12.0–15.0)
MCH: 29 pg (ref 26.0–34.0)
MCHC: 31.4 g/dL (ref 30.0–36.0)
MCV: 92.1 fL (ref 78.0–100.0)
PLATELETS: 211 10*3/uL (ref 150–400)
RBC: 4.42 MIL/uL (ref 3.87–5.11)
RDW: 13.2 % (ref 11.5–15.5)
WBC: 5.9 10*3/uL (ref 4.0–10.5)

## 2014-11-19 LAB — GLUCOSE, CAPILLARY
GLUCOSE-CAPILLARY: 207 mg/dL — AB (ref 65–99)
GLUCOSE-CAPILLARY: 141 mg/dL — AB (ref 65–99)
GLUCOSE-CAPILLARY: 163 mg/dL — AB (ref 65–99)

## 2014-11-19 LAB — LIPID PANEL
CHOL/HDL RATIO: 5.9 ratio
Cholesterol: 196 mg/dL (ref 0–200)
HDL: 33 mg/dL — AB (ref >40)
LDL CALC: 85 mg/dL (ref 0–99)
Triglycerides: 390 mg/dL — ABNORMAL HIGH (ref <150)
VLDL: 78 mg/dL — AB (ref 0–40)

## 2014-11-19 MED ORDER — HYDRALAZINE HCL 20 MG/ML IJ SOLN: 10.0000 mg | Freq: Four times a day (QID) | INTRAMUSCULAR | Status: DC | PRN

## 2014-11-19 MED ORDER — LISINOPRIL 20 MG PO TABS
20.0000 mg | ORAL_TABLET | Freq: Every day | ORAL | Status: DC
  Administered 2014-11-19 – 2014-11-20 (×2): 20 mg via ORAL
  Filled 2014-11-19 (×2): qty 1

## 2014-11-19 MED ORDER — INSULIN ASPART 100 UNIT/ML ~~LOC~~ SOLN
0.0000 [IU] | Freq: Three times a day (TID) | SUBCUTANEOUS | Status: DC
  Administered 2014-11-19: 3 [IU] via SUBCUTANEOUS
  Administered 2014-11-19: 1 [IU] via SUBCUTANEOUS
  Administered 2014-11-20: 2 [IU] via SUBCUTANEOUS

## 2014-11-19 MED ORDER — INSULIN ASPART 100 UNIT/ML ~~LOC~~ SOLN: 0.0000 [IU] | Freq: Every day | SUBCUTANEOUS | Status: DC

## 2014-11-19 MED ORDER — ENOXAPARIN SODIUM 40 MG/0.4ML ~~LOC~~ SOLN: 40.0000 mg | SUBCUTANEOUS | Status: DC

## 2014-11-19 MED ORDER — ASPIRIN EC 325 MG PO TBEC
325.0000 mg | DELAYED_RELEASE_TABLET | Freq: Every day | ORAL | Status: DC
  Administered 2014-11-19 – 2014-11-21 (×3): 325 mg via ORAL
  Filled 2014-11-19 (×3): qty 1

## 2014-11-19 NOTE — Evaluation (Signed)
Physical Therapy Evaluation Patient Details Name: [name] MRN: [id number] DOB: Nov 23, 1929 Today's Date: 11/19/2014   History of Present Illness  Patient is a 81 year old female with diabetes mellitus, hypertension, chronic in disease, stage III, hyperlipidemia, GERD, recurrent falls presented to ED with acute encephalopathy, right facial drooping, dysarthria.  MRI negative for stroke.  Clinical Impression  Patient presents with decreased mobility due to deficits listed below.  Not certain of baseline, but still feel she can benefit from skilled PT in the acute setting to allow return home with family assist and follow up HHPT.    Follow Up Recommendations Home health PT;Supervision/Assistance - 24 hour    Equipment Recommendations  None recommended by PT    Recommendations for Other Services       Precautions / Restrictions Precautions Precautions: Fall Precaution Comments: h/o falls at home      Mobility  Bed Mobility Overal bed mobility: Needs Assistance Bed Mobility: Rolling;Sidelying to Sit;Sit to Supine Rolling: Min assist Sidelying to sit: Mod assist;HOB elevated   Sit to supine: Mod assist   General bed mobility comments: cues for technique to use rail to push up, assisted legs off bed and trunk upright, but pt helping too; to supine assisted with legs, pt able to scoot up with min assist bed in trendelenberg and mod cues with rails  Transfers Overall transfer level: Needs assistance Equipment used: Rolling walker (2 wheeled) Transfers: Stand Pivot Transfers   Stand pivot transfers: Mod assist       General transfer comment: pivot to [facility] without walker hand on bed rail and arm of BSC, cues for stepping and mod lifting assist; to bed, stood with RW and increased time and cues to take steps to bed mod assist for lifting and balance  Ambulation/Gait             General Gait Details: unable at this time  Stairs            Wheelchair Mobility     Modified Rankin (Stroke Patients Only)       Balance Overall balance assessment: Needs assistance Sitting-balance support: Feet supported;Bilateral upper extremity supported Sitting balance-Leahy Scale: Poor Sitting balance - Comments: min assist initially due to leaning to left, then close supervision   Standing balance support: Bilateral upper extremity supported Standing balance-Leahy Scale: Poor Standing balance comment: still leans left and need help with UE support for balance                             Pertinent Vitals/Pain Pain Assessment: 0-10 Pain Score: 10-Worst pain ever Pain Location: back pain (chronic) Pain Descriptors / Indicators: Aching Pain Intervention(s): Monitored during session;Repositioned    Home Living Family/patient expects to be discharged to:: Private residence Living Arrangements: Spouse/significant other Available Help at Discharge: Available 24 hours/day;Family   Home Access: Level entry     Home Layout: One level Home Equipment: Environmental consultant - 2 wheels;Shower seat;Bedside commode;Wheelchair - manual      Prior Function Level of Independence: Needs assistance   Gait / Transfers Assistance Needed: reports doesn't walk much, transfers with assist  ADL's / Homemaking Assistance Needed: aide 2x/week for bathing, husband assists with dressing; not able to do housework        Hand Dominance        Extremity/Trunk Assessment               Lower Extremity Assessment: Generalized weakness  Cervical / Trunk Assessment: Other exceptions  Communication      Cognition Arousal/Alertness: Awake/alert   Overall Cognitive Status: No family/caregiver present to determine baseline cognitive functioning                      General Comments      Exercises        Assessment/Plan    PT Assessment Patient needs continued PT services  PT Diagnosis Generalized weakness   PT Problem List Decreased  balance;Decreased mobility;Decreased strength;Decreased knowledge of use of DME  PT Treatment Interventions DME instruction;Balance training;Functional mobility training;Patient/family education;Therapeutic activities;Therapeutic exercise;Wheelchair mobility training;Gait training   PT Goals (Current goals can be found in the Care Plan section) Acute Rehab PT Goals Patient Stated Goal: To go home PT Goal Formulation: With patient Time For Goal Achievement: 12/03/14 Potential to Achieve Goals: Good    Frequency Min 3X/week   Barriers to discharge        Co-evaluation               End of Session Equipment Utilized During Treatment: Gait belt Activity Tolerance: Patient limited by fatigue Patient left: in bed;with call bell/phone within reach Nurse Communication: Mobility status         Time: 1530-1601 PT Time Calculation (min) (ACUTE ONLY): 31 min   Charges:   PT Evaluation $Initial PT Evaluation Tier I: 1 Procedure PT Treatments $Therapeutic Activity: 8-22 mins   PT G Codes:        WYNN,CYNDI 19-Dec-2014, 5:36 PM Magda Kiel, [place] 12/19/14

## 2014-11-19 NOTE — Progress Notes (Signed)
  Echocardiogram 2D Echocardiogram has been performed.  [name] 11/19/2014, 11:34 AM

## 2014-11-19 NOTE — Progress Notes (Signed)
*  PRELIMINARY RESULTS* Vascular Ultrasound Carotid Duplex (Doppler) has been completed.  Preliminary findings: Bilateral: No significant (1-39%) ICA stenosis. Antegrade vertebral flow.    Landry Mellow, RDMS, RVT  11/19/2014, 10:07 AM

## 2014-11-19 NOTE — Evaluation (Signed)
Speech Language Pathology Evaluation Patient Details Name: [name] MRN: [id number] DOB: 11/18/1929 Today's Date: 11/19/2014 Time: 4098-1191 SLP Time Calculation (min) (ACUTE ONLY): 30 min  Problem List:  Patient Active Problem List   Diagnosis Date Noted  . Acute encephalopathy 11/19/2014  . UTI (urinary tract infection) 11/19/2014  . Mental status change 11/18/2014  . TIA (transient ischemic attack) 11/18/2014  . Obesity (BMI 35.0-39.9 without comorbidity) 11/18/2014  . ALT (SGPT) level raised 11/18/2014  . Pain in the chest   . Chronic diastolic heart failure 47/82/9562  . CKD (chronic kidney disease), stage III   . Precordial chest pain 07/18/2014  . Uncontrolled hypertension 07/18/2014  . Type 2 diabetes mellitus with hyperglycemia 07/18/2014  . Hyperlipidemia 07/18/2014   Past Medical History:  Past Medical History  Diagnosis Date  . Diabetes mellitus without complication   . Hypertension   . CKD (chronic kidney disease), stage III   . PVC (premature ventricular contraction)   . Hyperlipidemia   . GERD (gastroesophageal reflux disease)   . Osteopenia   . Chronic low back pain   . Postlaminectomy syndrome   . Right carpal tunnel syndrome   . Pernicious anemia   . Chronic interstitial cystitis   . Microhematuria   . Macular degeneration   . Chest pain     a. 07/2014 Neg MV, nl EF.   Past Surgical History: History reviewed. No pertinent past surgical history. HPI:  The patient was seen recently in the emergency room 11/12/14 and has had audible falls over the past month with no change in medications.Daughter was called this afternoon at around 1:30 by by patient's husband, her stepfather and he could not understand what she was saying. This had happened since the patient apparently had woken up this morning   Assessment / Plan / Recommendation Clinical Impression  Patient presents with a moderate flaccid dysarthria resulting in significant reduction in  intelligibility at phrase level, and very low vocal intensity which exacerbates dysarthria symptoms,  as well as cognitive-linguistic impairments consisting of decreased initiation of responding/expressing herself, memory retrieval deficit, decreased cognitive processing and attention. Per her daughter who was present at the evaluation, patient receives physical care from husband, but that her decreased speech quality, vocal intensity and cognitive delays, etc are different from her baseline.    SLP Assessment  Patient needs continued Speech Lanaguage Pathology Services    Follow Up Recommendations  Home health SLP;24 hour supervision/assistance    Frequency and Duration min 2x/week  2 weeks   Pertinent Vitals/Pain Pain Assessment: No/denies pain   SLP Goals  Patient/Family Stated Goal: none stated Potential to Achieve Goals (ACUTE ONLY): Good  SLP Evaluation Prior Functioning  Cognitive/Linguistic Baseline: Within functional limits Available Help at Discharge: Family;Available 24 hours/day   Cognition  Overall Cognitive Status: Impaired/Different from baseline Arousal/Alertness: Awake/alert Orientation Level: Oriented to person;Oriented to place;Disoriented to time;Disoriented to situation Attention: Sustained;Selective Sustained Attention: Appears intact Selective Attention: Impaired Selective Attention Impairment: Verbal basic Memory: Impaired Memory Impairment: Retrieval deficit Awareness: Appears intact Executive Function: Initiating;Organizing;Self Monitoring Organizing: Impaired Organizing Impairment: Verbal basic Initiating: Impaired Initiating Impairment: Verbal basic Self Monitoring Impairment: Verbal basic Safety/Judgment: Appears intact    Comprehension  Auditory Comprehension Overall Auditory Comprehension: Impaired Yes/No Questions: Within Functional Limits Conversation: Simple Interfering Components: Attention;Processing speed EffectiveTechniques: Extra  processing time;Increased volume;Slowed speech    Expression Expression Primary Mode of Expression: Verbal Verbal Expression Overall Verbal Expression: Impaired Initiation: Impaired Level of Generative/Spontaneous Verbalization: Word Naming: No impairment Pragmatics:  No impairment Effective Techniques: Open ended questions;Semantic cues Non-Verbal Means of Communication: Not applicable   Oral / Motor Oral Motor/Sensory Function Overall Oral Motor/Sensory Function: Impaired Labial ROM: Reduced right Labial Symmetry: Abnormal symmetry right Labial Strength: Reduced Lingual ROM: Within Functional Limits Lingual Symmetry: Within Functional Limits Lingual Strength: Reduced Facial ROM: Reduced right Facial Symmetry: Right droop Facial Strength: Reduced Motor Speech Overall Motor Speech: Impaired Phonation: Low vocal intensity Resonance: Within functional limits Articulation: Impaired Level of Impairment: Phrase Intelligibility: Intelligibility reduced Word: 50-74% accurate Phrase: 25-49% accurate Sentence: 0-24% accurate Conversation: 0-24% accurate Motor Planning: Witnin functional limits Motor Speech Errors: Not applicable   GO     Dannial Monarch 11/19/2014, 4:54 PM   Sonia Baller, MA, CCC-SLP 11/19/2014 4:54 PM

## 2014-11-19 NOTE — Progress Notes (Signed)
[facility]                                                                              Patient Demographics  [name], is a 81 y.o. female, DOB - Oct 04, 1929, JSH:[id number]  Admit date - 11/18/2014   Admitting Physician Jane Sells, MD  Outpatient Primary MD for the patient is Jane Shelling, MD  LOS - 1   Chief Complaint  Patient presents with  . Stroke Symptoms       Brief HPI   Patient is a 81 year old female with diabetes mellitus, hypertension, chronic in disease, stage III, hyperlipidemia, GERD, recurrent falls presented to ED with acute encephalopathy, right facial drooping, dysarthria. Patient presented from home, lives with her husband. Per the daughter, who reported patient woke up on the morning of admission with aphasia and generalized weakness. She was last seen normal on night before the admission. Normally she is able to converse. Patient's daughter also noticed drooping of the right side of the mouth. At baseline, patient is a total care and has a home health aid at home. Patient has had 5 falls in the past month. No prior history of stroke. The patient was admitted for further workup.    Assessment & Plan    Principal Problem:   Acute encephalopathy, with facial drooping likely due to TIA (transient ischemic attack) versus hypertensive encephalopathy, UTI - MRI of the brain showed no acute stroke, no intracranial mass lesion or significant finding to explain patient's speech difficulty and generalized weakness. - MR angiogram showed no proximal intracranial stenosis or occlusion, intracranial atherosclerotic disease. - 2-D echo in May 2016 had shown EF of 85-02%, grade 1 diastolic dysfunction -Carotid Dopplers showed no significant stenosis - PTOT evaluation pending -  Continue statin, placed on aspirin 325 mg daily  - Lipid panel showed LDL 85, continue statins  - Obtain UA and urine culture  Active Problems:   Uncontrolled  hypertensionWith likely hypertensive encephalopathy -BP 208/47  , restart metoprolol, lisinopril, Lasix, placed on hydralazine     Type 2 diabetes mellitus with hyperglycemia - Placed on sliding scale insulin, obtain hemoglobin A1c    Chronic diastolic heart failure - Currently stable, euvolemic - 2-D echo in May 2016 had shown EF of 77-41%, grade 1 diastolic     Obesity (BMI 35.0-39.9 without comorbidity) - Patient counseled on diet and weight control, PT evaluation    Code Status: DO NOT RESUSCITATE   Family Communication: Discussed in detail with the patient, all imaging results, lab results explained to the patient   Disposition Plan: Await PT evaluation, await UA and culture   Time Spent in minutes   25 minutes  Procedures  MRI, MRA brain   carotid Dopplers  Consults   None   DVT Prophylaxis  Lovenox  Medications  Scheduled Meds: . enoxaparin (LOVENOX) injection  30 mg Subcutaneous Q24H  . furosemide  40 mg Oral Daily  . lisinopril  20 mg Oral Daily  . lithium carbonate  150 mg Oral Daily  . metoprolol  50 mg Oral BID  . mirtazapine  30 mg Oral QPM  . oxybutynin  2.5  mg Oral BID  . pantoprazole  40 mg Oral Daily  . pregabalin  75 mg Oral BID  . risperiDONE  0.5 mg Oral QPM  . traMADol  50 mg Oral BID  . Vilazodone HCl  20 mg Oral BH-q7a  . zolpidem  5 mg Oral QHS   Continuous Infusions:  PRN Meds:.conjugated estrogens, senna-docusate   Antibiotics   Anti-infectives    None        Subjective:   [name] was seen and examined today. Still somewhat confused, intermittent dysarthria. Patient denies dizziness, chest pain, shortness of breath, abdominal pain, N/V/D/C. Afebrile, no acute events overnight   Objective:   Blood pressure 178/58, pulse 69, temperature 98.4 F (36.9 C), temperature source Oral, resp. rate 20, height 5' (1.524 m), weight 77.293 kg (170 lb 6.4 oz), SpO2 93 %.  Wt Readings from Last 3 Encounters:  11/18/14 77.293 kg (170  lb 6.4 oz)  07/22/14 77.2 kg (170 lb 3.1 oz)    No intake or output data in the 24 hours ending 11/19/14 1112  Exam  General: Alert and oriented x 3, NAD, slow to respond to questions   HEENT:  PERRLA, EOMI, Anicteric Sclera, mucous membranes moist.   Neck: Supple, no JVD, no masses  CVS: S1 S2 auscultated, no rubs, murmurs or gallops. Regular rate and rhythm.  Respiratory: Clear to auscultation bilaterally, no wheezing, rales or rhonchi  Abdomen: Soft, nontender, nondistended, + bowel sounds  Ext: no cyanosis clubbing or edema  Neuro: AAOx3, Cr N's II- XII. Strength 5/5 upper and lower extremities bilaterally, left hip pain   Skin: No rashes  Psych: alert and oriented x3    Data Review   Micro Results Recent Results (from the past 240 hour(s))  Urine culture     Status: None   Collection Time: 11/12/14  4:15 AM  Result Value Ref Range Status   Specimen Description URINE, CLEAN CATCH  Final   Special Requests NONE  Final   Culture MULTIPLE SPECIES PRESENT, SUGGEST RECOLLECTION  Final   Report Status 11/13/2014 FINAL  Final    Radiology Reports Dg Chest 2 View  11/18/2014   CLINICAL DATA:  Shortness of breath.  EXAM: CHEST  2 VIEW  COMPARISON:  07/18/2014  FINDINGS: Exam demonstrates low lung volumes without focal consolidation or effusion. Cardiomediastinal silhouette is within normal. There is calcified plaque over the aortic arch. There are mild degenerative changes of the spine.  IMPRESSION: Hypoinflation without acute cardiopulmonary disease.   Electronically Signed   By: Marin Olp M.D.   On: 11/18/2014 23:31   Dg Lumbar Spine Complete  11/12/2014   CLINICAL DATA:  Left hip pain after a fall.  EXAM: LUMBAR SPINE - COMPLETE 4+ VIEW  COMPARISON:  Lumbar spine 01/06/2008.  MRI lumbar spine 12/15/2007  FINDINGS: Degenerative changes in the lumbar spine with narrowed lumbar interspaces and associated endplate hypertrophic changes. Mild scoliosis convex towards the  left. Prominent endplate sclerosis from L2 through L5. No anterior subluxation. No vertebral compression. Visualized bone cortex appears intact.  IMPRESSION: Degenerative changes in the lumbar spine. No acute displaced fractures identified.   Electronically Signed   By: Lucienne Capers M.D.   On: 11/12/2014 05:12   Ct Head Wo Contrast  11/18/2014   CLINICAL DATA:  Generalized weakness, aphasia  EXAM: CT HEAD WITHOUT CONTRAST  TECHNIQUE: Contiguous axial images were obtained from the base of the skull through the vertex without intravenous contrast.  COMPARISON:  07/21/2009  FINDINGS: No  skull fracture is noted. Paranasal sinuses and mastoid air cells are unremarkable. No intracranial hemorrhage, mass effect or midline shift. There is mild cerebral atrophy. Atherosclerotic calcifications of carotid siphon. Periventricular and patchy subcortical white matter decreased attenuation probable due to chronic small vessel ischemic changes. No acute cortical infarction. No mass lesion is noted on this unenhanced scan.  IMPRESSION: No acute intracranial abnormality. Mild cerebral atrophy. Periventricular and patchy subcortical white matter decreased attenuation probable due to chronic small vessel ischemic changes. No definite acute cortical infarction.   Electronically Signed   By: Lahoma Crocker M.D.   On: 11/18/2014 15:41   Mr Brain Wo Contrast  11/18/2014   CLINICAL DATA:  Patient woke up with aphasia and generalized weakness. Last seen normal last night prior to bed. Today has unintelligible speech. Stroke risk factors include diabetes, hypertension, and chronic renal disease.  EXAM: MRI HEAD WITHOUT CONTRAST  TECHNIQUE: Multiplanar, multiecho pulse sequences of the brain and surrounding structures were obtained without intravenous contrast.  COMPARISON:  CT head earlier today.  MR brain 09/17/2006.  FINDINGS: No evidence for acute infarction, hemorrhage, mass lesion, hydrocephalus, or extra-axial fluid. Generalized  atrophy. Mild subcortical and periventricular T2 and FLAIR hyperintensities, likely chronic microvascular ischemic change.  Pituitary, pineal, and cerebellar tonsils unremarkable. No upper cervical lesions.  Flow voids are maintained throughout the carotid, basilar, and vertebral arteries. There are no areas of chronic hemorrhage. LEFT vertebral dominant.  Visualized calvarium, skull base, and upper cervical osseous structures unremarkable. Scalp and extracranial soft tissues, orbits, sinuses, and mastoids show no acute process. BILATERAL cataract extraction.  Compared to prior CT, good general agreement. Compared with prior MR, progression of atrophy and small vessel disease.  IMPRESSION: No acute stroke is evident. No intracranial mass lesion or other significant finding to explain the patient's speech difficulty and generalized weakness.  Atrophy and small vessel disease similar to prior CT.   Electronically Signed   By: Staci Righter M.D.   On: 11/18/2014 18:17   Mr Jodene Nam Head/brain Wo Cm  11/18/2014   CLINICAL DATA:  Patient woke up with aphasia and generalized weakness. Last seen normal last night prior to bed. Today has unintelligible speech. Stroke risk factors include diabetes, hypertension, and chronic renal disease.  EXAM: MRA HEAD WITHOUT CONTRAST  TECHNIQUE: Angiographic images of the Circle of Willis were obtained using MRA technique without intravenous contrast.  COMPARISON:  MRI brain performed earlier in the day.  FINDINGS: The internal carotid arteries show minor irregularity in their cavernous and supraclinoid segments, without flow reducing stenosis.  Normal-appearing basilar artery with vertebrals codominant. No proximal stenosis of the anterior, middle, or posterior cerebral arteries. Mild irregularity of the distal MCA and PCA branches suggesting intracranial atherosclerotic disease.  No cerebellar branch occlusion.  Duplicated RIGHT SCA branches.  No intracranial aneurysm.  IMPRESSION: No  proximal intracranial stenosis or occlusion.  Mild irregularity of the distal MCA and PCA branches suggesting intracranial atherosclerotic disease.   Electronically Signed   By: Staci Righter M.D.   On: 11/18/2014 19:54   Dg Hip Unilat With Pelvis 1v Left  11/12/2014   CLINICAL DATA:  Patient fell after wandering around. Patient was found on the floor. Hip pain.  EXAM: DG HIP (WITH OR WITHOUT PELVIS) 1V*L*  COMPARISON:  Left hip 04/25/2010  FINDINGS: Postoperative change with previous bipolar left hip arthroplasty using non cemented femoral component. Components appear well seated. No evidence of acute fracture or dislocation in the left hip. Heterotopic ossification around the greater  trochanter. Pelvis appears intact without evidence of acute fracture or dislocation. SI joints and symphysis pubis are not displaced.  IMPRESSION: No acute bony abnormalities. Postoperative changes of left hip arthroplasty with heterotopic ossification around the greater trochanter.   Electronically Signed   By: Lucienne Capers M.D.   On: 11/12/2014 05:10    CBC  Recent Labs Lab 11/18/14 1505 11/18/14 1536 11/19/14 0858  WBC 7.8  --  5.9  HGB 14.0 15.6* 12.8  HCT 43.7 46.0 40.7  PLT 238  --  211  MCV 91.8  --  92.1  MCH 29.4  --  29.0  MCHC 32.0  --  31.4  RDW 13.3  --  13.2  LYMPHSABS 3.0  --   --   MONOABS 0.7  --   --   EOSABS 0.1  --   --   BASOSABS 0.0  --   --     Chemistries   Recent Labs Lab 11/18/14 1505 11/18/14 1536 11/19/14 0858  NA 139 140 138  K 4.6 4.4 4.4  CL 101 100* 102  CO2 30  --  28  GLUCOSE 152* 151* 255*  BUN 22* 27* 20  CREATININE 1.11* 1.10* 1.09*  CALCIUM 10.0  --  9.6  AST 40  --   --   ALT 172*  --   --   ALKPHOS 114  --   --   BILITOT 0.3  --   --    ------------------------------------------------------------------------------------------------------------------ estimated creatinine clearance is 35.9 mL/min (by C-G formula based on Cr of  1.09). ------------------------------------------------------------------------------------------------------------------ No results for input(s): HGBA1C in the last 72 hours. ------------------------------------------------------------------------------------------------------------------  Recent Labs  11/19/14 0602  CHOL 196  HDL 33*  LDLCALC 85  TRIG 390*  CHOLHDL 5.9   ------------------------------------------------------------------------------------------------------------------ No results for input(s): TSH, T4TOTAL, T3FREE, THYROIDAB in the last 72 hours.  Invalid input(s): FREET3 ------------------------------------------------------------------------------------------------------------------ No results for input(s): VITAMINB12, FOLATE, FERRITIN, TIBC, IRON, RETICCTPCT in the last 72 hours.  Coagulation profile  Recent Labs Lab 11/18/14 1505  INR 1.06    No results for input(s): DDIMER in the last 72 hours.  Cardiac Enzymes No results for input(s): CKMB, TROPONINI, MYOGLOBIN in the last 168 hours.  Invalid input(s): CK ------------------------------------------------------------------------------------------------------------------ Invalid input(s): POCBNP   Recent Labs  11/18/14 1500  GLUCAP 151*     RAI,RIPUDEEP M.D. [facility] 11/19/2014, 11:12 AM  Pager: [phone] Between 7am to 7pm - call Pager - [phone]  After 7pm go to [email or web address] - password [email or web address]  Call night coverage person covering after 7pm

## 2014-11-20 LAB — URINALYSIS, ROUTINE W REFLEX MICROSCOPIC
Bilirubin Urine: NEGATIVE
GLUCOSE, UA: NEGATIVE mg/dL
Hgb urine dipstick: NEGATIVE
Ketones, ur: NEGATIVE mg/dL
LEUKOCYTES UA: NEGATIVE
Nitrite: NEGATIVE
PH: 5 (ref 5.0–8.0)
Protein, ur: NEGATIVE mg/dL
SPECIFIC GRAVITY, URINE: 1.01 (ref 1.005–1.030)
Urobilinogen, UA: 0.2 mg/dL (ref 0.0–1.0)

## 2014-11-20 LAB — BASIC METABOLIC PANEL
Anion gap: 10 (ref 5–15)
BUN: 28 mg/dL — AB (ref 6–20)
CHLORIDE: 102 mmol/L (ref 101–111)
CO2: 29 mmol/L (ref 22–32)
CREATININE: 1.55 mg/dL — AB (ref 0.44–1.00)
Calcium: 9.7 mg/dL (ref 8.9–10.3)
GFR calc Af Amer: 35 mL/min — ABNORMAL LOW (ref >60)
GFR calc non Af Amer: 30 mL/min — ABNORMAL LOW (ref >60)
Glucose, Bld: 173 mg/dL — ABNORMAL HIGH (ref 65–99)
POTASSIUM: 4.4 mmol/L (ref 3.5–5.1)
Sodium: 141 mmol/L (ref 135–145)

## 2014-11-20 LAB — GLUCOSE, CAPILLARY
GLUCOSE-CAPILLARY: 176 mg/dL — AB (ref 65–99)
Glucose-Capillary: 181 mg/dL — ABNORMAL HIGH (ref 65–99)
Glucose-Capillary: 149 mg/dL — ABNORMAL HIGH (ref 65–99)
Glucose-Capillary: 153 mg/dL — ABNORMAL HIGH (ref 65–99)

## 2014-11-20 LAB — CBC
HEMATOCRIT: 41.3 % (ref 36.0–46.0)
HEMOGLOBIN: 13 g/dL (ref 12.0–15.0)
MCH: 29.2 pg (ref 26.0–34.0)
MCHC: 31.5 g/dL (ref 30.0–36.0)
MCV: 92.8 fL (ref 78.0–100.0)
Platelets: 209 10*3/uL (ref 150–400)
RBC: 4.45 MIL/uL (ref 3.87–5.11)
RDW: 13.2 % (ref 11.5–15.5)
WBC: 5.4 10*3/uL (ref 4.0–10.5)

## 2014-11-20 LAB — HEMOGLOBIN A1C
Hgb A1c MFr Bld: 7.7 % — ABNORMAL HIGH (ref 4.8–5.6)
Mean Plasma Glucose: 174 mg/dL

## 2014-11-20 MED ORDER — ENOXAPARIN SODIUM 30 MG/0.3ML ~~LOC~~ SOLN
30.0000 mg | SUBCUTANEOUS | Status: DC
  Administered 2014-11-20: 30 mg via SUBCUTANEOUS
  Filled 2014-11-20: qty 0.3

## 2014-11-20 MED ORDER — INSULIN ASPART 100 UNIT/ML ~~LOC~~ SOLN
0.0000 [IU] | Freq: Three times a day (TID) | SUBCUTANEOUS | Status: DC
  Administered 2014-11-20: 3 [IU] via SUBCUTANEOUS
  Administered 2014-11-20: 2 [IU] via SUBCUTANEOUS
  Administered 2014-11-21: 8 [IU] via SUBCUTANEOUS
  Administered 2014-11-21: 2 [IU] via SUBCUTANEOUS

## 2014-11-20 MED ORDER — SODIUM CHLORIDE 0.9 % IV SOLN
INTRAVENOUS | Status: DC
  Administered 2014-11-20: 14:00:00 via INTRAVENOUS

## 2014-11-20 NOTE — Progress Notes (Signed)
[facility]                                                                              Patient Demographics  [name], is a 81 y.o. female, DOB - [birth year], QIH:[id number]  Admit date - 11/18/2014   Admitting Physician Nita Sells, MD  Outpatient Primary MD for the patient is Irven Shelling, MD  LOS - 2   Chief Complaint  Patient presents with  . Stroke Symptoms       Brief HPI   Patient is a 81 year old female with diabetes mellitus, hypertension, chronic in disease, stage III, hyperlipidemia, GERD, recurrent falls presented to ED with acute encephalopathy, right facial drooping, dysarthria. Patient presented from home, lives with her husband. Per the daughter, who reported patient woke up on the morning of admission with aphasia and generalized weakness. She was last seen normal on night before the admission. Normally she is able to converse. Patient's daughter also noticed drooping of the right side of the mouth. At baseline, patient is a total care and has a home health aid at home. Patient has had 5 falls in the past month. No prior history of stroke. The patient was admitted for further workup.    Assessment & Plan    Principal Problem:   Acute encephalopathy, with facial drooping likely due to TIA (transient ischemic attack) versus hypertensive encephalopathy, UTI - MRI of the brain showed no acute stroke, no intracranial mass lesion or significant finding to explain patient's speech difficulty and generalized weakness. - MR angiogram showed no proximal intracranial stenosis or occlusion, intracranial atherosclerotic disease. - 2-D echo in May 2016 had shown EF of 87-56%, grade 1 diastolic dysfunction -Carotid Dopplers showed no significant stenosis - PTOT evaluation recommended home health PT -  Continue statin, placed on aspirin 325 mg daily  - Lipid panel showed LDL 85, continue statins  - Obtain UA and urine culture-> still  pending  Active Problems: Acute kidney injury - Creatinine trended up to 1.5 today - Hold lisinopril, Lasix, placed on gentle hydration    Uncontrolled hypertensionWith likely hypertensive encephalopathy -BP improved, holding Lasix and lisinopril today     Type 2 diabetes mellitus with hyperglycemia - Hemoglobin A1c 7.7, blood sugar is somewhat uncontrolled - Increase sliding scale to moderate    Chronic diastolic heart failure - Currently stable, euvolemic - 2-D echo in May 2016 had shown EF of 43-32%, grade 1 diastolic     Obesity (BMI 35.0-39.9 without comorbidity) - Patient counseled on diet and weight control, PT evaluation    Code Status: DO NOT RESUSCITATE   Family Communication: Discussed in detail with the patient, all imaging results, lab results explained to the patient   Disposition Plan: Await PT evaluation, await UA and culture   Time Spent in minutes   25 minutes  Procedures  MRI, MRA brain   carotid Dopplers  Consults   None   DVT Prophylaxis  Lovenox  Medications  Scheduled Meds: . aspirin EC  325 mg Oral Daily  . enoxaparin (LOVENOX) injection  40 mg Subcutaneous Q24H  . furosemide  40 mg Oral Daily  . insulin  aspart  0-5 Units Subcutaneous QHS  . insulin aspart  0-9 Units Subcutaneous TID WC  . lisinopril  20 mg Oral Daily  . lithium carbonate  150 mg Oral Daily  . metoprolol  50 mg Oral BID  . mirtazapine  30 mg Oral QPM  . oxybutynin  2.5 mg Oral BID  . pantoprazole  40 mg Oral Daily  . pregabalin  75 mg Oral BID  . risperiDONE  0.5 mg Oral QPM  . traMADol  50 mg Oral BID  . Vilazodone HCl  20 mg Oral BH-q7a  . zolpidem  5 mg Oral QHS   Continuous Infusions:  PRN Meds:.conjugated estrogens, hydrALAZINE, senna-docusate   Antibiotics   Anti-infectives    None        Subjective:   [name] was seen and examined today. Patient denies dizziness, chest pain, shortness of breath, abdominal pain, N/V/D/C. Afebrile, no acute  events overnight   Objective:   Blood pressure 131/37, pulse 64, temperature 98.4 F (36.9 C), temperature source Oral, resp. rate 18, height 5' (1.524 m), weight 77.293 kg (170 lb 6.4 oz), SpO2 92 %.  Wt Readings from Last 3 Encounters:  11/18/14 77.293 kg (170 lb 6.4 oz)  07/22/14 77.2 kg (170 lb 3.1 oz)    No intake or output data in the 24 hours ending 11/20/14 1058  Exam  General: Alert and oriented x 3, NAD, slow to respond to questions   HEENT:  PERRLA, EOMI, Anicteric Sclera, mucous membranes moist.   Neck: Supple, no JVD, no masses  CVS: S1 S2clear RRR  Respiratory: CTAB  Abdomen: Soft, nontender, nondistended, + bowel sounds  Ext: no cyanosis clubbing or edema  Neuro: No new deficits   Skin: No rashes  Psych: alert and oriented x3    Data Review   Micro Results Recent Results (from the past 240 hour(s))  Urine culture     Status: None   Collection Time: 11/12/14  4:15 AM  Result Value Ref Range Status   Specimen Description URINE, CLEAN CATCH  Final   Special Requests NONE  Final   Culture MULTIPLE SPECIES PRESENT, SUGGEST RECOLLECTION  Final   Report Status 11/13/2014 FINAL  Final    Radiology Reports Dg Chest 2 View  11/18/2014   CLINICAL DATA:  Shortness of breath.  EXAM: CHEST  2 VIEW  COMPARISON:  07/18/2014  FINDINGS: Exam demonstrates low lung volumes without focal consolidation or effusion. Cardiomediastinal silhouette is within normal. There is calcified plaque over the aortic arch. There are mild degenerative changes of the spine.  IMPRESSION: Hypoinflation without acute cardiopulmonary disease.   Electronically Signed   By: Marin Olp M.D.   On: 11/18/2014 23:31   Dg Lumbar Spine Complete  11/12/2014   CLINICAL DATA:  Left hip pain after a fall.  EXAM: LUMBAR SPINE - COMPLETE 4+ VIEW  COMPARISON:  Lumbar spine 01/06/2008.  MRI lumbar spine 12/15/2007  FINDINGS: Degenerative changes in the lumbar spine with narrowed lumbar interspaces and  associated endplate hypertrophic changes. Mild scoliosis convex towards the left. Prominent endplate sclerosis from L2 through L5. No anterior subluxation. No vertebral compression. Visualized bone cortex appears intact.  IMPRESSION: Degenerative changes in the lumbar spine. No acute displaced fractures identified.   Electronically Signed   By: Lucienne Capers M.D.   On: 11/12/2014 05:12   Ct Head Wo Contrast  11/18/2014   CLINICAL DATA:  Generalized weakness, aphasia  EXAM: CT HEAD WITHOUT CONTRAST  TECHNIQUE: Contiguous axial images  were obtained from the base of the skull through the vertex without intravenous contrast.  COMPARISON:  07/21/2009  FINDINGS: No skull fracture is noted. Paranasal sinuses and mastoid air cells are unremarkable. No intracranial hemorrhage, mass effect or midline shift. There is mild cerebral atrophy. Atherosclerotic calcifications of carotid siphon. Periventricular and patchy subcortical white matter decreased attenuation probable due to chronic small vessel ischemic changes. No acute cortical infarction. No mass lesion is noted on this unenhanced scan.  IMPRESSION: No acute intracranial abnormality. Mild cerebral atrophy. Periventricular and patchy subcortical white matter decreased attenuation probable due to chronic small vessel ischemic changes. No definite acute cortical infarction.   Electronically Signed   By: Lahoma Crocker M.D.   On: 11/18/2014 15:41   Mr Brain Wo Contrast  11/18/2014   CLINICAL DATA:  Patient woke up with aphasia and generalized weakness. Last seen normal last night prior to bed. Today has unintelligible speech. Stroke risk factors include diabetes, hypertension, and chronic renal disease.  EXAM: MRI HEAD WITHOUT CONTRAST  TECHNIQUE: Multiplanar, multiecho pulse sequences of the brain and surrounding structures were obtained without intravenous contrast.  COMPARISON:  CT head earlier today.  MR brain 09/17/2006.  FINDINGS: No evidence for acute infarction,  hemorrhage, mass lesion, hydrocephalus, or extra-axial fluid. Generalized atrophy. Mild subcortical and periventricular T2 and FLAIR hyperintensities, likely chronic microvascular ischemic change.  Pituitary, pineal, and cerebellar tonsils unremarkable. No upper cervical lesions.  Flow voids are maintained throughout the carotid, basilar, and vertebral arteries. There are no areas of chronic hemorrhage. LEFT vertebral dominant.  Visualized calvarium, skull base, and upper cervical osseous structures unremarkable. Scalp and extracranial soft tissues, orbits, sinuses, and mastoids show no acute process. BILATERAL cataract extraction.  Compared to prior CT, good general agreement. Compared with prior MR, progression of atrophy and small vessel disease.  IMPRESSION: No acute stroke is evident. No intracranial mass lesion or other significant finding to explain the patient's speech difficulty and generalized weakness.  Atrophy and small vessel disease similar to prior CT.   Electronically Signed   By: Staci Righter M.D.   On: 11/18/2014 18:17   Mr Jodene Nam Head/brain Wo Cm  11/18/2014   CLINICAL DATA:  Patient woke up with aphasia and generalized weakness. Last seen normal last night prior to bed. Today has unintelligible speech. Stroke risk factors include diabetes, hypertension, and chronic renal disease.  EXAM: MRA HEAD WITHOUT CONTRAST  TECHNIQUE: Angiographic images of the Circle of Willis were obtained using MRA technique without intravenous contrast.  COMPARISON:  MRI brain performed earlier in the day.  FINDINGS: The internal carotid arteries show minor irregularity in their cavernous and supraclinoid segments, without flow reducing stenosis.  Normal-appearing basilar artery with vertebrals codominant. No proximal stenosis of the anterior, middle, or posterior cerebral arteries. Mild irregularity of the distal MCA and PCA branches suggesting intracranial atherosclerotic disease.  No cerebellar branch occlusion.   Duplicated RIGHT SCA branches.  No intracranial aneurysm.  IMPRESSION: No proximal intracranial stenosis or occlusion.  Mild irregularity of the distal MCA and PCA branches suggesting intracranial atherosclerotic disease.   Electronically Signed   By: Staci Righter M.D.   On: 11/18/2014 19:54   Dg Hip Unilat With Pelvis 1v Left  11/12/2014   CLINICAL DATA:  Patient fell after wandering around. Patient was found on the floor. Hip pain.  EXAM: DG HIP (WITH OR WITHOUT PELVIS) 1V*L*  COMPARISON:  Left hip 04/25/2010  FINDINGS: Postoperative change with previous bipolar left hip arthroplasty using non cemented femoral  component. Components appear well seated. No evidence of acute fracture or dislocation in the left hip. Heterotopic ossification around the greater trochanter. Pelvis appears intact without evidence of acute fracture or dislocation. SI joints and symphysis pubis are not displaced.  IMPRESSION: No acute bony abnormalities. Postoperative changes of left hip arthroplasty with heterotopic ossification around the greater trochanter.   Electronically Signed   By: Lucienne Capers M.D.   On: 11/12/2014 05:10    CBC  Recent Labs Lab 11/18/14 1505 11/18/14 1536 11/19/14 0858 11/20/14 0725  WBC 7.8  --  5.9 5.4  HGB 14.0 15.6* 12.8 13.0  HCT 43.7 46.0 40.7 41.3  PLT 238  --  211 209  MCV 91.8  --  92.1 92.8  MCH 29.4  --  29.0 29.2  MCHC 32.0  --  31.4 31.5  RDW 13.3  --  13.2 13.2  LYMPHSABS 3.0  --   --   --   MONOABS 0.7  --   --   --   EOSABS 0.1  --   --   --   BASOSABS 0.0  --   --   --     Chemistries   Recent Labs Lab 11/18/14 1505 11/18/14 1536 11/19/14 0858 11/20/14 0725  NA 139 140 138 141  K 4.6 4.4 4.4 4.4  CL 101 100* 102 102  CO2 30  --  28 29  GLUCOSE 152* 151* 255* 173*  BUN 22* 27* 20 28*  CREATININE 1.11* 1.10* 1.09* 1.55*  CALCIUM 10.0  --  9.6 9.7  AST 40  --   --   --   ALT 172*  --   --   --   ALKPHOS 114  --   --   --   BILITOT 0.3  --   --   --      ------------------------------------------------------------------------------------------------------------------ estimated creatinine clearance is 25.3 mL/min (by C-G formula based on Cr of 1.55). ------------------------------------------------------------------------------------------------------------------  Recent Labs  11/19/14 0602  HGBA1C 7.7*   ------------------------------------------------------------------------------------------------------------------  Recent Labs  11/19/14 0602  CHOL 196  HDL 33*  LDLCALC 85  TRIG 390*  CHOLHDL 5.9   ------------------------------------------------------------------------------------------------------------------ No results for input(s): TSH, T4TOTAL, T3FREE, THYROIDAB in the last 72 hours.  Invalid input(s): FREET3 ------------------------------------------------------------------------------------------------------------------ No results for input(s): VITAMINB12, FOLATE, FERRITIN, TIBC, IRON, RETICCTPCT in the last 72 hours.  Coagulation profile  Recent Labs Lab 11/18/14 1505  INR 1.06    No results for input(s): DDIMER in the last 72 hours.  Cardiac Enzymes No results for input(s): CKMB, TROPONINI, MYOGLOBIN in the last 168 hours.  Invalid input(s): CK ------------------------------------------------------------------------------------------------------------------ Invalid input(s): [place]  11/18/14 1500 11/19/14 1230 11/19/14 1631 11/19/14 2223 11/20/14 0655  GLUCAP 151* 207* 141* 163* 176*     RAI,RIPUDEEP M.D. [facility] 11/20/2014, 10:58 AM  Pager: [phone] Between 7am to 7pm - call Pager - [phone]  After 7pm go to [email or web address] - password [email or web address]  Call night coverage person covering after 7pm

## 2014-11-20 NOTE — Progress Notes (Signed)
Ms Weldin Daughter would like an update, you can call her cell phone. The number in the chart. Thanks

## 2014-11-21 LAB — GLUCOSE, CAPILLARY
GLUCOSE-CAPILLARY: 148 mg/dL — AB (ref 65–99)
GLUCOSE-CAPILLARY: 263 mg/dL — AB (ref 65–99)

## 2014-11-21 LAB — BASIC METABOLIC PANEL
ANION GAP: 9 (ref 5–15)
BUN: 25 mg/dL — ABNORMAL HIGH (ref 6–20)
CO2: 27 mmol/L (ref 22–32)
Calcium: 9.1 mg/dL (ref 8.9–10.3)
Chloride: 104 mmol/L (ref 101–111)
Creatinine, Ser: 1.18 mg/dL — ABNORMAL HIGH (ref 0.44–1.00)
GFR calc Af Amer: 48 mL/min — ABNORMAL LOW (ref >60)
GFR, EST NON AFRICAN AMERICAN: 41 mL/min — AB (ref >60)
GLUCOSE: 262 mg/dL — AB (ref 65–99)
POTASSIUM: 4.2 mmol/L (ref 3.5–5.1)
SODIUM: 140 mmol/L (ref 135–145)

## 2014-11-21 LAB — CBC
HCT: 39.1 % (ref 36.0–46.0)
Hemoglobin: 12.8 g/dL (ref 12.0–15.0)
MCH: 29.8 pg (ref 26.0–34.0)
MCHC: 32.7 g/dL (ref 30.0–36.0)
MCV: 90.9 fL (ref 78.0–100.0)
PLATELETS: 215 10*3/uL (ref 150–400)
RBC: 4.3 MIL/uL (ref 3.87–5.11)
RDW: 12.9 % (ref 11.5–15.5)
WBC: 5.9 10*3/uL (ref 4.0–10.5)

## 2014-11-21 LAB — URINE CULTURE

## 2014-11-21 MED ORDER — ASPIRIN 325 MG PO TBEC: 325.0000 mg | DELAYED_RELEASE_TABLET | Freq: Every day | ORAL | Status: DC

## 2014-11-21 MED ORDER — FUROSEMIDE 40 MG PO TABS: 40.0000 mg | ORAL_TABLET | Freq: Every day | ORAL | Status: DC

## 2014-11-21 MED ORDER — AMLODIPINE BESYLATE 5 MG PO TABS: 5.0000 mg | ORAL_TABLET | Freq: Every day | ORAL | Status: AC

## 2014-11-21 MED ORDER — FUROSEMIDE 20 MG PO TABS: 40.0000 mg | ORAL_TABLET | Freq: Every day | ORAL | Status: DC

## 2014-11-21 MED ORDER — ATORVASTATIN CALCIUM 10 MG PO TABS: 10.0000 mg | ORAL_TABLET | Freq: Every day | ORAL | Status: DC

## 2014-11-21 MED ORDER — AMLODIPINE BESYLATE 5 MG PO TABS
5.0000 mg | ORAL_TABLET | Freq: Every day | ORAL | Status: DC
  Administered 2014-11-21: 5 mg via ORAL
  Filled 2014-11-21: qty 1

## 2014-11-21 MED ORDER — AMLODIPINE BESYLATE 10 MG PO TABS: 10.0000 mg | ORAL_TABLET | Freq: Every day | ORAL | Status: DC

## 2014-11-21 NOTE — Discharge Summary (Signed)
Physician Discharge Summary   Patient ID: [name] MRN: [id number] DOB/AGE: December 30, 1929 81 y.o.  Admit date: 11/18/2014 Discharge date: 11/21/2014  Primary Care Physician:  Irven Shelling, MD  Discharge Diagnoses:    Acute encephalopathy improved  . TIA (transient ischemic attack) . Uncontrolled hypertension/hypertensive urgency  . Acute kidney injury  . Type 2 diabetes mellitus with hyperglycemia . Chronic diastolic heart failure . Obesity (BMI 35.0-39.9 without comorbidity) . generalized debility    Consults: None    Recommendations for Outpatient Follow-up:  Please follow BMET at the time of appointment  Home health, PT, OT, RN, SLP for therapy was arranged  TESTS THAT NEED FOLLOW-UP BMET   DIET: Carb modified  Allergies:   Allergies  Allergen Reactions  . Codeine Other (See Comments)    slurred speech     Discharge Medications:   Medication List    STOP taking these medications        cephALEXin 500 MG capsule  Commonly known as:  KEFLEX      TAKE these medications        acetaminophen 500 MG tablet  Commonly known as:  TYLENOL  Take 500 mg by mouth every 6 (six) hours as needed for mild pain or moderate pain.     alum & mag hydroxide-simeth 200-200-20 MG/5ML suspension  Commonly known as:  MAALOX/MYLANTA  Take 30 mLs by mouth every 4 (four) hours as needed for indigestion or heartburn.     amLODipine 5 MG tablet  Commonly known as:  NORVASC  Take 1 tablet (5 mg total) by mouth daily.     aspirin 325 MG EC tablet  Take 1 tablet (325 mg total) by mouth daily.     atorvastatin 10 MG tablet  Commonly known as:  LIPITOR  Take 10 mg by mouth every morning.     B-12 COMPLIANCE INJECTION IJ  Inject as directed every 30 (thirty) days.     cholecalciferol 1000 UNITS tablet  Commonly known as:  VITAMIN D  Take 2,000 Units by mouth every morning.     conjugated estrogens vaginal cream  Commonly known as:  PREMARIN  Place 1 Applicatorful  vaginally as needed (burning).     furosemide 20 MG tablet  Commonly known as:  LASIX  Take 2 tablets (40 mg total) by mouth daily.  Start taking on:  11/24/2014     glimepiride 1 MG tablet  Commonly known as:  AMARYL  Take 1 mg by mouth every morning.     lisinopril 20 MG tablet  Commonly known as:  PRINIVIL,ZESTRIL  Take 1 tablet (20 mg total) by mouth every evening.     lithium carbonate 300 MG CR tablet  Commonly known as:  LITHOBID  Take 150 mg by mouth every morning.     metoprolol 50 MG tablet  Commonly known as:  LOPRESSOR  Take 1 tablet (50 mg total) by mouth 2 (two) times daily.     mirtazapine 30 MG tablet  Commonly known as:  REMERON  Take 30 mg by mouth every evening.     nitroGLYCERIN 0.4 MG SL tablet  Commonly known as:  NITROSTAT  Place 1 tablet (0.4 mg total) under the tongue every 5 (five) minutes as needed for chest pain.     omeprazole 40 MG capsule  Commonly known as:  PRILOSEC  Take 40 mg by mouth every evening.     oxybutynin 5 MG tablet  Commonly known as:  DITROPAN  Take 2.5 mg  by mouth 2 (two) times daily.     pregabalin 75 MG capsule  Commonly known as:  LYRICA  Take 75 mg by mouth 2 (two) times daily.     risperiDONE 1 MG tablet  Commonly known as:  RISPERDAL  Take 0.5 mg by mouth every evening.     traMADol 50 MG tablet  Commonly known as:  ULTRAM  Take 1 tablet (50 mg total) by mouth 2 (two) times daily as needed (pain).     Vilazodone HCl 20 MG Tabs  Take 20 mg by mouth every morning.     zolpidem 5 MG tablet  Commonly known as:  AMBIEN  Take 5 mg by mouth at bedtime.         Brief H and P: For complete details please refer to admission H and P, but in briefPatient is a 81 year old female with diabetes mellitus, hypertension, chronic in disease, stage III, hyperlipidemia, GERD, recurrent falls presented to ED with acute encephalopathy, right facial drooping, dysarthria. Patient presented from home, lives with her husband.  Per the daughter, who reported patient woke up on the morning of admission with aphasia and generalized weakness. She was last seen normal on night before the admission. Normally she is able to converse. Patient's daughter also noticed drooping of the right side of the mouth. At baseline, patient is a total care and has a home health aid at home. Patient has had 5 falls in the past month. No prior history of stroke. The patient was admitted for further workup.  Hospital Course:  Acute encephalopathy, with facial drooping likely due to TIA (transient ischemic attack) versus hypertensive encephalopathy - MRI of the brain showed no acute stroke, no intracranial mass lesion or significant finding to explain patient's speech difficulty and generalized weakness. - MR angiogram showed no proximal intracranial stenosis or occlusion, intracranial atherosclerotic disease. - 2-D echo showed EF of 60%, normal wall motion.  - Carotid Dopplers showed no significant stenosis - PTOT evaluation recommended home health PT. home health PT, OT, SLP for speech therapy, RN was arranged. - Continue statin, placed on aspirin 325 mg daily  - Lipid panel showed LDL 85, continue statins  - UA negative for UTI   Mild Acute kidney injury - Creatinine trended up to 1.5 on 9/17. Lasix, lisinopril for placed on hold and placed on gentle hydration. Creatinine has improved to 1.1 at the time of discharge. Patient can restart lisinopril, hold Lasix for 2 more days and then restart.   Uncontrolled hypertension With likely hypertensive encephalopathy - BP consistently elevated at the time of admission in 200s, patient also placed on Norvasc besides lisinopril, Lasix, metoprolol. Please follow closely and adjust medications as needed   Type 2 diabetes mellitus with hyperglycemia - Hemoglobin A1c 7.7, patient was placed on sliding scale insulin while inpatient   Chronic diastolic heart failure - Currently stable,  euvolemic. 2-D echo showed EF of 60%, normal wall motion. Patient can restart Lasix in 2 days. Currently stable.   Obesity (BMI 35.0-39.9 without comorbidity) - Patient counseled on diet and weight control, PT evaluation   Day of Discharge BP 128/42 mmHg  Pulse 73  Temp(Src) 99 F (37.2 C) (Oral)  Resp 18  Ht 5' (1.524 m)  Wt 77.293 kg (170 lb 6.4 oz)  BMI 33.28 kg/m2  SpO2 92%  Physical Exam: General: Alert and awake oriented x3 not in any acute distress. HEENT: anicteric sclera, pupils reactive to light and accommodation CVS: S1-S2 clear no murmur  rubs or gallops Chest: clear to auscultation bilaterally, no wheezing rales or rhonchi Abdomen: soft nontender, nondistended, normal bowel sounds Extremities: no cyanosis, clubbing or edema noted bilaterally Neuro: Cranial nerves II-XII intact, no focal neurological deficits   The results of significant diagnostics from this hospitalization (including imaging, microbiology, ancillary and laboratory) are listed below for reference.    LAB RESULTS: Basic Metabolic Panel:  Recent Labs Lab 11/20/14 0725 11/21/14 0930  NA 141 140  K 4.4 4.2  CL 102 104  CO2 29 27  GLUCOSE 173* 262*  BUN 28* 25*  CREATININE 1.55* 1.18*  CALCIUM 9.7 9.1   Liver Function Tests:  Recent Labs Lab 11/18/14 1505  AST 40  ALT 172*  ALKPHOS 114  BILITOT 0.3  PROT 6.9  ALBUMIN 3.8   No results for input(s): LIPASE, AMYLASE in the last 168 hours. No results for input(s): AMMONIA in the last 168 hours. CBC:  Recent Labs Lab 11/18/14 1505  11/20/14 0725 11/21/14 0930  WBC 7.8  < > 5.4 5.9  NEUTROABS 4.0  --   --   --   HGB 14.0  < > 13.0 12.8  HCT 43.7  < > 41.3 39.1  MCV 91.8  < > 92.8 90.9  PLT 238  < > 209 215  < > = values in this interval not displayed. Cardiac Enzymes: No results for input(s): CKTOTAL, CKMB, CKMBINDEX, TROPONINI in the last 168 hours. BNP: Invalid input(s): POCBNP CBG:  Recent Labs Lab 11/20/14 2214  11/21/14 0658  GLUCAP 153* 148*    Significant Diagnostic Studies:  Dg Chest 2 View  11/18/2014   CLINICAL DATA:  Shortness of breath.  EXAM: CHEST  2 VIEW  COMPARISON:  07/18/2014  FINDINGS: Exam demonstrates low lung volumes without focal consolidation or effusion. Cardiomediastinal silhouette is within normal. There is calcified plaque over the aortic arch. There are mild degenerative changes of the spine.  IMPRESSION: Hypoinflation without acute cardiopulmonary disease.   Electronically Signed   By: Marin Olp M.D.   On: 11/18/2014 23:31   Ct Head Wo Contrast  11/18/2014   CLINICAL DATA:  Generalized weakness, aphasia  EXAM: CT HEAD WITHOUT CONTRAST  TECHNIQUE: Contiguous axial images were obtained from the base of the skull through the vertex without intravenous contrast.  COMPARISON:  07/21/2009  FINDINGS: No skull fracture is noted. Paranasal sinuses and mastoid air cells are unremarkable. No intracranial hemorrhage, mass effect or midline shift. There is mild cerebral atrophy. Atherosclerotic calcifications of carotid siphon. Periventricular and patchy subcortical white matter decreased attenuation probable due to chronic small vessel ischemic changes. No acute cortical infarction. No mass lesion is noted on this unenhanced scan.  IMPRESSION: No acute intracranial abnormality. Mild cerebral atrophy. Periventricular and patchy subcortical white matter decreased attenuation probable due to chronic small vessel ischemic changes. No definite acute cortical infarction.   Electronically Signed   By: Lahoma Crocker M.D.   On: 11/18/2014 15:41   Mr Brain Wo Contrast  11/18/2014   CLINICAL DATA:  Patient woke up with aphasia and generalized weakness. Last seen normal last night prior to bed. Today has unintelligible speech. Stroke risk factors include diabetes, hypertension, and chronic renal disease.  EXAM: MRI HEAD WITHOUT CONTRAST  TECHNIQUE: Multiplanar, multiecho pulse sequences of the brain and  surrounding structures were obtained without intravenous contrast.  COMPARISON:  CT head earlier today.  MR brain 09/17/2006.  FINDINGS: No evidence for acute infarction, hemorrhage, mass lesion, hydrocephalus, or extra-axial fluid. Generalized atrophy. Mild  subcortical and periventricular T2 and FLAIR hyperintensities, likely chronic microvascular ischemic change.  Pituitary, pineal, and cerebellar tonsils unremarkable. No upper cervical lesions.  Flow voids are maintained throughout the carotid, basilar, and vertebral arteries. There are no areas of chronic hemorrhage. LEFT vertebral dominant.  Visualized calvarium, skull base, and upper cervical osseous structures unremarkable. Scalp and extracranial soft tissues, orbits, sinuses, and mastoids show no acute process. BILATERAL cataract extraction.  Compared to prior CT, good general agreement. Compared with prior MR, progression of atrophy and small vessel disease.  IMPRESSION: No acute stroke is evident. No intracranial mass lesion or other significant finding to explain the patient's speech difficulty and generalized weakness.  Atrophy and small vessel disease similar to prior CT.   Electronically Signed   By: Staci Righter M.D.   On: 11/18/2014 18:17   Mr Jodene Nam Head/brain Wo Cm  11/18/2014   CLINICAL DATA:  Patient woke up with aphasia and generalized weakness. Last seen normal last night prior to bed. Today has unintelligible speech. Stroke risk factors include diabetes, hypertension, and chronic renal disease.  EXAM: MRA HEAD WITHOUT CONTRAST  TECHNIQUE: Angiographic images of the Circle of Willis were obtained using MRA technique without intravenous contrast.  COMPARISON:  MRI brain performed earlier in the day.  FINDINGS: The internal carotid arteries show minor irregularity in their cavernous and supraclinoid segments, without flow reducing stenosis.  Normal-appearing basilar artery with vertebrals codominant. No proximal stenosis of the anterior, middle,  or posterior cerebral arteries. Mild irregularity of the distal MCA and PCA branches suggesting intracranial atherosclerotic disease.  No cerebellar branch occlusion.  Duplicated RIGHT SCA branches.  No intracranial aneurysm.  IMPRESSION: No proximal intracranial stenosis or occlusion.  Mild irregularity of the distal MCA and PCA branches suggesting intracranial atherosclerotic disease.   Electronically Signed   By: Staci Righter M.D.   On: 11/18/2014 19:54    2D ECHO: Study Conclusions  - Left ventricle: The cavity size was normal. Wall thickness was increased in a pattern of moderate LVH. The estimated ejection fraction was 60%. Wall motion was normal; there were no regional wall motion abnormalities. - Aortic valve: Sclerosis without stenosis. There was no significant regurgitation. - Right ventricle: The cavity size was normal. Systolic function was normal. - Pulmonary arteries: PA peak pressure: 39 mm Hg (S). - Pericardium, extracardiac: A trivial pericardial effusion was identified posterior to the heart.   Disposition and Follow-up: Discharge Instructions    (HEART FAILURE PATIENTS) Call MD:  Anytime you have any of the following symptoms: 1) 3 pound weight gain in 24 hours or 5 pounds in 1 week 2) shortness of breath, with or without a dry hacking cough 3) swelling in the hands, feet or stomach 4) if you have to sleep on extra pillows at night in order to breathe.    Complete by:  As directed      Diet Carb Modified    Complete by:  As directed      Increase activity slowly    Complete by:  As directed             DISPOSITION: home    DISCHARGE FOLLOW-UP     Follow-up Information    Follow up with [facility].   Why:  home health physical and occupational therapy, nurse and social worker   Contact information:   [address] [place] [address] [phone]       Follow up with Irven Shelling, MD. Schedule an  appointment as soon as possible for a visit in 10 days.   Specialty:  Internal Medicine   Why:  for hospital follow-up, obtain labs BMET for renal function   Contact information:   [address] [place] [address] [phone]        Time spent on Discharge: 35 mins   Signed:   RAI,RIPUDEEP M.D. [facility] 11/21/2014, 10:47 AM Pager: [phone]

## 2014-11-21 NOTE — Progress Notes (Signed)
Utilization Review Completed.[name] T9/18/2016  

## 2014-11-21 NOTE — Progress Notes (Signed)
Patient and daughter given DC instructions, prescriptions and handouts on stroke prevention. Reviewed med schedule, and answered all questions. Patient dressed and and transferred to [facility]. Patient in [facility] escorted to lobby by staff. Patient to be transported home by family in private vehicle.

## 2014-11-21 NOTE — Progress Notes (Signed)
Occupational Therapy Evaluation Patient Details Name: [name] MRN: [id number] DOB: 07-02-1929 Today's Date: 11/21/2014    History of Present Illness Patient is a 81 year old female with diabetes mellitus, hypertension, chronic in disease, stage III, hyperlipidemia, GERD, recurrent falls presented to ED with acute encephalopathy, right facial drooping, dysarthria.  MRI negative for stroke.   Clinical Impression   Pt admitted with the above diagnoses and presents with below problem list. Pt will benefit from continued acute OT to address the below listed deficits and maximize independence with BADLs prior to d/c home with family assist. PTA pt was needing assist with bathing/dressing/transfers. Pt's daughter reports pt is not at baseline and is currently needing more assist than PTA. Pt awaiting d/c at time of OT evaluation. Discussed left equipment with daughter and CM, deferring lift equipment needs to [facility] agency evaluation. Spouse is primary caregiver and not present for session.       Follow Up Recommendations  Home health OT;Supervision/Assistance - 24 hour    Equipment Recommendations  Other (comment) (possible left equipment- will defer to [facility] agency assessment)    Recommendations for Other Services       Precautions / Restrictions Precautions Precautions: Fall Precaution Comments: h/o falls at home Restrictions Weight Bearing Restrictions: No      Mobility Bed Mobility Overal bed mobility: Needs Assistance;+2 for physical assistance Bed Mobility: Supine to Sit Rolling: Mod assist;+2 for physical assistance   Supine to sit: Mod assist;+2 for physical assistance     General bed mobility comments: able to move feet off edge of bed. +2 assist for supine<>EOB as well as to scoot up in bed.   Transfers Overall transfer level: Needs assistance Equipment used: Rolling walker (2 wheeled) Transfers: Sit to/from Stand Sit to Stand: +2 physical assistance;Mod assist Stand  pivot transfers: Mod assist;+2 physical assistance       General transfer comment: +2 for balance with pt difficulty advancing/coordinating movements of BLE. Cues for technique and hand placement.     Balance Overall balance assessment: Needs assistance Sitting-balance support: Bilateral upper extremity supported;Feet supported Sitting balance-Leahy Scale: Poor Sitting balance - Comments: left lean min A to close min guard  Postural control: Left lateral lean Standing balance support: Bilateral upper extremity supported;During functional activity Standing balance-Leahy Scale: Poor Standing balance comment: cues for more upright posture                            ADL Overall ADL's : Needs assistance/impaired Eating/Feeding: Minimal assistance;Sitting;Set up   Grooming: Moderate assistance Grooming Details (indicate cue type and reason): Limited AROM shoulder slexion BUE, weakness Upper Body Bathing: Maximal assistance;Sitting   Lower Body Bathing: Maximal assistance;Sit to/from stand;Total assistance   Upper Body Dressing : Sitting;Maximal assistance   Lower Body Dressing: Maximal assistance;Total assistance;Sit to/from stand   Toilet Transfer: +2 for physical assistance;Stand-pivot;BSC;RW Toilet Transfer Details (indicate cue type and reason): +2 and rw to transfer EOB to [facility]; Pt requesting no rw during BSC to EOB, +2 physical assist HHA.  Toileting- Clothing Manipulation and Hygiene: Moderate assistance;Sitting/lateral lean;Sit to/from stand Toileting - Clothing Manipulation Details (indicate cue type and reason): pt partially standing with mod A to access anterior pericare       General ADL Comments: Pt's daughter present and reports pt is weaker and needing more assist than baseline. Discussed possibility of lift equipment in home as pt's spouse has been managing transfers. CM to discuss with [facility] agency to incoporate  into their evaluation. Issued built up foam to  assist with grasping utensils.      Vision     Perception     Praxis      Pertinent Vitals/Pain Pain Assessment: Faces Faces Pain Scale: Hurts little more Pain Location: chronic back pain Pain Intervention(s): Limited activity within patient's tolerance;Monitored during session;Repositioned     Hand Dominance     Extremity/Trunk Assessment Upper Extremity Assessment Upper Extremity Assessment: Generalized weakness   Lower Extremity Assessment Lower Extremity Assessment: Defer to PT evaluation   Cervical / Trunk Assessment Cervical / Trunk Assessment: Other exceptions Cervical / Trunk Exceptions: generally rigid trunk, leans to left (not assessed, question scoliosis)   Communication Communication Communication: No difficulties (slurred speech noted)   Cognition Arousal/Alertness: Awake/alert Behavior During Therapy: WFL for tasks assessed/performed;Flat affect Overall Cognitive Status: Impaired/Different from baseline Area of Impairment: Problem solving;Safety/judgement     Memory: Decreased short-term memory;Decreased recall of precautions   Safety/Judgement: Decreased awareness of safety;Decreased awareness of deficits   Problem Solving: Slow processing;Decreased initiation;Difficulty sequencing;Requires verbal cues;Requires tactile cues     General Comments       Exercises       Shoulder Instructions      Home Living Family/patient expects to be discharged to:: Private residence Living Arrangements: Spouse/significant other Available Help at Discharge: Available 24 hours/day;Family   Home Access: Level entry     Home Layout: One level               Home Equipment: Walker - 2 wheels;Shower seat;Bedside commode;Wheelchair - manual          Prior Functioning/Environment Level of Independence: Needs assistance  Gait / Transfers Assistance Needed: reports doesn't walk much, transfers with assist ADL's / Homemaking Assistance Needed: aide  2x/week for bathing, husband assists with dressing; not able to do housework        OT Diagnosis:     OT Problem List: Impaired balance (sitting and/or standing);Decreased knowledge of use of DME or AE;Decreased knowledge of precautions;Impaired UE functional use;Obesity;Decreased strength;Decreased range of motion;Decreased activity tolerance;Decreased coordination;Decreased cognition;Decreased safety awareness   OT Treatment/Interventions:      OT Goals(Current goals can be found in the care plan section) Acute Rehab OT Goals Patient Stated Goal: To go home  OT Frequency:     Barriers to D/C:            Co-evaluation              End of Session Equipment Utilized During Treatment: Gait belt;Rolling walker Nurse Communication: Other (comment) (Nurse present for part of session.)  Activity Tolerance: Patient tolerated treatment well Patient left: in bed;with call bell/phone within reach;with bed alarm set;with family/visitor present   Time: 5027-7412 OT Time Calculation (min): 44 min Charges:  OT General Charges $OT Visit: 1 Procedure OT Evaluation $Initial OT Evaluation Tier I: 1 Procedure OT Treatments $Self Care/Home Management : 23-37 mins G-Codes:    Hortencia Pilar 2014-12-12, 1:38 PM

## 2014-11-21 NOTE — Care Management Note (Addendum)
Case Management Note  Patient Details  Name: [name] MRN: [id number] Date of Birth: 1929-07-14  Subjective/Objective:                  Marland Kitchen Stroke Symptoms         Action/Plan: Discharge planning  Expected Discharge Date:                  Expected Discharge Plan:  [place]  In-House Referral:     Discharge planning Services  CM Consult  Post Acute Care Choice:  Home Health Choice offered to:  Patient  DME Arranged:    DME Agency:  [facility] Arranged:  RN, PT, OT, Nurse's Aide [facility] Agency:     Status of Service:  Completed, signed off  Medicare Important Message Given:    Date Medicare IM Given:    Medicare IM give by:    Date Additional Medicare IM Given:    Additional Medicare Important Message give by:     If discussed at [place] of Stay Meetings, dates discussed:    Additional Comments: CM met with pt to offer choice of home health agency.  Pt chooses [facility] to render HHPT/OT/RN/SLP.  Pt states she does not need the aide as she has someone who comes to help bathe her and clean.  Address and contact information verified by pt.  Referral called to [facility] rep, Tiffany.  Pt has rolling walker, 3n1 and shower stool at home.  No other CM needs were communicated. Dellie Catholic, RN 11/21/2014, 9:59 AM

## 2015-01-13 ENCOUNTER — Observation Stay (HOSPITAL_COMMUNITY)
Admission: EM | Admit: 2015-01-13 | Discharge: 2015-01-17 | Disposition: A | Discharge status: Home Health | Payer: Medicare Other | Attending: Internal Medicine | Admitting: Internal Medicine

## 2015-01-13 ENCOUNTER — Inpatient Hospital Stay (HOSPITAL_COMMUNITY): Payer: Medicare Other

## 2015-01-13 ENCOUNTER — Encounter (HOSPITAL_COMMUNITY): Payer: Self-pay | Admitting: Emergency Medicine

## 2015-01-13 ENCOUNTER — Emergency Department (HOSPITAL_COMMUNITY): Payer: Medicare Other

## 2015-01-13 DIAGNOSIS — E785 Hyperlipidemia, unspecified: Secondary | ICD-10-CM | POA: Diagnosis not present

## 2015-01-13 DIAGNOSIS — N183 Chronic kidney disease, stage 3 unspecified: Secondary | ICD-10-CM | POA: Diagnosis present

## 2015-01-13 DIAGNOSIS — E1165 Type 2 diabetes mellitus with hyperglycemia: Secondary | ICD-10-CM | POA: Diagnosis not present

## 2015-01-13 DIAGNOSIS — Z7982 Long term (current) use of aspirin: Secondary | ICD-10-CM | POA: Insufficient documentation

## 2015-01-13 DIAGNOSIS — I129 Hypertensive chronic kidney disease with stage 1 through stage 4 chronic kidney disease, or unspecified chronic kidney disease: Secondary | ICD-10-CM | POA: Diagnosis not present

## 2015-01-13 DIAGNOSIS — I10 Essential (primary) hypertension: Secondary | ICD-10-CM | POA: Diagnosis present

## 2015-01-13 DIAGNOSIS — E669 Obesity, unspecified: Secondary | ICD-10-CM | POA: Diagnosis not present

## 2015-01-13 DIAGNOSIS — I161 Hypertensive emergency: Secondary | ICD-10-CM | POA: Insufficient documentation

## 2015-01-13 DIAGNOSIS — I5033 Acute on chronic diastolic (congestive) heart failure: Secondary | ICD-10-CM | POA: Diagnosis not present

## 2015-01-13 DIAGNOSIS — R918 Other nonspecific abnormal finding of lung field: Secondary | ICD-10-CM

## 2015-01-13 DIAGNOSIS — J9601 Acute respiratory failure with hypoxia: Secondary | ICD-10-CM | POA: Insufficient documentation

## 2015-01-13 DIAGNOSIS — Z79899 Other long term (current) drug therapy: Secondary | ICD-10-CM | POA: Diagnosis not present

## 2015-01-13 DIAGNOSIS — L89612 Pressure ulcer of right heel, stage 2: Secondary | ICD-10-CM | POA: Insufficient documentation

## 2015-01-13 DIAGNOSIS — Z66 Do not resuscitate: Secondary | ICD-10-CM | POA: Diagnosis not present

## 2015-01-13 DIAGNOSIS — Z6835 Body mass index (BMI) 35.0-35.9, adult: Secondary | ICD-10-CM | POA: Diagnosis not present

## 2015-01-13 DIAGNOSIS — IMO0001 Reserved for inherently not codable concepts without codable children: Secondary | ICD-10-CM | POA: Diagnosis present

## 2015-01-13 DIAGNOSIS — R0602 Shortness of breath: Secondary | ICD-10-CM | POA: Diagnosis present

## 2015-01-13 DIAGNOSIS — R531 Weakness: Secondary | ICD-10-CM | POA: Diagnosis not present

## 2015-01-13 DIAGNOSIS — I480 Paroxysmal atrial fibrillation: Secondary | ICD-10-CM | POA: Insufficient documentation

## 2015-01-13 DIAGNOSIS — I4892 Unspecified atrial flutter: Secondary | ICD-10-CM | POA: Insufficient documentation

## 2015-01-13 DIAGNOSIS — L97419 Non-pressure chronic ulcer of right heel and midfoot with unspecified severity: Secondary | ICD-10-CM | POA: Diagnosis present

## 2015-01-13 DIAGNOSIS — I5032 Chronic diastolic (congestive) heart failure: Secondary | ICD-10-CM | POA: Diagnosis present

## 2015-01-13 DIAGNOSIS — I509 Heart failure, unspecified: Secondary | ICD-10-CM

## 2015-01-13 LAB — BASIC METABOLIC PANEL
ANION GAP: 12 (ref 5–15)
BUN: 9 mg/dL (ref 6–20)
CHLORIDE: 99 mmol/L — AB (ref 101–111)
CO2: 29 mmol/L (ref 22–32)
Calcium: 9.5 mg/dL (ref 8.9–10.3)
Creatinine, Ser: 0.98 mg/dL (ref 0.44–1.00)
GFR calc non Af Amer: 52 mL/min — ABNORMAL LOW (ref >60)
Glucose, Bld: 234 mg/dL — ABNORMAL HIGH (ref 65–99)
Potassium: 3.8 mmol/L (ref 3.5–5.1)
SODIUM: 140 mmol/L (ref 135–145)

## 2015-01-13 LAB — GLUCOSE, CAPILLARY
GLUCOSE-CAPILLARY: 144 mg/dL — AB (ref 65–99)
Glucose-Capillary: 219 mg/dL — ABNORMAL HIGH (ref 65–99)
Glucose-Capillary: 192 mg/dL — ABNORMAL HIGH (ref 65–99)

## 2015-01-13 LAB — CBC
HCT: 39.8 % (ref 36.0–46.0)
Hemoglobin: 12.5 g/dL (ref 12.0–15.0)
MCH: 28.1 pg (ref 26.0–34.0)
MCHC: 31.4 g/dL (ref 30.0–36.0)
MCV: 89.4 fL (ref 78.0–100.0)
PLATELETS: 200 10*3/uL (ref 150–400)
RBC: 4.45 MIL/uL (ref 3.87–5.11)
RDW: 13 % (ref 11.5–15.5)
WBC: 7.6 10*3/uL (ref 4.0–10.5)

## 2015-01-13 LAB — BRAIN NATRIURETIC PEPTIDE: B NATRIURETIC PEPTIDE 5: 173.8 pg/mL — AB (ref 0.0–100.0)

## 2015-01-13 LAB — LITHIUM LEVEL: Lithium Lvl: 0.21 mmol/L — ABNORMAL LOW (ref 0.60–1.20)

## 2015-01-13 LAB — TROPONIN I
Troponin I: <0.03 ng/mL (ref <0.031)
Troponin I: <0.03 ng/mL (ref <0.031)

## 2015-01-13 LAB — I-STAT TROPONIN, ED: TROPONIN I, POC: 0 ng/mL (ref 0.00–0.08)

## 2015-01-13 MED ORDER — ACETAMINOPHEN 325 MG PO TABS
650.0000 mg | ORAL_TABLET | ORAL | Status: DC | PRN
  Administered 2015-01-14 – 2015-01-15 (×3): 650 mg via ORAL
  Filled 2015-01-13 (×3): qty 2

## 2015-01-13 MED ORDER — VILAZODONE HCL 20 MG PO TABS: 20.0000 mg | ORAL_TABLET | Freq: Every day | ORAL | Status: DC

## 2015-01-13 MED ORDER — FUROSEMIDE 40 MG PO TABS
40.0000 mg | ORAL_TABLET | Freq: Every day | ORAL | Status: DC
  Administered 2015-01-14: 40 mg via ORAL
  Filled 2015-01-13: qty 1

## 2015-01-13 MED ORDER — ONDANSETRON HCL 4 MG/2ML IJ SOLN: 4.0000 mg | Freq: Four times a day (QID) | INTRAMUSCULAR | Status: DC | PRN

## 2015-01-13 MED ORDER — VILAZODONE HCL 10 MG PO TABS: 20.0000 mg | ORAL_TABLET | Freq: Every day | ORAL | Status: DC

## 2015-01-13 MED ORDER — TRAMADOL HCL 50 MG PO TABS
50.0000 mg | ORAL_TABLET | Freq: Two times a day (BID) | ORAL | Status: DC
  Administered 2015-01-14 – 2015-01-17 (×7): 50 mg via ORAL
  Filled 2015-01-13 (×8): qty 1

## 2015-01-13 MED ORDER — FUROSEMIDE 10 MG/ML IJ SOLN
80.0000 mg | Freq: Once | INTRAMUSCULAR | Status: AC
  Administered 2015-01-13: 80 mg via INTRAVENOUS
  Filled 2015-01-13: qty 8

## 2015-01-13 MED ORDER — MIRTAZAPINE 15 MG PO TABS
30.0000 mg | ORAL_TABLET | Freq: Every evening | ORAL | Status: DC
  Administered 2015-01-13 – 2015-01-16 (×4): 30 mg via ORAL
  Filled 2015-01-13 (×4): qty 2

## 2015-01-13 MED ORDER — VILAZODONE HCL 20 MG PO TABS: 20.0000 mg | ORAL_TABLET | ORAL | Status: DC

## 2015-01-13 MED ORDER — AMLODIPINE BESYLATE 5 MG PO TABS
5.0000 mg | ORAL_TABLET | Freq: Every day | ORAL | Status: DC
  Administered 2015-01-13 – 2015-01-17 (×5): 5 mg via ORAL
  Filled 2015-01-13 (×6): qty 1

## 2015-01-13 MED ORDER — PANTOPRAZOLE SODIUM 40 MG PO TBEC
40.0000 mg | DELAYED_RELEASE_TABLET | Freq: Every day | ORAL | Status: DC
  Administered 2015-01-14 – 2015-01-17 (×4): 40 mg via ORAL
  Filled 2015-01-13 (×5): qty 1

## 2015-01-13 MED ORDER — SODIUM CHLORIDE 0.9 % IJ SOLN
3.0000 mL | Freq: Two times a day (BID) | INTRAMUSCULAR | Status: DC
  Administered 2015-01-13 – 2015-01-17 (×7): 3 mL via INTRAVENOUS

## 2015-01-13 MED ORDER — VITAMIN D 1000 UNITS PO TABS
2000.0000 [IU] | ORAL_TABLET | ORAL | Status: DC
  Administered 2015-01-14 – 2015-01-17 (×4): 2000 [IU] via ORAL
  Filled 2015-01-13 (×5): qty 2

## 2015-01-13 MED ORDER — LITHIUM CARBONATE ER 300 MG PO TBCR: 150.0000 mg | EXTENDED_RELEASE_TABLET | ORAL | Status: DC

## 2015-01-13 MED ORDER — SODIUM CHLORIDE 0.9 % IJ SOLN: 3.0000 mL | INTRAMUSCULAR | Status: DC | PRN

## 2015-01-13 MED ORDER — GLIMEPIRIDE 1 MG PO TABS
1.0000 mg | ORAL_TABLET | ORAL | Status: DC
  Administered 2015-01-14 – 2015-01-17 (×4): 1 mg via ORAL
  Filled 2015-01-13 (×5): qty 1

## 2015-01-13 MED ORDER — SODIUM CHLORIDE 0.9 % IV SOLN: 250.0000 mL | INTRAVENOUS | Status: DC | PRN

## 2015-01-13 MED ORDER — LITHIUM CARBONATE 150 MG PO CAPS
150.0000 mg | ORAL_CAPSULE | Freq: Every day | ORAL | Status: DC
  Administered 2015-01-14 – 2015-01-17 (×4): 150 mg via ORAL
  Filled 2015-01-13 (×5): qty 1

## 2015-01-13 MED ORDER — PREGABALIN 75 MG PO CAPS
75.0000 mg | ORAL_CAPSULE | Freq: Two times a day (BID) | ORAL | Status: DC
  Administered 2015-01-14 – 2015-01-17 (×7): 75 mg via ORAL
  Filled 2015-01-13 (×9): qty 1

## 2015-01-13 MED ORDER — ZOLPIDEM TARTRATE 5 MG PO TABS
5.0000 mg | ORAL_TABLET | Freq: Every day | ORAL | Status: DC
  Administered 2015-01-14: 5 mg via ORAL
  Filled 2015-01-13 (×2): qty 1

## 2015-01-13 MED ORDER — INSULIN ASPART 100 UNIT/ML ~~LOC~~ SOLN
0.0000 [IU] | Freq: Every day | SUBCUTANEOUS | Status: DC
  Administered 2015-01-14: 2 [IU] via SUBCUTANEOUS

## 2015-01-13 MED ORDER — ENOXAPARIN SODIUM 40 MG/0.4ML ~~LOC~~ SOLN
40.0000 mg | SUBCUTANEOUS | Status: DC
  Administered 2015-01-13 – 2015-01-17 (×5): 40 mg via SUBCUTANEOUS
  Filled 2015-01-13 (×5): qty 0.4

## 2015-01-13 MED ORDER — INSULIN ASPART 100 UNIT/ML ~~LOC~~ SOLN
0.0000 [IU] | Freq: Three times a day (TID) | SUBCUTANEOUS | Status: DC
  Administered 2015-01-13: 2 [IU] via SUBCUTANEOUS
  Administered 2015-01-13: 5 [IU] via SUBCUTANEOUS
  Administered 2015-01-14 (×3): 3 [IU] via SUBCUTANEOUS

## 2015-01-13 MED ORDER — OXYBUTYNIN CHLORIDE 5 MG PO TABS
2.5000 mg | ORAL_TABLET | Freq: Two times a day (BID) | ORAL | Status: DC
Start: 2015-01-13 — End: 2015-01-17
  Administered 2015-01-14 – 2015-01-17 (×7): 2.5 mg via ORAL
  Filled 2015-01-13 (×9): qty 1

## 2015-01-13 MED ORDER — LISINOPRIL 20 MG PO TABS
20.0000 mg | ORAL_TABLET | Freq: Every evening | ORAL | Status: DC
  Administered 2015-01-13: 20 mg via ORAL
  Filled 2015-01-13: qty 1

## 2015-01-13 MED ORDER — RISPERIDONE 1 MG PO TABS
0.5000 mg | ORAL_TABLET | Freq: Every evening | ORAL | Status: DC
  Administered 2015-01-13 – 2015-01-16 (×4): 0.5 mg via ORAL
  Filled 2015-01-13 (×4): qty 1

## 2015-01-13 MED ORDER — ASPIRIN EC 325 MG PO TBEC
325.0000 mg | DELAYED_RELEASE_TABLET | Freq: Every day | ORAL | Status: DC
  Administered 2015-01-14 – 2015-01-17 (×4): 325 mg via ORAL
  Filled 2015-01-13 (×5): qty 1

## 2015-01-13 MED ORDER — METOPROLOL TARTRATE 50 MG PO TABS
50.0000 mg | ORAL_TABLET | Freq: Two times a day (BID) | ORAL | Status: DC
  Administered 2015-01-14 – 2015-01-17 (×7): 50 mg via ORAL
  Filled 2015-01-13 (×8): qty 1

## 2015-01-13 MED ORDER — ATORVASTATIN CALCIUM 10 MG PO TABS
10.0000 mg | ORAL_TABLET | ORAL | Status: DC
  Administered 2015-01-14 – 2015-01-17 (×4): 10 mg via ORAL
  Filled 2015-01-13 (×4): qty 1

## 2015-01-13 NOTE — Progress Notes (Signed)
Utilization review completed. Cotton Beckley, RN, BSN. 

## 2015-01-13 NOTE — ED Notes (Signed)
Pt from home with c/o sob x several days and today woke up with chest heaviness. Pt htn upon ems arrival 280/160- hx stroke last month. Pt took morning meds pta and ems administered 325 asa and 3 nitro. Pt sts it helped cp a little.

## 2015-01-13 NOTE — H&P (Signed)
[facility] History and Physical                                                                                    [name], is a 81 y.o. female  MRN: [id number]   DOB - [birth year]  Admit Date - 01/13/2015  Outpatient Primary MD for the patient is Irven Shelling, MD  Referring MD: Sandi Mariscal / ER  With History of -  Past Medical History  Diagnosis Date  . Diabetes mellitus without complication ([place])   . Hypertension   . CKD (chronic kidney disease), stage III   . PVC (premature ventricular contraction)   . Hyperlipidemia   . GERD (gastroesophageal reflux disease)   . Osteopenia   . Chronic low back pain   . Postlaminectomy syndrome   . Right carpal tunnel syndrome   . Pernicious anemia   . Chronic interstitial cystitis   . Microhematuria   . Macular degeneration   . Chest pain     a. 07/2014 Neg MV, nl EF.      History reviewed. No pertinent past surgical history.  in for   Chief Complaint  Patient presents with  . Hypertension  . Chest Pain     HPI This is an 81 year old female patient with diabetes on oral hypoglycemic agents, hypertension, stage III chronic kidney disease, dyslipidemia, history of prior stroke, pernicious anemia and a history of chest pain with a negative Myoview study in 2016. She was recently discharged on 9/18 after being hospitalized for hypertensive urgency and acute encephalopathy. Echocardiogram that admission consistent with diastolic dysfunction. Time of discharge her blood pressure was well controlled with her preadmission medications in addition to Norvasc which was started at last admission. Patient returns to the ER for progressive shortness of breath for the past 3 days. This morning symptoms were significant enough to produce orthopnea. Despite sitting upright patient continued to have difficulty breathing stating she couldn't catch her breath. EMS arrived at the home and patient's initial blood pressure was 280/160. She was  given 325 mg of aspirin 3 nitroglycerin with improvement in her breathing and a concomitant sensation of heaviness in her chest. Patient was also found to be hypoxemic and 4 L nasal cannula oxygen was also applied.  Upon arrival to the ER patient was hemodynamically stable and afebrile. Initial blood pressure was 181/63, respirations 22 pulse 62. Attempts were made to discontinue her oxygen but her saturations decreased into the 80% range. Chest x-ray was consistent with pulmonary vascular congestion with a lenticular opacity centered at the minor fissure concerning for either pseudotumor or possibly mass. EKG was suggestive of atrial flutter with controlled ventricular response of 7070s per minute with very spiky flutter waves, QTC 544 ms, no definitive ischemic changes observed. Laboratory data unremarkable except for hyperglycemia glucose 234, slightly elevated BNP of 174, troponin was normal, CBC normal.   Review of Systems   In addition to the HPI above,  No Fever-chills, myalgias or other constitutional symptoms No Headache, changes with Vision or hearing, new weakness, tingling, numbness in any extremity, No problems swallowing food or Liquids, indigestion/reflux No Cough or palpitations No Abdominal pain, N/V;  no melena or hematochezia, no dark tarry stools, Bowel movements are regular, No dysuria, hematuria or flank pain No new joints pains-aches No recent weight gain or loss No polyuria, polydypsia or polyphagia,  *A full 10 point Review of Systems was done, except as stated above, all other Review of Systems were negative.  Social History Social History  Substance Use Topics  . Smoking status: Never Smoker   . Smokeless tobacco: Never Used  . Alcohol Use: No    Resides at: Private residence  Lives with: Spouse-daughter assists with day-to-day care  Ambulatory status: Ambulates with assistance and with walker; daughter reports patient has been doing much better with  utilization of physical therapy services   Family History Family History  Problem Relation Age of Onset  . Arthritis Mother   . Heart attack Father      Prior to Admission medications   Medication Sig Start Date End Date Taking? Authorizing Provider  acetaminophen (TYLENOL) 500 MG tablet Take 500 mg by mouth every 6 (six) hours as needed for mild pain or moderate pain.     Historical Provider, MD  alum & mag hydroxide-simeth (MAALOX/MYLANTA) 200-200-20 MG/5ML suspension Take 30 mLs by mouth every 4 (four) hours as needed for indigestion or heartburn. Patient not taking: Reported on 11/18/2014 07/22/14   Velta Addison Mikhail, DO  amLODipine (NORVASC) 5 MG tablet Take 1 tablet (5 mg total) by mouth daily. 11/21/14   Ripudeep Krystal Eaton, MD  aspirin EC 325 MG EC tablet Take 1 tablet (325 mg total) by mouth daily. 11/21/14   Ripudeep Krystal Eaton, MD  atorvastatin (LIPITOR) 10 MG tablet Take 10 mg by mouth every morning.     Historical Provider, MD  cholecalciferol (VITAMIN D) 1000 UNITS tablet Take 2,000 Units by mouth every morning.    Historical Provider, MD  conjugated estrogens (PREMARIN) vaginal cream Place 1 Applicatorful vaginally as needed (burning).    Historical Provider, MD  Cyanocobalamin (B-12 COMPLIANCE INJECTION IJ) Inject as directed every 30 (thirty) days.    Historical Provider, MD  furosemide (LASIX) 20 MG tablet Take 2 tablets (40 mg total) by mouth daily. 11/24/14   Ripudeep Krystal Eaton, MD  glimepiride (AMARYL) 1 MG tablet Take 1 mg by mouth every morning.    Historical Provider, MD  lisinopril (PRINIVIL,ZESTRIL) 20 MG tablet Take 1 tablet (20 mg total) by mouth every evening. 07/22/14   Maryann Mikhail, DO  lithium carbonate (LITHOBID) 300 MG CR tablet Take 150 mg by mouth every morning.    Historical Provider, MD  metoprolol (LOPRESSOR) 50 MG tablet Take 1 tablet (50 mg total) by mouth 2 (two) times daily. 07/22/14   Maryann Mikhail, DO  mirtazapine (REMERON) 30 MG tablet Take 30 mg by mouth every  evening.    Historical Provider, MD  nitroGLYCERIN (NITROSTAT) 0.4 MG SL tablet Place 1 tablet (0.4 mg total) under the tongue every 5 (five) minutes as needed for chest pain. 07/22/14   Maryann Mikhail, DO  omeprazole (PRILOSEC) 40 MG capsule Take 40 mg by mouth every evening.     Historical Provider, MD  oxybutynin (DITROPAN) 5 MG tablet Take 2.5 mg by mouth 2 (two) times daily.     Historical Provider, MD  pregabalin (LYRICA) 75 MG capsule Take 75 mg by mouth 2 (two) times daily.    Historical Provider, MD  risperiDONE (RISPERDAL) 1 MG tablet Take 0.5 mg by mouth every evening.     Historical Provider, MD  traMADol (ULTRAM) 50 MG tablet Take  1 tablet (50 mg total) by mouth 2 (two) times daily as needed (pain). Patient taking differently: Take 50 mg by mouth 2 (two) times daily.  07/22/14   Maryann Mikhail, DO  Vilazodone HCl 20 MG TABS Take 20 mg by mouth every morning.    Historical Provider, MD  zolpidem (AMBIEN) 5 MG tablet Take 5 mg by mouth at bedtime.     Historical Provider, MD    Allergies  Allergen Reactions  . Codeine Other (See Comments)    slurred speech    Physical Exam  Vitals  Blood pressure 158/57, pulse 61, temperature 99.2 F (37.3 C), temperature source Oral, resp. rate 21, SpO2 98 %.   General:  In no acute distress, appears stated age and somewhat pale  Psych:  Mostly flat affect, Awake Alert, Oriented X 3. Speech and thought patterns are mostly clear and appropriate the daughter reports has had chronic issues with intermittent dysarthria  Neuro:   No focal neurological deficits, CN II through XII intact, Strength 4/5 all 4 extremities, Sensation intact all 4 extremities. Does have difficulty repositioning self in bed during exam  ENT:  Ears and Eyes appear Normal, Conjunctivae clear, PER. Moist oral mucosa without erythema or exudates.  Neck:  Supple, No lymphadenopathy appreciated  Respiratory:  Symmetrical chest wall movement, Good air movement  bilaterally, bilateral crackles more prominent left side, 4 L oxygen  Cardiac:  RRR and bedside telemetry currently demonstrating sinus rhythm during my exam, No Murmurs, trace stable chronic bilateral LE edema noted, no JVD, No carotid bruits, peripheral pulses palpable at 2+  Abdomen:  Positive bowel sounds, Soft, Non tender, Non distended,  No masses appreciated, no obvious hepatosplenomegaly  Skin:  No Cyanosis, Normal Skin Turgor, No Skin Rash or Bruise. Note area on right heel with a circumferential ulcer about the size of a quarter with surrounding tissue dusky discolored and consistent with DTI  Extremities: Symmetrical without obvious trauma or injury,  no effusions.  Data Review  CBC  Recent Labs Lab 01/13/15 0926  WBC 7.6  HGB 12.5  HCT 39.8  PLT 200  MCV 89.4  MCH 28.1  MCHC 31.4  RDW 13.0    Chemistries   Recent Labs Lab 01/13/15 0926  NA 140  K 3.8  CL 99*  CO2 29  GLUCOSE 234*  BUN 9  CREATININE 0.98  CALCIUM 9.5    CrCl cannot be calculated (Unknown ideal weight.).  No results for input(s): TSH, T4TOTAL, T3FREE, THYROIDAB in the last 72 hours.  Invalid input(s): FREET3  Coagulation profile No results for input(s): INR, PROTIME in the last 168 hours.  No results for input(s): DDIMER in the last 72 hours.  Cardiac Enzymes No results for input(s): CKMB, TROPONINI, MYOGLOBIN in the last 168 hours.  Invalid input(s): CK  Invalid input(s): POCBNP  Urinalysis    Component Value Date/Time   COLORURINE YELLOW 11/20/2014 [place] 11/20/2014 1405   LABSPEC 1.010 11/20/2014 1405   PHURINE 5.0 11/20/2014 1405   GLUCOSEU NEGATIVE 11/20/2014 1405   HGBUR NEGATIVE 11/20/2014 1405   BILIRUBINUR NEGATIVE 11/20/2014 1405   KETONESUR NEGATIVE 11/20/2014 1405   PROTEINUR NEGATIVE 11/20/2014 1405   UROBILINOGEN 0.2 11/20/2014 1405   NITRITE NEGATIVE 11/20/2014 1405   LEUKOCYTESUR NEGATIVE 11/20/2014 1405    Imaging results:    Dg Chest 2 View  01/13/2015  CLINICAL DATA:  Shortness of breath for several days, [place] today with chest heaviness, significant elevated blood pressure on arrival 280/160,  stroke last month, personal history of diabetes mellitus, hypertension EXAM: CHEST  2 VIEW COMPARISON:  11/18/2014 FINDINGS: Enlargement of cardiac silhouette with pulmonary vascular congestion. Atherosclerotic calcification aorta. Bronchitic changes with mild interstitial changes in both lungs slightly increased versus previous exam favor mild pulmonary edema. Lenticular opacity RIGHT mid lung suboptimally visualized on lateral view, could represent pseudotumor from fluid in minor fissure though mass not excluded. No additional pleural effusion or pneumothorax identified. Diffuse osseous demineralization. BILATERAL glenohumeral degenerative changes. IMPRESSION: Enlargement of cardiac silhouette with pulmonary vascular congestion and probable pulmonary edema. Minimal RIGHT basilar atelectasis with lenticular opacity centered at the minor fissure, potentially representing pseudotumor from fluid within minor fissure though mass not entirely excluded. Either CT assessment or radiographic followup until resolution recommended to exclude mass lesion. Electronically Signed   By: Lavonia Dana M.D.   On: 01/13/2015 10:14     EKG: (Independently reviewed) suggestive of atrial flutter with controlled ventricular response of 7070s per minute with very spiky flutter waves, QTC 544 ms, no definitive ischemic changes observed   Assessment & Plan  Principal Problem:   Acute on chronic diastolic CHF (congestive heart failure), NYHA class 1 /Acute respiratory failure with hypoxia (HCC) -Admit to telemetry -Symptomatology suggestive of acute diastolic heart failure exacerbation -Give Lasix 80 mg IV 1 and monitor urinary output; resume home dose of Lasix 40 mg daily beginning on 11/11 -Continue ACE inhibitor and beta blocker as prior to admission  with focus on hypertensive control noting good percent with uncontrolled blood pressure partially exacerbated in setting of acute dyspnea with associated anxiety related to shortness of breath -Had echocardiogram during recent September admission  Active Problems:   Atrial flutter, paroxysmal (HCC) -Transient finding on initial EKG without RVR -Currently in sinus rhythm -CHADVASc = 8    Lingular mass -Seen on initial chest x-ray and when compared to previous chest x-ray in September was not present -Unclear if focal edema, focal infection or truly a mass -Noncontrasted CT to hopefully clarify    Uncontrolled hypertension -In setting of acute diastolic heart failure as well as associated anxiety and physical stressors related to acute dyspnea -Improved after application of oxygen as well as 3 nitroglycerin in the field -Resume preadmission medications -Lasix as above    Type 2 diabetes mellitus with hyperglycemia ([place]) -Patient reports took usual medication prior to arrival -Currently hyperglycemic -Check CBGs and provide SSI -Hemoglobin A1c in September 7.7 and given patient's advanced age would not attempt to decrease any lower than 7.0 -Continue Lyrica for associated neuropathy    CKD (chronic kidney disease), stage III -Current creatinine and GFR at baseline -Monitor closely while diuresing; at present has only been ordered one extra dose of IV Lasix on top of usual daily Lasix    Hyperlipidemia -Continue preadmission statin    ? Bipolar disorder -Continue preadmission lithium, Remeron, and Risperdal    Obesity (BMI 35.0-39.9 without comorbidity) (HCC) -Chronic ongoing issue and weight loss difficult based on patient's lack of mobility    Ulcer of right heel (HCC) -According to daughter has recently "opened up"-skin around the ulcer is darkened in appearance and consistent with prior DTI -Consult wound care RN for management recommendations    DVT Prophylaxis:  Lovenox  Family Communication:   Daughter at bedside  Code Status: DO NOT RESUSCITATE   Condition:  Stable  Discharge disposition: Anticipate discharge back to home environment once medically stable; hopefully within the next 72 hours  Time spent in minutes : 60  ELLIS,ALLISON L. ANP on 01/13/2015 at 12:09 PM  Between 7am to 7pm - Pager - [phone]  After 7pm go to [email or web address] - password [email or web address]  And look for the night coverage person covering me after hours  [facility]

## 2015-01-13 NOTE — Consult Note (Addendum)
WOC wound consult note Reason for Consult: Consult requested for right heel.  Pt had dry scab at this location, appears to be a previous deep tissue injury which removes easily and has evolved into a stage 2 pressure injury. Outer foot near 5th toe with dry callous 1X.5cm which also removes easily, revealing pink dry skin underneath, no open wound or drainage. Wound type: 3X3X.1cm area of dry scabbed loose skin; stage 2 area is .5X.5X.1cm after loose skin is removed Pressure Ulcer POA: Yes Wound bed: dark red and moist Drainage (amount, consistency, odor) No odor or drainage Periwound: Further loose skin surrounding will also eventually peel, affected area approx 2X2cm Dressing procedure/placement/frequency: Float heel to reduce pressure.  Foam dressing to promote healing and protect from further injury.  Discussed plan of care with patient and she verbalized understanding. Please re-consult if further assistance is needed.  Thank-you,  Julien Girt MSN, [place], [place], [place], [place]

## 2015-01-13 NOTE — Progress Notes (Signed)
Pt is very sleepy tonight, not able to take PO's, MD aware, ordered to hold meds for tonight, will continue to moitor, thanks [place]

## 2015-01-13 NOTE — ED Provider Notes (Signed)
CSN: [id number]     Arrival date & time 01/13/15  0908 History   First MD Initiated Contact with Patient 01/13/15 0914     Chief Complaint  Patient presents with  . Hypertension  . Chest Pain    HPI   [name] is a 81 y.o. female with a PMH of HLD, HTN, DM, CKD, CVA who presents to the ED with shortness of breath. She reports she has experienced intermittent shortness of breath over the past 2-3 days, and states when she woke up this morning, she felt like she "couldn't breathe." She reports associated chest heaviness. She denies precipitating or alleviating factors. She was given 326 ASA and 3 nitro by EMS with some symptom relief. She denies fever, chills, cough, congestion, dizziness, lightheadedness, abdominal pain, N/V/D/C, lower extremity edema, recent travel or immobility, history of malignancy, history of DVT or PE.   Past Medical History  Diagnosis Date  . Diabetes mellitus without complication ([place])   . Hypertension   . CKD (chronic kidney disease), stage III   . PVC (premature ventricular contraction)   . Hyperlipidemia   . GERD (gastroesophageal reflux disease)   . Osteopenia   . Chronic low back pain   . Postlaminectomy syndrome   . Right carpal tunnel syndrome   . Pernicious anemia   . Chronic interstitial cystitis   . Microhematuria   . Macular degeneration   . Chest pain     a. 07/2014 Neg MV, nl EF.   History reviewed. No pertinent past surgical history. Family History  Problem Relation Age of Onset  . Arthritis Mother   . Heart attack Father    Social History  Substance Use Topics  . Smoking status: Never Smoker   . Smokeless tobacco: Never Used  . Alcohol Use: No   OB History    No data available      Review of Systems  Constitutional: Negative for fever and chills.  HENT: Negative for congestion.   Respiratory: Positive for shortness of breath. Negative for cough.   Cardiovascular: Positive for chest pain. Negative for leg swelling.   Gastrointestinal: Negative for nausea, vomiting, abdominal pain, diarrhea and constipation.  Neurological: Positive for headaches. Negative for dizziness and light-headedness.       Reports headache s/p nitro.  All other systems reviewed and are negative.     Allergies  Codeine  Home Medications   Prior to Admission medications   Medication Sig Start Date End Date Taking? Authorizing Provider  acetaminophen (TYLENOL) 500 MG tablet Take 500 mg by mouth every 6 (six) hours as needed for mild pain or moderate pain.     Historical Provider, MD  alum & mag hydroxide-simeth (MAALOX/MYLANTA) 200-200-20 MG/5ML suspension Take 30 mLs by mouth every 4 (four) hours as needed for indigestion or heartburn. Patient not taking: Reported on 11/18/2014 07/22/14   Velta Addison Mikhail, DO  amLODipine (NORVASC) 5 MG tablet Take 1 tablet (5 mg total) by mouth daily. 11/21/14   Ripudeep Krystal Eaton, MD  aspirin EC 325 MG EC tablet Take 1 tablet (325 mg total) by mouth daily. 11/21/14   Ripudeep Krystal Eaton, MD  atorvastatin (LIPITOR) 10 MG tablet Take 10 mg by mouth every morning.     Historical Provider, MD  cholecalciferol (VITAMIN D) 1000 UNITS tablet Take 2,000 Units by mouth every morning.    Historical Provider, MD  conjugated estrogens (PREMARIN) vaginal cream Place 1 Applicatorful vaginally as needed (burning).    Historical Provider, MD  Cyanocobalamin (B-12 COMPLIANCE INJECTION IJ) Inject as directed every 30 (thirty) days.    Historical Provider, MD  furosemide (LASIX) 20 MG tablet Take 2 tablets (40 mg total) by mouth daily. 11/24/14   Ripudeep Krystal Eaton, MD  glimepiride (AMARYL) 1 MG tablet Take 1 mg by mouth every morning.    Historical Provider, MD  lisinopril (PRINIVIL,ZESTRIL) 20 MG tablet Take 1 tablet (20 mg total) by mouth every evening. 07/22/14   Maryann Mikhail, DO  lithium carbonate (LITHOBID) 300 MG CR tablet Take 150 mg by mouth every morning.    Historical Provider, MD  metoprolol (LOPRESSOR) 50 MG tablet  Take 1 tablet (50 mg total) by mouth 2 (two) times daily. 07/22/14   Maryann Mikhail, DO  mirtazapine (REMERON) 30 MG tablet Take 30 mg by mouth every evening.    Historical Provider, MD  nitroGLYCERIN (NITROSTAT) 0.4 MG SL tablet Place 1 tablet (0.4 mg total) under the tongue every 5 (five) minutes as needed for chest pain. 07/22/14   Maryann Mikhail, DO  omeprazole (PRILOSEC) 40 MG capsule Take 40 mg by mouth every evening.     Historical Provider, MD  oxybutynin (DITROPAN) 5 MG tablet Take 2.5 mg by mouth 2 (two) times daily.     Historical Provider, MD  pregabalin (LYRICA) 75 MG capsule Take 75 mg by mouth 2 (two) times daily.    Historical Provider, MD  risperiDONE (RISPERDAL) 1 MG tablet Take 0.5 mg by mouth every evening.     Historical Provider, MD  traMADol (ULTRAM) 50 MG tablet Take 1 tablet (50 mg total) by mouth 2 (two) times daily as needed (pain). Patient taking differently: Take 50 mg by mouth 2 (two) times daily.  07/22/14   Maryann Mikhail, DO  Vilazodone HCl 20 MG TABS Take 20 mg by mouth every morning.    Historical Provider, MD  zolpidem (AMBIEN) 5 MG tablet Take 5 mg by mouth at bedtime.     Historical Provider, MD    BP 166/58 mmHg  Pulse 58  Temp(Src) 99.2 F (37.3 C) (Oral)  Resp 20  SpO2 98% Physical Exam  Constitutional: She appears well-developed and well-nourished. No distress.  HENT:  Head: Normocephalic and atraumatic.  Right Ear: External ear normal.  Left Ear: External ear normal.  Nose: Nose normal.  Mouth/Throat: Uvula is midline, oropharynx is clear and moist and mucous membranes are normal.  Eyes: Conjunctivae, EOM and lids are normal. Pupils are equal, round, and reactive to light. Right eye exhibits no discharge. Left eye exhibits no discharge. No scleral icterus.  Neck: Normal range of motion. Neck supple.  Cardiovascular: Normal rate, regular rhythm, normal heart sounds, intact distal pulses and normal pulses.   Pulmonary/Chest: Effort normal and  breath sounds normal. No respiratory distress. She has no wheezes. She has no rales. She exhibits no tenderness.  Abdominal: Soft. Normal appearance and bowel sounds are normal. She exhibits no distension and no mass. There is no tenderness. There is no rigidity, no rebound and no guarding.  Musculoskeletal: Normal range of motion. She exhibits no edema or tenderness.  Neurological: She is alert. She has normal strength. No cranial nerve deficit or sensory deficit.  Oriented to person and place, which family member states is unchanged from baseline.  Skin: Skin is warm, dry and intact. No rash noted. She is not diaphoretic. No erythema. No pallor.  Psychiatric: She has a normal mood and affect. Her speech is normal and behavior is normal.  Nursing note and vitals  reviewed.   ED Course  Procedures (including critical care time)  Labs Review Labs Reviewed  BASIC METABOLIC PANEL - Abnormal; Notable for the following:    Chloride 99 (*)    Glucose, Bld 234 (*)    GFR calc non Af Amer 52 (*)    All other components within normal limits  BRAIN NATRIURETIC PEPTIDE - Abnormal; Notable for the following:    B Natriuretic Peptide 173.8 (*)    All other components within normal limits  CBC  I-STAT TROPOININ, ED    Imaging Review Dg Chest 2 View  01/13/2015  CLINICAL DATA:  Shortness of breath for several days, [place] today with chest heaviness, significant elevated blood pressure on arrival 280/160, stroke last month, personal history of diabetes mellitus, hypertension EXAM: CHEST  2 VIEW COMPARISON:  11/18/2014 FINDINGS: Enlargement of cardiac silhouette with pulmonary vascular congestion. Atherosclerotic calcification aorta. Bronchitic changes with mild interstitial changes in both lungs slightly increased versus previous exam favor mild pulmonary edema. Lenticular opacity RIGHT mid lung suboptimally visualized on lateral view, could represent pseudotumor from fluid in minor fissure though mass  not excluded. No additional pleural effusion or pneumothorax identified. Diffuse osseous demineralization. BILATERAL glenohumeral degenerative changes. IMPRESSION: Enlargement of cardiac silhouette with pulmonary vascular congestion and probable pulmonary edema. Minimal RIGHT basilar atelectasis with lenticular opacity centered at the minor fissure, potentially representing pseudotumor from fluid within minor fissure though mass not entirely excluded. Either CT assessment or radiographic followup until resolution recommended to exclude mass lesion. Electronically Signed   By: Lavonia Dana M.D.   On: 01/13/2015 10:14     I have personally reviewed and evaluated these images and lab results as part of my medical decision-making.   EKG Interpretation   Date/Time:  Thursday January 13 2015 09:15:36 EST Ventricular Rate:  77 PR Interval:    QRS Duration: 96 QT Interval:  481 QTC Calculation: 544 R Axis:   0 Text Interpretation:  Atrial flutter Low voltage, precordial leads  Borderline repolarization abnormality Prolonged QT interval aflutter  Confirmed by Gerald Leitz ([id number]) on 01/13/2015 9:49:33 AM      MDM   Final diagnoses:  Shortness of breath    81 year old female presents with shortness of breath x 2-3 days and chest heaviness this morning. Was given 326 ASA and 3 nitro by EMS with some symptom relief. Denies fever, chills, cough, congestion, dizziness, lightheadedness, abdominal pain, N/V/D/C, lower extremity edema, recent travel or immobility, history of malignancy, history of DVT or PE. Of note, patient was admitted 09/15 through 09/18 for acute encephalopathy.  Patient is afebrile. Vital signs stable. Heart regular rate and rhythm. Lungs clear to auscultation bilaterally. Abdomen soft, nontender, nondistended. No significant lower extremity edema. Normal neuro exam with no focal deficit.  EKG remarkable for atrial flutter, heart rate 77. Troponin negative. Will obtain delta  troponin. CBC negative for leukocytosis or anemia. CMP remarkable for glucose 234. Chest x-ray demonstrates enlargement of cardiac silhouette with pulmonary vascular congestion and probable pulmonary edema, minimal right basilar atelectasis with reticular opacity at the minor fissure, potentially representing pseudotumor from fluid, though mass not entirely excluded.  Patient not on oxygen at home, though on 4L on arrival to the ED. Attempted to trial patient off supplemental oxygen, which led to desat to high 80s. Placed patient back on 2L O2 with improvement in sat to mid 90s. Hospitalist consulted for admission given shortness of breath and new oxygen requirement. Spoke with hospitalist, who will admit the patient  for further evaluation and management.  BP 166/58 mmHg  Pulse 58  Temp(Src) 99.2 F (37.3 C) (Oral)  Resp 20  SpO2 98%       Marella Chimes, PA-C 01/13/15 1218  Courteney Julio Alm, MD 01/13/15 1504

## 2015-01-14 ENCOUNTER — Inpatient Hospital Stay (HOSPITAL_COMMUNITY): Payer: Medicare Other

## 2015-01-14 DIAGNOSIS — E1165 Type 2 diabetes mellitus with hyperglycemia: Secondary | ICD-10-CM | POA: Diagnosis not present

## 2015-01-14 DIAGNOSIS — I5033 Acute on chronic diastolic (congestive) heart failure: Secondary | ICD-10-CM

## 2015-01-14 DIAGNOSIS — N183 Chronic kidney disease, stage 3 (moderate): Secondary | ICD-10-CM

## 2015-01-14 DIAGNOSIS — I10 Essential (primary) hypertension: Secondary | ICD-10-CM | POA: Diagnosis not present

## 2015-01-14 DIAGNOSIS — E669 Obesity, unspecified: Secondary | ICD-10-CM

## 2015-01-14 DIAGNOSIS — J9601 Acute respiratory failure with hypoxia: Secondary | ICD-10-CM | POA: Diagnosis not present

## 2015-01-14 DIAGNOSIS — I4892 Unspecified atrial flutter: Secondary | ICD-10-CM | POA: Diagnosis not present

## 2015-01-14 LAB — GLUCOSE, CAPILLARY
GLUCOSE-CAPILLARY: 179 mg/dL — AB (ref 65–99)
GLUCOSE-CAPILLARY: 200 mg/dL — AB (ref 65–99)
GLUCOSE-CAPILLARY: 171 mg/dL — AB (ref 65–99)
GLUCOSE-CAPILLARY: 207 mg/dL — AB (ref 65–99)
Glucose-Capillary: 172 mg/dL — ABNORMAL HIGH (ref 65–99)

## 2015-01-14 LAB — BASIC METABOLIC PANEL
Anion gap: 11 (ref 5–15)
BUN: 13 mg/dL (ref 6–20)
CALCIUM: 9 mg/dL (ref 8.9–10.3)
CO2: 30 mmol/L (ref 22–32)
CREATININE: 0.97 mg/dL (ref 0.44–1.00)
Chloride: 102 mmol/L (ref 101–111)
GFR calc Af Amer: >60 mL/min (ref >60)
GFR calc non Af Amer: 53 mL/min — ABNORMAL LOW (ref >60)
GLUCOSE: 186 mg/dL — AB (ref 65–99)
Potassium: 3.4 mmol/L — ABNORMAL LOW (ref 3.5–5.1)
Sodium: 143 mmol/L (ref 135–145)

## 2015-01-14 LAB — TROPONIN I: Troponin I: <0.03 ng/mL (ref <0.031)

## 2015-01-14 LAB — BRAIN NATRIURETIC PEPTIDE: B NATRIURETIC PEPTIDE 5: 45 pg/mL (ref 0.0–100.0)

## 2015-01-14 MED ORDER — POTASSIUM CHLORIDE CRYS ER 20 MEQ PO TBCR
40.0000 meq | EXTENDED_RELEASE_TABLET | Freq: Two times a day (BID) | ORAL | Status: AC
  Administered 2015-01-14 (×2): 40 meq via ORAL
  Filled 2015-01-14 (×2): qty 2

## 2015-01-14 MED ORDER — FUROSEMIDE 10 MG/ML IJ SOLN
40.0000 mg | Freq: Two times a day (BID) | INTRAMUSCULAR | Status: DC
  Administered 2015-01-14 – 2015-01-15 (×2): 40 mg via INTRAVENOUS
  Filled 2015-01-14 (×2): qty 4

## 2015-01-14 MED ORDER — VILAZODONE HCL 20 MG PO TABS
20.0000 mg | ORAL_TABLET | Freq: Every day | ORAL | Status: DC
  Administered 2015-01-14 – 2015-01-17 (×4): 20 mg via ORAL
  Filled 2015-01-14 (×4): qty 1

## 2015-01-14 MED ORDER — LISINOPRIL 40 MG PO TABS
40.0000 mg | ORAL_TABLET | Freq: Every evening | ORAL | Status: DC
  Administered 2015-01-14 – 2015-01-15 (×2): 40 mg via ORAL
  Filled 2015-01-14 (×2): qty 1

## 2015-01-14 NOTE — Progress Notes (Signed)
[facility] PROGRESS NOTE  Filed Weights   01/13/15 1228  Weight: 77.883 kg (171 lb 11.2 oz)        Intake/Output Summary (Last 24 hours) at 01/14/15 1056 Last data filed at 01/14/15 1004  Gross per 24 hour  Intake    922 ml  Output   1425 ml  Net   -503 ml     Assessment/Plan: Essential hypertension, malignant/ Acute on chronic diastolic CHF (congestive heart failure), NYHA class 1 (HCC)/ Acute respiratory failure with hypoxia (HCC)/Uncontrolled hypertension: - Blood pressure still elevated continue IV diuresis. - Moderate diuresis will increase IV Lasix monitor electrolytes, daily weights. Restrict her fluid intake and put on a low-sodium diet. The lingular mass was fluid overload. Blood pressure still elevated increase fosinopril.  Atrial flutter, paroxysmal ([place]): - She does remain rate controlled - Chads score greater than to continue aspirin 325.  Ulcer of right heel [facility]): Consult care.  Obesity (BMI 35.0-39.9 without comorbidity) ([place]): Counseling.  CKD (chronic kidney disease), stage III: At baseline continue to monitor.  Type 2 diabetes mellitus with hyperglycemia ([place]): SSI, glymiperide. Good control.  Code Status: full Family Communication: daughter  Disposition Plan: inpatient   Consultants:  none  Procedures: ECHO: pending  Antibiotics:  None  HPI/Subjective: She relates her breathing is much better she would like to home.  Objective: Filed Vitals:   01/14/15 0517 01/14/15 0600 01/14/15 0816 01/14/15 1038  BP:  153/68 176/64   Pulse: 82 76 87   Temp: 98.8 F (37.1 C)  97.9 F (36.6 C)   TempSrc: Oral  Axillary   Resp: 20  20   Height:      Weight:      SpO2: 97%  99% 99%     Exam:  General: Alert, awake, oriented x3, in no acute distress.  HEENT: No bruits, no goiter.  Heart: Regular rate and rhythm, cannot appreciate JVD Lungs: Good air movement, crackles on the left with no sounds on the lower right Abdomen:  Soft, nontender, nondistended, positive bowel sounds.  Neuro: Grossly intact, nonfocal.   Data Reviewed: Basic Metabolic Panel:  Recent Labs Lab 01/13/15 0926 01/14/15 0407  NA 140 143  K 3.8 3.4*  CL 99* 102  CO2 29 30  GLUCOSE 234* 186*  BUN 9 13  CREATININE 0.98 0.97  CALCIUM 9.5 9.0   Liver Function Tests: No results for input(s): AST, ALT, ALKPHOS, BILITOT, PROT, ALBUMIN in the last 168 hours. No results for input(s): LIPASE, AMYLASE in the last 168 hours. No results for input(s): AMMONIA in the last 168 hours. CBC:  Recent Labs Lab 01/13/15 0926  WBC 7.6  HGB 12.5  HCT 39.8  MCV 89.4  PLT 200   Cardiac Enzymes:  Recent Labs Lab 01/13/15 1545 01/13/15 2158 01/14/15 0407  TROPONINI <0.03 <0.03 <0.03   BNP (last 3 results)  Recent Labs  07/18/14 1216 01/13/15 0926  BNP 250.6* 173.8*    ProBNP (last 3 results) No results for input(s): PROBNP in the last 8760 hours.  CBG:  Recent Labs Lab 01/13/15 1336 01/13/15 1721 01/13/15 2104 01/14/15 0012 01/14/15 0603  GLUCAP 144* 219* 192* 172* 179*    No results found for this or any previous visit (from the past 240 hour(s)).   Studies: Dg Chest 2 View  01/13/2015  CLINICAL DATA:  Shortness of breath for several days, [place] today with chest heaviness, significant elevated blood pressure on arrival 280/160, stroke last month, personal history of diabetes mellitus,  hypertension EXAM: CHEST  2 VIEW COMPARISON:  11/18/2014 FINDINGS: Enlargement of cardiac silhouette with pulmonary vascular congestion. Atherosclerotic calcification aorta. Bronchitic changes with mild interstitial changes in both lungs slightly increased versus previous exam favor mild pulmonary edema. Lenticular opacity RIGHT mid lung suboptimally visualized on lateral view, could represent pseudotumor from fluid in minor fissure though mass not excluded. No additional pleural effusion or pneumothorax identified. Diffuse osseous  demineralization. BILATERAL glenohumeral degenerative changes. IMPRESSION: Enlargement of cardiac silhouette with pulmonary vascular congestion and probable pulmonary edema. Minimal RIGHT basilar atelectasis with lenticular opacity centered at the minor fissure, potentially representing pseudotumor from fluid within minor fissure though mass not entirely excluded. Either CT assessment or radiographic followup until resolution recommended to exclude mass lesion. Electronically Signed   By: Lavonia Dana M.D.   On: 01/13/2015 10:14   Ct Chest Wo Contrast  01/13/2015  CLINICAL DATA:  81 year old diabetic hypertensive female with chronic kidney disease and chest pain with shortness breath for days. Subsequent encounter. EXAM: CT CHEST WITHOUT CONTRAST TECHNIQUE: Multidetector CT imaging of the chest was performed following the standard protocol without IV contrast. COMPARISON:  01/13/2015 and 11/18/2014 chest x-ray. 04/19/2003 chest CT. FINDINGS: Patchy parenchymal changes right upper lobe within right middle lobe. Septal thickening. Oblong opacity within the minor fissure corresponding to chest x-ray abnormality. Tiny right pleural effusion/ pleural thickening. Mediastinal adenopathy most notable pretracheal region. Findings may reflect result of congestive heart failure with fluid in the minor fissure. Infection cannot be excluded if of high clinical concern. Recommend treating any clinically suspected congestive heart failure with follow-up chest x-ray in the next 4 weeks to confirm that findings clear as expected for benign process. The minor fissure abnormality would be unusual for a primary lung cancer (developed in a short period time when compared to 11/18/2014). Heart size top-normal. Minimal pericardial fluid. Calcification mitral and aortic valve. Coronary artery calcifications. Calcification thoracic aorta with slight ectasia. Calcification left subclavian artery. Minimal degenerative changes throughout  the thoracic spine without compression fracture. Unenhanced visualized upper abdominal structures unremarkable. IMPRESSION: Patchy parenchymal changes right upper lobe within right middle lobe. Septal thickening. Oblong opacity within the minor fissure corresponding to chest x-ray abnormality. Tiny right pleural effusion/ pleural thickening. Mediastinal adenopathy most notable pretracheal region. Findings most suggestive of result of congestive heart failure with fluid in the minor fissure. Infection could not be excluded if of high clinical concern. Recommend treating any clinically suspected congestive heart failure with follow-up chest x-ray in the next 4 weeks. Electronically Signed   By: Genia Del M.D.   On: 01/13/2015 15:24   Dg Chest Port 1 View  01/14/2015  CLINICAL DATA:  CHF and shortness of breath EXAM: PORTABLE CHEST 1 VIEW COMPARISON:  Yesterday FINDINGS: Chronic cardiomegaly. Stable aortic and hilar contours. Re- demonstrated ovoid opacity along the right minor fissure, likely trapped fluid. Persistent diffuse interstitial coarsening which has the appearance of pulmonary edema on prior chest CT. No effusion. No air leak. IMPRESSION: 1. Stable mild CHF. 2. Stable opacity at the minor fissure, likely fissural fluid based on recent CT. Electronically Signed   By: Monte Fantasia M.D.   On: 01/14/2015 06:39    Scheduled Meds: . amLODipine  5 mg Oral Daily  . aspirin EC  325 mg Oral Daily  . atorvastatin  10 mg Oral BH-q7a  . cholecalciferol  2,000 Units Oral BH-q7a  . enoxaparin (LOVENOX) injection  40 mg Subcutaneous Q24H  . furosemide  40 mg Oral Daily  . glimepiride  1 mg Oral BH-q7a  . insulin aspart  0-15 Units Subcutaneous TID WC  . insulin aspart  0-5 Units Subcutaneous QHS  . lisinopril  20 mg Oral QPM  . lithium carbonate  150 mg Oral Daily  . metoprolol  50 mg Oral BID  . mirtazapine  30 mg Oral QPM  . oxybutynin  2.5 mg Oral BID  . pantoprazole  40 mg Oral Daily  .  pregabalin  75 mg Oral BID  . risperiDONE  0.5 mg Oral QPM  . sodium chloride  3 mL Intravenous Q12H  . traMADol  50 mg Oral BID  . Vilazodone HCl  20 mg Oral Daily  . zolpidem  5 mg Oral QHS   Continuous Infusions:    Charlynne Cousins  [facility] Pager [phone]  If 7PM-7AM, please contact night-coverage at [email or web address], password [facility] 01/14/2015, 10:56 AM  LOS: 1 day

## 2015-01-14 NOTE — Progress Notes (Signed)
Pt converted to NSR at 1539, will continue to monitor

## 2015-01-14 NOTE — Evaluation (Signed)
Physical Therapy Evaluation Patient Details Name: [name] MRN: [id number] DOB: [birth year] Today's Date: 01/14/2015   History of Present Illness  81 yo female with onset of chest pain, SOB and CHF was admitted with acute respiratory failure and has R heel ulcer.  Clinical Impression  Pt was assessed with family in attendance for gait and transfers, noting her functional level as compared to PLOF.  Her husband can assist at home but cannot maneuver at least initially without hands on assistance.  Fall risk will make HHPT needed and pt can practice inclines with therapist to ease her entrance or exit to her house.    Follow Up Recommendations Home health PT;Supervision/Assistance - 24 hour    Equipment Recommendations  None recommended by PT    Recommendations for Other Services Rehab consult     Precautions / Restrictions Precautions Precautions: Fall Precaution Comments: telemetry Restrictions Weight Bearing Restrictions: No      Mobility  Bed Mobility               General bed mobility comments: up in chair when PT entered  Transfers Overall transfer level: Needs assistance Equipment used: Rolling walker (2 wheeled);1 person hand held assist Transfers: Sit to/from [facility] Sit to Stand: Min guard;Min assist Stand pivot transfers: Min guard       General transfer comment: reminders for hand placement and to set up for safety  Ambulation/Gait Ambulation/Gait assistance: Min guard;Min assist Ambulation Distance (Feet): 40 Feet Assistive device: Rolling walker (2 wheeled);1 person hand held assist Gait Pattern/deviations: Step-to pattern;Step-through pattern;Decreased stride length;Wide base of support Gait velocity: slow and careful Gait velocity interpretation: Below normal speed for age/gender General Gait Details: struggling a bit to turn walker but can control with extra time  Stairs            Wheelchair Mobility    Modified  [place] (Stroke Patients Only)       Balance Overall balance assessment: Needs assistance Sitting-balance support: Feet supported Sitting balance-Leahy Scale: Fair     Standing balance support: Bilateral upper extremity supported Standing balance-Leahy Scale: Poor                               Pertinent Vitals/Pain Pain Assessment: No/denies pain    Home Living Family/patient expects to be discharged to:: Private residence Living Arrangements: Spouse/significant other Available Help at Discharge: Available 24 hours/day;Family Type of Home: House Home Access: Ramped entrance     Home Layout: Two level;Able to live on main level with bedroom/bathroom Home Equipment: Gilford Rile - 2 wheels;Shower seat;Bedside commode;Wheelchair - [employer] - 4 wheels      Prior Function Level of Independence: Needs assistance   Gait / Transfers Assistance Needed: mod I to min guard with RW  ADL's / Homemaking Assistance Needed: family to assist with house        Hand Dominance        Extremity/Trunk Assessment   Upper Extremity Assessment: Overall WFL for tasks assessed           Lower Extremity Assessment: Generalized weakness      Cervical / Trunk Assessment: Kyphotic  Communication   Communication: No difficulties  Cognition Arousal/Alertness: Awake/alert Behavior During Therapy: WFL for tasks assessed/performed Overall Cognitive Status: Within Functional Limits for tasks assessed                      General Comments General comments (  skin integrity, edema, etc.): Pt was able to move with minor assistance, very motivated and following intructions.  Has recently completed HHPT and so will restart this for her.    Exercises        Assessment/Plan    PT Assessment Patient needs continued PT services  PT Diagnosis Generalized weakness   PT Problem List Decreased strength;Decreased range of motion;Decreased activity tolerance;Decreased  balance;Decreased mobility;Decreased coordination;Decreased knowledge of use of DME;Decreased safety awareness;Cardiopulmonary status limiting activity  PT Treatment Interventions DME instruction;Gait training;Functional mobility training;Therapeutic exercise;Therapeutic activities;Balance training;Neuromuscular re-education;Patient/family education   PT Goals (Current goals can be found in the Care Plan section) Acute Rehab PT Goals Patient Stated Goal: to walk better and feel better PT Goal Formulation: With patient/family Time For Goal Achievement: 01/28/15 Potential to Achieve Goals: Good    Frequency Min 3X/week   Barriers to discharge Other (comment) (has ramped entrance which may be a struggle) Has limited endurance and some safety issues which will require husband to be very available to her initially then should resolve    Co-evaluation               End of Session Equipment Utilized During Treatment: Gait belt Activity Tolerance: Patient tolerated treatment well;Patient limited by fatigue Patient left: in chair;with call bell/phone within reach;with family/visitor present (supportive daughter) Nurse Communication: Mobility status         Time: [id number] PT Time Calculation (min) (ACUTE ONLY): 30 min   Charges:   PT Evaluation $Initial PT Evaluation Tier I: 1 Procedure PT Treatments $Gait Training: 8-22 mins   PT G CodesRamond Dial 01-24-15, 3:43 PM  Mee Hives, PT MS Acute Rehab Dept. Number: [phone] [id number] and [place] [phone]

## 2015-01-14 NOTE — Care Management Obs Status (Signed)
MEDICARE OBSERVATION STATUS NOTIFICATION   Patient Details  Name: [name] MRN: [id number] Date of Birth: 08-20-1929   Medicare Observation Status Notification Given:       Royston Bake, RN 01/14/2015, 1:42 PM

## 2015-01-14 NOTE — Care Management Note (Signed)
Case Management Note  Patient Details  Name: [name] MRN: [id number] Date of Birth: 06/07/1929  Subjective/Objective:      Admitted with CHF              Action/Plan: Patient lives at home with spouse, was active with [facility] and wants to continue to use their services. Butch Penny with [facility] called to resume her [facility] services. No DME needed, has a walker at home. No problems getting her medication she use [facility] mail order and it arrives through the mail. Daughter [name] will be getting scales for the patient (that talks/pt cannot see the numbers on the scale). Attending MD at discharge please enter the face to face medicare document for [facility] services fin Epic.  Expected Discharge Date:   possibly 01/15/2015               Expected Discharge Plan:  [facility]  Discharge planning Services  CM Consult    Choice offered to:  Adult Children  HH Arranged:  RN, PT, OT, Nurse's Aide [place] Agency:     Status of Service:  In process, will continue to follow  Sherrilyn Rist [id number] 01/14/2015, 1:45 PM

## 2015-01-15 DIAGNOSIS — J9601 Acute respiratory failure with hypoxia: Secondary | ICD-10-CM | POA: Diagnosis not present

## 2015-01-15 DIAGNOSIS — I4892 Unspecified atrial flutter: Secondary | ICD-10-CM

## 2015-01-15 DIAGNOSIS — I5033 Acute on chronic diastolic (congestive) heart failure: Secondary | ICD-10-CM | POA: Diagnosis not present

## 2015-01-15 DIAGNOSIS — N183 Chronic kidney disease, stage 3 (moderate): Secondary | ICD-10-CM | POA: Diagnosis not present

## 2015-01-15 LAB — BASIC METABOLIC PANEL
ANION GAP: 8 (ref 5–15)
BUN: 16 mg/dL (ref 6–20)
CHLORIDE: 100 mmol/L — AB (ref 101–111)
CO2: 33 mmol/L — AB (ref 22–32)
CREATININE: 1.18 mg/dL — AB (ref 0.44–1.00)
Calcium: 9.1 mg/dL (ref 8.9–10.3)
GFR calc non Af Amer: 41 mL/min — ABNORMAL LOW (ref >60)
GFR, EST AFRICAN AMERICAN: 48 mL/min — AB (ref >60)
Glucose, Bld: 280 mg/dL — ABNORMAL HIGH (ref 65–99)
POTASSIUM: 4 mmol/L (ref 3.5–5.1)
SODIUM: 141 mmol/L (ref 135–145)

## 2015-01-15 LAB — GLUCOSE, CAPILLARY
GLUCOSE-CAPILLARY: 176 mg/dL — AB (ref 65–99)
GLUCOSE-CAPILLARY: 271 mg/dL — AB (ref 65–99)
GLUCOSE-CAPILLARY: 191 mg/dL — AB (ref 65–99)
Glucose-Capillary: 234 mg/dL — ABNORMAL HIGH (ref 65–99)

## 2015-01-15 MED ORDER — FUROSEMIDE 40 MG PO TABS: 40.0000 mg | ORAL_TABLET | Freq: Every day | ORAL | Status: DC

## 2015-01-15 MED ORDER — FUROSEMIDE 40 MG PO TABS
40.0000 mg | ORAL_TABLET | Freq: Every day | ORAL | Status: DC
  Administered 2015-01-16: 40 mg via ORAL
  Filled 2015-01-15: qty 1

## 2015-01-15 MED ORDER — INSULIN ASPART 100 UNIT/ML ~~LOC~~ SOLN
0.0000 [IU] | Freq: Three times a day (TID) | SUBCUTANEOUS | Status: DC
Start: 2015-01-15 — End: 2015-01-17
  Administered 2015-01-15: 5 [IU] via SUBCUTANEOUS
  Administered 2015-01-15 (×2): 8 [IU] via SUBCUTANEOUS
  Administered 2015-01-16: 5 [IU] via SUBCUTANEOUS
  Administered 2015-01-16: 3 [IU] via SUBCUTANEOUS
  Administered 2015-01-17: 8 [IU] via SUBCUTANEOUS
  Administered 2015-01-17: 3 [IU] via SUBCUTANEOUS

## 2015-01-15 MED ORDER — POTASSIUM CHLORIDE CRYS ER 20 MEQ PO TBCR: 40.0000 meq | EXTENDED_RELEASE_TABLET | Freq: Two times a day (BID) | ORAL | Status: DC

## 2015-01-15 NOTE — Progress Notes (Signed)
[facility] PROGRESS NOTE  Filed Weights   01/13/15 1228 01/14/15 1900  Weight: 77.883 kg (171 lb 11.2 oz) 75.814 kg (167 lb 2.2 oz)        Intake/Output Summary (Last 24 hours) at 01/15/15 1030 Last data filed at 01/15/15 1016  Gross per 24 hour  Intake   1300 ml  Output   1375 ml  Net    -75 ml     Assessment/Plan: Essential hypertension, malignant/ Acute on chronic diastolic CHF (congestive heart failure), NYHA class 1 (HCC)/ Acute respiratory failure with hypoxia (HCC)/Uncontrolled hypertension: - Blood pressure has improved. Poor recordings of I's and O's - Transition her Lasix to orals, there has been mild increase in creatinine. Recheck a basic metabolic panel in the morning. Upper lungs are diet. - Blood pressure still elevated increase lisinopril.  Atrial flutter, paroxysmal ([place]): - She does remain rate controlled - Chads score greater than 2 continue aspirin 325.  Ulcer of right heel [facility]): Consult care.  Obesity (BMI 35.0-39.9 without comorbidity) ([place]): Counseling.  CKD (chronic kidney disease), stage III: At baseline continue to monitor.  Type 2 diabetes mellitus with hyperglycemia ([place]): SSI, glymiperide. Good control.  Code Status: full Family Communication: daughter  Disposition Plan: inpatient   Consultants:  none  Procedures: ECHO: pending  Antibiotics:  None  HPI/Subjective: She relates her breathing is much better. She denies any cough with eating or drinking.  Objective: Filed Vitals:   01/14/15 2004 01/15/15 0039 01/15/15 0841 01/15/15 1002  BP: 177/63 155/48  140/80  Pulse: 78 74    Temp: 98.3 F (36.8 C) 98.1 F (36.7 C)    TempSrc: Oral Oral    Resp: 20 20    Height:      Weight:      SpO2: 94% 92% 96%      Exam:  General: Alert, awake, oriented x3, in no acute distress.  HEENT: No bruits, no goiter.  Heart: Regular rate and rhythm, cannot appreciate JVD Lungs: Good air movement, crackles on the left  with no sounds on the lower right Abdomen: Soft, nontender, nondistended, positive bowel sounds.  Neuro: Grossly intact, nonfocal.   Data Reviewed: Basic Metabolic Panel:  Recent Labs Lab 01/13/15 0926 01/14/15 0407 01/15/15 0233  NA 140 143 141  K 3.8 3.4* 4.0  CL 99* 102 100*  CO2 29 30 33*  GLUCOSE 234* 186* 280*  BUN 9 13 16   CREATININE 0.98 0.97 1.18*  CALCIUM 9.5 9.0 9.1   Liver Function Tests: No results for input(s): AST, ALT, ALKPHOS, BILITOT, PROT, ALBUMIN in the last 168 hours. No results for input(s): LIPASE, AMYLASE in the last 168 hours. No results for input(s): AMMONIA in the last 168 hours. CBC:  Recent Labs Lab 01/13/15 0926  WBC 7.6  HGB 12.5  HCT 39.8  MCV 89.4  PLT 200   Cardiac Enzymes:  Recent Labs Lab 01/13/15 1545 01/13/15 2158 01/14/15 0407  TROPONINI <0.03 <0.03 <0.03   BNP (last 3 results)  Recent Labs  07/18/14 1216 01/13/15 0926 01/14/15 1040  BNP 250.6* 173.8* 45.0    ProBNP (last 3 results) No results for input(s): PROBNP in the last 8760 hours.  CBG:  Recent Labs Lab 01/14/15 0603 01/14/15 1134 01/14/15 1646 01/14/15 2141 01/15/15 0641  GLUCAP 179* 200* 171* 207* 176*    No results found for this or any previous visit (from the past 240 hour(s)).   Studies: Ct Chest Wo Contrast  01/13/2015  CLINICAL DATA:  81 year old diabetic hypertensive female with chronic kidney disease and chest pain with shortness breath for days. Subsequent encounter. EXAM: CT CHEST WITHOUT CONTRAST TECHNIQUE: Multidetector CT imaging of the chest was performed following the standard protocol without IV contrast. COMPARISON:  01/13/2015 and 11/18/2014 chest x-ray. 04/19/2003 chest CT. FINDINGS: Patchy parenchymal changes right upper lobe within right middle lobe. Septal thickening. Oblong opacity within the minor fissure corresponding to chest x-ray abnormality. Tiny right pleural effusion/ pleural thickening. Mediastinal adenopathy  most notable pretracheal region. Findings may reflect result of congestive heart failure with fluid in the minor fissure. Infection cannot be excluded if of high clinical concern. Recommend treating any clinically suspected congestive heart failure with follow-up chest x-ray in the next 4 weeks to confirm that findings clear as expected for benign process. The minor fissure abnormality would be unusual for a primary lung cancer (developed in a short period time when compared to 11/18/2014). Heart size top-normal. Minimal pericardial fluid. Calcification mitral and aortic valve. Coronary artery calcifications. Calcification thoracic aorta with slight ectasia. Calcification left subclavian artery. Minimal degenerative changes throughout the thoracic spine without compression fracture. Unenhanced visualized upper abdominal structures unremarkable. IMPRESSION: Patchy parenchymal changes right upper lobe within right middle lobe. Septal thickening. Oblong opacity within the minor fissure corresponding to chest x-ray abnormality. Tiny right pleural effusion/ pleural thickening. Mediastinal adenopathy most notable pretracheal region. Findings most suggestive of result of congestive heart failure with fluid in the minor fissure. Infection could not be excluded if of high clinical concern. Recommend treating any clinically suspected congestive heart failure with follow-up chest x-ray in the next 4 weeks. Electronically Signed   By: Genia Del M.D.   On: 01/13/2015 15:24   Dg Chest Port 1 View  01/14/2015  CLINICAL DATA:  CHF and shortness of breath EXAM: PORTABLE CHEST 1 VIEW COMPARISON:  Yesterday FINDINGS: Chronic cardiomegaly. Stable aortic and hilar contours. Re- demonstrated ovoid opacity along the right minor fissure, likely trapped fluid. Persistent diffuse interstitial coarsening which has the appearance of pulmonary edema on prior chest CT. No effusion. No air leak. IMPRESSION: 1. Stable mild CHF. 2. Stable  opacity at the minor fissure, likely fissural fluid based on recent CT. Electronically Signed   By: Monte Fantasia M.D.   On: 01/14/2015 06:39    Scheduled Meds: . amLODipine  5 mg Oral Daily  . aspirin EC  325 mg Oral Daily  . atorvastatin  10 mg Oral BH-q7a  . cholecalciferol  2,000 Units Oral BH-q7a  . enoxaparin (LOVENOX) injection  40 mg Subcutaneous Q24H  . furosemide  40 mg Intravenous Q12H  . glimepiride  1 mg Oral BH-q7a  . insulin aspart  0-15 Units Subcutaneous TID WC  . insulin aspart  0-5 Units Subcutaneous QHS  . lisinopril  40 mg Oral QPM  . lithium carbonate  150 mg Oral Daily  . metoprolol  50 mg Oral BID  . mirtazapine  30 mg Oral QPM  . oxybutynin  2.5 mg Oral BID  . pantoprazole  40 mg Oral Daily  . pregabalin  75 mg Oral BID  . risperiDONE  0.5 mg Oral QPM  . sodium chloride  3 mL Intravenous Q12H  . traMADol  50 mg Oral BID  . Vilazodone HCl  20 mg Oral Daily  . zolpidem  5 mg Oral QHS   Continuous Infusions:    Charlynne Cousins  [facility] Pager [phone]  If 7PM-7AM, please contact night-coverage at [email or web address], password [facility] 01/15/2015, 10:30 AM  LOS: 2 days

## 2015-01-15 NOTE — Evaluation (Signed)
Occupational Therapy Evaluation and Discharge Patient Details Name: [name] MRN: [id number] DOB: 1929-09-18 Today's Date: 01/15/2015    History of Present Illness 81 yo female with onset of chest pain, SOB and CHF was admitted with acute respiratory failure and has R heel ulcer.   Clinical Impression   This 81 yo female admitted with above presents to acute OT with decreased balance and decreased mobility all affecting her ability to care for herself and A with her care. She will benefit from continued OT at home and this has been set up. Acute OT will sign off.    Follow Up Recommendations  Home health OT    Equipment Recommendations  None recommended by OT       Precautions / Restrictions Precautions Precautions: Fall Restrictions Weight Bearing Restrictions: No      Mobility Bed Mobility               General bed mobility comments: up in chair when OT entered  Transfers Overall transfer level: Needs assistance   Transfers: Sit to/from Stand;Squat Pivot Transfers Sit to Stand: Min guard   Squat pivot transfers: Min guard               ADL Overall ADL's : Needs assistance/impaired Eating/Feeding: Independent;Sitting   Grooming: Set up;Sitting   Upper Body Bathing: Set up;Sitting   Lower Body Bathing: Moderate assistance (min guard sit<>stand)   Upper Body Dressing : Set up;Sitting   Lower Body Dressing: Maximal assistance (min guard sit<>stand)   Toilet Transfer: Min guard;Squat-pivot;BSC   Toileting- Clothing Manipulation and Hygiene: Moderate assistance Toileting - Clothing Manipulation Details (indicate cue type and reason): can do front peri needs help with back peri             Vision Additional Comments: No change from baseline          Pertinent Vitals/Pain Pain Assessment: No/denies pain     Hand Dominance Right   Extremity/Trunk Assessment Upper Extremity Assessment Upper Extremity Assessment: Generalized  weakness           Communication Communication Communication: No difficulties   Cognition Arousal/Alertness: Awake/alert Behavior During Therapy: WFL for tasks assessed/performed Overall Cognitive Status: Within Functional Limits for tasks assessed                                Home Living Family/patient expects to be discharged to:: Private residence Living Arrangements: Spouse/significant other Available Help at Discharge: Available 24 hours/day;Family (PCA who comes in 2 times a week for 2 hours to help with showerint) Type of Home: House Home Access: Ramped entrance     Home Layout: Two level;Able to live on main level with bedroom/bathroom   Alternate Level Stairs-Rails: Right Bathroom Shower/Tub: Occupational psychologist: Standard     Home Equipment: Environmental consultant - 2 wheels;Shower seat;Bedside commode;Wheelchair - [employer] - 4 wheels;Hand held shower head          Prior Functioning/Environment Level of Independence: Needs assistance  Gait / Transfers Assistance Needed: mod I to min guard with RW--pt reports someone is always with her when she is up on her feet ADL's / Homemaking Assistance Needed: PCA comes in 2 times a week to help with shower. Husband helps with LBD, pt can do UBD post set up        OT Diagnosis: Generalized weakness   OT Problem List: Decreased strength  OT Goals(Current goals can be found in the care plan section) Acute Rehab OT Goals Patient Stated Goal: to go home tomorrow  OT Frequency:                End of Session Nurse Communication:  (NT: pt urinated and was left in "hat" in [facility])  Activity Tolerance: Patient tolerated treatment well Patient left: in chair;with call bell/phone within reach;with chair alarm set   Time: [id number] OT Time Calculation (min): 14 min Charges:  OT General Charges $OT Visit: 1 Procedure OT Evaluation $Initial OT Evaluation Tier I: 1 Procedure G-Codes: OT G-codes **NOT  FOR INPATIENT CLASS** Functional Assessment Tool Used: Clinical observation Functional Limitation: Self care Self Care Current Status [id number]): At least 60 percent but less than 80 percent impaired, limited or restricted Self Care Goal Status [id number]): At least 60 percent but less than 80 percent impaired, limited or restricted Self Care Discharge Status [phone]): At least 60 percent but less than 80 percent impaired, limited or restricted  Almon Register [id number] 01/15/2015, 5:17 PM

## 2015-01-16 DIAGNOSIS — J9601 Acute respiratory failure with hypoxia: Secondary | ICD-10-CM | POA: Diagnosis not present

## 2015-01-16 DIAGNOSIS — I5033 Acute on chronic diastolic (congestive) heart failure: Secondary | ICD-10-CM | POA: Diagnosis not present

## 2015-01-16 DIAGNOSIS — I10 Essential (primary) hypertension: Secondary | ICD-10-CM | POA: Diagnosis not present

## 2015-01-16 DIAGNOSIS — E1165 Type 2 diabetes mellitus with hyperglycemia: Secondary | ICD-10-CM | POA: Diagnosis not present

## 2015-01-16 LAB — GLUCOSE, CAPILLARY
GLUCOSE-CAPILLARY: 193 mg/dL — AB (ref 65–99)
GLUCOSE-CAPILLARY: 245 mg/dL — AB (ref 65–99)
Glucose-Capillary: 116 mg/dL — ABNORMAL HIGH (ref 65–99)
Glucose-Capillary: 171 mg/dL — ABNORMAL HIGH (ref 65–99)

## 2015-01-16 LAB — BASIC METABOLIC PANEL
ANION GAP: 8 (ref 5–15)
BUN: 23 mg/dL — ABNORMAL HIGH (ref 6–20)
CHLORIDE: 101 mmol/L (ref 101–111)
CO2: 32 mmol/L (ref 22–32)
CREATININE: 1.45 mg/dL — AB (ref 0.44–1.00)
Calcium: 9.1 mg/dL (ref 8.9–10.3)
GFR calc non Af Amer: 32 mL/min — ABNORMAL LOW (ref >60)
GFR, EST AFRICAN AMERICAN: 37 mL/min — AB (ref >60)
Glucose, Bld: 187 mg/dL — ABNORMAL HIGH (ref 65–99)
Potassium: 4 mmol/L (ref 3.5–5.1)
SODIUM: 141 mmol/L (ref 135–145)

## 2015-01-16 MED ORDER — FUROSEMIDE 20 MG PO TABS
20.0000 mg | ORAL_TABLET | Freq: Every day | ORAL | Status: DC
  Administered 2015-01-17: 20 mg via ORAL
  Filled 2015-01-16: qty 1

## 2015-01-16 MED ORDER — SODIUM CHLORIDE 0.9 % IV BOLUS (SEPSIS)
500.0000 mL | Freq: Once | INTRAVENOUS | Status: AC
  Administered 2015-01-16: 500 mL via INTRAVENOUS

## 2015-01-16 MED ORDER — INSULIN DETEMIR 100 UNIT/ML ~~LOC~~ SOLN
5.0000 [IU] | Freq: Every day | SUBCUTANEOUS | Status: DC
  Administered 2015-01-16 – 2015-01-17 (×2): 5 [IU] via SUBCUTANEOUS
  Filled 2015-01-16 (×2): qty 0.05

## 2015-01-16 NOTE — Progress Notes (Signed)
[facility] PROGRESS NOTE  Filed Weights   01/13/15 1228 01/14/15 1900 01/16/15 0613  Weight: 77.883 kg (171 lb 11.2 oz) 75.814 kg (167 lb 2.2 oz) 74.925 kg (165 lb 2.9 oz)        Intake/Output Summary (Last 24 hours) at 01/16/15 1134 Last data filed at 01/16/15 0900  Gross per 24 hour  Intake    800 ml  Output    650 ml  Net    150 ml     Assessment/Plan: Essential hypertension, malignant/ Acute on chronic diastolic CHF (congestive heart failure), NYHA class 1 (HCC)/ Acute respiratory failure with hypoxia (HCC)/Uncontrolled hypertension: - Blood pressure has improved. Poor recordings of I's and O's - Hold her IV Lasix and her ACE inhibitor, her creatinine is trending up. - give her a bolus of normal saline and recheck a basic metabolic panel in the morning.  Atrial flutter, paroxysmal ([place]): - She does remain rate controlled - Chads score greater than 2 continue aspirin 325.  Ulcer of right heel [facility]): Consult care.  Obesity (BMI 35.0-39.9 without comorbidity) ([place]): Counseling.  CKD (chronic kidney disease), stage III: At baseline continue to monitor.  Type 2 diabetes mellitus with hyperglycemia ([place]): Blood glucose trending up, continue sliding scale insulin and glimepiride. At low dose Lantus. Check A1c.  Code Status: full Family Communication: daughter  Disposition Plan: inpatient   Consultants:  none  Procedures: ECHO: none  Antibiotics:  None  HPI/Subjective: She relates her breathing is much better.  Objective: Filed Vitals:   01/15/15 1700 01/15/15 2207 01/16/15 0613 01/16/15 0900  BP: 150/80 179/58 155/50 145/55  Pulse:  72 70 82  Temp:  98 F (36.7 C) 98.3 F (36.8 C)   TempSrc:  Oral Oral   Resp:  18 20   Height:      Weight:   74.925 kg (165 lb 2.9 oz)   SpO2: 96% 95% 91% 96%     Exam:  General: Alert, awake, oriented x3, in no acute distress.  HEENT: No bruits, no goiter.  Heart: Regular rate and rhythm, cannot  appreciate JVD Lungs: Good air movement, clear to auscultation. Abdomen: Soft, nontender, nondistended, positive bowel sounds.  Neuro: Grossly intact, nonfocal.   Data Reviewed: Basic Metabolic Panel:  Recent Labs Lab 01/13/15 0926 01/14/15 0407 01/15/15 0233 01/16/15 0210  NA 140 143 141 141  K 3.8 3.4* 4.0 4.0  CL 99* 102 100* 101  CO2 29 30 33* 32  GLUCOSE 234* 186* 280* 187*  BUN 9 13 16  23*  CREATININE 0.98 0.97 1.18* 1.45*  CALCIUM 9.5 9.0 9.1 9.1   Liver Function Tests: No results for input(s): AST, ALT, ALKPHOS, BILITOT, PROT, ALBUMIN in the last 168 hours. No results for input(s): LIPASE, AMYLASE in the last 168 hours. No results for input(s): AMMONIA in the last 168 hours. CBC:  Recent Labs Lab 01/13/15 0926  WBC 7.6  HGB 12.5  HCT 39.8  MCV 89.4  PLT 200   Cardiac Enzymes:  Recent Labs Lab 01/13/15 1545 01/13/15 2158 01/14/15 0407  TROPONINI <0.03 <0.03 <0.03   BNP (last 3 results)  Recent Labs  07/18/14 1216 01/13/15 0926 01/14/15 1040  BNP 250.6* 173.8* 45.0    ProBNP (last 3 results) No results for input(s): PROBNP in the last 8760 hours.  CBG:  Recent Labs Lab 01/15/15 1124 01/15/15 1631 01/15/15 2205 01/16/15 0637 01/16/15 1106  GLUCAP 271* 234* 191* 193* 245*    No results found for this or any  previous visit (from the past 240 hour(s)).   Studies: No results found.  Scheduled Meds: . amLODipine  5 mg Oral Daily  . aspirin EC  325 mg Oral Daily  . atorvastatin  10 mg Oral BH-q7a  . cholecalciferol  2,000 Units Oral BH-q7a  . enoxaparin (LOVENOX) injection  40 mg Subcutaneous Q24H  . [START ON 01/17/2015] furosemide  20 mg Oral Daily  . glimepiride  1 mg Oral BH-q7a  . insulin aspart  0-15 Units Subcutaneous TID WC  . insulin aspart  0-5 Units Subcutaneous QHS  . lisinopril  40 mg Oral QPM  . lithium carbonate  150 mg Oral Daily  . metoprolol  50 mg Oral BID  . mirtazapine  30 mg Oral QPM  . oxybutynin  2.5 mg  Oral BID  . pantoprazole  40 mg Oral Daily  . pregabalin  75 mg Oral BID  . risperiDONE  0.5 mg Oral QPM  . sodium chloride  500 mL Intravenous Once  . sodium chloride  3 mL Intravenous Q12H  . traMADol  50 mg Oral BID  . Vilazodone HCl  20 mg Oral Daily  . zolpidem  5 mg Oral QHS   Continuous Infusions:    Charlynne Cousins  [facility] Pager [phone]  If 7PM-7AM, please contact night-coverage at [email or web address], password [facility] 01/16/2015, 11:34 AM  LOS: 3 days

## 2015-01-17 DIAGNOSIS — N183 Chronic kidney disease, stage 3 (moderate): Secondary | ICD-10-CM | POA: Diagnosis not present

## 2015-01-17 DIAGNOSIS — I5033 Acute on chronic diastolic (congestive) heart failure: Secondary | ICD-10-CM | POA: Diagnosis not present

## 2015-01-17 DIAGNOSIS — J9601 Acute respiratory failure with hypoxia: Secondary | ICD-10-CM | POA: Diagnosis not present

## 2015-01-17 DIAGNOSIS — I10 Essential (primary) hypertension: Secondary | ICD-10-CM | POA: Diagnosis not present

## 2015-01-17 LAB — BASIC METABOLIC PANEL
ANION GAP: 7 (ref 5–15)
BUN: 30 mg/dL — AB (ref 6–20)
CHLORIDE: 101 mmol/L (ref 101–111)
CO2: 33 mmol/L — ABNORMAL HIGH (ref 22–32)
Calcium: 9.1 mg/dL (ref 8.9–10.3)
Creatinine, Ser: 1.43 mg/dL — ABNORMAL HIGH (ref 0.44–1.00)
GFR, EST AFRICAN AMERICAN: 38 mL/min — AB (ref >60)
GFR, EST NON AFRICAN AMERICAN: 33 mL/min — AB (ref >60)
Glucose, Bld: 156 mg/dL — ABNORMAL HIGH (ref 65–99)
POTASSIUM: 4.4 mmol/L (ref 3.5–5.1)
SODIUM: 141 mmol/L (ref 135–145)

## 2015-01-17 LAB — GLUCOSE, CAPILLARY
GLUCOSE-CAPILLARY: 182 mg/dL — AB (ref 65–99)
GLUCOSE-CAPILLARY: 267 mg/dL — AB (ref 65–99)

## 2015-01-17 LAB — HEMOGLOBIN A1C
Hgb A1c MFr Bld: 7.9 % — ABNORMAL HIGH (ref 4.8–5.6)
MEAN PLASMA GLUCOSE: 180 mg/dL

## 2015-01-17 MED ORDER — MAGNESIUM HYDROXIDE 400 MG/5ML PO SUSP: 15.0000 mL | Freq: Every day | ORAL | Status: DC | PRN

## 2015-01-17 MED ORDER — GLIMEPIRIDE 1 MG PO TABS: 2.0000 mg | ORAL_TABLET | ORAL | Status: AC

## 2015-01-17 NOTE — Discharge Summary (Signed)
Physician Discharge Summary  [name] [id number] DOB: 05-Jun-1929 DOA: 01/13/2015  PCP: Irven Shelling, MD  Admit date: 01/13/2015 Discharge date: 01/17/2015  Time spent: 35 minutes  Recommendations for Outpatient Follow-up:  1. Will go home with physical therapy. 2. Continue check CBGs before meals and at bedtime she will take to her primary care doctor 3. Follow-up with PCP in 2-4 weeks.   Discharge Diagnoses:  Active Problems:   Acute on chronic diastolic CHF (congestive heart failure), NYHA class 1 (HCC)   Acute respiratory failure with hypoxia (HCC)   Essential hypertension, malignant   Uncontrolled hypertension   Type 2 diabetes mellitus with hyperglycemia (HCC)   Hyperlipidemia   CKD (chronic kidney disease), stage III   Obesity (BMI 35.0-39.9 without comorbidity) (HCC)   Atrial flutter, paroxysmal (HCC)   Lingular mass   Ulcer of right heel (HCC)   CHF (congestive heart failure) ([place])   Discharge Condition: stable  Diet recommendation: carb modified  Filed Weights   01/14/15 1900 01/16/15 0613 01/17/15 0411  Weight: 75.814 kg (167 lb 2.2 oz) 74.925 kg (165 lb 2.9 oz) 75.8 kg (167 lb 1.7 oz)    History of present illness:  [age]-year-old diabetic on oral hypoglycemic, essential hypertension and chronic disease stage III that comes in to the ED as she was found to be hypoxic and using 4 L of oxygen.  Hospital Course:  Essential hypertension/acute on chronic diastolic heart failure/acute hypoxic respiratory failure with hypoxia/uncontrolled hypertension: She was started on aggressive IV diuresis her ACE inhibitor was titrated up and her blood pressure stabilized  She diureses about 6 L. she will go home on fluid restriction she's been advised to weigh herself every day.   paroxysmal atrial fibrillation: Rate controlled she will continue on aspirin.  Ulcer of the right heel:  Chronic kidney disease stage III: Creatinine remained at  baseline.  Uncontrolled diabetes mellitus: Her hemoglobin A1c was checked which was 7.9 her Amaryl was titrated. She will continue check CBGs before meals and at bedtime and follow-up with her primary care doctor in 2 weeks for further assessment.    Procedures:  CXR  Consultations:  none  Discharge Exam: Filed Vitals:   01/17/15 0411  BP: 148/60  Pulse: 73  Temp: 97.3 F (36.3 C)  Resp: 18    General: A&O x3 Cardiovascular: RRR Respiratory: good air movement CTA B/L  Discharge Instructions   Discharge Instructions    Diet - low sodium heart healthy    Complete by:  As directed      Increase activity slowly    Complete by:  As directed           Current Discharge Medication List    CONTINUE these medications which have CHANGED   Details  glimepiride (AMARYL) 1 MG tablet Take 2 tablets (2 mg total) by mouth every morning. Qty: 30 tablet, Refills: 0      CONTINUE these medications which have NOT CHANGED   Details  amLODipine (NORVASC) 5 MG tablet Take 1 tablet (5 mg total) by mouth daily. Qty: 30 tablet, Refills: 3    aspirin EC 325 MG EC tablet Take 1 tablet (325 mg total) by mouth daily. Qty: 30 tablet, Refills: 3    atorvastatin (LIPITOR) 10 MG tablet Take 5 mg by mouth every morning.     cholecalciferol (VITAMIN D) 1000 UNITS tablet Take 2,000 Units by mouth every morning.    conjugated estrogens (PREMARIN) vaginal cream Place 1 Applicatorful vaginally as needed (  burning).    Cyanocobalamin (B-12 COMPLIANCE INJECTION IJ) Inject as directed every 30 (thirty) days.    furosemide (LASIX) 20 MG tablet Take 2 tablets (40 mg total) by mouth daily. Qty: 30 tablet    lisinopril (PRINIVIL,ZESTRIL) 20 MG tablet Take 1 tablet (20 mg total) by mouth every evening. Qty: 60 tablet, Refills: 0    lithium carbonate (LITHOBID) 300 MG CR tablet Take 150 mg by mouth every morning.    metoprolol (LOPRESSOR) 50 MG tablet Take 1 tablet (50 mg total) by mouth 2 (two)  times daily. Qty: 60 tablet, Refills: 0    mirtazapine (REMERON) 30 MG tablet Take 30 mg by mouth every evening.    nitroGLYCERIN (NITROSTAT) 0.4 MG SL tablet Place 1 tablet (0.4 mg total) under the tongue every 5 (five) minutes as needed for chest pain. Qty: 30 tablet, Refills: 0    omeprazole (PRILOSEC) 40 MG capsule Take 40 mg by mouth every evening.     oxybutynin (DITROPAN) 5 MG tablet Take 5 mg by mouth 2 (two) times daily.     pregabalin (LYRICA) 75 MG capsule Take 75 mg by mouth 2 (two) times daily.    traMADol (ULTRAM) 50 MG tablet Take 1 tablet (50 mg total) by mouth 2 (two) times daily as needed (pain). Qty: 30 tablet, Refills: 0    Vilazodone HCl 20 MG TABS Take 20 mg by mouth every morning.    zolpidem (AMBIEN) 5 MG tablet Take 2.5 mg by mouth at bedtime.       STOP taking these medications     risperiDONE (RISPERDAL) 1 MG tablet        Allergies  Allergen Reactions  . Codeine Other (See Comments)    slurred speech   Follow-up Information    Follow up with [facility].   Why:  They will do your home health care at your home    Contact information:   [address] [place] [address] [phone]       Follow up with Irven Shelling, MD In 2 weeks.   Specialty:  Internal Medicine   Why:  hospital follow up   Contact information:   [address] [place] [address] [phone]        The results of significant diagnostics from this hospitalization (including imaging, microbiology, ancillary and laboratory) are listed below for reference.    Significant Diagnostic Studies: Dg Chest 2 View  01/13/2015  CLINICAL DATA:  Shortness of breath for several days, [place] today with chest heaviness, significant elevated blood pressure on arrival 280/160, stroke last month, personal history of diabetes mellitus, hypertension EXAM: CHEST  2 VIEW COMPARISON:  11/18/2014 FINDINGS: Enlargement of cardiac silhouette  with pulmonary vascular congestion. Atherosclerotic calcification aorta. Bronchitic changes with mild interstitial changes in both lungs slightly increased versus previous exam favor mild pulmonary edema. Lenticular opacity RIGHT mid lung suboptimally visualized on lateral view, could represent pseudotumor from fluid in minor fissure though mass not excluded. No additional pleural effusion or pneumothorax identified. Diffuse osseous demineralization. BILATERAL glenohumeral degenerative changes. IMPRESSION: Enlargement of cardiac silhouette with pulmonary vascular congestion and probable pulmonary edema. Minimal RIGHT basilar atelectasis with lenticular opacity centered at the minor fissure, potentially representing pseudotumor from fluid within minor fissure though mass not entirely excluded. Either CT assessment or radiographic followup until resolution recommended to exclude mass lesion. Electronically Signed   By: Lavonia Dana M.D.   On: 01/13/2015 10:14   Ct Chest Wo  Contrast  01/13/2015  CLINICAL DATA:  81 year old diabetic hypertensive female with chronic kidney disease and chest pain with shortness breath for days. Subsequent encounter. EXAM: CT CHEST WITHOUT CONTRAST TECHNIQUE: Multidetector CT imaging of the chest was performed following the standard protocol without IV contrast. COMPARISON:  01/13/2015 and 11/18/2014 chest x-ray. 04/19/2003 chest CT. FINDINGS: Patchy parenchymal changes right upper lobe within right middle lobe. Septal thickening. Oblong opacity within the minor fissure corresponding to chest x-ray abnormality. Tiny right pleural effusion/ pleural thickening. Mediastinal adenopathy most notable pretracheal region. Findings may reflect result of congestive heart failure with fluid in the minor fissure. Infection cannot be excluded if of high clinical concern. Recommend treating any clinically suspected congestive heart failure with follow-up chest x-ray in the next 4 weeks to confirm  that findings clear as expected for benign process. The minor fissure abnormality would be unusual for a primary lung cancer (developed in a short period time when compared to 11/18/2014). Heart size top-normal. Minimal pericardial fluid. Calcification mitral and aortic valve. Coronary artery calcifications. Calcification thoracic aorta with slight ectasia. Calcification left subclavian artery. Minimal degenerative changes throughout the thoracic spine without compression fracture. Unenhanced visualized upper abdominal structures unremarkable. IMPRESSION: Patchy parenchymal changes right upper lobe within right middle lobe. Septal thickening. Oblong opacity within the minor fissure corresponding to chest x-ray abnormality. Tiny right pleural effusion/ pleural thickening. Mediastinal adenopathy most notable pretracheal region. Findings most suggestive of result of congestive heart failure with fluid in the minor fissure. Infection could not be excluded if of high clinical concern. Recommend treating any clinically suspected congestive heart failure with follow-up chest x-ray in the next 4 weeks. Electronically Signed   By: Genia Del M.D.   On: 01/13/2015 15:24   Dg Chest Port 1 View  01/14/2015  CLINICAL DATA:  CHF and shortness of breath EXAM: PORTABLE CHEST 1 VIEW COMPARISON:  Yesterday FINDINGS: Chronic cardiomegaly. Stable aortic and hilar contours. Re- demonstrated ovoid opacity along the right minor fissure, likely trapped fluid. Persistent diffuse interstitial coarsening which has the appearance of pulmonary edema on prior chest CT. No effusion. No air leak. IMPRESSION: 1. Stable mild CHF. 2. Stable opacity at the minor fissure, likely fissural fluid based on recent CT. Electronically Signed   By: Monte Fantasia M.D.   On: 01/14/2015 06:39    Microbiology: No results found for this or any previous visit (from the past 240 hour(s)).   Labs: Basic Metabolic Panel:  Recent Labs Lab  01/13/15 0926 01/14/15 0407 01/15/15 0233 01/16/15 0210 01/17/15 0217  NA 140 143 141 141 141  K 3.8 3.4* 4.0 4.0 4.4  CL 99* 102 100* 101 101  CO2 29 30 33* 32 33*  GLUCOSE 234* 186* 280* 187* 156*  BUN 9 13 16  23* 30*  CREATININE 0.98 0.97 1.18* 1.45* 1.43*  CALCIUM 9.5 9.0 9.1 9.1 9.1   Liver Function Tests: No results for input(s): AST, ALT, ALKPHOS, BILITOT, PROT, ALBUMIN in the last 168 hours. No results for input(s): LIPASE, AMYLASE in the last 168 hours. No results for input(s): AMMONIA in the last 168 hours. CBC:  Recent Labs Lab 01/13/15 0926  WBC 7.6  HGB 12.5  HCT 39.8  MCV 89.4  PLT 200   Cardiac Enzymes:  Recent Labs Lab 01/13/15 1545 01/13/15 2158 01/14/15 0407  TROPONINI <0.03 <0.03 <0.03   BNP: BNP (last 3 results)  Recent Labs  07/18/14 1216 01/13/15 0926 01/14/15 1040  BNP 250.6* 173.8* 45.0    ProBNP (last  3 results) No results for input(s): PROBNP in the last 8760 hours.  CBG:  Recent Labs Lab 01/16/15 1106 01/16/15 1603 01/16/15 2056 01/17/15 0604 01/17/15 1143  GLUCAP 245* 116* 171* 182* 267*       Signed:  Charlynne Cousins  [facility] 01/17/2015, 12:43 PM

## 2015-01-17 NOTE — Progress Notes (Signed)
Discharge instructions given. Pt verbalized understanding and all questions were answered.  

## 2015-01-17 NOTE — Consult Note (Signed)
WOC follow-up: Consult requested for right heel wound.  This was performed on 11/10; please refer to previous progress notes for assessment and plan of care. Please re-consult if further assistance is needed.  Thank-you,  Julien Girt MSN, [place], [place], [place], [place]

## 2015-01-17 NOTE — Progress Notes (Signed)
Physical Therapy Treatment Patient Details Name: [name] MRN: [id number] DOB: 05-07-1929 Today's Date: 01/17/2015    History of Present Illness 81 yo female with onset of chest pain, SOB and CHF was admitted with acute respiratory failure and has R heel ulcer.    PT Comments    Pt was given assistance for OOB and to control balance but is headed home today.  Her family will need to assist her with scooting out of bed, to control safety with gait and for use of BR.  Have informed SW of same, have HHPT planned for her assistance of mobility.  Pt is comfortable with this plan.  Follow Up Recommendations  Home health PT;Supervision/Assistance - 24 hour     Equipment Recommendations  None recommended by PT    Recommendations for Other Services Rehab consult     Precautions / Restrictions Precautions Precautions: Fall Precaution Comments: telemetry Restrictions Weight Bearing Restrictions: No    Mobility  Bed Mobility Overal bed mobility: Needs Assistance Bed Mobility: Supine to Sit     Supine to sit: Min assist (helped her to bedside under trunk and with LE's on bed pad)        Transfers Overall transfer level: Needs assistance Equipment used: Rolling walker (2 wheeled);1 person hand held assist Transfers: Sit to/from [facility] Sit to Stand: Min guard Stand pivot transfers: Min guard       General transfer comment: cued safety to set up turns and not impulsively sit quickly  Ambulation/Gait Ambulation/Gait assistance: Min guard (Pt needs cues to stand up and control LE's) Ambulation Distance (Feet): 50 Feet Assistive device: Rolling walker (2 wheeled);1 person hand held assist Gait Pattern/deviations: Step-through pattern;Trunk flexed;Wide base of support (pushes walker away at times) Gait velocity: slow and careful Gait velocity interpretation: Below normal speed for age/gender General Gait Details: reminders for direction of  walker   Stairs            Wheelchair Mobility    Modified Rankin (Stroke Patients Only)       Balance Overall balance assessment: Needs assistance Sitting-balance support: Feet supported Sitting balance-Leahy Scale: Good   Postural control: Posterior lean Standing balance support: Bilateral upper extremity supported Standing balance-Leahy Scale: Fair Standing balance comment: less than fair with dynamic work                    Cognition Arousal/Alertness: Awake/alert Behavior During Therapy: WFL for tasks assessed/performed Overall Cognitive Status: Within Functional Limits for tasks assessed                      Exercises General Exercises - Lower Extremity Ankle Circles/Pumps: AAROM;Both;5 reps Quad Sets: AROM;Both;10 reps Hip ABduction/ADduction: AAROM;Both;10 reps    General Comments General comments (skin integrity, edema, etc.): Pt is getting up to walk with less control but after BSC was more controlled and better awareness of safety, following cues well to correct posture and direction      Pertinent Vitals/Pain Pain Assessment: No/denies pain    Home Living                      Prior Function            PT Goals (current goals can now be found in the care plan section) Acute Rehab PT Goals Patient Stated Goal: wants to go home Progress towards PT goals: Progressing toward goals    Frequency  Min 3X/week  PT Plan Current plan remains appropriate    Co-evaluation             End of Session Equipment Utilized During Treatment: Gait belt Activity Tolerance: Patient tolerated treatment well;Patient limited by fatigue Patient left: in chair;with call bell/phone within reach;with chair alarm set     Time: [id number] PT Time Calculation (min) (ACUTE ONLY): 28 min  Charges:  $Gait Training: 8-22 mins $Therapeutic Exercise: 8-22 mins                    G Codes:      Ramond Dial 2015/02/09, 1:35 PM    Mee Hives, PT MS Acute Rehab Dept. Number: [phone] [phone] and [place] [phone]

## 2015-01-18 ENCOUNTER — Ambulatory Visit: Payer: Medicare Other | Admitting: *Deleted

## 2015-02-03 ENCOUNTER — Emergency Department (HOSPITAL_COMMUNITY): Payer: Medicare Other

## 2015-02-03 ENCOUNTER — Encounter (HOSPITAL_COMMUNITY): Payer: Self-pay

## 2015-02-03 ENCOUNTER — Inpatient Hospital Stay (HOSPITAL_COMMUNITY)
Admission: EM | Admit: 2015-02-03 | Discharge: 2015-02-09 | DRG: 871 | Disposition: A | Discharge status: Home Health | Payer: Medicare Other | Attending: Internal Medicine | Admitting: Internal Medicine

## 2015-02-03 DIAGNOSIS — R109 Unspecified abdominal pain: Secondary | ICD-10-CM

## 2015-02-03 DIAGNOSIS — F319 Bipolar disorder, unspecified: Secondary | ICD-10-CM | POA: Diagnosis present

## 2015-02-03 DIAGNOSIS — Z79899 Other long term (current) drug therapy: Secondary | ICD-10-CM | POA: Diagnosis not present

## 2015-02-03 DIAGNOSIS — K862 Cyst of pancreas: Secondary | ICD-10-CM | POA: Diagnosis present

## 2015-02-03 DIAGNOSIS — I10 Essential (primary) hypertension: Secondary | ICD-10-CM | POA: Diagnosis present

## 2015-02-03 DIAGNOSIS — I129 Hypertensive chronic kidney disease with stage 1 through stage 4 chronic kidney disease, or unspecified chronic kidney disease: Secondary | ICD-10-CM | POA: Diagnosis present

## 2015-02-03 DIAGNOSIS — E872 Acidosis, unspecified: Secondary | ICD-10-CM

## 2015-02-03 DIAGNOSIS — K8050 Calculus of bile duct without cholangitis or cholecystitis without obstruction: Secondary | ICD-10-CM

## 2015-02-03 DIAGNOSIS — I69354 Hemiplegia and hemiparesis following cerebral infarction affecting left non-dominant side: Secondary | ICD-10-CM

## 2015-02-03 DIAGNOSIS — K8309 Other cholangitis: Secondary | ICD-10-CM

## 2015-02-03 DIAGNOSIS — R112 Nausea with vomiting, unspecified: Secondary | ICD-10-CM

## 2015-02-03 DIAGNOSIS — I5033 Acute on chronic diastolic (congestive) heart failure: Secondary | ICD-10-CM | POA: Diagnosis not present

## 2015-02-03 DIAGNOSIS — A419 Sepsis, unspecified organism: Secondary | ICD-10-CM | POA: Diagnosis not present

## 2015-02-03 DIAGNOSIS — A4189 Other specified sepsis: Secondary | ICD-10-CM | POA: Diagnosis present

## 2015-02-03 DIAGNOSIS — E785 Hyperlipidemia, unspecified: Secondary | ICD-10-CM | POA: Diagnosis present

## 2015-02-03 DIAGNOSIS — H3530 Unspecified macular degeneration: Secondary | ICD-10-CM | POA: Diagnosis present

## 2015-02-03 DIAGNOSIS — E1165 Type 2 diabetes mellitus with hyperglycemia: Secondary | ICD-10-CM | POA: Diagnosis present

## 2015-02-03 DIAGNOSIS — Z7984 Long term (current) use of oral hypoglycemic drugs: Secondary | ICD-10-CM | POA: Diagnosis not present

## 2015-02-03 DIAGNOSIS — K831 Obstruction of bile duct: Secondary | ICD-10-CM | POA: Diagnosis present

## 2015-02-03 DIAGNOSIS — K830 Cholangitis: Secondary | ICD-10-CM | POA: Diagnosis present

## 2015-02-03 DIAGNOSIS — Z7982 Long term (current) use of aspirin: Secondary | ICD-10-CM

## 2015-02-03 DIAGNOSIS — R945 Abnormal results of liver function studies: Secondary | ICD-10-CM

## 2015-02-03 DIAGNOSIS — E1122 Type 2 diabetes mellitus with diabetic chronic kidney disease: Secondary | ICD-10-CM | POA: Diagnosis present

## 2015-02-03 DIAGNOSIS — K5710 Diverticulosis of small intestine without perforation or abscess without bleeding: Secondary | ICD-10-CM | POA: Diagnosis present

## 2015-02-03 DIAGNOSIS — Z66 Do not resuscitate: Secondary | ICD-10-CM | POA: Diagnosis present

## 2015-02-03 DIAGNOSIS — R7989 Other specified abnormal findings of blood chemistry: Secondary | ICD-10-CM

## 2015-02-03 DIAGNOSIS — R5381 Other malaise: Secondary | ICD-10-CM | POA: Diagnosis present

## 2015-02-03 DIAGNOSIS — I5032 Chronic diastolic (congestive) heart failure: Secondary | ICD-10-CM | POA: Diagnosis present

## 2015-02-03 DIAGNOSIS — K219 Gastro-esophageal reflux disease without esophagitis: Secondary | ICD-10-CM | POA: Diagnosis present

## 2015-02-03 DIAGNOSIS — R509 Fever, unspecified: Secondary | ICD-10-CM

## 2015-02-03 DIAGNOSIS — N183 Chronic kidney disease, stage 3 (moderate): Secondary | ICD-10-CM | POA: Diagnosis present

## 2015-02-03 DIAGNOSIS — I69320 Aphasia following cerebral infarction: Secondary | ICD-10-CM | POA: Diagnosis not present

## 2015-02-03 DIAGNOSIS — K819 Cholecystitis, unspecified: Secondary | ICD-10-CM | POA: Diagnosis present

## 2015-02-03 DIAGNOSIS — R0600 Dyspnea, unspecified: Secondary | ICD-10-CM

## 2015-02-03 LAB — URINALYSIS, ROUTINE W REFLEX MICROSCOPIC
Bilirubin Urine: NEGATIVE
Glucose, UA: 250 mg/dL — AB
Ketones, ur: NEGATIVE mg/dL
Leukocytes, UA: NEGATIVE
Nitrite: NEGATIVE
Protein, ur: 100 mg/dL — AB
Specific Gravity, Urine: 1.013 (ref 1.005–1.030)
pH: 5.5 (ref 5.0–8.0)

## 2015-02-03 LAB — CBC WITH DIFFERENTIAL/PLATELET
Basophils Absolute: 0 K/uL (ref 0.0–0.1)
Basophils Relative: 0 %
Eosinophils Absolute: 0 K/uL (ref 0.0–0.7)
Eosinophils Relative: 0 %
HCT: 42.2 % (ref 36.0–46.0)
Hemoglobin: 13.3 g/dL (ref 12.0–15.0)
Lymphocytes Relative: 4 %
Lymphs Abs: 0.3 K/uL — ABNORMAL LOW (ref 0.7–4.0)
MCH: 28.6 pg (ref 26.0–34.0)
MCHC: 31.5 g/dL (ref 30.0–36.0)
MCV: 90.8 fL (ref 78.0–100.0)
Monocytes Absolute: 0.1 K/uL (ref 0.1–1.0)
Monocytes Relative: 1 %
Neutro Abs: 7.9 K/uL — ABNORMAL HIGH (ref 1.7–7.7)
Neutrophils Relative %: 95 %
Platelets: 199 K/uL (ref 150–400)
RBC: 4.65 MIL/uL (ref 3.87–5.11)
RDW: 13.2 % (ref 11.5–15.5)
WBC: 8.3 K/uL (ref 4.0–10.5)

## 2015-02-03 LAB — I-STAT CG4 LACTIC ACID, ED
Lactic Acid, Venous: 6.83 mmol/L (ref 0.5–2.0)
Lactic Acid, Venous: 2.59 mmol/L (ref 0.5–2.0)

## 2015-02-03 LAB — COMPREHENSIVE METABOLIC PANEL
ALT: 136 U/L — ABNORMAL HIGH (ref 14–54)
AST: 441 U/L — ABNORMAL HIGH (ref 15–41)
Albumin: 3.5 g/dL (ref 3.5–5.0)
Alkaline Phosphatase: 233 U/L — ABNORMAL HIGH (ref 38–126)
Anion gap: 18 — ABNORMAL HIGH (ref 5–15)
BUN: 11 mg/dL (ref 6–20)
CO2: 20 mmol/L — ABNORMAL LOW (ref 22–32)
Calcium: 9.3 mg/dL (ref 8.9–10.3)
Chloride: 105 mmol/L (ref 101–111)
Creatinine, Ser: 1.19 mg/dL — ABNORMAL HIGH (ref 0.44–1.00)
GFR calc Af Amer: 48 mL/min — ABNORMAL LOW (ref >60)
GFR calc non Af Amer: 41 mL/min — ABNORMAL LOW (ref >60)
Glucose, Bld: 261 mg/dL — ABNORMAL HIGH (ref 65–99)
Potassium: 3.6 mmol/L (ref 3.5–5.1)
Sodium: 143 mmol/L (ref 135–145)
Total Bilirubin: 3 mg/dL — ABNORMAL HIGH (ref 0.3–1.2)
Total Protein: 6.6 g/dL (ref 6.5–8.1)

## 2015-02-03 LAB — URINE MICROSCOPIC-ADD ON

## 2015-02-03 LAB — TROPONIN I: Troponin I: <0.03 ng/mL (ref <0.031)

## 2015-02-03 LAB — GLUCOSE, CAPILLARY: Glucose-Capillary: 261 mg/dL — ABNORMAL HIGH (ref 65–99)

## 2015-02-03 LAB — LIPASE, BLOOD: Lipase: 32 U/L (ref 11–51)

## 2015-02-03 MED ORDER — PANTOPRAZOLE SODIUM 40 MG PO TBEC
80.0000 mg | DELAYED_RELEASE_TABLET | Freq: Every day | ORAL | Status: DC
  Administered 2015-02-03 – 2015-02-09 (×7): 80 mg via ORAL
  Filled 2015-02-03 (×7): qty 2

## 2015-02-03 MED ORDER — ASPIRIN EC 325 MG PO TBEC
325.0000 mg | DELAYED_RELEASE_TABLET | Freq: Every day | ORAL | Status: DC
  Administered 2015-02-03 – 2015-02-09 (×7): 325 mg via ORAL
  Filled 2015-02-03 (×7): qty 1

## 2015-02-03 MED ORDER — VILAZODONE HCL 20 MG PO TABS
20.0000 mg | ORAL_TABLET | Freq: Every day | ORAL | Status: DC
  Administered 2015-02-04 – 2015-02-09 (×6): 20 mg via ORAL
  Filled 2015-02-03 (×7): qty 1

## 2015-02-03 MED ORDER — ONDANSETRON HCL 4 MG/2ML IJ SOLN
4.0000 mg | Freq: Once | INTRAMUSCULAR | Status: AC
  Administered 2015-02-03: 4 mg via INTRAVENOUS
  Filled 2015-02-03: qty 2

## 2015-02-03 MED ORDER — LISINOPRIL 20 MG PO TABS
20.0000 mg | ORAL_TABLET | Freq: Every evening | ORAL | Status: DC
  Administered 2015-02-03: 20 mg via ORAL
  Filled 2015-02-03: qty 1

## 2015-02-03 MED ORDER — SODIUM CHLORIDE 0.9 % IV SOLN
INTRAVENOUS | Status: DC
  Administered 2015-02-03: 14:00:00 via INTRAVENOUS

## 2015-02-03 MED ORDER — AMLODIPINE BESYLATE 5 MG PO TABS
5.0000 mg | ORAL_TABLET | Freq: Every day | ORAL | Status: DC
  Administered 2015-02-03 – 2015-02-09 (×7): 5 mg via ORAL
  Filled 2015-02-03 (×7): qty 1

## 2015-02-03 MED ORDER — HEPARIN SODIUM (PORCINE) 5000 UNIT/ML IJ SOLN
5000.0000 [IU] | Freq: Three times a day (TID) | INTRAMUSCULAR | Status: DC
  Administered 2015-02-03 – 2015-02-09 (×17): 5000 [IU] via SUBCUTANEOUS
  Filled 2015-02-03 (×12): qty 1

## 2015-02-03 MED ORDER — ONDANSETRON HCL 4 MG/2ML IJ SOLN
4.0000 mg | Freq: Four times a day (QID) | INTRAMUSCULAR | Status: DC | PRN
  Administered 2015-02-07: 4 mg via INTRAVENOUS
  Filled 2015-02-03: qty 2

## 2015-02-03 MED ORDER — METOPROLOL TARTRATE 50 MG PO TABS
50.0000 mg | ORAL_TABLET | Freq: Two times a day (BID) | ORAL | Status: DC
  Administered 2015-02-03 – 2015-02-09 (×12): 50 mg via ORAL
  Filled 2015-02-03 (×12): qty 1

## 2015-02-03 MED ORDER — ATORVASTATIN CALCIUM 10 MG PO TABS
5.0000 mg | ORAL_TABLET | Freq: Every day | ORAL | Status: DC
  Administered 2015-02-04 – 2015-02-08 (×5): 5 mg via ORAL
  Filled 2015-02-03 (×7): qty 1

## 2015-02-03 MED ORDER — PIPERACILLIN-TAZOBACTAM 3.375 G IVPB 30 MIN
3.3750 g | Freq: Once | INTRAVENOUS | Status: AC
  Administered 2015-02-03: 3.375 g via INTRAVENOUS
  Filled 2015-02-03: qty 50

## 2015-02-03 MED ORDER — ACETAMINOPHEN 650 MG RE SUPP
650.0000 mg | Freq: Once | RECTAL | Status: AC
  Administered 2015-02-03: 650 mg via RECTAL
  Filled 2015-02-03: qty 1

## 2015-02-03 MED ORDER — MORPHINE SULFATE (PF) 2 MG/ML IV SOLN: 2.0000 mg | INTRAVENOUS | Status: DC | PRN

## 2015-02-03 MED ORDER — OXYBUTYNIN CHLORIDE 5 MG PO TABS
5.0000 mg | ORAL_TABLET | Freq: Two times a day (BID) | ORAL | Status: DC
  Administered 2015-02-03 – 2015-02-09 (×12): 5 mg via ORAL
  Filled 2015-02-03 (×12): qty 1

## 2015-02-03 MED ORDER — PREGABALIN 75 MG PO CAPS
75.0000 mg | ORAL_CAPSULE | Freq: Two times a day (BID) | ORAL | Status: DC
  Administered 2015-02-03 – 2015-02-09 (×12): 75 mg via ORAL
  Filled 2015-02-03: qty 3
  Filled 2015-02-03 (×3): qty 1
  Filled 2015-02-03: qty 3
  Filled 2015-02-03 (×4): qty 1
  Filled 2015-02-03: qty 3
  Filled 2015-02-03: qty 1
  Filled 2015-02-03: qty 3

## 2015-02-03 MED ORDER — ZOLPIDEM TARTRATE 5 MG PO TABS
2.5000 mg | ORAL_TABLET | Freq: Every day | ORAL | Status: DC
  Administered 2015-02-03 – 2015-02-08 (×6): 2.5 mg via ORAL
  Filled 2015-02-03 (×6): qty 1

## 2015-02-03 MED ORDER — ACETAMINOPHEN 650 MG RE SUPP: 650.0000 mg | Freq: Four times a day (QID) | RECTAL | Status: DC | PRN

## 2015-02-03 MED ORDER — IOHEXOL 300 MG/ML  SOLN
60.0000 mL | Freq: Once | INTRAMUSCULAR | Status: AC | PRN
  Administered 2015-02-03: 60 mL via INTRAVENOUS

## 2015-02-03 MED ORDER — ACETAMINOPHEN 325 MG PO TABS: 650.0000 mg | ORAL_TABLET | Freq: Four times a day (QID) | ORAL | Status: DC | PRN

## 2015-02-03 MED ORDER — VANCOMYCIN HCL IN DEXTROSE 1-5 GM/200ML-% IV SOLN: 1000.0000 mg | INTRAVENOUS | Status: DC

## 2015-02-03 MED ORDER — VANCOMYCIN HCL 10 G IV SOLR
1500.0000 mg | INTRAVENOUS | Status: AC
  Administered 2015-02-03: 1500 mg via INTRAVENOUS
  Filled 2015-02-03: qty 1500

## 2015-02-03 MED ORDER — INSULIN ASPART 100 UNIT/ML ~~LOC~~ SOLN
0.0000 [IU] | SUBCUTANEOUS | Status: DC
  Administered 2015-02-03: 5 [IU] via SUBCUTANEOUS
  Administered 2015-02-04 (×2): 2 [IU] via SUBCUTANEOUS
  Administered 2015-02-04: 5 [IU] via SUBCUTANEOUS
  Administered 2015-02-04: 2 [IU] via SUBCUTANEOUS
  Administered 2015-02-05: 1 [IU] via SUBCUTANEOUS
  Administered 2015-02-05: 2 [IU] via SUBCUTANEOUS
  Administered 2015-02-05: 3 [IU] via SUBCUTANEOUS
  Administered 2015-02-06: 2 [IU] via SUBCUTANEOUS
  Administered 2015-02-06: 3 [IU] via SUBCUTANEOUS
  Administered 2015-02-06 (×2): 2 [IU] via SUBCUTANEOUS
  Administered 2015-02-06: 3 [IU] via SUBCUTANEOUS
  Administered 2015-02-06: 5 [IU] via SUBCUTANEOUS
  Administered 2015-02-07: 3 [IU] via SUBCUTANEOUS
  Administered 2015-02-07: 7 [IU] via SUBCUTANEOUS
  Administered 2015-02-07 (×3): 2 [IU] via SUBCUTANEOUS
  Administered 2015-02-07: 5 [IU] via SUBCUTANEOUS
  Administered 2015-02-08: 3 [IU] via SUBCUTANEOUS
  Administered 2015-02-08 (×3): 2 [IU] via SUBCUTANEOUS
  Administered 2015-02-08: 3 [IU] via SUBCUTANEOUS
  Administered 2015-02-08 – 2015-02-09 (×3): 2 [IU] via SUBCUTANEOUS
  Administered 2015-02-09: 1 [IU] via SUBCUTANEOUS

## 2015-02-03 MED ORDER — MIRTAZAPINE 15 MG PO TABS
30.0000 mg | ORAL_TABLET | Freq: Every evening | ORAL | Status: DC
  Administered 2015-02-03 – 2015-02-08 (×6): 30 mg via ORAL
  Filled 2015-02-03: qty 2
  Filled 2015-02-03 (×2): qty 1
  Filled 2015-02-03 (×3): qty 2
  Filled 2015-02-03: qty 1
  Filled 2015-02-03: qty 2

## 2015-02-03 MED ORDER — SODIUM CHLORIDE 0.9 % IV SOLN
INTRAVENOUS | Status: DC
  Administered 2015-02-03 – 2015-02-04 (×2): via INTRAVENOUS

## 2015-02-03 NOTE — ED Notes (Signed)
Iv attempted x 2 without success. 

## 2015-02-03 NOTE — ED Notes (Signed)
Pt arrives EMS with c/o vomiting dark emesis today and dark stools over last several days. Pt eating prunes today. Temp noted as 104.4 oral on arrival.

## 2015-02-03 NOTE — ED Notes (Signed)
IV team at bedside attempting 2nd IV access 

## 2015-02-03 NOTE — ED Notes (Signed)
Wilson Singer, MD notified of abnormal lab test results

## 2015-02-03 NOTE — ED Provider Notes (Signed)
Received care of patient at 4:30PM from Dr. Wilson Singer.  Please see his note for history, physical and prior care. Briefly this is an 81 year old female with a history of hypertension, diabetes, hyperlipidemia, diastolic heart failure, CKD, TIAs, atrial flutter paroxysmal who presents with concern of abdominal pain nausea and vomiting beginning today with fever on arrival to the emergency department. Patient had elevation in transaminases, lipase was added on. CT abdomen and pelvis and urinalysis are pending.  CT concerning for mildly enlarged cystic pancreatic head mass, with intrahepatic and extrahepatic biliary dilation with abrupt narrowing at the pancreatic head, mild pericholecystic fluid.  Concern is for cholangitis. Called surgery given concern of gallbladder inflammation, as well as gastroenterology. Pt has received vanc/zosyn.  Lactic acid is trending down. Dr. Alcario Drought, hospitalist to admit pt to Step Down. GI and surgery aware.  Pt with remaining hemodynamic stability at this time.    CT Abdomen Pelvis W Contrast (Final result) Result time: 02/03/15 17:03:14   Final result by Rad Results In Interface (02/03/15 17:03:14)   Narrative:   CLINICAL DATA: Fever, abdominal pain, abnormal LFTs  EXAM: CT ABDOMEN AND PELVIS WITH CONTRAST  TECHNIQUE: Multidetector CT imaging of the abdomen and pelvis was performed using the standard protocol following bolus administration of intravenous contrast.  CONTRAST: 69mL OMNIPAQUE IOHEXOL 300 MG/ML SOLN  COMPARISON: 03/23/2010  FINDINGS: Lower chest: Mild bibasilar chronic interstitial disease.  Hepatobiliary: No focal hepatic mass. Intrahepatic biliary ductal dilatation. Dilated common bile duct to the level of the pancreatic head. Mild pericholecystic fluid or wall thickening.  Pancreas: 2.8 X 2.4 cm cystic pancreatic head mass with a punctate mural calcification.  Spleen: Normal.  Adrenals/Urinary Tract: Normal adrenal glands. 10 mm  hypodense, fluid attenuating left renal mass most consistent with a cyst. Normal right kidney. No obstructive uropathy or urolithiasis. Unremarkable bladder.  Stomach/Bowel: Duo dual diverticulum is noted. No bowel wall thickening or dilatation. Diverticulosis of the descending and sigmoid colon without evidence of diverticulitis. Rectal fecal impaction. No pneumatosis, pneumoperitoneum or portal venous gas. No abdominal or pelvic free fluid. Small fat containing umbilical hernia.  Vascular/Lymphatic: Normal caliber abdominal aorta with atherosclerosis. No abdominal or pelvic lymphadenopathy.  Reproductive: Normal uterus. No adnexal mass.  Other: No fluid collection or hematoma.  Musculoskeletal: No acute osseous abnormality. No aggressive lytic or sclerotic osseous lesion. Left hip arthroplasty. Degenerative disc disease throughout the lumbar spine with diffuse facet arthropathy.  IMPRESSION: 1. No acute abdominal or pelvic pathology. 2. 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 3. Intrahepatic and extrahepatic biliary ductal dilatation with abrupt narrowing at the pancreatic head. No focal ampullary mass or choledocholithiasis is identified. 4. Mild pericholecystic fluid or wall thickening. This appearance can be seen with cholecystitis. If there is further clinical concern, recommend a right upper quadrant ultrasound.   Electronically Signed By: Kathreen Devoid On: 02/03/2015 17:03          DG Chest Portable 1 View (Final result) Result time: 02/03/15 14:18:10   Final result by Rad Results In Interface (02/03/15 14:18:10)   Narrative:   CLINICAL DATA: Fever  EXAM: PORTABLE CHEST 1 VIEW  COMPARISON: 01/14/2015  FINDINGS: Heart size mildly enlarged. Mild vascular congestion. Negative for edema or effusion. Prominent lung markings are similar to  prior studies consistent with mild scarring.  IMPRESSION: Mild congestive heart failure without significant edema or effusion. No definite pneumonia.  Gareth Morgan, MD 02/04/15 [phone]

## 2015-02-03 NOTE — Progress Notes (Addendum)
ANTIBIOTIC CONSULT NOTE - INITIAL  Pharmacy Consult for vancomycin Indication: rule out sepsis  Allergies  Allergen Reactions  . Codeine Other (See Comments)    slurred speech    Patient Measurements: Height: 5\' 3"  (160 cm) Weight: 165 lb (74.844 kg) IBW/kg (Calculated) : 52.4 Adjusted Body Weight:   Vital Signs: Temp: 104.4 F (40.2 C) (12/01 1324) Temp Source: Oral (12/01 1324) BP: 205/63 mmHg (12/01 1430) Pulse Rate: 100 (12/01 1430) Intake/Output from previous day:   Intake/Output from this shift:    Labs:  Recent Labs  02/03/15 1335  WBC 8.3  HGB 13.3  PLT 199  CREATININE 1.19*   Estimated Creatinine Clearance: 34.7 mL/min (by C-G formula based on Cr of 1.19). No results for input(s): VANCOTROUGH, VANCOPEAK, VANCORANDOM, GENTTROUGH, GENTPEAK, GENTRANDOM, TOBRATROUGH, TOBRAPEAK, TOBRARND, AMIKACINPEAK, AMIKACINTROU, AMIKACIN in the last 72 hours.   Microbiology: No results found for this or any previous visit (from the past 720 hour(s)).  Medical History: Past Medical History  Diagnosis Date  . Diabetes mellitus without complication ([place])   . Hypertension   . CKD (chronic kidney disease), stage III   . PVC (premature ventricular contraction)   . Hyperlipidemia   . GERD (gastroesophageal reflux disease)   . Osteopenia   . Chronic low back pain   . Postlaminectomy syndrome   . Right carpal tunnel syndrome   . Pernicious anemia   . Chronic interstitial cystitis   . Microhematuria   . Macular degeneration   . Chest pain     a. 07/2014 Neg MV, nl EF.    Medications:  Anti-infectives    Start     Dose/Rate Route Frequency Ordered Stop   02/04/15 1430  vancomycin (VANCOCIN) IVPB 1000 mg/200 mL premix     1,000 mg 200 mL/hr over 60 Minutes Intravenous Every 24 hours 02/03/15 1444     02/03/15 1400  vancomycin (VANCOCIN) 1,500 mg in sodium chloride 0.9 % 500 mL IVPB     1,500 mg 250 mL/hr over 120 Minutes Intravenous STAT 02/03/15 1349 02/04/15 1400    02/03/15 1345  piperacillin-tazobactam (ZOSYN) IVPB 3.375 g     3.375 g 100 mL/hr over 30 Minutes Intravenous  Once 02/03/15 1334 02/03/15 1445     Assessment: 32 yof presented to the ED with vomiting, abdominal pain and chest pain. To start empiric broad-spectrum antibiotics with vancomycin. She also received a 1x dose of zosyn. Tmax of 14.4 and WBC is WNL. Lactic acid is elevated at 6.83, Scr is 1.19.  Vanc 12/1>> Zosyn x 1 12/1  Goal of Therapy:  Vancomycin trough level 15-20 mcg/ml  Plan:  - Vancomycin 1500mg  IV x 1 then 1gm IV Q24H - F/u renal fxn, C&S, clinical status and trough at [facility] - F/u continuation of zosyn or other gram negative coverage  Rumbarger, Rande Lawman 02/03/2015,2:45 PM  ADDN: Pharmacy is consulted to dose zosyn for suspected intra-abdominal infection ascending cholangitis.   Plan: Stop vancomycin Start zosyn 3.375g IV q8h  Andrey Cota. Diona Foley, PharmD, [place] Clinical Pharmacist Pager [phone]

## 2015-02-03 NOTE — ED Provider Notes (Signed)
CSN: [id number]     Arrival date & time 02/03/15  1316 History   First MD Initiated Contact with Patient 02/03/15 1319     Chief Complaint  Patient presents with  . Chest Pain  . Emesis     (Consider location/radiation/quality/duration/timing/severity/associated sxs/prior Treatment) HPI   81 year old female presenting for evaluation of abdominal pain and vomiting. Onset earlier today. History is from daughter at bedside. Patient is essentially nonverbal at this point. Apparently she woke up in her usual state of health earlier today. Just prior to arrival she being complaining of epigastric pain to her daughter and began vomiting. She did not voice any specific complaints recently otherwise.   Past Medical History  Diagnosis Date  . Diabetes mellitus without complication ([place])   . Hypertension   . CKD (chronic kidney disease), stage III   . PVC (premature ventricular contraction)   . Hyperlipidemia   . GERD (gastroesophageal reflux disease)   . Osteopenia   . Chronic low back pain   . Postlaminectomy syndrome   . Right carpal tunnel syndrome   . Pernicious anemia   . Chronic interstitial cystitis   . Microhematuria   . Macular degeneration   . Chest pain     a. 07/2014 Neg MV, nl EF.   No past surgical history on file. Family History  Problem Relation Age of Onset  . Arthritis Mother   . Heart attack Father    Social History  Substance Use Topics  . Smoking status: Never Smoker   . Smokeless tobacco: Never Used  . Alcohol Use: No   OB History    No data available     Review of Systems  Level V caveat because patient is nonverbal.   Allergies  Codeine  Home Medications   Prior to Admission medications   Medication Sig Start Date End Date Taking? Authorizing Provider  amLODipine (NORVASC) 5 MG tablet Take 1 tablet (5 mg total) by mouth daily. 11/21/14   Ripudeep Krystal Eaton, MD  aspirin EC 325 MG EC tablet Take 1 tablet (325 mg total) by mouth daily. 11/21/14    Ripudeep Krystal Eaton, MD  atorvastatin (LIPITOR) 10 MG tablet Take 5 mg by mouth every morning.     Historical Provider, MD  cholecalciferol (VITAMIN D) 1000 UNITS tablet Take 2,000 Units by mouth every morning.    Historical Provider, MD  conjugated estrogens (PREMARIN) vaginal cream Place 1 Applicatorful vaginally as needed (burning).    Historical Provider, MD  Cyanocobalamin (B-12 COMPLIANCE INJECTION IJ) Inject as directed every 30 (thirty) days.    Historical Provider, MD  furosemide (LASIX) 20 MG tablet Take 2 tablets (40 mg total) by mouth daily. 11/24/14   Ripudeep Krystal Eaton, MD  glimepiride (AMARYL) 1 MG tablet Take 2 tablets (2 mg total) by mouth every morning. 01/17/15   Charlynne Cousins, MD  lisinopril (PRINIVIL,ZESTRIL) 20 MG tablet Take 1 tablet (20 mg total) by mouth every evening. 07/22/14   Maryann Mikhail, DO  lithium carbonate (LITHOBID) 300 MG CR tablet Take 150 mg by mouth every morning.    Historical Provider, MD  metoprolol (LOPRESSOR) 50 MG tablet Take 1 tablet (50 mg total) by mouth 2 (two) times daily. 07/22/14   Maryann Mikhail, DO  mirtazapine (REMERON) 30 MG tablet Take 30 mg by mouth every evening.    Historical Provider, MD  nitroGLYCERIN (NITROSTAT) 0.4 MG SL tablet Place 1 tablet (0.4 mg total) under the tongue every 5 (five) minutes as needed for  chest pain. 07/22/14   Maryann Mikhail, DO  omeprazole (PRILOSEC) 40 MG capsule Take 40 mg by mouth every evening.     Historical Provider, MD  oxybutynin (DITROPAN) 5 MG tablet Take 5 mg by mouth 2 (two) times daily.     Historical Provider, MD  pregabalin (LYRICA) 75 MG capsule Take 75 mg by mouth 2 (two) times daily.    Historical Provider, MD  traMADol (ULTRAM) 50 MG tablet Take 1 tablet (50 mg total) by mouth 2 (two) times daily as needed (pain). Patient taking differently: Take 50 mg by mouth 2 (two) times daily.  07/22/14   Maryann Mikhail, DO  Vilazodone HCl 20 MG TABS Take 20 mg by mouth every morning.    Historical Provider,  MD  zolpidem (AMBIEN) 5 MG tablet Take 2.5 mg by mouth at bedtime.     Historical Provider, MD   BP 147/92 mmHg  Pulse 106  Temp(Src) 104.4 F (40.2 C) (Oral)  Resp 46  Ht 5\' 3"  (1.6 m)  Wt 165 lb (74.844 kg)  BMI 29.24 kg/m2  SpO2 89% Physical Exam  Constitutional: She appears well-developed.  HENT:  Head: Normocephalic and atraumatic.  Eyes: Conjunctivae are normal. Right eye exhibits no discharge. Left eye exhibits no discharge.  Neck: Neck supple.  Cardiovascular: Regular rhythm and normal heart sounds.  Exam reveals no gallop and no friction rub.   No murmur heard. Mild tachycardia  Pulmonary/Chest: No respiratory distress.  Tachypnea  Abdominal: Soft. She exhibits no distension. There is tenderness. There is no rebound and no guarding.  Mild, diffuse tenderness? Soft. No distention.  Musculoskeletal: She exhibits edema.  Neurological:  Drowsy. Opens eyes to voice. Does respond to questioning but with what essentially amounts to grunting. Would open eyes to voice and squeeze fingers, otherwise nonfocal and commands.   Skin: Skin is warm and dry.  Nursing note and vitals reviewed.   ED Course  Procedures (including critical care time) Labs Review Labs Reviewed  CBC WITH DIFFERENTIAL/PLATELET - Abnormal; Notable for the following:    Neutro Abs 7.9 (*)    Lymphs Abs 0.3 (*)    All other components within normal limits  URINALYSIS, ROUTINE W REFLEX MICROSCOPIC (NOT AT [facility]) - Abnormal; Notable for the following:    Glucose, UA 250 (*)    Hgb urine dipstick SMALL (*)    Protein, ur 100 (*)    All other components within normal limits  COMPREHENSIVE METABOLIC PANEL - Abnormal; Notable for the following:    CO2 20 (*)    Glucose, Bld 261 (*)    Creatinine, Ser 1.19 (*)    AST 441 (*)    ALT 136 (*)    Alkaline Phosphatase 233 (*)    Total Bilirubin 3.0 (*)    GFR calc non Af Amer 41 (*)    GFR calc Af Amer 48 (*)    Anion gap 18 (*)    All other components  within normal limits  URINE MICROSCOPIC-ADD ON - Abnormal; Notable for the following:    Squamous Epithelial / LPF 0-5 (*)    Bacteria, UA MANY (*)    Casts GRANULAR CAST (*)    All other components within normal limits  I-STAT CG4 LACTIC ACID, ED - Abnormal; Notable for the following:    Lactic Acid, Venous 6.83 (*)    All other components within normal limits  CULTURE, BLOOD (ROUTINE X 2)  CULTURE, BLOOD (ROUTINE X 2)  TROPONIN I  LIPASE, BLOOD  I-STAT CG4 LACTIC ACID, ED    Imaging Review Dg Chest Portable 1 View  02/03/2015  CLINICAL DATA:  Fever EXAM: PORTABLE CHEST 1 VIEW COMPARISON:  01/14/2015 FINDINGS: Heart size mildly enlarged. Mild vascular congestion. Negative for edema or effusion. Prominent lung markings are similar to prior studies consistent with mild scarring. IMPRESSION: Mild congestive heart failure without significant edema or effusion. No definite pneumonia. Electronically Signed   By: Franchot Gallo M.D.   On: 02/03/2015 14:18   I have personally reviewed and evaluated these images and lab results as part of my medical decision-making.   EKG Interpretation   Date/Time:  Thursday February 03 2015 13:18:29 EST Ventricular Rate:  107 PR Interval:  148 QRS Duration: 95 QT Interval:  312 QTC Calculation: 416 R Axis:   -13 Text Interpretation:  Sinus tachycardia Biatrial enlargement Abnormal  R-wave progression, early transition Repol abnrm suggests ischemia,  diffuse leads Confirmed by Wilson Singer  MD, [place] ([id number]) on 02/03/2015 1:24:12  PM      MDM   Final diagnoses:  Fever, unspecified fever cause  Non-intractable vomiting with nausea, vomiting of unspecified type  Lactic acidosis  Abnormal LFTs    81 year old female with fever, abdominal pain and vomiting. Empiric antibiotics were ordered. She does have abnormal LFTs. Her abdomen is soft. She does endorse abdominal pain when asked but does not seem focally tender. Consider cholecystitis/cholangitis.  Lipase added. Urinalysis still pending, but this would not explain elevation or LFTs. We'll obtain imaging. Given unreliable review of systems, CT may be best initial imaging modality. May potentially end up needing [place]. Left acid is significantly elevated. She is getting IV fluids. She is not hypotensive so not initially aggressive with IVF with hx of CHF, hypoxemia and CXR showing vascular congestion.   Daughter at bedside. Understands her mother is critically ill. Confirms DNR status. Needs admission. Consultation to appropriate service pending imaging results.   Virgel Manifold, MD 02/03/15 [phone]

## 2015-02-03 NOTE — ED Notes (Signed)
Billy Fischer, MD notified of abnormal lab test results

## 2015-02-03 NOTE — ED Notes (Signed)
Pt arrives EMS from home with c/o vomiting and abdominal pain that became chest pain under bilateral breast. Family reports to EMS that emesis was dark but eating prunes today. Also noted dark stools lately.pt does not make verbal response to questions.

## 2015-02-03 NOTE — H&P (Signed)
[facility] History and Physical  ECHOE ALLY [id number] DOB: April 04, 1929 DOA: 02/03/2015  Referring physician: EDP PCP: Irven Shelling, MD   Chief Complaint: Emesis, abd pain   HPI: [name] is a 81 y.o. female who presents to the ED with c/o abdominal pain, emesis.  Symptoms onset just PTA to the ED today.  Was feeling fine earlier in the day.  Nothing makes symptoms better or worse.  Symptoms have improved after treatment in ED.  Initially patient was clearly septic with Tm 104.4 on arrival to ED that has now come down to 101.5.  Lactate was initially over 6, but with aggressive hydration this has improved to 2.  Review of Systems: Patients primary complaint right now is dry mouth and she states she is very thirsty.  Systems reviewed.  As above, otherwise negative  Past Medical History  Diagnosis Date  . Diabetes mellitus without complication ([place])   . Hypertension   . CKD (chronic kidney disease), stage III   . PVC (premature ventricular contraction)   . Hyperlipidemia   . GERD (gastroesophageal reflux disease)   . Osteopenia   . Chronic low back pain   . Postlaminectomy syndrome   . Right carpal tunnel syndrome   . Pernicious anemia   . Chronic interstitial cystitis   . Microhematuria   . Macular degeneration   . Chest pain     a. 07/2014 Neg MV, nl EF.   History reviewed. No pertinent past surgical history. Social History:  reports that she has never smoked. She has never used smokeless tobacco. She reports that she does not drink alcohol. Her drug history is not on file.  Allergies  Allergen Reactions  . Codeine Other (See Comments)    slurred speech    Family History  Problem Relation Age of Onset  . Arthritis Mother   . Heart attack Father      Prior to Admission medications   Medication Sig Start Date End Date Taking? Authorizing Provider  amLODipine (NORVASC) 5 MG tablet Take 1 tablet (5 mg total) by mouth daily. 11/21/14   Ripudeep Krystal Eaton,  MD  aspirin EC 325 MG EC tablet Take 1 tablet (325 mg total) by mouth daily. 11/21/14   Ripudeep Krystal Eaton, MD  atorvastatin (LIPITOR) 10 MG tablet Take 5 mg by mouth every morning.     Historical Provider, MD  cholecalciferol (VITAMIN D) 1000 UNITS tablet Take 2,000 Units by mouth every morning.    Historical Provider, MD  conjugated estrogens (PREMARIN) vaginal cream Place 1 Applicatorful vaginally as needed (burning).    Historical Provider, MD  Cyanocobalamin (B-12 COMPLIANCE INJECTION IJ) Inject as directed every 30 (thirty) days.    Historical Provider, MD  furosemide (LASIX) 20 MG tablet Take 2 tablets (40 mg total) by mouth daily. 11/24/14   Ripudeep Krystal Eaton, MD  glimepiride (AMARYL) 1 MG tablet Take 2 tablets (2 mg total) by mouth every morning. 01/17/15   Charlynne Cousins, MD  lisinopril (PRINIVIL,ZESTRIL) 20 MG tablet Take 1 tablet (20 mg total) by mouth every evening. 07/22/14   Maryann Mikhail, DO  lithium carbonate (LITHOBID) 300 MG CR tablet Take 150 mg by mouth every morning.    Historical Provider, MD  metoprolol (LOPRESSOR) 50 MG tablet Take 1 tablet (50 mg total) by mouth 2 (two) times daily. 07/22/14   Maryann Mikhail, DO  mirtazapine (REMERON) 30 MG tablet Take 30 mg by mouth every evening.    Historical Provider, MD  nitroGLYCERIN (  NITROSTAT) 0.4 MG SL tablet Place 1 tablet (0.4 mg total) under the tongue every 5 (five) minutes as needed for chest pain. 07/22/14   Maryann Mikhail, DO  omeprazole (PRILOSEC) 40 MG capsule Take 40 mg by mouth every evening.     Historical Provider, MD  oxybutynin (DITROPAN) 5 MG tablet Take 5 mg by mouth 2 (two) times daily.     Historical Provider, MD  pregabalin (LYRICA) 75 MG capsule Take 75 mg by mouth 2 (two) times daily.    Historical Provider, MD  traMADol (ULTRAM) 50 MG tablet Take 1 tablet (50 mg total) by mouth 2 (two) times daily as needed (pain). Patient taking differently: Take 50 mg by mouth 2 (two) times daily.  07/22/14   Maryann Mikhail,  DO  Vilazodone HCl 20 MG TABS Take 20 mg by mouth every morning.    Historical Provider, MD  zolpidem (AMBIEN) 5 MG tablet Take 2.5 mg by mouth at bedtime.     Historical Provider, MD   Physical Exam: Filed Vitals:   02/03/15 1915 02/03/15 1930  BP: 150/56 152/50  Pulse: 96 89  Temp:    Resp: 31 30    BP 152/50 mmHg  Pulse 89  Temp(Src) 101.5 F (38.6 C) (Rectal)  Resp 30  Ht 5\' 3"  (1.6 m)  Wt 74.844 kg (165 lb)  BMI 29.24 kg/m2  SpO2 95%  General Appearance:    Alert, oriented, no distress, appears stated age  Head:    Normocephalic, atraumatic  Eyes:    PERRL, EOMI, sclera non-icteric        Nose:   Nares without drainage or epistaxis. Mucosa, turbinates normal  Throat:   Dry mucous membranes. Oropharynx without erythema or exudate.  Neck:   Supple. No carotid bruits.  No thyromegaly.  No lymphadenopathy.   Back:     No CVA tenderness, no spinal tenderness  Lungs:     Clear to auscultation bilaterally, without wheezes, rhonchi or rales  Chest wall:    No tenderness to palpitation  Heart:    Regular rate and rhythm without murmurs, gallops, rubs  Abdomen:     Soft, non-tender, nondistended, normal bowel sounds, no organomegaly  Genitalia:    deferred  Rectal:    deferred  Extremities:   No clubbing, cyanosis or edema.  Pulses:   2+ and symmetric all extremities  Skin:   Skin color, texture, turgor normal, no rashes or lesions  Lymph nodes:   Cervical, supraclavicular, and axillary nodes normal  Neurologic:   CNII-XII intact. Normal strength, sensation and reflexes      throughout    Labs on Admission:  Basic Metabolic Panel:  Recent Labs Lab 02/03/15 1335  NA 143  K 3.6  CL 105  CO2 20*  GLUCOSE 261*  BUN 11  CREATININE 1.19*  CALCIUM 9.3   Liver Function Tests:  Recent Labs Lab 02/03/15 1335  AST 441*  ALT 136*  ALKPHOS 233*  BILITOT 3.0*  PROT 6.6  ALBUMIN 3.5    Recent Labs Lab 02/03/15 1702  LIPASE 32   No results for input(s):  AMMONIA in the last 168 hours. CBC:  Recent Labs Lab 02/03/15 1335  WBC 8.3  NEUTROABS 7.9*  HGB 13.3  HCT 42.2  MCV 90.8  PLT 199   Cardiac Enzymes:  Recent Labs Lab 02/03/15 1335  TROPONINI <0.03    BNP (last 3 results) No results for input(s): PROBNP in the last 8760 hours. CBG: No results for input(s):  GLUCAP in the last 168 hours.  Radiological Exams on Admission: Ct Abdomen Pelvis W Contrast  02/03/2015  CLINICAL DATA:  Fever, abdominal pain, abnormal LFTs EXAM: CT ABDOMEN AND PELVIS WITH CONTRAST TECHNIQUE: Multidetector CT imaging of the abdomen and pelvis was performed using the standard protocol following bolus administration of intravenous contrast. CONTRAST:  27mL OMNIPAQUE IOHEXOL 300 MG/ML  SOLN COMPARISON:  03/23/2010 FINDINGS: Lower chest:  Mild bibasilar chronic interstitial disease. Hepatobiliary: No focal hepatic mass. Intrahepatic biliary ductal dilatation. Dilated common bile duct to the level of the pancreatic head. Mild pericholecystic fluid or wall thickening. Pancreas: 2.8 X 2.4 cm cystic pancreatic head mass with a punctate mural calcification. Spleen: Normal. Adrenals/Urinary Tract: Normal adrenal glands. 10 mm hypodense, fluid attenuating left renal mass most consistent with a cyst. Normal right kidney. No obstructive uropathy or urolithiasis. Unremarkable bladder. Stomach/Bowel: Duo dual diverticulum is noted. No bowel wall thickening or dilatation. Diverticulosis of the descending and sigmoid colon without evidence of diverticulitis. Rectal fecal impaction. No pneumatosis, pneumoperitoneum or portal venous gas. No abdominal or pelvic free fluid. Small fat containing umbilical hernia. Vascular/Lymphatic: Normal caliber abdominal aorta with atherosclerosis. No abdominal or pelvic lymphadenopathy. Reproductive: Normal uterus.  No adnexal mass. Other: No fluid collection or hematoma. Musculoskeletal: No acute osseous abnormality. No aggressive lytic or sclerotic  osseous lesion. Left hip arthroplasty. Degenerative disc disease throughout the lumbar spine with diffuse facet arthropathy. IMPRESSION: 1. No acute abdominal or pelvic pathology. 2. 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 3. Intrahepatic and extrahepatic biliary ductal dilatation with abrupt narrowing at the pancreatic head. No focal ampullary mass or choledocholithiasis is identified. 4. Mild pericholecystic fluid or wall thickening. This appearance can be seen with cholecystitis. If there is further clinical concern, recommend a right upper quadrant ultrasound. Electronically Signed   By: Kathreen Devoid   On: 02/03/2015 17:03   Dg Chest Portable 1 View  02/03/2015  CLINICAL DATA:  Fever EXAM: PORTABLE CHEST 1 VIEW COMPARISON:  01/14/2015 FINDINGS: Heart size mildly enlarged. Mild vascular congestion. Negative for edema or effusion. Prominent lung markings are similar to prior studies consistent with mild scarring. IMPRESSION: Mild congestive heart failure without significant edema or effusion. No definite pneumonia. Electronically Signed   By: Franchot Gallo M.D.   On: 02/03/2015 14:18    EKG: Independently reviewed.  Assessment/Plan Principal Problem:   Cholangitis Active Problems:   Type 2 diabetes mellitus with hyperglycemia (HCC)   Chronic diastolic heart failure (HCC)   Essential hypertension, malignant   Sepsis ([place])   1. Cholangitis causing sepsis - given improvement and spontaneous resolution of abd pain and absence of stone on imaging study, I wonder if the patient may have successfully passed the gallstone. 1. Patient improved after IVF in ED 2. Continue IVF 3. NPO except ice chips and sips with meds 4. Zosyn empirically 5. GI consulted 6. Lactate trending down 7. Tylenol PRN fever 8. Zofran for nausea, morphine if needed for pain (though she says her pain has spontaneously  resolved at this point. 9. Repeat BMP in AM to monitor LFTs.  Patient with known previous ALT elevation, but the AST elevation is new. 2. Chronic diastolic CHF - watch for fluid overload with ongoing IVF resusitation, but feel she is clinically dry at the moment still.  Holding lasix. 3. HTN - continue home meds except lasix 4. DM2 - hold amaryl, putting  patient on SSI q4h while NPO  EDP spoke with [facility] on call who will see patient.  Code Status: DNR Family Communication: Family at bedside Disposition Plan: Admit to inpatient   Time spent: 41 min  GARDNER, JARED M. [facility] Pager [phone]  If 7AM-7PM, please contact the day team taking care of the patient [email or web address] Password [email or web address] 02/03/2015, 8:20 PM

## 2015-02-04 LAB — COMPREHENSIVE METABOLIC PANEL
ALK PHOS: 171 U/L — AB (ref 38–126)
ALT: 189 U/L — AB (ref 14–54)
AST: 276 U/L — AB (ref 15–41)
Albumin: 2.6 g/dL — ABNORMAL LOW (ref 3.5–5.0)
Anion gap: 11 (ref 5–15)
BUN: 12 mg/dL (ref 6–20)
CALCIUM: 8.3 mg/dL — AB (ref 8.9–10.3)
CHLORIDE: 106 mmol/L (ref 101–111)
CO2: 26 mmol/L (ref 22–32)
CREATININE: 1.29 mg/dL — AB (ref 0.44–1.00)
GFR, EST AFRICAN AMERICAN: 43 mL/min — AB (ref >60)
GFR, EST NON AFRICAN AMERICAN: 37 mL/min — AB (ref >60)
Glucose, Bld: 193 mg/dL — ABNORMAL HIGH (ref 65–99)
Potassium: 3.1 mmol/L — ABNORMAL LOW (ref 3.5–5.1)
SODIUM: 143 mmol/L (ref 135–145)
Total Bilirubin: 3.2 mg/dL — ABNORMAL HIGH (ref 0.3–1.2)
Total Protein: 5 g/dL — ABNORMAL LOW (ref 6.5–8.1)

## 2015-02-04 LAB — GLUCOSE, CAPILLARY
GLUCOSE-CAPILLARY: 169 mg/dL — AB (ref 65–99)
GLUCOSE-CAPILLARY: 108 mg/dL — AB (ref 65–99)
GLUCOSE-CAPILLARY: 115 mg/dL — AB (ref 65–99)
GLUCOSE-CAPILLARY: 154 mg/dL — AB (ref 65–99)
Glucose-Capillary: 271 mg/dL — ABNORMAL HIGH (ref 65–99)
Glucose-Capillary: 153 mg/dL — ABNORMAL HIGH (ref 65–99)

## 2015-02-04 LAB — CBC
HCT: 34.5 % — ABNORMAL LOW (ref 36.0–46.0)
HEMOGLOBIN: 10.7 g/dL — AB (ref 12.0–15.0)
MCH: 27.9 pg (ref 26.0–34.0)
MCHC: 31 g/dL (ref 30.0–36.0)
MCV: 90.1 fL (ref 78.0–100.0)
PLATELETS: 160 10*3/uL (ref 150–400)
RBC: 3.83 MIL/uL — AB (ref 3.87–5.11)
RDW: 13.3 % (ref 11.5–15.5)
WBC: 13.8 10*3/uL — AB (ref 4.0–10.5)

## 2015-02-04 LAB — MRSA PCR SCREENING: MRSA by PCR: NEGATIVE

## 2015-02-04 MED ORDER — PIPERACILLIN-TAZOBACTAM 3.375 G IVPB
3.3750 g | Freq: Three times a day (TID) | INTRAVENOUS | Status: DC
  Administered 2015-02-04 – 2015-02-08 (×12): 3.375 g via INTRAVENOUS
  Filled 2015-02-04 (×15): qty 50

## 2015-02-04 MED ORDER — LITHIUM CITRATE 300 MG/5 ML PO SYRP
75.0000 mg | Freq: Two times a day (BID) | ORAL | Status: DC
  Administered 2015-02-04 – 2015-02-09 (×11): 78 mg via ORAL
  Filled 2015-02-04 (×14): qty 1.3

## 2015-02-04 MED ORDER — CETYLPYRIDINIUM CHLORIDE 0.05 % MT LIQD
7.0000 mL | Freq: Two times a day (BID) | OROMUCOSAL | Status: DC
  Administered 2015-02-04 – 2015-02-09 (×11): 7 mL via OROMUCOSAL

## 2015-02-04 NOTE — Progress Notes (Signed)
[facility]  Patient Status: Active (receiving services up to time of hospitalization)  [facility] is providing the following services: RN and PT  If patient discharges after hours, please call [phone].   [name] 02/04/2015, 10:29 AM

## 2015-02-04 NOTE — Progress Notes (Signed)
[facility] Progress Note     Subjective: Pt some what confused.  No family at bedside.  Pt says she has no N/V or abdominal pain/distension today.  She knows she was vomiting a lot yesterday when she came into the hospital.  Unknown if she's passed flatus, recorded BM yesterday.    Objective: Vital signs in last 24 hours: Temp:  [98.8 F (37.1 C)-104.4 F (40.2 C)] 98.8 F (37.1 C) (12/02 0400) Pulse Rate:  [63-106] 63 (12/02 0400) Resp:  [17-46] 25 (12/02 0400) BP: (112-205)/(36-92) 117/44 mmHg (12/02 0400) SpO2:  [88 %-98 %] 98 % (12/02 0400) Weight:  [74.844 kg (165 lb)-77.3 kg (170 lb 6.7 oz)] 77.3 kg (170 lb 6.7 oz) (12/01 2134) Last BM Date: 02/03/15  Intake/Output from previous day: 12/01 0701 - 12/02 0700 In: 1002.1 [I.V.:1002.1] Out: -  Intake/Output this shift:    PE: Gen:  Alert, NAD, pleasant Card:  RRR, no M/G/R heard Pulm:  CTA, no W/R/R Abd: Soft, mild distension, NT, few BS, no HSM lower midline abdominal scars noted and well healed   Lab Results:   Recent Labs  02/03/15 1335 02/04/15 0428  WBC 8.3 13.8*  HGB 13.3 10.7*  HCT 42.2 34.5*  PLT 199 160   BMET  Recent Labs  02/03/15 1335 02/04/15 0428  NA 143 143  K 3.6 3.1*  CL 105 106  CO2 20* 26  GLUCOSE 261* 193*  BUN 11 12  CREATININE 1.19* 1.29*  CALCIUM 9.3 8.3*   PT/INR No results for input(s): LABPROT, INR in the last 72 hours. CMP     Component Value Date/Time   NA 143 02/04/2015 0428   K 3.1* 02/04/2015 0428   CL 106 02/04/2015 0428   CO2 26 02/04/2015 0428   GLUCOSE 193* 02/04/2015 0428   BUN 12 02/04/2015 0428   CREATININE 1.29* 02/04/2015 0428   CALCIUM 8.3* 02/04/2015 0428   PROT 5.0* 02/04/2015 0428   ALBUMIN 2.6* 02/04/2015 0428   AST 276* 02/04/2015 0428   ALT 189* 02/04/2015 0428   ALKPHOS 171* 02/04/2015 0428   BILITOT 3.2* 02/04/2015 0428   GFRNONAA 37* 02/04/2015 0428   GFRAA 43* 02/04/2015 0428   Lipase     Component Value Date/Time    LIPASE 32 02/03/2015 1702       Studies/Results: Ct Abdomen Pelvis W Contrast  02/03/2015  CLINICAL DATA:  Fever, abdominal pain, abnormal LFTs EXAM: CT ABDOMEN AND PELVIS WITH CONTRAST TECHNIQUE: Multidetector CT imaging of the abdomen and pelvis was performed using the standard protocol following bolus administration of intravenous contrast. CONTRAST:  41mL OMNIPAQUE IOHEXOL 300 MG/ML  SOLN COMPARISON:  03/23/2010 FINDINGS: Lower chest:  Mild bibasilar chronic interstitial disease. Hepatobiliary: No focal hepatic mass. Intrahepatic biliary ductal dilatation. Dilated common bile duct to the level of the pancreatic head. Mild pericholecystic fluid or wall thickening. Pancreas: 2.8 X 2.4 cm cystic pancreatic head mass with a punctate mural calcification. Spleen: Normal. Adrenals/Urinary Tract: Normal adrenal glands. 10 mm hypodense, fluid attenuating left renal mass most consistent with a cyst. Normal right kidney. No obstructive uropathy or urolithiasis. Unremarkable bladder. Stomach/Bowel: Duo dual diverticulum is noted. No bowel wall thickening or dilatation. Diverticulosis of the descending and sigmoid colon without evidence of diverticulitis. Rectal fecal impaction. No pneumatosis, pneumoperitoneum or portal venous gas. No abdominal or pelvic free fluid. Small fat containing umbilical hernia. Vascular/Lymphatic: Normal caliber abdominal aorta with atherosclerosis. No abdominal or pelvic lymphadenopathy. Reproductive: Normal uterus.  No adnexal mass. Other: No  fluid collection or hematoma. Musculoskeletal: No acute osseous abnormality. No aggressive lytic or sclerotic osseous lesion. Left hip arthroplasty. Degenerative disc disease throughout the lumbar spine with diffuse facet arthropathy. IMPRESSION: 1. No acute abdominal or pelvic pathology. 2. 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 3. Intrahepatic  and extrahepatic biliary ductal dilatation with abrupt narrowing at the pancreatic head. No focal ampullary mass or choledocholithiasis is identified. 4. Mild pericholecystic fluid or wall thickening. This appearance can be seen with cholecystitis. If there is further clinical concern, recommend a right upper quadrant ultrasound. Electronically Signed   By: Kathreen Devoid   On: 02/03/2015 17:03   Dg Chest Portable 1 View  02/03/2015  CLINICAL DATA:  Fever EXAM: PORTABLE CHEST 1 VIEW COMPARISON:  01/14/2015 FINDINGS: Heart size mildly enlarged. Mild vascular congestion. Negative for edema or effusion. Prominent lung markings are similar to prior studies consistent with mild scarring. IMPRESSION: Mild congestive heart failure without significant edema or effusion. No definite pneumonia. Electronically Signed   By: Franchot Gallo M.D.   On: 02/03/2015 14:18    Anti-infectives: Anti-infectives    Start     Dose/Rate Route Frequency Ordered Stop   02/04/15 1430  vancomycin (VANCOCIN) IVPB 1000 mg/200 mL premix  Status:  Discontinued     1,000 mg 200 mL/hr over 60 Minutes Intravenous Every 24 hours 02/03/15 1444 02/03/15 2015   02/03/15 2030  piperacillin-tazobactam (ZOSYN) IVPB 3.375 g     3.375 g 100 mL/hr over 30 Minutes Intravenous  Once 02/03/15 2019 02/03/15 2347   02/03/15 1400  vancomycin (VANCOCIN) 1,500 mg in sodium chloride 0.9 % 500 mL IVPB     1,500 mg 250 mL/hr over 120 Minutes Intravenous STAT 02/03/15 1349 02/03/15 2007   02/03/15 1345  piperacillin-tazobactam (ZOSYN) IVPB 3.375 g     3.375 g 100 mL/hr over 30 Minutes Intravenous  Once 02/03/15 1334 02/03/15 1515       Assessment/Plan Cholangitis with sepsis Pancreatic head mass with intra/extrahepatic biliary duct dilation Elevated LFTs -The mass was there in 2012 and pt underwent EUS/aspiration by Dr Paulita Fujita. No evidence of malignancy but limited evaluation. In review of her imaging, she has had some dilated duct as far back as  2004. The pancreatic mass has been present 2006 (albeit smaller). Given it's chronicity suspicion for malignancy is low. -No evidence of cholelithiasis -She does duodenal diverticulum which could be contributing.  -Cont IV Abx (Vanc/zosyn).  F/u cx.  -Needs GI evaluation (as i discussed with EDP last night - GI needs to see soon) -LFT's are worsening, bili up to 3.2, lipase normal, WBC up to 13.8, most recent lactic acid is down to 2.59 -Can consider HIDA, but would hold off for now because we doubt GB source given pancreatic head mass -Believe pt will ultimately need better imaging of hepatobiliary system with MRCP vs EUS/ERCP, Dr. Michail Sermon from [facility] to see -NPO, IVF resuscitation -From what we've seen so far, don't feel she would be candidate for a whipple due to deconditioning, age, and chronic medical problems, patient also has a DNR listed in epic  Altered mental status - sepsis, pin meds, vs Ambien she received last night? Chronic diastolic CHF HTN DM2     LOS: 1 day    Nat Christen 02/04/2015, 7:40 AM Pager: [phone]

## 2015-02-04 NOTE — Progress Notes (Signed)
CRITICAL VALUE ALERT  Critical value received:  Blood culture: gram negative rods in anaerobic bottle  Date of notification:  02/04/2015   Time of notification:  2:05 AM   Critical value read back:Yes.    Nurse who received alert:  Reap, Jon Gills   MD notified (1st page):  TRH  Time of first page:  2:05 AM   MD notified (2nd page):  Time of second page:  Responding MD:    Time MD responded:

## 2015-02-04 NOTE — Consult Note (Signed)
Reason for Consult:cholangitis; questions cholecystitis Referring Physician: Farzana Koci is an 81 y.o. female.  HPI: 81yo white female with h/o DM, HTN, CKD, pancreatic head cystic mass who came to ED Thursday with daughter with abd pain, n/v that reportedly began Thursday.  I discussed case with Dr Billy Fischer but was unavailable to get to bedside sooner due to operative cases. Pt has had an ambien and while stable without any respiratory compromise, I'm really not able to rouse her out of sleep. H/o is from chart. In ED, pt had high fever, c/o abd pain, and elevated lactate and was found to have elevated LFTs and dilated bile duct. Question of gallbladder wall thickening and fluid on CT. Pt also with chronic pancreatic head mass which appears to be causing bile duct obstruction. Started on abx and IVF.   Past Medical History  Diagnosis Date  . Diabetes mellitus without complication ([place])   . Hypertension   . CKD (chronic kidney disease), stage III   . PVC (premature ventricular contraction)   . Hyperlipidemia   . GERD (gastroesophageal reflux disease)   . Osteopenia   . Chronic low back pain   . Postlaminectomy syndrome   . Right carpal tunnel syndrome   . Pernicious anemia   . Chronic interstitial cystitis   . Microhematuria   . Macular degeneration   . Chest pain     a. 07/2014 Neg MV, nl EF.    History reviewed. No pertinent past surgical history.  Family History  Problem Relation Age of Onset  . Arthritis Mother   . Heart attack Father     Social History:  reports that she has never smoked. She has never used smokeless tobacco. She reports that she does not drink alcohol. Her drug history is not on file.  Allergies:  Allergies  Allergen Reactions  . Codeine Other (See Comments)    slurred speech    Medications: I have reviewed the patient's current medications.  Results for orders placed or performed during the hospital encounter of 02/03/15 (from the past  48 hour(s))  CBC with Differential     Status: Abnormal   Collection Time: 02/03/15  1:35 PM  Result Value Ref Range   WBC 8.3 4.0 - 10.5 K/uL   RBC 4.65 3.87 - 5.11 MIL/uL   Hemoglobin 13.3 12.0 - 15.0 g/dL   HCT 42.2 36.0 - 46.0 %   MCV 90.8 78.0 - 100.0 fL   MCH 28.6 26.0 - 34.0 pg   MCHC 31.5 30.0 - 36.0 g/dL   RDW 13.2 11.5 - 15.5 %   Platelets 199 150 - 400 K/uL   Neutrophils Relative % 95 %   Neutro Abs 7.9 (H) 1.7 - 7.7 K/uL   Lymphocytes Relative 4 %   Lymphs Abs 0.3 (L) 0.7 - 4.0 K/uL   Monocytes Relative 1 %   Monocytes Absolute 0.1 0.1 - 1.0 K/uL   Eosinophils Relative 0 %   Eosinophils Absolute 0.0 0.0 - 0.7 K/uL   Basophils Relative 0 %   Basophils Absolute 0.0 0.0 - 0.1 K/uL  Troponin I     Status: None   Collection Time: 02/03/15  1:35 PM  Result Value Ref Range   Troponin I <0.03 <0.031 ng/mL    Comment:        NO INDICATION OF MYOCARDIAL INJURY.   Comprehensive metabolic panel     Status: Abnormal   Collection Time: 02/03/15  1:35 PM  Result Value Ref Range  Sodium 143 135 - 145 mmol/L   Potassium 3.6 3.5 - 5.1 mmol/L   Chloride 105 101 - 111 mmol/L   CO2 20 (L) 22 - 32 mmol/L   Glucose, Bld 261 (H) 65 - 99 mg/dL   BUN 11 6 - 20 mg/dL   Creatinine, Ser 1.19 (H) 0.44 - 1.00 mg/dL   Calcium 9.3 8.9 - 10.3 mg/dL   Total Protein 6.6 6.5 - 8.1 g/dL   Albumin 3.5 3.5 - 5.0 g/dL   AST 441 (H) 15 - 41 U/L   ALT 136 (H) 14 - 54 U/L   Alkaline Phosphatase 233 (H) 38 - 126 U/L   Total Bilirubin 3.0 (H) 0.3 - 1.2 mg/dL   GFR calc non Af Amer 41 (L) >60 mL/min   GFR calc Af Amer 48 (L) >60 mL/min    Comment: (NOTE) The eGFR has been calculated using the CKD EPI equation. This calculation has not been validated in all clinical situations. eGFR's persistently <60 mL/min signify possible Chronic Kidney Disease.    Anion gap 18 (H) 5 - 15  I-Stat CG4 Lactic Acid, ED     Status: Abnormal   Collection Time: 02/03/15  2:02 PM  Result Value Ref Range   Lactic  Acid, Venous 6.83 (HH) 0.5 - 2.0 mmol/L   Comment NOTIFIED PHYSICIAN   Urinalysis, Routine w reflex microscopic (not at [facility])     Status: Abnormal   Collection Time: 02/03/15  3:10 PM  Result Value Ref Range   Color, Urine YELLOW YELLOW   APPearance CLEAR CLEAR   Specific Gravity, Urine 1.013 1.005 - 1.030   pH 5.5 5.0 - 8.0   Glucose, UA 250 (A) NEGATIVE mg/dL   Hgb urine dipstick SMALL (A) NEGATIVE   Bilirubin Urine NEGATIVE NEGATIVE   Ketones, ur NEGATIVE NEGATIVE mg/dL   Protein, ur 100 (A) NEGATIVE mg/dL   Nitrite NEGATIVE NEGATIVE   Leukocytes, UA NEGATIVE NEGATIVE  Urine microscopic-add on     Status: Abnormal   Collection Time: 02/03/15  3:10 PM  Result Value Ref Range   Squamous Epithelial / LPF 0-5 (A) NONE SEEN   WBC, UA 0-5 0 - 5 WBC/hpf   RBC / HPF 0-5 0 - 5 RBC/hpf   Bacteria, UA MANY (A) NONE SEEN   Casts GRANULAR CAST (A) NEGATIVE  Lipase, blood     Status: None   Collection Time: 02/03/15  5:02 PM  Result Value Ref Range   Lipase 32 11 - 51 U/L  I-Stat CG4 Lactic Acid, ED     Status: Abnormal   Collection Time: 02/03/15  5:18 PM  Result Value Ref Range   Lactic Acid, Venous 2.59 (HH) 0.5 - 2.0 mmol/L   Comment NOTIFIED PHYSICIAN   Glucose, capillary     Status: Abnormal   Collection Time: 02/03/15  9:28 PM  Result Value Ref Range   Glucose-Capillary 261 (H) 65 - 99 mg/dL  MRSA PCR Screening     Status: None   Collection Time: 02/03/15 10:13 PM  Result Value Ref Range   MRSA by PCR NEGATIVE NEGATIVE    Comment:        The GeneXpert MRSA Assay (FDA approved for NASAL specimens only), is one component of a comprehensive MRSA colonization surveillance program. It is not intended to diagnose MRSA infection nor to guide or monitor treatment for MRSA infections.     Ct Abdomen Pelvis W Contrast  02/03/2015  CLINICAL DATA:  Fever, abdominal pain, abnormal LFTs  EXAM: CT ABDOMEN AND PELVIS WITH CONTRAST TECHNIQUE: Multidetector CT imaging of the abdomen  and pelvis was performed using the standard protocol following bolus administration of intravenous contrast. CONTRAST:  53m OMNIPAQUE IOHEXOL 300 MG/ML  SOLN COMPARISON:  03/23/2010 FINDINGS: Lower chest:  Mild bibasilar chronic interstitial disease. Hepatobiliary: No focal hepatic mass. Intrahepatic biliary ductal dilatation. Dilated common bile duct to the level of the pancreatic head. Mild pericholecystic fluid or wall thickening. Pancreas: 2.8 X 2.4 cm cystic pancreatic head mass with a punctate mural calcification. Spleen: Normal. Adrenals/Urinary Tract: Normal adrenal glands. 10 mm hypodense, fluid attenuating left renal mass most consistent with a cyst. Normal right kidney. No obstructive uropathy or urolithiasis. Unremarkable bladder. Stomach/Bowel: Duo dual diverticulum is noted. No bowel wall thickening or dilatation. Diverticulosis of the descending and sigmoid colon without evidence of diverticulitis. Rectal fecal impaction. No pneumatosis, pneumoperitoneum or portal venous gas. No abdominal or pelvic free fluid. Small fat containing umbilical hernia. Vascular/Lymphatic: Normal caliber abdominal aorta with atherosclerosis. No abdominal or pelvic lymphadenopathy. Reproductive: Normal uterus.  No adnexal mass. Other: No fluid collection or hematoma. Musculoskeletal: No acute osseous abnormality. No aggressive lytic or sclerotic osseous lesion. Left hip arthroplasty. Degenerative disc disease throughout the lumbar spine with diffuse facet arthropathy. IMPRESSION: 1. No acute abdominal or pelvic pathology. 2. 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 3. Intrahepatic and extrahepatic biliary ductal dilatation with abrupt narrowing at the pancreatic head. No focal ampullary mass or choledocholithiasis is identified. 4. Mild pericholecystic fluid or wall thickening. This appearance can be seen with  cholecystitis. If there is further clinical concern, recommend a right upper quadrant ultrasound. Electronically Signed   By: HKathreen Devoid  On: 02/03/2015 17:03   Dg Chest Portable 1 View  02/03/2015  CLINICAL DATA:  Fever EXAM: PORTABLE CHEST 1 VIEW COMPARISON:  01/14/2015 FINDINGS: Heart size mildly enlarged. Mild vascular congestion. Negative for edema or effusion. Prominent lung markings are similar to prior studies consistent with mild scarring. IMPRESSION: Mild congestive heart failure without significant edema or effusion. No definite pneumonia. Electronically Signed   By: CFranchot GalloM.D.   On: 02/03/2015 14:18    Review of Systems  Unable to perform ROS: other   Blood pressure 124/53, pulse 80, temperature 99.3 F (37.4 C), temperature source Oral, resp. rate 30, height 5' 4" (1.626 m), weight 77.3 kg (170 lb 6.7 oz), SpO2 95 %. Physical Exam  Vitals reviewed. Constitutional: She appears well-developed and well-nourished. No distress.  Asleep; really can't rouse. Partially open eyes.   HENT:  Head: Normocephalic and atraumatic.  Right Ear: External ear normal.  Left Ear: External ear normal.  Eyes: Conjunctivae are normal. No scleral icterus.  Neck: Neck supple. No tracheal deviation present.  Cardiovascular: Normal rate and normal heart sounds.   Respiratory: Effort normal and breath sounds normal. No stridor. No respiratory distress. She has no wheezes.  GI: Soft. She exhibits no distension.  Old lower midline incision; doesn't grimace/flinch with deep palpation  Musculoskeletal: She exhibits no edema.  Lymphadenopathy:    She has no cervical adenopathy.  Neurological: She exhibits normal muscle tone.  Skin: Skin is warm and dry. No rash noted. She is not diaphoretic. No erythema. No pallor.    Assessment/Plan: Cholangitis Pancreatic head mass Abdominal pain HTN DM 2 H/o heart failure H/o TIA H/o a flutter Duodenal diveriticulum  Assessment is limited given  pt's inability to participate.  On imaging appears elevated LFTs/bile duct dilation due to panc head mass It was there in 2012 and pt underwent EUS/aspiration by Dr Paulita Fujita. No evidence of malignancy but limited evaluation. In review of her imaging, she has had some dilated duct as far back as 2004. The pancreatic mass has been present 2006 (albeit smaller). Given it's chronicity suspicion for malignancy is low i don't think pt passed a gallstone and is the reason for elevated LFTs. So far no evidence of cholelithiasis.   She does duodenal diverticulum which could be contributing.   Cont IV Abx for now. F/u cx.  Needs GI evaluation (as i discussed with EDP last night - GI needs to see soon) Will need to discuss with GI in am Will need to reassess in am when pt able to participate F/u LFTs in am If there is question of GB infection can always get a HIDA Believe pt will ultimately need better imaging of hepatobiliary system with MRCP vs EUS/ERCP  - to discuss with GI in am Maintain NPO for now  Leighton Ruff. Redmond Pulling, MD, FACS General, Bariatric, & Minimally Invasive Surgery [facility], [place]    [facility] M 02/04/2015, 12:22 AM

## 2015-02-04 NOTE — Progress Notes (Signed)
[facility] PROGRESS NOTE  [name] [id number] DOB: Mar 05, 1930 DOA: 02/03/2015 PCP: Irven Shelling, MD  Assessment/Plan: 1. Possible cholangitis -Clinically suspect passed stone however she also has a cystic lesion in the pancreatic head, which could be causing extrinsic compression -Keep nothing by mouth, IV Zosyn, GI consulted -ERCP versus MRCP -Supportive care  2. 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 Chronic diastolic CHF -Clinically dry to slightly euvolemic at this time, continue gentle IVF, holding Lasix  4. Diabetes mellitus -Hold Amaryl, sliding scale insulin for now  5. History of CVA with hemiplegia/Aphasia -ASA on hold  6. CKD3 -stable  DVT proph: Hep SQ  Consultants: Gi CCS  HPI/Subjective: Feels ok, no further abd pain  Objective: Filed Vitals:   02/04/15 0400 02/04/15 0807  BP: 117/44 126/47  Pulse: 63 67  Temp: 98.8 F (37.1 C) 98.9 F (37.2 C)  Resp: 25 21    Intake/Output Summary (Last 24 hours) at 02/04/15 1241 Last data filed at 02/04/15 0500  Gross per 24 hour  Intake 1002.08 ml  Output      0 ml  Net 1002.08 ml   Filed Weights   02/03/15 1324 02/03/15 2134  Weight: 74.844 kg (165 lb) 77.3 kg (170 lb 6.7 oz)    Exam:   General:  AAOx3, dysarthric  Cardiovascular: S1S2/RRR  Respiratory: CTAB  Abdomen: soft, NT, BS present  Musculoskeletal: no edema   L hemiplegia   Data Reviewed: Basic Metabolic Panel:  Recent Labs Lab 02/03/15 1335 02/04/15 0428  NA 143 143  K 3.6 3.1*  CL 105 106  CO2 20* 26  GLUCOSE 261* 193*  BUN 11 12  CREATININE 1.19* 1.29*  CALCIUM 9.3 8.3*   Liver Function Tests:  Recent Labs Lab 02/03/15 1335 02/04/15 0428  AST 441* 276*  ALT 136* 189*  ALKPHOS 233* 171*  BILITOT 3.0* 3.2*  PROT 6.6 5.0*  ALBUMIN 3.5 2.6*    Recent Labs Lab 02/03/15 1702  LIPASE 32   No results  for input(s): AMMONIA in the last 168 hours. CBC:  Recent Labs Lab 02/03/15 1335 02/04/15 0428  WBC 8.3 13.8*  NEUTROABS 7.9*  --   HGB 13.3 10.7*  HCT 42.2 34.5*  MCV 90.8 90.1  PLT 199 160   Cardiac Enzymes:  Recent Labs Lab 02/03/15 1335  TROPONINI <0.03   BNP (last 3 results)  Recent Labs  07/18/14 1216 01/13/15 0926 01/14/15 1040  BNP 250.6* 173.8* 45.0    ProBNP (last 3 results) No results for input(s): PROBNP in the last 8760 hours.  CBG:  Recent Labs Lab 02/03/15 2128 02/04/15 0049 02/04/15 0428 02/04/15 0804  GLUCAP 261* 271* 169* 108*    Recent Results (from the past 240 hour(s))  Blood culture (routine x 2)     Status: None (Preliminary result)   Collection Time: 02/03/15  1:35 PM  Result Value Ref Range Status   Specimen Description BLOOD RIGHT ARM  Final   Special Requests BOTTLES DRAWN AEROBIC AND ANAEROBIC 5CC  Final   Culture  Setup Time   Final    GRAM NEGATIVE RODS IN BOTH AEROBIC AND ANAEROBIC BOTTLES CRITICAL RESULT CALLED TO, READ BACK BY AND VERIFIED WITH: B REAP @0202   02/04/15 MKELLY    Culture GRAM NEGATIVE RODS  Final   Report Status PENDING  Incomplete  MRSA PCR Screening     Status: None   Collection Time: 02/03/15 10:13 PM  Result Value Ref Range Status   MRSA by PCR NEGATIVE NEGATIVE Final    Comment:        The GeneXpert MRSA Assay (FDA approved for NASAL specimens only), is one component of a comprehensive MRSA colonization surveillance program. It is not intended to diagnose MRSA infection nor to guide or monitor treatment for MRSA infections.      Studies: Ct Abdomen Pelvis W Contrast  02/03/2015  CLINICAL DATA:  Fever, abdominal pain, abnormal LFTs EXAM: CT ABDOMEN AND PELVIS WITH CONTRAST TECHNIQUE: Multidetector CT imaging of the abdomen and pelvis was performed using the standard protocol following bolus administration of intravenous contrast. CONTRAST:  47mL OMNIPAQUE IOHEXOL 300 MG/ML  SOLN  COMPARISON:  03/23/2010 FINDINGS: Lower chest:  Mild bibasilar chronic interstitial disease. Hepatobiliary: No focal hepatic mass. Intrahepatic biliary ductal dilatation. Dilated common bile duct to the level of the pancreatic head. Mild pericholecystic fluid or wall thickening. Pancreas: 2.8 X 2.4 cm cystic pancreatic head mass with a punctate mural calcification. Spleen: Normal. Adrenals/Urinary Tract: Normal adrenal glands. 10 mm hypodense, fluid attenuating left renal mass most consistent with a cyst. Normal right kidney. No obstructive uropathy or urolithiasis. Unremarkable bladder. Stomach/Bowel: Duo dual diverticulum is noted. No bowel wall thickening or dilatation. Diverticulosis of the descending and sigmoid colon without evidence of diverticulitis. Rectal fecal impaction. No pneumatosis, pneumoperitoneum or portal venous gas. No abdominal or pelvic free fluid. Small fat containing umbilical hernia. Vascular/Lymphatic: Normal caliber abdominal aorta with atherosclerosis. No abdominal or pelvic lymphadenopathy. Reproductive: Normal uterus.  No adnexal mass. Other: No fluid collection or hematoma. Musculoskeletal: No acute osseous abnormality. No aggressive lytic or sclerotic osseous lesion. Left hip arthroplasty. Degenerative disc disease throughout the lumbar spine with diffuse facet arthropathy. IMPRESSION: 1. No acute abdominal or pelvic pathology. 2. 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 3. Intrahepatic and extrahepatic biliary ductal dilatation with abrupt narrowing at the pancreatic head. No focal ampullary mass or choledocholithiasis is identified. 4. Mild pericholecystic fluid or wall thickening. This appearance can be seen with cholecystitis. If there is further clinical concern, recommend a right upper quadrant ultrasound. Electronically Signed   By: Kathreen Devoid   On: 02/03/2015 17:03   Dg  Chest Portable 1 View  02/03/2015  CLINICAL DATA:  Fever EXAM: PORTABLE CHEST 1 VIEW COMPARISON:  01/14/2015 FINDINGS: Heart size mildly enlarged. Mild vascular congestion. Negative for edema or effusion. Prominent lung markings are similar to prior studies consistent with mild scarring. IMPRESSION: Mild congestive heart failure without significant edema or effusion. No definite pneumonia. Electronically Signed   By: Franchot Gallo M.D.   On: 02/03/2015 14:18    Scheduled Meds: . amLODipine  5 mg Oral Daily  . antiseptic oral rinse  7 mL Mouth Rinse BID  . aspirin EC  325 mg Oral Daily  . atorvastatin  5 mg Oral q1800  . heparin  5,000 Units Subcutaneous 3 times per day  . insulin aspart  0-9 Units Subcutaneous 6 times per day  . lisinopril  20 mg Oral QPM  . lithium citrate  78 mg Oral BID  . metoprolol  50 mg Oral BID  . mirtazapine  30 mg Oral QPM  . oxybutynin  5 mg Oral BID  . pantoprazole  80 mg Oral Daily  . piperacillin-tazobactam (ZOSYN)  IV  3.375 g Intravenous Q8H  . pregabalin  75 mg Oral BID  .  Vilazodone HCl  20 mg Oral Daily  . zolpidem  2.5 mg Oral QHS   Continuous Infusions: . sodium chloride 125 mL/hr at 02/04/15 0600   Antibiotics Given (last 72 hours)    Date/Time Action Medication Dose Rate   02/03/15 2317 Given   piperacillin-tazobactam (ZOSYN) IVPB 3.375 g 3.375 g 100 mL/hr   02/04/15 1100 Given   piperacillin-tazobactam (ZOSYN) IVPB 3.375 g 3.375 g 12.5 mL/hr      Principal Problem:   Cholangitis Active Problems:   Type 2 diabetes mellitus with hyperglycemia (HCC)   Chronic diastolic heart failure ([place])   Essential hypertension, malignant   Sepsis ([place])    Time spent: 39min    Hasani Diemer  [facility] Pager [phone]. If 7PM-7AM, please contact night-coverage at [email or web address], password [facility] 02/04/2015, 12:41 PM  LOS: 1 day

## 2015-02-04 NOTE — Consult Note (Signed)
[facility] Gastroenterology Consultation Note  Referring Provider: Dr. Domenic Polite [facility]) Primary Care Physician:  Irven Shelling, MD  Reason for Consultation:  Abdominal pain, fevers, elevated LFTs  HPI: [name] is a 81 y.o. female admitted with acute onset nausea, vomiting, fevers (104.4), epigastric abdominal pain.  Symptoms fairly quickly resolved once in hospital, and once given antibiotics and intravenous fluids.  She has no nausea, vomiting, or fevers now.  No abdominal pain now.  Had been feeling well until yesterday from GI perspective.  No hematemesis, melena, hematochezia.  No weight loss.  CT scan with chronic biliary dilatation, slight increase in chronic cystic pancreatic mass (evaluation with EUS and cyst aspiration in 2012 done).  LFTs elevated with normal lipase.   Past Medical History  Diagnosis Date  . Diabetes mellitus without complication ([place])   . Hypertension   . CKD (chronic kidney disease), stage III   . PVC (premature ventricular contraction)   . Hyperlipidemia   . GERD (gastroesophageal reflux disease)   . Osteopenia   . Chronic low back pain   . Postlaminectomy syndrome   . Right carpal tunnel syndrome   . Pernicious anemia   . Chronic interstitial cystitis   . Microhematuria   . Macular degeneration   . Chest pain     a. 07/2014 Neg MV, nl EF.    History reviewed. No pertinent past surgical history.  Prior to Admission medications   Medication Sig Start Date End Date Taking? Authorizing Provider  amLODipine (NORVASC) 5 MG tablet Take 1 tablet (5 mg total) by mouth daily. 11/21/14   Ripudeep Krystal Eaton, MD  aspirin EC 325 MG EC tablet Take 1 tablet (325 mg total) by mouth daily. 11/21/14   Ripudeep Krystal Eaton, MD  atorvastatin (LIPITOR) 10 MG tablet Take 5 mg by mouth every morning.     Historical Provider, MD  cholecalciferol (VITAMIN D) 1000 UNITS tablet Take 2,000 Units by mouth every morning.    Historical Provider, MD  conjugated estrogens (PREMARIN)  vaginal cream Place 1 Applicatorful vaginally as needed (burning).    Historical Provider, MD  Cyanocobalamin (B-12 COMPLIANCE INJECTION IJ) Inject as directed every 30 (thirty) days.    Historical Provider, MD  furosemide (LASIX) 20 MG tablet Take 2 tablets (40 mg total) by mouth daily. 11/24/14   Ripudeep Krystal Eaton, MD  glimepiride (AMARYL) 1 MG tablet Take 2 tablets (2 mg total) by mouth every morning. 01/17/15   Charlynne Cousins, MD  lisinopril (PRINIVIL,ZESTRIL) 20 MG tablet Take 1 tablet (20 mg total) by mouth every evening. 07/22/14   Maryann Mikhail, DO  lithium carbonate (LITHOBID) 300 MG CR tablet Take 150 mg by mouth every morning.    Historical Provider, MD  metoprolol (LOPRESSOR) 50 MG tablet Take 1 tablet (50 mg total) by mouth 2 (two) times daily. 07/22/14   Maryann Mikhail, DO  mirtazapine (REMERON) 30 MG tablet Take 30 mg by mouth every evening.    Historical Provider, MD  nitroGLYCERIN (NITROSTAT) 0.4 MG SL tablet Place 1 tablet (0.4 mg total) under the tongue every 5 (five) minutes as needed for chest pain. 07/22/14   Maryann Mikhail, DO  omeprazole (PRILOSEC) 40 MG capsule Take 40 mg by mouth every evening.     Historical Provider, MD  oxybutynin (DITROPAN) 5 MG tablet Take 5 mg by mouth 2 (two) times daily.     Historical Provider, MD  pregabalin (LYRICA) 75 MG capsule Take 75 mg by mouth 2 (two) times daily.  Historical Provider, MD  traMADol (ULTRAM) 50 MG tablet Take 1 tablet (50 mg total) by mouth 2 (two) times daily as needed (pain). Patient taking differently: Take 50 mg by mouth 2 (two) times daily.  07/22/14   Maryann Mikhail, DO  Vilazodone HCl 20 MG TABS Take 20 mg by mouth every morning.    Historical Provider, MD  zolpidem (AMBIEN) 5 MG tablet Take 2.5 mg by mouth at bedtime.     Historical Provider, MD    Current Facility-Administered Medications  Medication Dose Route Frequency Provider Last Rate Last Dose  . 0.9 %  sodium chloride infusion   Intravenous Continuous  Etta Quill, DO 125 mL/hr at 02/04/15 0600    . acetaminophen (TYLENOL) tablet 650 mg  650 mg Oral Q6H PRN Etta Quill, DO       Or  . acetaminophen (TYLENOL) suppository 650 mg  650 mg Rectal Q6H PRN Etta Quill, DO      . amLODipine (NORVASC) tablet 5 mg  5 mg Oral Daily Etta Quill, DO   5 mg at 02/03/15 2238  . antiseptic oral rinse (CPC / CETYLPYRIDINIUM CHLORIDE 0.05%) solution 7 mL  7 mL Mouth Rinse BID Etta Quill, DO   7 mL at 02/04/15 0200  . aspirin EC tablet 325 mg  325 mg Oral Daily Etta Quill, DO   325 mg at 02/03/15 2235  . atorvastatin (LIPITOR) tablet 5 mg  5 mg Oral q1800 Etta Quill, DO      . heparin injection 5,000 Units  5,000 Units Subcutaneous 3 times per day Etta Quill, DO   5,000 Units at 02/04/15 0557  . insulin aspart (novoLOG) injection 0-9 Units  0-9 Units Subcutaneous 6 times per day Etta Quill, DO   2 Units at 02/04/15 0434  . lisinopril (PRINIVIL,ZESTRIL) tablet 20 mg  20 mg Oral QPM Etta Quill, DO   20 mg at 02/03/15 2234  . lithium citrate 300 MG/5ML solution 78 mg  78 mg Oral BID Etta Quill, DO      . metoprolol tartrate (LOPRESSOR) tablet 50 mg  50 mg Oral BID Etta Quill, DO   50 mg at 02/03/15 2234  . mirtazapine (REMERON) tablet 30 mg  30 mg Oral QPM Etta Quill, DO   30 mg at 02/03/15 2234  . morphine 2 MG/ML injection 2-4 mg  2-4 mg Intravenous Q4H PRN Etta Quill, DO      . ondansetron [facility]) injection 4 mg  4 mg Intravenous Q6H PRN Etta Quill, DO      . oxybutynin (DITROPAN) tablet 5 mg  5 mg Oral BID Etta Quill, DO   5 mg at 02/03/15 2235  . pantoprazole (PROTONIX) EC tablet 80 mg  80 mg Oral Daily Etta Quill, DO   80 mg at 02/03/15 2234  . piperacillin-tazobactam (ZOSYN) IVPB 3.375 g  3.375 g Intravenous Q8H Rebecka Apley, [facility]      . pregabalin (LYRICA) capsule 75 mg  75 mg Oral BID Etta Quill, DO   75 mg at 02/03/15 2234  . Vilazodone HCl TABS 20 mg  20 mg Oral Daily  Etta Quill, DO      . zolpidem [facility]) tablet 2.5 mg  2.5 mg Oral QHS Etta Quill, DO   2.5 mg at 02/03/15 2235    Allergies as of 02/03/2015 - Review Complete 02/03/2015  Allergen Reaction Noted  .  Codeine Other (See Comments) 08/31/2013    Family History  Problem Relation Age of Onset  . Arthritis Mother   . Heart attack Father     Social History   Social History  . Marital Status: Married    Spouse Name: N/A  . Number of Children: N/A  . Years of Education: N/A   Occupational History  . Not on file.   Social History Main Topics  . Smoking status: Never Smoker   . Smokeless tobacco: Never Used  . Alcohol Use: No  . Drug Use: Not on file  . Sexual Activity: Not on file   Other Topics Concern  . Not on file   Social History Narrative    Review of Systems: Positive = bold Gen: Denies any fever, chills, rigors, night sweats, anorexia, fatigue, weakness, malaise, involuntary weight loss, and sleep disorder CV: Denies chest pain, angina, palpitations, syncope, orthopnea, PND, peripheral edema, and claudication. Resp: Denies dyspnea, cough, sputum, wheezing, coughing up blood. GI: Described in detail in HPI.    GU : Denies urinary burning, blood in urine, urinary frequency, urinary hesitancy, nocturnal urination, and urinary incontinence. MS: Denies joint pain or swelling.  Denies muscle weakness, cramps, atrophy.  Derm: Denies rash, itching, oral ulcerations, hives, unhealing ulcers.  Psych: Denies depression, anxiety, memory loss, suicidal ideation, hallucinations,  and confusion. Heme: Denies bruising, bleeding, and enlarged lymph nodes. Neuro:  Denies any headaches, dizziness, paresthesias. Endo:  Denies any problems with DM, thyroid, adrenal function.  Physical Exam: Vital signs in last 24 hours: Temp:  [98.8 F (37.1 C)-104.4 F (40.2 C)] 98.9 F (37.2 C) (12/02 0807) Pulse Rate:  [63-106] 67 (12/02 0807) Resp:  [17-46] 21 (12/02 0807) BP:  (112-205)/(36-92) 126/47 mmHg (12/02 0807) SpO2:  [88 %-98 %] 97 % (12/02 0807) Weight:  [74.844 kg (165 lb)-77.3 kg (170 lb 6.7 oz)] 77.3 kg (170 lb 6.7 oz) (12/01 2134) Last BM Date: 02/03/15 General:   Awake but somnolent, arousable and answers questions appropriately Head:  Normocephalic and atraumatic. Eyes:  Sclera mild icterus.   Conjunctiva pink. Ears:  Normal auditory acuity. Nose:  No deformity, discharge,  or lesions. Mouth:  No deformity or lesions.  Oropharynx pink but dry Neck:  Supple; no masses or thyromegaly. Lungs:  Clear throughout to auscultation.   No wheezes, crackles, or rhonchi. No acute distress. Heart:  Regular rate and rhythm; no murmurs, clicks, rubs,  or gallops. Abdomen:  Soft, protuberant, non-tender, non-distended.  Hypoactive but present bowel sounds. No masses, hepatosplenomegaly or hernias noted. No peritonitis Msk:  Symmetrical without gross deformities. Normal posture. Pulses:  Normal pulses noted. Extremities:  Without clubbing or edema. Neurologic:  Somnolent but arousable; can answer questions;  Diffusely weak, otherwise grossly normal neurologically. Skin:  Scattered ecchymoses; Psych:  Alert and cooperative. Normal mood and affect.   Lab Results:  Recent Labs  02/03/15 1335 02/04/15 0428  WBC 8.3 13.8*  HGB 13.3 10.7*  HCT 42.2 34.5*  PLT 199 160   BMET  Recent Labs  02/03/15 1335 02/04/15 0428  NA 143 143  K 3.6 3.1*  CL 105 106  CO2 20* 26  GLUCOSE 261* 193*  BUN 11 12  CREATININE 1.19* 1.29*  CALCIUM 9.3 8.3*   LFT  Recent Labs  02/04/15 0428  PROT 5.0*  ALBUMIN 2.6*  AST 276*  ALT 189*  ALKPHOS 171*  BILITOT 3.2*   PT/INR No results for input(s): LABPROT, INR in the last 72 hours.  Studies/Results: Ct Abdomen  Pelvis W Contrast  02/03/2015  CLINICAL DATA:  Fever, abdominal pain, abnormal LFTs EXAM: CT ABDOMEN AND PELVIS WITH CONTRAST TECHNIQUE: Multidetector CT imaging of the abdomen and pelvis was performed  using the standard protocol following bolus administration of intravenous contrast. CONTRAST:  71mL OMNIPAQUE IOHEXOL 300 MG/ML  SOLN COMPARISON:  03/23/2010 FINDINGS: Lower chest:  Mild bibasilar chronic interstitial disease. Hepatobiliary: No focal hepatic mass. Intrahepatic biliary ductal dilatation. Dilated common bile duct to the level of the pancreatic head. Mild pericholecystic fluid or wall thickening. Pancreas: 2.8 X 2.4 cm cystic pancreatic head mass with a punctate mural calcification. Spleen: Normal. Adrenals/Urinary Tract: Normal adrenal glands. 10 mm hypodense, fluid attenuating left renal mass most consistent with a cyst. Normal right kidney. No obstructive uropathy or urolithiasis. Unremarkable bladder. Stomach/Bowel: Duo dual diverticulum is noted. No bowel wall thickening or dilatation. Diverticulosis of the descending and sigmoid colon without evidence of diverticulitis. Rectal fecal impaction. No pneumatosis, pneumoperitoneum or portal venous gas. No abdominal or pelvic free fluid. Small fat containing umbilical hernia. Vascular/Lymphatic: Normal caliber abdominal aorta with atherosclerosis. No abdominal or pelvic lymphadenopathy. Reproductive: Normal uterus.  No adnexal mass. Other: No fluid collection or hematoma. Musculoskeletal: No acute osseous abnormality. No aggressive lytic or sclerotic osseous lesion. Left hip arthroplasty. Degenerative disc disease throughout the lumbar spine with diffuse facet arthropathy. IMPRESSION: 1. No acute abdominal or pelvic pathology. 2. 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 3. Intrahepatic and extrahepatic biliary ductal dilatation with abrupt narrowing at the pancreatic head. No focal ampullary mass or choledocholithiasis is identified. 4. Mild pericholecystic fluid or wall thickening. This appearance can be seen with cholecystitis. If there is further  clinical concern, recommend a right upper quadrant ultrasound. Electronically Signed   By: Kathreen Devoid   On: 02/03/2015 17:03   Dg Chest Portable 1 View  02/03/2015  CLINICAL DATA:  Fever EXAM: PORTABLE CHEST 1 VIEW COMPARISON:  01/14/2015 FINDINGS: Heart size mildly enlarged. Mild vascular congestion. Negative for edema or effusion. Prominent lung markings are similar to prior studies consistent with mild scarring. IMPRESSION: Mild congestive heart failure without significant edema or effusion. No definite pneumonia. Electronically Signed   By: Franchot Gallo [name].D.   On: 02/03/2015 14:18    Impression:  1.  Nausea and vomiting, resolved. 2.  Epigastric abdominal pain, resolved. 3.  Fevers, resolved. 4.  Elevated LFTs.   5.  Chronic pancreatic cystic lesion.  Aspiration 2012 most consistent with pseudocyst versus low-grade mucinous cystadenoma.  Patient declined office follow-up for further discussion after her EUS procedure in 2012. 6.  Chronic biliary dilatation dating back over 10 years, historically (until this admission) associated with normal LFTs. 7.  Overall constellation of findings worrisome for cholangitis.  Unclear whether from passed gallstone (patient has large periampullary diverticulum which increases risk of choledocholithiasis) or from extrinsic compression from cystic lesion in head of pancreas, but the degree of enlargement of her pancreatic cyst seems disproportionately low compared to her significant elevation of LFTs.  Moreover, her abrupt symptom onset of pain, nausea, vomiting and fevers, all of which have quickly regressed, is more consistent with passed bile duct stone (or ball-valving stone).  Plan:  1.  Clear liquids today is ok. 2.  Follow LFTs and fever curve. 3.  Continue Zosyn antibiotics and follow cultures. 4.  Continue hydration and supportive care. 5.  Consider MRCP in 1-2 days to  better assess biliary tree (would hold off for now given her chronic  borderline renal insufficiency and recent dye load from her CT scan yesterday and given her profound clinical improvement). 6.  [facility] will follow.   LOS: 1 day   [name]  02/04/2015, 9:02 AM  Pager [phone] If no answer or after 5 PM call [phone]

## 2015-02-04 NOTE — Progress Notes (Signed)
Utilization review completed. Saprina Chuong, RN, BSN. 

## 2015-02-05 ENCOUNTER — Inpatient Hospital Stay (HOSPITAL_COMMUNITY): Payer: Medicare Other

## 2015-02-05 LAB — COMPREHENSIVE METABOLIC PANEL
ALBUMIN: 2.5 g/dL — AB (ref 3.5–5.0)
ALT: 115 U/L — AB (ref 14–54)
AST: 90 U/L — AB (ref 15–41)
Alkaline Phosphatase: 144 U/L — ABNORMAL HIGH (ref 38–126)
Anion gap: 9 (ref 5–15)
BUN: 10 mg/dL (ref 6–20)
CHLORIDE: 112 mmol/L — AB (ref 101–111)
CO2: 25 mmol/L (ref 22–32)
CREATININE: 1.02 mg/dL — AB (ref 0.44–1.00)
Calcium: 8.4 mg/dL — ABNORMAL LOW (ref 8.9–10.3)
GFR calc Af Amer: 57 mL/min — ABNORMAL LOW (ref >60)
GFR calc non Af Amer: 49 mL/min — ABNORMAL LOW (ref >60)
GLUCOSE: 112 mg/dL — AB (ref 65–99)
Potassium: 3.5 mmol/L (ref 3.5–5.1)
SODIUM: 146 mmol/L — AB (ref 135–145)
Total Bilirubin: 1.7 mg/dL — ABNORMAL HIGH (ref 0.3–1.2)
Total Protein: 5.6 g/dL — ABNORMAL LOW (ref 6.5–8.1)

## 2015-02-05 LAB — GLUCOSE, CAPILLARY
GLUCOSE-CAPILLARY: 241 mg/dL — AB (ref 65–99)
Glucose-Capillary: 82 mg/dL (ref 65–99)
Glucose-Capillary: 100 mg/dL — ABNORMAL HIGH (ref 65–99)
Glucose-Capillary: 168 mg/dL — ABNORMAL HIGH (ref 65–99)
Glucose-Capillary: 147 mg/dL — ABNORMAL HIGH (ref 65–99)
Glucose-Capillary: 143 mg/dL — ABNORMAL HIGH (ref 65–99)
Glucose-Capillary: 112 mg/dL — ABNORMAL HIGH (ref 65–99)

## 2015-02-05 LAB — CEA: CEA: 2.5 ng/mL (ref 0.0–4.7)

## 2015-02-05 LAB — CBC
HCT: 33.5 % — ABNORMAL LOW (ref 36.0–46.0)
Hemoglobin: 10.3 g/dL — ABNORMAL LOW (ref 12.0–15.0)
MCH: 28.1 pg (ref 26.0–34.0)
MCHC: 30.7 g/dL (ref 30.0–36.0)
MCV: 91.3 fL (ref 78.0–100.0)
PLATELETS: 126 10*3/uL — AB (ref 150–400)
RBC: 3.67 MIL/uL — AB (ref 3.87–5.11)
RDW: 13.3 % (ref 11.5–15.5)
WBC: 7.5 10*3/uL (ref 4.0–10.5)

## 2015-02-05 LAB — CANCER ANTIGEN 19-9: CA 19 9: 88 U/mL — AB (ref 0–35)

## 2015-02-05 MED ORDER — GADOBENATE DIMEGLUMINE 529 MG/ML IV SOLN
20.0000 mL | Freq: Once | INTRAVENOUS | Status: AC | PRN
  Administered 2015-02-05: 20 mL via INTRAVENOUS

## 2015-02-05 NOTE — Progress Notes (Signed)
Subjective: No pain wants OOB  Objective: Vital signs in last 24 hours: Temp:  [98.8 F (37.1 C)-99.3 F (37.4 C)] 98.8 F (37.1 C) (12/03 0700) Pulse Rate:  [65-82] 82 (12/03 0700) Resp:  [0-27] 27 (12/03 0700) BP: (105-162)/(47-68) 162/64 mmHg (12/03 0700) SpO2:  [92 %-100 %] 92 % (12/03 0700) Last BM Date: 02/04/15  Intake/Output from previous day: 12/02 0701 - 12/03 0700 In: 2809 [P.O.:2309; I.V.:425; IV Piggyback:75] Out: -  Intake/Output this shift:    GI: soft NT ND no mass   Lab Results:   Recent Labs  02/04/15 0428 02/05/15 0408  WBC 13.8* 7.5  HGB 10.7* 10.3*  HCT 34.5* 33.5*  PLT 160 126*   BMET  Recent Labs  02/04/15 0428 02/05/15 0408  NA 143 146*  K 3.1* 3.5  CL 106 112*  CO2 26 25  GLUCOSE 193* 112*  BUN 12 10  CREATININE 1.29* 1.02*  CALCIUM 8.3* 8.4*   PT/INR No results for input(s): LABPROT, INR in the last 72 hours. ABG No results for input(s): PHART, HCO3 in the last 72 hours.  Invalid input(s): PCO2, PO2  Studies/Results: Ct Abdomen Pelvis W Contrast  02/03/2015  CLINICAL DATA:  Fever, abdominal pain, abnormal LFTs EXAM: CT ABDOMEN AND PELVIS WITH CONTRAST TECHNIQUE: Multidetector CT imaging of the abdomen and pelvis was performed using the standard protocol following bolus administration of intravenous contrast. CONTRAST:  72mL OMNIPAQUE IOHEXOL 300 MG/ML  SOLN COMPARISON:  03/23/2010 FINDINGS: Lower chest:  Mild bibasilar chronic interstitial disease. Hepatobiliary: No focal hepatic mass. Intrahepatic biliary ductal dilatation. Dilated common bile duct to the level of the pancreatic head. Mild pericholecystic fluid or wall thickening. Pancreas: 2.8 X 2.4 cm cystic pancreatic head mass with a punctate mural calcification. Spleen: Normal. Adrenals/Urinary Tract: Normal adrenal glands. 10 mm hypodense, fluid attenuating left renal mass most consistent with a cyst. Normal right kidney. No obstructive uropathy or urolithiasis.  Unremarkable bladder. Stomach/Bowel: Duo dual diverticulum is noted. No bowel wall thickening or dilatation. Diverticulosis of the descending and sigmoid colon without evidence of diverticulitis. Rectal fecal impaction. No pneumatosis, pneumoperitoneum or portal venous gas. No abdominal or pelvic free fluid. Small fat containing umbilical hernia. Vascular/Lymphatic: Normal caliber abdominal aorta with atherosclerosis. No abdominal or pelvic lymphadenopathy. Reproductive: Normal uterus.  No adnexal mass. Other: No fluid collection or hematoma. Musculoskeletal: No acute osseous abnormality. No aggressive lytic or sclerotic osseous lesion. Left hip arthroplasty. Degenerative disc disease throughout the lumbar spine with diffuse facet arthropathy. IMPRESSION: 1. No acute abdominal or pelvic pathology. 2. 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 3. Intrahepatic and extrahepatic biliary ductal dilatation with abrupt narrowing at the pancreatic head. No focal ampullary mass or choledocholithiasis is identified. 4. Mild pericholecystic fluid or wall thickening. This appearance can be seen with cholecystitis. If there is further clinical concern, recommend a right upper quadrant ultrasound. Electronically Signed   By: Kathreen Devoid   On: 02/03/2015 17:03   Dg Chest Portable 1 View  02/03/2015  CLINICAL DATA:  Fever EXAM: PORTABLE CHEST 1 VIEW COMPARISON:  01/14/2015 FINDINGS: Heart size mildly enlarged. Mild vascular congestion. Negative for edema or effusion. Prominent lung markings are similar to prior studies consistent with mild scarring. IMPRESSION: Mild congestive heart failure without significant edema or effusion. No definite pneumonia. Electronically Signed   By: Franchot Gallo M.D.   On: 02/03/2015 14:18  Anti-infectives: Anti-infectives    Start     Dose/Rate Route Frequency Ordered Stop   02/04/15  1430  vancomycin (VANCOCIN) IVPB 1000 mg/200 mL premix  Status:  Discontinued     1,000 mg 200 mL/hr over 60 Minutes Intravenous Every 24 hours 02/03/15 1444 02/03/15 2015   02/04/15 0900  piperacillin-tazobactam (ZOSYN) IVPB 3.375 g     3.375 g 12.5 mL/hr over 240 Minutes Intravenous Every 8 hours 02/04/15 0841     02/03/15 2030  piperacillin-tazobactam (ZOSYN) IVPB 3.375 g     3.375 g 100 mL/hr over 30 Minutes Intravenous  Once 02/03/15 2019 02/03/15 2347   02/03/15 1400  vancomycin (VANCOCIN) 1,500 mg in sodium chloride 0.9 % 500 mL IVPB     1,500 mg 250 mL/hr over 120 Minutes Intravenous STAT 02/03/15 1349 02/03/15 2007   02/03/15 1345  piperacillin-tazobactam (ZOSYN) IVPB 3.375 g     3.375 g 100 mL/hr over 30 Minutes Intravenous  Once 02/03/15 1334 02/03/15 1515      Assessment/Plan: Pancreatic cystic head mss      Patient Active Problem List   Diagnosis Date Noted  . Cholangitis 02/03/2015  . Sepsis ([place]) 02/03/2015  . Essential hypertension, malignant 01/14/2015  . CHF (congestive heart failure) ([place]) 01/14/2015  . Acute on chronic diastolic CHF (congestive heart failure), NYHA class 1 ([place]) 01/13/2015  . Atrial flutter, paroxysmal ([place]) 01/13/2015  . Acute respiratory failure with hypoxia ([place]) 01/13/2015  . Lingular mass 01/13/2015  . Ulcer of right heel ([place]) 01/13/2015  . Acute diastolic heart failure, NYHA class 1 ([place]) 01/13/2015  . Hypertensive emergency   . Acute encephalopathy 11/19/2014  . UTI (urinary tract infection) 11/19/2014  . TIA (transient ischemic attack) 11/18/2014  . Obesity (BMI 35.0-39.9 without comorbidity) ([place]) 11/18/2014  . ALT (SGPT) level raised 11/18/2014  . Chronic diastolic heart failure ([place]) 07/20/2014  . CKD (chronic kidney disease), stage III   . Uncontrolled hypertension 07/18/2014  . Type 2 diabetes mellitus with hyperglycemia ([place]) 07/18/2014  . Hyperlipidemia 07/18/2014   Out of bed ok  Need further work up Elevated CA  19 - worrisome  For malignant process ERCP would be helpful in this setting Can feed Will check back Monday   LOS: 2 days    [name] A. 02/05/2015

## 2015-02-05 NOTE — Progress Notes (Signed)
CRITICAL VALUE ALERT  Critical value received:  Positive blood cultures, gram negative rods in aerobic bottle  Date of notification:  02/05/2015   Time of notification:  5:53 AM   Critical value read back: yes  Nurse who received alert:  Reatha Armour  MD notified (1st page):    Time of first page:  5:53 AM   MD notified (2nd page):  Time of second page:  Responding MD:  Kathline Magic  Time MD responded:  5:57 AM

## 2015-02-05 NOTE — Progress Notes (Signed)
[facility] PROGRESS NOTE  [name] [id number] DOB: [birth year] DOA: 02/03/2015 PCP: Irven Shelling, MD  Assessment/Plan: 1. Cholangitis -Clinically suspect passed stone however she also has a cystic lesion in the pancreatic head, which could be causing extrinsic compression, Ca 19-9 elevated -started on clears, GI and CCS following -continue  IV Zosyn, GI consulted -will order MRCP today -has gram negative bacteremia due to Cholangitis, await speciation  2. 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 Chronic diastolic CHF -Clinically euvolemic at this time, stop IVF, holding Lasix  4. Diabetes mellitus -Hold Amaryl, sliding scale insulin for now  5. History of CVA with hemiplegia/Aphasia -ASA on hold  6. CKD3 -stable  DVT proph: Hep SQ  Consultants: Gi CCS  HPI/Subjective: Feels ok, no further abd pain, breathing ok, wondering why shes been getting admitted frequently  Objective: Filed Vitals:   02/05/15 0400 02/05/15 0700  BP: 139/63 162/64  Pulse:  82  Temp:  98.8 F (37.1 C)  Resp:  27    Intake/Output Summary (Last 24 hours) at 02/05/15 1023 Last data filed at 02/05/15 0900  Gross per 24 hour  Intake   2390 ml  Output      0 ml  Net   2390 ml   Filed Weights   02/03/15 1324 02/03/15 2134  Weight: 74.844 kg (165 lb) 77.3 kg (170 lb 6.7 oz)    Exam:   General:  AAOx3, dysarthric  Cardiovascular: S1S2/RRR  Respiratory: CTAB  Abdomen: soft, NT, BS present  Musculoskeletal: no edema   L hemiplegia   Data Reviewed: Basic Metabolic Panel:  Recent Labs Lab 02/03/15 1335 02/04/15 0428 02/05/15 0408  NA 143 143 146*  K 3.6 3.1* 3.5  CL 105 106 112*  CO2 20* 26 25  GLUCOSE 261* 193* 112*  BUN 11 12 10   CREATININE 1.19* 1.29* 1.02*  CALCIUM 9.3 8.3* 8.4*   Liver Function Tests:  Recent Labs Lab  02/03/15 1335 02/04/15 0428 02/05/15 0408  AST 441* 276* 90*  ALT 136* 189* 115*  ALKPHOS 233* 171* 144*  BILITOT 3.0* 3.2* 1.7*  PROT 6.6 5.0* 5.6*  ALBUMIN 3.5 2.6* 2.5*    Recent Labs Lab 02/03/15 1702  LIPASE 32   No results for input(s): AMMONIA in the last 168 hours. CBC:  Recent Labs Lab 02/03/15 1335 02/04/15 0428 02/05/15 0408  WBC 8.3 13.8* 7.5  NEUTROABS 7.9*  --   --   HGB 13.3 10.7* 10.3*  HCT 42.2 34.5* 33.5*  MCV 90.8 90.1 91.3  PLT 199 160 126*   Cardiac Enzymes:  Recent Labs Lab 02/03/15 1335  TROPONINI <0.03   BNP (last 3 results)  Recent Labs  07/18/14 1216 01/13/15 0926 01/14/15 1040  BNP 250.6* 173.8* 45.0    ProBNP (last 3 results) No results for input(s): PROBNP in the last 8760 hours.  CBG:  Recent Labs Lab 02/04/15 1633 02/04/15 2016 02/04/15 2323 02/05/15 0344 02/05/15 0725  GLUCAP 115* 154* 82 100* 168*    Recent Results (from the past 240 hour(s))  Blood culture (routine x 2)     Status: None (Preliminary result)   Collection Time: 02/03/15  1:35 PM  Result Value Ref Range Status   Specimen Description BLOOD RIGHT ARM  Final   Special Requests BOTTLES DRAWN AEROBIC AND ANAEROBIC 5CC  Final   Culture  Setup Time   Final  GRAM NEGATIVE RODS IN BOTH AEROBIC AND ANAEROBIC BOTTLES CRITICAL RESULT CALLED TO, READ BACK BY AND VERIFIED WITH: B REAP @0202   02/04/15 MKELLY    Culture GRAM NEGATIVE RODS  Final   Report Status PENDING  Incomplete  Blood culture (routine x 2)     Status: None (Preliminary result)   Collection Time: 02/03/15  2:10 PM  Result Value Ref Range Status   Specimen Description BLOOD LEFT HAND  Final   Special Requests BOTTLES DRAWN AEROBIC AND ANAEROBIC 5CC  Final   Culture  Setup Time   Final    GRAM NEGATIVE RODS AEROBIC BOTTLE ONLY CRITICAL RESULT CALLED TO, READ BACK BY AND VERIFIED WITH: A PETTIFORD @0552  02/05/15 MKELLY    Culture NO GROWTH < 24 HOURS  Final   Report Status PENDING   Incomplete  MRSA PCR Screening     Status: None   Collection Time: 02/03/15 10:13 PM  Result Value Ref Range Status   MRSA by PCR NEGATIVE NEGATIVE Final    Comment:        The GeneXpert MRSA Assay (FDA approved for NASAL specimens only), is one component of a comprehensive MRSA colonization surveillance program. It is not intended to diagnose MRSA infection nor to guide or monitor treatment for MRSA infections.      Studies: Ct Abdomen Pelvis W Contrast  02/03/2015  CLINICAL DATA:  Fever, abdominal pain, abnormal LFTs EXAM: CT ABDOMEN AND PELVIS WITH CONTRAST TECHNIQUE: Multidetector CT imaging of the abdomen and pelvis was performed using the standard protocol following bolus administration of intravenous contrast. CONTRAST:  61mL OMNIPAQUE IOHEXOL 300 MG/ML  SOLN COMPARISON:  03/23/2010 FINDINGS: Lower chest:  Mild bibasilar chronic interstitial disease. Hepatobiliary: No focal hepatic mass. Intrahepatic biliary ductal dilatation. Dilated common bile duct to the level of the pancreatic head. Mild pericholecystic fluid or wall thickening. Pancreas: 2.8 X 2.4 cm cystic pancreatic head mass with a punctate mural calcification. Spleen: Normal. Adrenals/Urinary Tract: Normal adrenal glands. 10 mm hypodense, fluid attenuating left renal mass most consistent with a cyst. Normal right kidney. No obstructive uropathy or urolithiasis. Unremarkable bladder. Stomach/Bowel: Duo dual diverticulum is noted. No bowel wall thickening or dilatation. Diverticulosis of the descending and sigmoid colon without evidence of diverticulitis. Rectal fecal impaction. No pneumatosis, pneumoperitoneum or portal venous gas. No abdominal or pelvic free fluid. Small fat containing umbilical hernia. Vascular/Lymphatic: Normal caliber abdominal aorta with atherosclerosis. No abdominal or pelvic lymphadenopathy. Reproductive: Normal uterus.  No adnexal mass. Other: No fluid collection or hematoma. Musculoskeletal: No acute  osseous abnormality. No aggressive lytic or sclerotic osseous lesion. Left hip arthroplasty. Degenerative disc disease throughout the lumbar spine with diffuse facet arthropathy. IMPRESSION: 1. No acute abdominal or pelvic pathology. 2. 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 3. Intrahepatic and extrahepatic biliary ductal dilatation with abrupt narrowing at the pancreatic head. No focal ampullary mass or choledocholithiasis is identified. 4. Mild pericholecystic fluid or wall thickening. This appearance can be seen with cholecystitis. If there is further clinical concern, recommend a right upper quadrant ultrasound. Electronically Signed   By: Kathreen Devoid   On: 02/03/2015 17:03   Dg Chest Portable 1 View  02/03/2015  CLINICAL DATA:  Fever EXAM: PORTABLE CHEST 1 VIEW COMPARISON:  01/14/2015 FINDINGS: Heart size mildly enlarged. Mild vascular congestion. Negative for edema or effusion. Prominent lung markings are similar to prior studies consistent with mild  scarring. IMPRESSION: Mild congestive heart failure without significant edema or effusion. No definite pneumonia. Electronically Signed   By: Franchot Gallo M.D.   On: 02/03/2015 14:18    Scheduled Meds: . amLODipine  5 mg Oral Daily  . antiseptic oral rinse  7 mL Mouth Rinse BID  . aspirin EC  325 mg Oral Daily  . atorvastatin  5 mg Oral q1800  . heparin  5,000 Units Subcutaneous 3 times per day  . insulin aspart  0-9 Units Subcutaneous 6 times per day  . lithium citrate  78 mg Oral BID  . metoprolol  50 mg Oral BID  . mirtazapine  30 mg Oral QPM  . oxybutynin  5 mg Oral BID  . pantoprazole  80 mg Oral Daily  . piperacillin-tazobactam (ZOSYN)  IV  3.375 g Intravenous Q8H  . pregabalin  75 mg Oral BID  . Vilazodone HCl  20 mg Oral Daily  . zolpidem  2.5 mg Oral QHS   Continuous Infusions: . sodium chloride 10 mL/hr at 02/05/15 0854    Antibiotics Given (last 72 hours)    Date/Time Action Medication Dose Rate   02/03/15 2317 Given   piperacillin-tazobactam (ZOSYN) IVPB 3.375 g 3.375 g 100 mL/hr   02/04/15 1100 Given   piperacillin-tazobactam (ZOSYN) IVPB 3.375 g 3.375 g 12.5 mL/hr   02/04/15 1826 Given   piperacillin-tazobactam (ZOSYN) IVPB 3.375 g 3.375 g 12.5 mL/hr   02/05/15 0100 Given   piperacillin-tazobactam (ZOSYN) IVPB 3.375 g 3.375 g 12.5 mL/hr   02/05/15 0900 Given   piperacillin-tazobactam (ZOSYN) IVPB 3.375 g 3.375 g 12.5 mL/hr      Principal Problem:   Cholangitis Active Problems:   Type 2 diabetes mellitus with hyperglycemia (H[facility])   Chronic diastolic heart failure ([place])   Essential hypertension, malignant   Sepsis ([place])    Time spent: 58min    Cameran Ahmed  [facility] Pager [phone]. If 7PM-7AM, please contact night-coverage at [email or web address], password [facility] 02/05/2015, 10:23 AM  LOS: 2 days

## 2015-02-05 NOTE — Progress Notes (Signed)
[name] 10:49 AM  Subjective: Patient doing well without any GI complaints and wants to eat and go home and her hospital computer chart was reviewed and her case discussed with my partner Dr. Paulita Fujita  Objective: Vital signs stable afebrile no acute distress abdomen is soft nontender labs improved  Assessment: Cholangitis questionable CBD stones  Plan: Okay to advance diet and proceed with MRCP  [facility]  Pager [phone] After 5PM or if no answer call [phone]

## 2015-02-06 ENCOUNTER — Inpatient Hospital Stay (HOSPITAL_COMMUNITY): Payer: Medicare Other

## 2015-02-06 LAB — CULTURE, BLOOD (ROUTINE X 2)

## 2015-02-06 LAB — COMPREHENSIVE METABOLIC PANEL
ALT: 88 U/L — AB (ref 14–54)
AST: 40 U/L (ref 15–41)
Albumin: 3 g/dL — ABNORMAL LOW (ref 3.5–5.0)
Alkaline Phosphatase: 152 U/L — ABNORMAL HIGH (ref 38–126)
Anion gap: 10 (ref 5–15)
BUN: 9 mg/dL (ref 6–20)
CHLORIDE: 108 mmol/L (ref 101–111)
CO2: 25 mmol/L (ref 22–32)
CREATININE: 1.1 mg/dL — AB (ref 0.44–1.00)
Calcium: 9.1 mg/dL (ref 8.9–10.3)
GFR calc non Af Amer: 45 mL/min — ABNORMAL LOW (ref >60)
GFR, EST AFRICAN AMERICAN: 52 mL/min — AB (ref >60)
Glucose, Bld: 207 mg/dL — ABNORMAL HIGH (ref 65–99)
Potassium: 3.5 mmol/L (ref 3.5–5.1)
SODIUM: 143 mmol/L (ref 135–145)
Total Bilirubin: 1.3 mg/dL — ABNORMAL HIGH (ref 0.3–1.2)
Total Protein: 6.5 g/dL (ref 6.5–8.1)

## 2015-02-06 LAB — CBC
HCT: 38.3 % (ref 36.0–46.0)
HEMOGLOBIN: 12 g/dL (ref 12.0–15.0)
MCH: 28 pg (ref 26.0–34.0)
MCHC: 31.3 g/dL (ref 30.0–36.0)
MCV: 89.3 fL (ref 78.0–100.0)
Platelets: 139 10*3/uL — ABNORMAL LOW (ref 150–400)
RBC: 4.29 MIL/uL (ref 3.87–5.11)
RDW: 12.9 % (ref 11.5–15.5)
WBC: 7.4 10*3/uL (ref 4.0–10.5)

## 2015-02-06 LAB — GLUCOSE, CAPILLARY
GLUCOSE-CAPILLARY: 180 mg/dL — AB (ref 65–99)
GLUCOSE-CAPILLARY: 187 mg/dL — AB (ref 65–99)
Glucose-Capillary: 188 mg/dL — ABNORMAL HIGH (ref 65–99)
Glucose-Capillary: 220 mg/dL — ABNORMAL HIGH (ref 65–99)
Glucose-Capillary: 247 mg/dL — ABNORMAL HIGH (ref 65–99)
Glucose-Capillary: 288 mg/dL — ABNORMAL HIGH (ref 65–99)

## 2015-02-06 MED ORDER — MORPHINE SULFATE (PF) 2 MG/ML IV SOLN
1.0000 mg | INTRAVENOUS | Status: DC | PRN
  Administered 2015-02-08: 1 mg via INTRAVENOUS
  Filled 2015-02-06: qty 1

## 2015-02-06 MED ORDER — HYDRALAZINE HCL 20 MG/ML IJ SOLN
10.0000 mg | Freq: Four times a day (QID) | INTRAMUSCULAR | Status: DC | PRN
  Administered 2015-02-06: 10 mg via INTRAVENOUS
  Filled 2015-02-06: qty 1

## 2015-02-06 MED ORDER — FUROSEMIDE 10 MG/ML IJ SOLN
40.0000 mg | Freq: Four times a day (QID) | INTRAMUSCULAR | Status: AC
  Administered 2015-02-06 (×2): 40 mg via INTRAVENOUS
  Filled 2015-02-06 (×2): qty 4

## 2015-02-06 MED ORDER — ALBUTEROL SULFATE (2.5 MG/3ML) 0.083% IN NEBU
2.5000 mg | INHALATION_SOLUTION | RESPIRATORY_TRACT | Status: DC | PRN
  Administered 2015-02-06: 2.5 mg via RESPIRATORY_TRACT
  Filled 2015-02-06: qty 3

## 2015-02-06 MED ORDER — POTASSIUM CHLORIDE CRYS ER 20 MEQ PO TBCR
40.0000 meq | EXTENDED_RELEASE_TABLET | Freq: Two times a day (BID) | ORAL | Status: AC
  Administered 2015-02-06 (×2): 40 meq via ORAL
  Filled 2015-02-06 (×2): qty 2

## 2015-02-06 NOTE — Progress Notes (Signed)
[name] 10:24 AM  Subjective: Patient doing fine from a GI standpoint but having increased shortness of breath this morning but no abdominal pain but no appetite due to her breathing  Objective: Vital signs stable afebrile exam pertinent for her abdomen being soft nontender labs improved MRCP noted worrisome for possible cholecystitis  Assessment: Improved liver tests and GI symptoms  Plan: Consider cholecystectomy and Intra-Op cholangiogram or even HIDA scan to evaluate possible cholecystitis and I am not concerned about slightly elevated CA 19 since with pancreatic cancer the level is usually in the thousands range but would proceed with ERCP if a second attack occurs and could consider repeat EUS as an outpatient or at least following her CA 19 level but MRI is not worrisome for pancreatic cancer either  [facility]  Pager [phone] After 5PM or if no answer call [phone]

## 2015-02-06 NOTE — Progress Notes (Signed)
ANTIBIOTIC CONSULT NOTE - FOLLOW UP  Pharmacy Consult for Zosyn Indication: bacteremia  Allergies  Allergen Reactions  . Codeine Other (See Comments)    slurred speech    Patient Measurements: Height: 5\' 4"  (162.6 cm) Weight: 177 lb 0.5 oz (80.3 kg) IBW/kg (Calculated) : 54.7  Vital Signs: Temp: 98.6 F (37 C) (12/04 1654) Temp Source: Oral (12/04 1654) BP: 164/59 mmHg (12/04 1654) Pulse Rate: 76 (12/04 1654) Intake/Output from previous day: 12/03 0701 - 12/04 0700 In: 300 [P.O.:300] Out: 250 [Urine:250] Intake/Output from this shift: Total I/O In: 1788 [P.O.:1440; I.V.:298; IV Piggyback:50] Out: 200 [Urine:200]  Labs:  Recent Labs  02/04/15 0428 02/05/15 0408 02/06/15 0614 02/06/15 0920  WBC 13.8* 7.5  --  7.4  HGB 10.7* 10.3*  --  12.0  PLT 160 126*  --  139*  CREATININE 1.29* 1.02* 1.10*  --    Estimated Creatinine Clearance: 39.7 mL/min (by C-G formula based on Cr of 1.1).  Microbiology: Recent Results (from the past 720 hour(s))  Blood culture (routine x 2)     Status: None   Collection Time: 02/03/15  1:35 PM  Result Value Ref Range Status   Specimen Description BLOOD RIGHT ARM  Final   Special Requests BOTTLES DRAWN AEROBIC AND ANAEROBIC 5CC  Final   Culture  Setup Time   Final    GRAM NEGATIVE RODS IN BOTH AEROBIC AND ANAEROBIC BOTTLES CRITICAL RESULT CALLED TO, READ BACK BY AND VERIFIED WITH: B REAP @0202   02/04/15 MKELLY    Culture ENTEROBACTER CLOACAE  Final   Report Status 02/06/2015 FINAL  Final   Organism ID, Bacteria ENTEROBACTER CLOACAE  Final      Susceptibility   Enterobacter cloacae - MIC*    CEFAZOLIN <=4 RESISTANT Resistant     CEFEPIME <=1 SENSITIVE Sensitive     CEFTAZIDIME <=1 SENSITIVE Sensitive     CEFTRIAXONE <=1 SENSITIVE Sensitive     CIPROFLOXACIN <=0.25 SENSITIVE Sensitive     GENTAMICIN <=1 SENSITIVE Sensitive     IMIPENEM <=0.25 SENSITIVE Sensitive     TRIMETH/SULFA <=20 SENSITIVE Sensitive     PIP/TAZO <=4  SENSITIVE Sensitive     * ENTEROBACTER CLOACAE  Blood culture (routine x 2)     Status: None (Preliminary result)   Collection Time: 02/03/15  2:10 PM  Result Value Ref Range Status   Specimen Description BLOOD LEFT HAND  Final   Special Requests BOTTLES DRAWN AEROBIC AND ANAEROBIC 5CC  Final   Culture  Setup Time   Final    GRAM NEGATIVE RODS AEROBIC BOTTLE ONLY CRITICAL RESULT CALLED TO, READ BACK BY AND VERIFIED WITH: A PETTIFORD @0552  02/05/15 MKELLY    Culture GRAM NEGATIVE RODS  Final   Report Status PENDING  Incomplete  MRSA PCR Screening     Status: None   Collection Time: 02/03/15 10:13 PM  Result Value Ref Range Status   MRSA by PCR NEGATIVE NEGATIVE Final    Comment:        The GeneXpert MRSA Assay (FDA approved for NASAL specimens only), is one component of a comprehensive MRSA colonization surveillance program. It is not intended to diagnose MRSA infection nor to guide or monitor treatment for MRSA infections.    Assessment:   Day # 4 Zosyn.  1 of 2 blood cultures grew Enterobacter, sensitive to Zosyn. Expect same from 2nd culture.  Also with cholangitis.  Goal of Therapy:  appropriate Zosyn dose for renal function  Plan:   Continue Zosyn 3.375  gm IV q8hrs (each over 4 hours)  Will follow renal function, final culture data, progress.  Arty Baumgartner, [place] Pager: [phone] 02/06/2015,6:07 PM

## 2015-02-06 NOTE — Progress Notes (Addendum)
[facility] PROGRESS NOTE  [name] [id number] DOB: 03-15-1929 DOA: 02/03/2015 PCP: Irven Shelling, MD  Assessment/Plan: 1. Cholangitis -Clinically suspect passed stone however she also has a cystic lesion in the pancreatic head, which could be causing extrinsic compression, Ca 19-9 elevated -started on clears, GI and CCS following -continue  IV Zosyn, has gram negative bacteremia, - MRCP without obvious panc CA, ? Cholecystitis, GI recommends consideration of cholecystectomy and IOC no plan for ERCP at this time  2. 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 Acute on Chronic diastolic CHF -appears slightly overloaded at this time, Iv lasix x2 doses -CXR   4. Diabetes mellitus -Hold Amaryl, sliding scale insulin for now  5. History of CVA with hemiplegia/Aphasia -ASA on hold  6. CKD3 -stable  DVT proph: Hep SQ  Consultants: Gi CCS  HPI/Subjective: C/o some dyspnea  Objective: Filed Vitals:   02/06/15 0739 02/06/15 0824  BP:  139/60  Pulse:  90  Temp: 100.4 F (38 C)   Resp:  23    Intake/Output Summary (Last 24 hours) at 02/06/15 1242 Last data filed at 02/06/15 1120  Gross per 24 hour  Intake    780 ml  Output    250 ml  Net    530 ml   Filed Weights   02/03/15 2134 02/05/15 1247 02/05/15 2211  Weight: 77.3 kg (170 lb 6.7 oz) 79 kg (174 lb 2.6 oz) 80.3 kg (177 lb 0.5 oz)    Exam:   General:  AAOx3, dysarthric  Cardiovascular: S1S2/RRR  Respiratory: fine basilar crackles  Abdomen: soft, NT, BS present  Musculoskeletal: no edema   L hemiplegia   Data Reviewed: Basic Metabolic Panel:  Recent Labs Lab 02/03/15 1335 02/04/15 0428 02/05/15 0408 02/06/15 0614  NA 143 143 146* 143  K 3.6 3.1* 3.5 3.5  CL 105 106 112* 108  CO2 20* 26 25 25   GLUCOSE 261* 193* 112*  207*  BUN 11 12 10 9   CREATININE 1.19* 1.29* 1.02* 1.10*  CALCIUM 9.3 8.3* 8.4* 9.1   Liver Function Tests:  Recent Labs Lab 02/03/15 1335 02/04/15 0428 02/05/15 0408 02/06/15 0614  AST 441* 276* 90* 40  ALT 136* 189* 115* 88*  ALKPHOS 233* 171* 144* 152*  BILITOT 3.0* 3.2* 1.7* 1.3*  PROT 6.6 5.0* 5.6* 6.5  ALBUMIN 3.5 2.6* 2.5* 3.0*    Recent Labs Lab 02/03/15 1702  LIPASE 32   No results for input(s): AMMONIA in the last 168 hours. CBC:  Recent Labs Lab 02/03/15 1335 02/04/15 0428 02/05/15 0408 02/06/15 0920  WBC 8.3 13.8* 7.5 7.4  NEUTROABS 7.9*  --   --   --   HGB 13.3 10.7* 10.3* 12.0  HCT 42.2 34.5* 33.5* 38.3  MCV 90.8 90.1 91.3 89.3  PLT 199 160 126* 139*   Cardiac Enzymes:  Recent Labs Lab 02/03/15 1335  TROPONINI <0.03   BNP (last 3 results)  Recent Labs  07/18/14 1216 01/13/15 0926 01/14/15 1040  BNP 250.6* 173.8* 45.0    ProBNP (last 3 results) No results for input(s): PROBNP in the last 8760 hours.  CBG:  Recent Labs Lab 02/05/15 2024 02/06/15 0002 02/06/15 0356 02/06/15 0738 02/06/15 1150  GLUCAP 241* 220* 180* 188* 288*    Recent Results (from the past 240 hour(s))  Blood culture (routine x 2)     Status: None  Collection Time: 02/03/15  1:35 PM  Result Value Ref Range Status   Specimen Description BLOOD RIGHT ARM  Final   Special Requests BOTTLES DRAWN AEROBIC AND ANAEROBIC 5CC  Final   Culture  Setup Time   Final    GRAM NEGATIVE RODS IN BOTH AEROBIC AND ANAEROBIC BOTTLES CRITICAL RESULT CALLED TO, READ BACK BY AND VERIFIED WITH: B REAP @0202   02/04/15 MKELLY    Culture ENTEROBACTER CLOACAE  Final   Report Status 02/06/2015 FINAL  Final   Organism ID, Bacteria ENTEROBACTER CLOACAE  Final      Susceptibility   Enterobacter cloacae - MIC*    CEFAZOLIN <=4 RESISTANT Resistant     CEFEPIME <=1 SENSITIVE Sensitive     CEFTAZIDIME <=1 SENSITIVE Sensitive     CEFTRIAXONE <=1 SENSITIVE Sensitive     CIPROFLOXACIN  <=0.25 SENSITIVE Sensitive     GENTAMICIN <=1 SENSITIVE Sensitive     IMIPENEM <=0.25 SENSITIVE Sensitive     TRIMETH/SULFA <=20 SENSITIVE Sensitive     PIP/TAZO <=4 SENSITIVE Sensitive     * ENTEROBACTER CLOACAE  Blood culture (routine x 2)     Status: None (Preliminary result)   Collection Time: 02/03/15  2:10 PM  Result Value Ref Range Status   Specimen Description BLOOD LEFT HAND  Final   Special Requests BOTTLES DRAWN AEROBIC AND ANAEROBIC 5CC  Final   Culture  Setup Time   Final    GRAM NEGATIVE RODS AEROBIC BOTTLE ONLY CRITICAL RESULT CALLED TO, READ BACK BY AND VERIFIED WITH: A PETTIFORD @0552  02-10-2015 MKELLY    Culture GRAM NEGATIVE RODS  Final   Report Status PENDING  Incomplete  MRSA PCR Screening     Status: None   Collection Time: 02/03/15 10:13 PM  Result Value Ref Range Status   MRSA by PCR NEGATIVE NEGATIVE Final    Comment:        The GeneXpert MRSA Assay (FDA approved for NASAL specimens only), is one component of a comprehensive MRSA colonization surveillance program. It is not intended to diagnose MRSA infection nor to guide or monitor treatment for MRSA infections.      Studies: Mr 3d Recon At Scanner  2015-02-10  CLINICAL DATA:  Cholangitis with suspected past common duct stone. Cystic pancreatic head lesion. EXAM: MRI ABDOMEN WITHOUT CONTRAST  (INCLUDING MRCP) TECHNIQUE: Multiplanar multisequence MR imaging of the abdomen was performed. Heavily T2-weighted images of the biliary and pancreatic ducts were obtained, and three-dimensional MRCP images were rendered by post processing. COMPARISON:  CT 02/03/2015 and 03/23/2010. FINDINGS: Lower chest: Cardiomegaly and mild atelectasis at both lung bases, similar to recent CT. No significant pleural effusion. Hepatobiliary: The thin section MRCP images are motion degraded. No focal hepatic abnormalities are identified. As demonstrated on recent CT, there is intrahepatic and extrahepatic biliary dilatation. The  common bile duct measures up to 1.5 cm in diameter. No intraductal calculi are demonstrated. There is mild gallbladder wall thickening and pericholecystic fluid without discrete gallstones. Pancreas: The pancreatic duct is normal in caliber. There is a cystic lesion within the pancreatic head, measuring 2.8 x 2.4 cm. This contains dependent intrinsic T1 shortening, but otherwise appears simple. This has only minimally enlarged from 2012 and is likely a benign finding. There is an adjacent periampullary duodenal diverticulum. No surrounding inflammatory changes. Spleen: Normal in size without focal abnormality. Adrenals/Urinary Tract: Both adrenal glands appear normal. Both kidneys demonstrate mild cortical thinning and perinephric soft tissue stranding. There are small left renal cysts. No hydronephrosis. Stomach/Bowel:  No evidence of bowel wall thickening, distention or surrounding inflammatory change.Minimal ascites and mesenteric edema. Vascular/Lymphatic: There are no enlarged abdominal lymph nodes. Aortoiliac atherosclerosis better demonstrated on CT. Duplication of the IVC. Other: None. Musculoskeletal: Multilevel spondylosis and scoliosis noted. No acute osseous findings. IMPRESSION: 1. Persistent biliary dilatation of undetermined etiology. No evidence of choledocholithiasis. Of note, this biliary dilatation appears chronic and not grossly changed from 2012. 2. Gallbladder wall thickening and pericholecystic fluid may reflect cholecystitis in the appropriate clinical context. 3. Cystic lesion in the pancreatic head demonstrates no aggressive characteristics and is only mildly enlarged from 2012, likely a benign finding. Electronically Signed   By: Richardean Sale M.D.   On: 02/05/2015 17:32   Dg Chest Port 1 View  02/06/2015  CLINICAL DATA:  Dyspnea and mid sternal chest pain. EXAM: PORTABLE CHEST 1 VIEW COMPARISON:  02/03/2015 FINDINGS: Lung volumes are extremely low bilaterally. Despite low volumes, a  degree of interstitial edema is suspected. There may be small bilateral pleural effusions. The heart size is stable. IMPRESSION: Low lung volumes with suspicion of interstitial edema. There may be small bilateral pleural effusions. Electronically Signed   By: Aletta Edouard M.D.   On: 02/06/2015 09:00   Mr Lambert Mody Cm/mrcp  02/05/2015  CLINICAL DATA:  Cholangitis with suspected past common duct stone. Cystic pancreatic head lesion. EXAM: MRI ABDOMEN WITHOUT CONTRAST  (INCLUDING MRCP) TECHNIQUE: Multiplanar multisequence MR imaging of the abdomen was performed. Heavily T2-weighted images of the biliary and pancreatic ducts were obtained, and three-dimensional MRCP images were rendered by post processing. COMPARISON:  CT 02/03/2015 and 03/23/2010. FINDINGS: Lower chest: Cardiomegaly and mild atelectasis at both lung bases, similar to recent CT. No significant pleural effusion. Hepatobiliary: The thin section MRCP images are motion degraded. No focal hepatic abnormalities are identified. As demonstrated on recent CT, there is intrahepatic and extrahepatic biliary dilatation. The common bile duct measures up to 1.5 cm in diameter. No intraductal calculi are demonstrated. There is mild gallbladder wall thickening and pericholecystic fluid without discrete gallstones. Pancreas: The pancreatic duct is normal in caliber. There is a cystic lesion within the pancreatic head, measuring 2.8 x 2.4 cm. This contains dependent intrinsic T1 shortening, but otherwise appears simple. This has only minimally enlarged from 2012 and is likely a benign finding. There is an adjacent periampullary duodenal diverticulum. No surrounding inflammatory changes. Spleen: Normal in size without focal abnormality. Adrenals/Urinary Tract: Both adrenal glands appear normal. Both kidneys demonstrate mild cortical thinning and perinephric soft tissue stranding. There are small left renal cysts. No hydronephrosis. Stomach/Bowel: No evidence of bowel  wall thickening, distention or surrounding inflammatory change.Minimal ascites and mesenteric edema. Vascular/Lymphatic: There are no enlarged abdominal lymph nodes. Aortoiliac atherosclerosis better demonstrated on CT. Duplication of the IVC. Other: None. Musculoskeletal: Multilevel spondylosis and scoliosis noted. No acute osseous findings. IMPRESSION: 1. Persistent biliary dilatation of undetermined etiology. No evidence of choledocholithiasis. Of note, this biliary dilatation appears chronic and not grossly changed from 2012. 2. Gallbladder wall thickening and pericholecystic fluid may reflect cholecystitis in the appropriate clinical context. 3. Cystic lesion in the pancreatic head demonstrates no aggressive characteristics and is only mildly enlarged from 2012, likely a benign finding. Electronically Signed   By: Richardean Sale M.D.   On: 02/05/2015 17:32    Scheduled Meds: . amLODipine  5 mg Oral Daily  . antiseptic oral rinse  7 mL Mouth Rinse BID  . aspirin EC  325 mg Oral Daily  . atorvastatin  5 mg Oral  q1800  . furosemide  40 mg Intravenous Q6H  . heparin  5,000 Units Subcutaneous 3 times per day  . insulin aspart  0-9 Units Subcutaneous 6 times per day  . lithium citrate  78 mg Oral BID  . metoprolol  50 mg Oral BID  . mirtazapine  30 mg Oral QPM  . oxybutynin  5 mg Oral BID  . pantoprazole  80 mg Oral Daily  . piperacillin-tazobactam (ZOSYN)  IV  3.375 g Intravenous Q8H  . pregabalin  75 mg Oral BID  . Vilazodone HCl  20 mg Oral Daily  . zolpidem  2.5 mg Oral QHS   Continuous Infusions: . sodium chloride 10 mL/hr at 02/05/15 0854   Antibiotics Given (last 72 hours)    Date/Time Action Medication Dose Rate   02/03/15 2317 Given   piperacillin-tazobactam (ZOSYN) IVPB 3.375 g 3.375 g 100 mL/hr   02/04/15 1100 Given   piperacillin-tazobactam (ZOSYN) IVPB 3.375 g 3.375 g 12.5 mL/hr   02/04/15 1826 Given   piperacillin-tazobactam (ZOSYN) IVPB 3.375 g 3.375 g 12.5 mL/hr    02/05/15 0100 Given   piperacillin-tazobactam (ZOSYN) IVPB 3.375 g 3.375 g 12.5 mL/hr   02/05/15 0900 Given   piperacillin-tazobactam (ZOSYN) IVPB 3.375 g 3.375 g 12.5 mL/hr   02/05/15 1700 Given   piperacillin-tazobactam (ZOSYN) IVPB 3.375 g 3.375 g 12.5 mL/hr   02/06/15 0128 Given   piperacillin-tazobactam (ZOSYN) IVPB 3.375 g 3.375 g 12.5 mL/hr   02/06/15 0847 Given   piperacillin-tazobactam (ZOSYN) IVPB 3.375 g 3.375 g 12.5 mL/hr      Principal Problem:   Cholangitis Active Problems:   Type 2 diabetes mellitus with hyperglycemia (HCC)   Chronic diastolic heart failure ([place])   Essential hypertension, malignant   Sepsis ([place])    Time spent: 63min    Niels Cranshaw  [facility] Pager [phone]. If 7PM-7AM, please contact night-coverage at [email or web address], password [email or web address] 02/06/2015, 12:42 PM  LOS: 3 days

## 2015-02-07 LAB — CULTURE, BLOOD (ROUTINE X 2)

## 2015-02-07 LAB — COMPREHENSIVE METABOLIC PANEL
ALT: 52 U/L (ref 14–54)
AST: 18 U/L (ref 15–41)
Albumin: 2.6 g/dL — ABNORMAL LOW (ref 3.5–5.0)
Alkaline Phosphatase: 125 U/L (ref 38–126)
Anion gap: 7 (ref 5–15)
BUN: 5 mg/dL — AB (ref 6–20)
CHLORIDE: 104 mmol/L (ref 101–111)
CO2: 32 mmol/L (ref 22–32)
Calcium: 9 mg/dL (ref 8.9–10.3)
Creatinine, Ser: 1.11 mg/dL — ABNORMAL HIGH (ref 0.44–1.00)
GFR calc Af Amer: 52 mL/min — ABNORMAL LOW (ref >60)
GFR, EST NON AFRICAN AMERICAN: 45 mL/min — AB (ref >60)
Glucose, Bld: 162 mg/dL — ABNORMAL HIGH (ref 65–99)
POTASSIUM: 3.8 mmol/L (ref 3.5–5.1)
Sodium: 143 mmol/L (ref 135–145)
Total Bilirubin: 1 mg/dL (ref 0.3–1.2)
Total Protein: 5.8 g/dL — ABNORMAL LOW (ref 6.5–8.1)

## 2015-02-07 LAB — GLUCOSE, CAPILLARY
GLUCOSE-CAPILLARY: 173 mg/dL — AB (ref 65–99)
GLUCOSE-CAPILLARY: 187 mg/dL — AB (ref 65–99)
GLUCOSE-CAPILLARY: 334 mg/dL — AB (ref 65–99)
GLUCOSE-CAPILLARY: 211 mg/dL — AB (ref 65–99)
Glucose-Capillary: 160 mg/dL — ABNORMAL HIGH (ref 65–99)
Glucose-Capillary: 287 mg/dL — ABNORMAL HIGH (ref 65–99)

## 2015-02-07 LAB — CBC
HCT: 35.4 % — ABNORMAL LOW (ref 36.0–46.0)
Hemoglobin: 11.3 g/dL — ABNORMAL LOW (ref 12.0–15.0)
MCH: 28.5 pg (ref 26.0–34.0)
MCHC: 31.9 g/dL (ref 30.0–36.0)
MCV: 89.4 fL (ref 78.0–100.0)
PLATELETS: 135 10*3/uL — AB (ref 150–400)
RBC: 3.96 MIL/uL (ref 3.87–5.11)
RDW: 12.9 % (ref 11.5–15.5)
WBC: 5.1 10*3/uL (ref 4.0–10.5)

## 2015-02-07 MED ORDER — FUROSEMIDE 40 MG PO TABS
40.0000 mg | ORAL_TABLET | Freq: Every day | ORAL | Status: DC
  Administered 2015-02-07 – 2015-02-09 (×3): 40 mg via ORAL
  Filled 2015-02-07 (×3): qty 1

## 2015-02-07 MED ORDER — GLUCERNA SHAKE PO LIQD
237.0000 mL | Freq: Two times a day (BID) | ORAL | Status: DC
  Administered 2015-02-08 – 2015-02-09 (×2): 237 mL via ORAL

## 2015-02-07 NOTE — Progress Notes (Signed)
Patient ID: [name], female   DOB: [birth year], 81 y.o.   MRN: [id number]    Subjective: Pt quite sedated, barely arouse to answer any questions.  The only question she finally answers is that no she does not have any abdominal pain.  Objective: Vital signs in last 24 hours: Temp:  [98.6 F (37 C)-99 F (37.2 C)] 98.9 F (37.2 C) (12/05 0509) Pulse Rate:  [68-90] 68 (12/05 0509) Resp:  [19-23] 20 (12/05 0509) BP: (139-186)/(51-60) 151/51 mmHg (12/05 0509) SpO2:  [94 %-100 %] 98 % (12/05 0509) Weight:  [81.035 kg (178 lb 10.4 oz)] 81.035 kg (178 lb 10.4 oz) (12/04 2015) Last BM Date: 02/06/15  Intake/Output from previous day: 12/04 0701 - 12/05 0700 In: 1848 [P.O.:1500; I.V.:298; IV Piggyback:50] Out: 200 [Urine:200] Intake/Output this shift:    PE: Abd: soft, NT, Nd, +BS  Lab Results:   Recent Labs  02/06/15 0920 02/07/15 0526  WBC 7.4 5.1  HGB 12.0 11.3*  HCT 38.3 35.4*  PLT 139* 135*   BMET  Recent Labs  02/05/15 0408 02/06/15 0614  NA 146* 143  K 3.5 3.5  CL 112* 108  CO2 25 25  GLUCOSE 112* 207*  BUN 10 9  CREATININE 1.02* 1.10*  CALCIUM 8.4* 9.1   PT/INR No results for input(s): LABPROT, INR in the last 72 hours. CMP     Component Value Date/Time   NA 143 02/06/2015 0614   K 3.5 02/06/2015 0614   CL 108 02/06/2015 0614   CO2 25 02/06/2015 0614   GLUCOSE 207* 02/06/2015 0614   BUN 9 02/06/2015 0614   CREATININE 1.10* 02/06/2015 0614   CALCIUM 9.1 02/06/2015 0614   PROT 6.5 02/06/2015 0614   ALBUMIN 3.0* 02/06/2015 0614   AST 40 02/06/2015 0614   ALT 88* 02/06/2015 0614   ALKPHOS 152* 02/06/2015 0614   BILITOT 1.3* 02/06/2015 0614   GFRNONAA 45* 02/06/2015 0614   GFRAA 52* 02/06/2015 0614   Lipase     Component Value Date/Time   LIPASE 32 02/03/2015 1702       Studies/Results: Mr 3d Recon At Scanner  02/05/2015  CLINICAL DATA:  Cholangitis with suspected past common duct stone. Cystic pancreatic head lesion. EXAM: MRI ABDOMEN  WITHOUT CONTRAST  (INCLUDING MRCP) TECHNIQUE: Multiplanar multisequence MR imaging of the abdomen was performed. Heavily T2-weighted images of the biliary and pancreatic ducts were obtained, and three-dimensional MRCP images were rendered by post processing. COMPARISON:  CT 02/03/2015 and 03/23/2010. FINDINGS: Lower chest: Cardiomegaly and mild atelectasis at both lung bases, similar to recent CT. No significant pleural effusion. Hepatobiliary: The thin section MRCP images are motion degraded. No focal hepatic abnormalities are identified. As demonstrated on recent CT, there is intrahepatic and extrahepatic biliary dilatation. The common bile duct measures up to 1.5 cm in diameter. No intraductal calculi are demonstrated. There is mild gallbladder wall thickening and pericholecystic fluid without discrete gallstones. Pancreas: The pancreatic duct is normal in caliber. There is a cystic lesion within the pancreatic head, measuring 2.8 x 2.4 cm. This contains dependent intrinsic T1 shortening, but otherwise appears simple. This has only minimally enlarged from 2012 and is likely a benign finding. There is an adjacent periampullary duodenal diverticulum. No surrounding inflammatory changes. Spleen: Normal in size without focal abnormality. Adrenals/Urinary Tract: Both adrenal glands appear normal. Both kidneys demonstrate mild cortical thinning and perinephric soft tissue stranding. There are small left renal cysts. No hydronephrosis. Stomach/Bowel: No evidence of bowel wall thickening, distention or surrounding  inflammatory change.Minimal ascites and mesenteric edema. Vascular/Lymphatic: There are no enlarged abdominal lymph nodes. Aortoiliac atherosclerosis better demonstrated on CT. Duplication of the IVC. Other: None. Musculoskeletal: Multilevel spondylosis and scoliosis noted. No acute osseous findings. IMPRESSION: 1. Persistent biliary dilatation of undetermined etiology. No evidence of choledocholithiasis. Of  note, this biliary dilatation appears chronic and not grossly changed from 2012. 2. Gallbladder wall thickening and pericholecystic fluid may reflect cholecystitis in the appropriate clinical context. 3. Cystic lesion in the pancreatic head demonstrates no aggressive characteristics and is only mildly enlarged from 2012, likely a benign finding. Electronically Signed   By: Richardean Sale M.D.   On: 02/05/2015 17:32   Dg Chest Port 1 View  02/06/2015  CLINICAL DATA:  Dyspnea and mid sternal chest pain. EXAM: PORTABLE CHEST 1 VIEW COMPARISON:  02/03/2015 FINDINGS: Lung volumes are extremely low bilaterally. Despite low volumes, a degree of interstitial edema is suspected. There may be small bilateral pleural effusions. The heart size is stable. IMPRESSION: Low lung volumes with suspicion of interstitial edema. There may be small bilateral pleural effusions. Electronically Signed   By: Aletta Edouard M.D.   On: 02/06/2015 09:00   Mr Lambert Mody Cm/mrcp  02/05/2015  CLINICAL DATA:  Cholangitis with suspected past common duct stone. Cystic pancreatic head lesion. EXAM: MRI ABDOMEN WITHOUT CONTRAST  (INCLUDING MRCP) TECHNIQUE: Multiplanar multisequence MR imaging of the abdomen was performed. Heavily T2-weighted images of the biliary and pancreatic ducts were obtained, and three-dimensional MRCP images were rendered by post processing. COMPARISON:  CT 02/03/2015 and 03/23/2010. FINDINGS: Lower chest: Cardiomegaly and mild atelectasis at both lung bases, similar to recent CT. No significant pleural effusion. Hepatobiliary: The thin section MRCP images are motion degraded. No focal hepatic abnormalities are identified. As demonstrated on recent CT, there is intrahepatic and extrahepatic biliary dilatation. The common bile duct measures up to 1.5 cm in diameter. No intraductal calculi are demonstrated. There is mild gallbladder wall thickening and pericholecystic fluid without discrete gallstones. Pancreas: The pancreatic  duct is normal in caliber. There is a cystic lesion within the pancreatic head, measuring 2.8 x 2.4 cm. This contains dependent intrinsic T1 shortening, but otherwise appears simple. This has only minimally enlarged from 2012 and is likely a benign finding. There is an adjacent periampullary duodenal diverticulum. No surrounding inflammatory changes. Spleen: Normal in size without focal abnormality. Adrenals/Urinary Tract: Both adrenal glands appear normal. Both kidneys demonstrate mild cortical thinning and perinephric soft tissue stranding. There are small left renal cysts. No hydronephrosis. Stomach/Bowel: No evidence of bowel wall thickening, distention or surrounding inflammatory change.Minimal ascites and mesenteric edema. Vascular/Lymphatic: There are no enlarged abdominal lymph nodes. Aortoiliac atherosclerosis better demonstrated on CT. Duplication of the IVC. Other: None. Musculoskeletal: Multilevel spondylosis and scoliosis noted. No acute osseous findings. IMPRESSION: 1. Persistent biliary dilatation of undetermined etiology. No evidence of choledocholithiasis. Of note, this biliary dilatation appears chronic and not grossly changed from 2012. 2. Gallbladder wall thickening and pericholecystic fluid may reflect cholecystitis in the appropriate clinical context. 3. Cystic lesion in the pancreatic head demonstrates no aggressive characteristics and is only mildly enlarged from 2012, likely a benign finding. Electronically Signed   By: Richardean Sale M.D.   On: 02/05/2015 17:32    Anti-infectives: Anti-infectives    Start     Dose/Rate Route Frequency Ordered Stop   02/04/15 1430  vancomycin (VANCOCIN) IVPB 1000 mg/200 mL premix  Status:  Discontinued     1,000 mg 200 mL/hr over 60 Minutes Intravenous Every 24  hours 02/03/15 1444 02/03/15 2015   02/04/15 0900  piperacillin-tazobactam (ZOSYN) IVPB 3.375 g     3.375 g 12.5 mL/hr over 240 Minutes Intravenous Every 8 hours 02/04/15 0841      02/03/15 2030  piperacillin-tazobactam (ZOSYN) IVPB 3.375 g     3.375 g 100 mL/hr over 30 Minutes Intravenous  Once 02/03/15 2019 02/03/15 2347   02/03/15 1400  vancomycin (VANCOCIN) 1,500 mg in sodium chloride 0.9 % 500 mL IVPB     1,500 mg 250 mL/hr over 120 Minutes Intravenous STAT 02/03/15 1349 02/03/15 2007   02/03/15 1345  piperacillin-tazobactam (ZOSYN) IVPB 3.375 g     3.375 g 100 mL/hr over 30 Minutes Intravenous  Once 02/03/15 1334 02/03/15 1515       Assessment/Plan  1. Pancreatic head cyst with biliary dilatation/elevated LFTs -LFTs are improving. MRCP does not reveal choledocholithiasis, but does suggest maybe some slight wall thickening and pericholecystic fluid, cholecystitis could not be ruled out.  GI does not feel as though the patient needs an ERCP. -will d/w Dr. Hulen Skains whether he feels the patient requires a HIDA scan.  She is currently nontender with a normal WBC. -will follow along.   LOS: 4 days    Kimmy Parish E 02/07/2015, 8:14 AM Pager: [phone]

## 2015-02-07 NOTE — Progress Notes (Signed)
[facility] PROGRESS NOTE  [name] [id number] DOB: [birth year] DOA: 02/03/2015 PCP: Irven Shelling, MD  Assessment/Plan: 1. Cholangitis -Clinically suspect passed stone however she also has a cystic lesion in the pancreatic head, which could be causing extrinsic compression, Ca 19-9 mildly elevated - improving on IV Zosyn, has Enterobacter bacteremia - MRCP without obvious panc CA, ? Cholecystitis,  -per Surgery she doesn't have active cholecystitis and primary problem is CBD partial obstruction/dilation hence HIDA/Cholecystectomy not indicated at this time -GI may consider ERCP if she has a recurrent episode of cholangitis  2. 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 Acute on Chronic diastolic CHF -improved with Iv lasix x2 doses -PO lasix today  4. Diabetes mellitus -Hold Amaryl, sliding scale insulin   5. History of CVA with hemiplegia/Aphasia -ASA on hold  6. CKD3 -stable  7. Bipolar d/o -on lithium  DVT proph: Hep SQ  Consultants: Gi CCS  HPI/Subjective: Feels ok, wants to go home, breathing better  Objective: Filed Vitals:   02/06/15 2015 02/07/15 0509  BP: 186/58 151/51  Pulse: 79 68  Temp: 99 F (37.2 C) 98.9 F (37.2 C)  Resp: 20 20    Intake/Output Summary (Last 24 hours) at 02/07/15 1152 Last data filed at 02/07/15 0900  Gross per 24 hour  Intake    877 ml  Output    200 ml  Net    677 ml   Filed Weights   02/05/15 1247 02/05/15 2211 02/06/15 2015  Weight: 79 kg (174 lb 2.6 oz) 80.3 kg (177 lb 0.5 oz) 81.035 kg (178 lb 10.4 oz)    Exam:   General:  AAOx3, dysarthric  Cardiovascular: S1S2/RRR  Respiratory: clear today  Abdomen: soft, NT, BS present  Musculoskeletal: no edema   L hemiplegia   Data Reviewed: Basic Metabolic  Panel:  Recent Labs Lab 02/03/15 1335 02/04/15 0428 02/05/15 0408 02/06/15 0614 02/07/15 0526  NA 143 143 146* 143 143  K 3.6 3.1* 3.5 3.5 3.8  CL 105 106 112* 108 104  CO2 20* 26 25 25  32  GLUCOSE 261* 193* 112* 207* 162*  BUN 11 12 10 9  5*  CREATININE 1.19* 1.29* 1.02* 1.10* 1.11*  CALCIUM 9.3 8.3* 8.4* 9.1 9.0   Liver Function Tests:  Recent Labs Lab 02/03/15 1335 02/04/15 0428 02/05/15 0408 02/06/15 0614 02/07/15 0526  AST 441* 276* 90* 40 18  ALT 136* 189* 115* 88* 52  ALKPHOS 233* 171* 144* 152* 125  BILITOT 3.0* 3.2* 1.7* 1.3* 1.0  PROT 6.6 5.0* 5.6* 6.5 5.8*  ALBUMIN 3.5 2.6* 2.5* 3.0* 2.6*    Recent Labs Lab 02/03/15 1702  LIPASE 32   No results for input(s): AMMONIA in the last 168 hours. CBC:  Recent Labs Lab 02/03/15 1335 02/04/15 0428 02/05/15 0408 02/06/15 0920 02/07/15 0526  WBC 8.3 13.8* 7.5 7.4 5.1  NEUTROABS 7.9*  --   --   --   --   HGB 13.3 10.7* 10.3* 12.0 11.3*  HCT 42.2 34.5* 33.5* 38.3 35.4*  MCV 90.8 90.1 91.3 89.3 89.4  PLT 199 160 126* 139* 135*   Cardiac Enzymes:  Recent Labs Lab 02/03/15 1335  TROPONINI <0.03   BNP (last 3 results)  Recent Labs  07/18/14 1216 01/13/15 0926 01/14/15 1040  BNP 250.6* 173.8* 45.0    ProBNP (last 3 results) No results  for input(s): PROBNP in the last 8760 hours.  CBG:  Recent Labs Lab 02/06/15 1654 02/06/15 2013 02/07/15 0102 02/07/15 0418 02/07/15 0817  GLUCAP 247* 187* 173* 187* 160*    Recent Results (from the past 240 hour(s))  Blood culture (routine x 2)     Status: None   Collection Time: 02/03/15  1:35 PM  Result Value Ref Range Status   Specimen Description BLOOD RIGHT ARM  Final   Special Requests BOTTLES DRAWN AEROBIC AND ANAEROBIC 5CC  Final   Culture  Setup Time   Final    GRAM NEGATIVE RODS IN BOTH AEROBIC AND ANAEROBIC BOTTLES CRITICAL RESULT CALLED TO, READ BACK BY AND VERIFIED WITH: B REAP @0202   02/04/15 MKELLY    Culture ENTEROBACTER CLOACAE   Final   Report Status 02/06/2015 FINAL  Final   Organism ID, Bacteria ENTEROBACTER CLOACAE  Final      Susceptibility   Enterobacter cloacae - MIC*    CEFAZOLIN <=4 RESISTANT Resistant     CEFEPIME <=1 SENSITIVE Sensitive     CEFTAZIDIME <=1 SENSITIVE Sensitive     CEFTRIAXONE <=1 SENSITIVE Sensitive     CIPROFLOXACIN <=0.25 SENSITIVE Sensitive     GENTAMICIN <=1 SENSITIVE Sensitive     IMIPENEM <=0.25 SENSITIVE Sensitive     TRIMETH/SULFA <=20 SENSITIVE Sensitive     PIP/TAZO <=4 SENSITIVE Sensitive     * ENTEROBACTER CLOACAE  Blood culture (routine x 2)     Status: None   Collection Time: 02/03/15  2:10 PM  Result Value Ref Range Status   Specimen Description BLOOD LEFT HAND  Final   Special Requests BOTTLES DRAWN AEROBIC AND ANAEROBIC 5CC  Final   Culture  Setup Time   Final    GRAM NEGATIVE RODS AEROBIC BOTTLE ONLY CRITICAL RESULT CALLED TO, READ BACK BY AND VERIFIED WITH: A PETTIFORD @0552  02-15-15 MKELLY    Culture   Final    ENTEROBACTER CLOACAE SUSCEPTIBILITIES PERFORMED ON PREVIOUS CULTURE WITHIN THE LAST 5 DAYS.    Report Status 02/07/2015 FINAL  Final  MRSA PCR Screening     Status: None   Collection Time: 02/03/15 10:13 PM  Result Value Ref Range Status   MRSA by PCR NEGATIVE NEGATIVE Final    Comment:        The GeneXpert MRSA Assay (FDA approved for NASAL specimens only), is one component of a comprehensive MRSA colonization surveillance program. It is not intended to diagnose MRSA infection nor to guide or monitor treatment for MRSA infections.      Studies: Mr 3d Recon At Scanner  2015/02/15  CLINICAL DATA:  Cholangitis with suspected past common duct stone. Cystic pancreatic head lesion. EXAM: MRI ABDOMEN WITHOUT CONTRAST  (INCLUDING MRCP) TECHNIQUE: Multiplanar multisequence MR imaging of the abdomen was performed. Heavily T2-weighted images of the biliary and pancreatic ducts were obtained, and three-dimensional MRCP images were rendered by post  processing. COMPARISON:  CT 02/03/2015 and 03/23/2010. FINDINGS: Lower chest: Cardiomegaly and mild atelectasis at both lung bases, similar to recent CT. No significant pleural effusion. Hepatobiliary: The thin section MRCP images are motion degraded. No focal hepatic abnormalities are identified. As demonstrated on recent CT, there is intrahepatic and extrahepatic biliary dilatation. The common bile duct measures up to 1.5 cm in diameter. No intraductal calculi are demonstrated. There is mild gallbladder wall thickening and pericholecystic fluid without discrete gallstones. Pancreas: The pancreatic duct is normal in caliber. There is a cystic lesion within the pancreatic head, measuring 2.8 x 2.4 cm.  This contains dependent intrinsic T1 shortening, but otherwise appears simple. This has only minimally enlarged from 2012 and is likely a benign finding. There is an adjacent periampullary duodenal diverticulum. No surrounding inflammatory changes. Spleen: Normal in size without focal abnormality. Adrenals/Urinary Tract: Both adrenal glands appear normal. Both kidneys demonstrate mild cortical thinning and perinephric soft tissue stranding. There are small left renal cysts. No hydronephrosis. Stomach/Bowel: No evidence of bowel wall thickening, distention or surrounding inflammatory change.Minimal ascites and mesenteric edema. Vascular/Lymphatic: There are no enlarged abdominal lymph nodes. Aortoiliac atherosclerosis better demonstrated on CT. Duplication of the IVC. Other: None. Musculoskeletal: Multilevel spondylosis and scoliosis noted. No acute osseous findings. IMPRESSION: 1. Persistent biliary dilatation of undetermined etiology. No evidence of choledocholithiasis. Of note, this biliary dilatation appears chronic and not grossly changed from 2012. 2. Gallbladder wall thickening and pericholecystic fluid may reflect cholecystitis in the appropriate clinical context. 3. Cystic lesion in the pancreatic head  demonstrates no aggressive characteristics and is only mildly enlarged from 2012, likely a benign finding. Electronically Signed   By: Richardean Sale M.D.   On: 02/05/2015 17:32   Dg Chest Port 1 View  02/06/2015  CLINICAL DATA:  Dyspnea and mid sternal chest pain. EXAM: PORTABLE CHEST 1 VIEW COMPARISON:  02/03/2015 FINDINGS: Lung volumes are extremely low bilaterally. Despite low volumes, a degree of interstitial edema is suspected. There may be small bilateral pleural effusions. The heart size is stable. IMPRESSION: Low lung volumes with suspicion of interstitial edema. There may be small bilateral pleural effusions. Electronically Signed   By: Aletta Edouard M.D.   On: 02/06/2015 09:00   Mr Lambert Mody Cm/mrcp  02/05/2015  CLINICAL DATA:  Cholangitis with suspected past common duct stone. Cystic pancreatic head lesion. EXAM: MRI ABDOMEN WITHOUT CONTRAST  (INCLUDING MRCP) TECHNIQUE: Multiplanar multisequence MR imaging of the abdomen was performed. Heavily T2-weighted images of the biliary and pancreatic ducts were obtained, and three-dimensional MRCP images were rendered by post processing. COMPARISON:  CT 02/03/2015 and 03/23/2010. FINDINGS: Lower chest: Cardiomegaly and mild atelectasis at both lung bases, similar to recent CT. No significant pleural effusion. Hepatobiliary: The thin section MRCP images are motion degraded. No focal hepatic abnormalities are identified. As demonstrated on recent CT, there is intrahepatic and extrahepatic biliary dilatation. The common bile duct measures up to 1.5 cm in diameter. No intraductal calculi are demonstrated. There is mild gallbladder wall thickening and pericholecystic fluid without discrete gallstones. Pancreas: The pancreatic duct is normal in caliber. There is a cystic lesion within the pancreatic head, measuring 2.8 x 2.4 cm. This contains dependent intrinsic T1 shortening, but otherwise appears simple. This has only minimally enlarged from 2012 and is likely a  benign finding. There is an adjacent periampullary duodenal diverticulum. No surrounding inflammatory changes. Spleen: Normal in size without focal abnormality. Adrenals/Urinary Tract: Both adrenal glands appear normal. Both kidneys demonstrate mild cortical thinning and perinephric soft tissue stranding. There are small left renal cysts. No hydronephrosis. Stomach/Bowel: No evidence of bowel wall thickening, distention or surrounding inflammatory change.Minimal ascites and mesenteric edema. Vascular/Lymphatic: There are no enlarged abdominal lymph nodes. Aortoiliac atherosclerosis better demonstrated on CT. Duplication of the IVC. Other: None. Musculoskeletal: Multilevel spondylosis and scoliosis noted. No acute osseous findings. IMPRESSION: 1. Persistent biliary dilatation of undetermined etiology. No evidence of choledocholithiasis. Of note, this biliary dilatation appears chronic and not grossly changed from 2012. 2. Gallbladder wall thickening and pericholecystic fluid may reflect cholecystitis in the appropriate clinical context. 3. Cystic lesion in the pancreatic head  demonstrates no aggressive characteristics and is only mildly enlarged from 2012, likely a benign finding. Electronically Signed   By: Richardean Sale M.D.   On: 02/05/2015 17:32    Scheduled Meds: . amLODipine  5 mg Oral Daily  . antiseptic oral rinse  7 mL Mouth Rinse BID  . aspirin EC  325 mg Oral Daily  . atorvastatin  5 mg Oral q1800  . heparin  5,000 Units Subcutaneous 3 times per day  . insulin aspart  0-9 Units Subcutaneous 6 times per day  . lithium citrate  78 mg Oral BID  . metoprolol  50 mg Oral BID  . mirtazapine  30 mg Oral QPM  . oxybutynin  5 mg Oral BID  . pantoprazole  80 mg Oral Daily  . piperacillin-tazobactam (ZOSYN)  IV  3.375 g Intravenous Q8H  . pregabalin  75 mg Oral BID  . Vilazodone HCl  20 mg Oral Daily  . zolpidem  2.5 mg Oral QHS   Continuous Infusions: . sodium chloride 10 mL/hr at 02/05/15  0854   Antibiotics Given (last 72 hours)    Date/Time Action Medication Dose Rate   02/04/15 1826 Given   piperacillin-tazobactam (ZOSYN) IVPB 3.375 g 3.375 g 12.5 mL/hr   02/05/15 0100 Given   piperacillin-tazobactam (ZOSYN) IVPB 3.375 g 3.375 g 12.5 mL/hr   02/05/15 0900 Given   piperacillin-tazobactam (ZOSYN) IVPB 3.375 g 3.375 g 12.5 mL/hr   02/05/15 1700 Given   piperacillin-tazobactam (ZOSYN) IVPB 3.375 g 3.375 g 12.5 mL/hr   02/06/15 0128 Given   piperacillin-tazobactam (ZOSYN) IVPB 3.375 g 3.375 g 12.5 mL/hr   02/06/15 0847 Given   piperacillin-tazobactam (ZOSYN) IVPB 3.375 g 3.375 g 12.5 mL/hr   02/06/15 1808 Given   piperacillin-tazobactam (ZOSYN) IVPB 3.375 g 3.375 g 12.5 mL/hr   02/07/15 0100 Given   piperacillin-tazobactam (ZOSYN) IVPB 3.375 g 3.375 g 12.5 mL/hr   02/07/15 0836 Given   piperacillin-tazobactam (ZOSYN) IVPB 3.375 g 3.375 g 12.5 mL/hr      Principal Problem:   Cholangitis Active Problems:   Type 2 diabetes mellitus with hyperglycemia (HCC)   Chronic diastolic heart failure ([place])   Essential hypertension, malignant   Sepsis ([place])    Time spent: 75min    Deaundre Allston  [facility] Pager [phone]. If 7PM-7AM, please contact night-coverage at [email or web address], password [email or web address] 02/07/2015, 11:52 AM  LOS: 4 days

## 2015-02-07 NOTE — Care Management Important Message (Signed)
Important Message  Patient Details  Name: [name] MRN: [id number] Date of Birth: 1929/03/11   Medicare Important Message Given:  Yes    Christpher Stogsdill P Ahnesti Townsend 02/07/2015, 12:02 PM

## 2015-02-07 NOTE — Progress Notes (Signed)
Initial Nutrition Assessment  DOCUMENTATION CODES:   Obesity unspecified  INTERVENTION:  Provide Glucerna Shake po BID, each supplement provides 220 kcal and 10 grams of protein.  Encourage adequate PO intake.   NUTRITION DIAGNOSIS:   Increased nutrient needs related to wound healing as evidenced by estimated needs.  GOAL:   Patient will meet greater than or equal to 90% of their needs  MONITOR:   PO intake, Supplement acceptance, Weight trends, Labs, I & O's, Skin  REASON FOR ASSESSMENT:   Low Braden    ASSESSMENT:   81 y.o. female with a Past Medical History of diabetes, HTN, CAD stage III, PVCs, HLD, GERD, chronic back pain, anemia, who presents to the ED with c/o abdominal pain, emesis. Symptoms onset just PTA to the ED  Pt reports her appetite is just "so so" currently. Meal completion this AM was 100%. Pt reports PTA eating well with consumption of at least 2 meals daily. Weight has been stable. Noted, pt with a diabetic heel ulcer. Pt is agreeable to nutritional supplements to aid in wound healing. RD to order. Pt was encouraged to eat her food at meals.   Pt with no observed significant fat or muscle mass loss.   Labs and medications reviewed.   Diet Order:  DIET SOFT Room service appropriate?: Yes; Fluid consistency:: Thin  Skin:  Wound (see comment) (Diabetic ulcer R heel)  Last BM:  12/4  Height:   Ht Readings from Last 1 Encounters:  02/03/15 5\' 4"  (1.626 m)    Weight:   Wt Readings from Last 1 Encounters:  02/06/15 178 lb 10.4 oz (81.035 kg)    Ideal Body Weight:  54.5 kg  BMI:  Body mass index is 30.65 kg/(m^2).  Estimated Nutritional Needs:   Kcal:  [id number]  Protein:  85-100 grams  Fluid:  1.7 - 1.9 L/day  EDUCATION NEEDS:   No education needs identified at this time  Corrin Parker, MS, RD, LDN Pager # [phone] After hours/ weekend pager # [phone]

## 2015-02-07 NOTE — Progress Notes (Addendum)
[name] 9:57 AM  Subjective: Patient doing better than yesterday without any GI complaints and her case was discussed with the hospital team  Objective: Vital signs stable afebrile no acute distress abdomen is soft nontender liver tests back to normal white count normal  Assessment: Resolving cholangitis  Plan: Happy to proceed with ERCP if symptoms recur await surgical opinion regarding gallbladder and continue antibiotics per primary team and either myself or Dr. Paulita Fujita happy see back in the office in follow-up particularly if needed or recurrent GI symptoms and please call me 5 to be of any further assistance with this hospital stay   [facility]  Pager [phone] After 5PM or if no answer call [phone]

## 2015-02-08 LAB — GLUCOSE, CAPILLARY
GLUCOSE-CAPILLARY: 153 mg/dL — AB (ref 65–99)
GLUCOSE-CAPILLARY: 189 mg/dL — AB (ref 65–99)
GLUCOSE-CAPILLARY: 232 mg/dL — AB (ref 65–99)
Glucose-Capillary: 187 mg/dL — ABNORMAL HIGH (ref 65–99)
Glucose-Capillary: 168 mg/dL — ABNORMAL HIGH (ref 65–99)
Glucose-Capillary: 238 mg/dL — ABNORMAL HIGH (ref 65–99)

## 2015-02-08 MED ORDER — LEVOFLOXACIN 750 MG PO TABS: 750.0000 mg | ORAL_TABLET | ORAL | Status: DC

## 2015-02-08 MED ORDER — LEVOFLOXACIN 750 MG PO TABS
750.0000 mg | ORAL_TABLET | ORAL | Status: DC
  Administered 2015-02-08: 750 mg via ORAL
  Filled 2015-02-08 (×2): qty 1

## 2015-02-08 MED ORDER — FUROSEMIDE 20 MG PO TABS: 40.0000 mg | ORAL_TABLET | Freq: Every day | ORAL | Status: AC

## 2015-02-08 NOTE — Evaluation (Signed)
Occupational Therapy Evaluation Patient Details Name: SHARONNA KARRER MRN: [id number] DOB: [birth year] Today's Date: 02/08/2015    History of Present Illness ANGENETTE SCHRAND is a 81 y.o. female who presents to the ED with c/o abdominal pain, emesis. Pt with Cholangitis. PMH: CHF, DM, CVA, bipolar d/o.    Clinical Impression   Patient presenting with deconditioning and decreased overall independence secondary to above. Patient's level of assistance ranged from mod I to mod assist PTA. Patient currently functioning at an overall min to mod assist level. Patient will benefit from acute OT to increase overall independence in the areas of ADLs, functional mobility, and overall safety in order to safely discharge home with husband and PCA (that of which assisted her PTA).     Follow Up Recommendations  No OT follow up;Supervision/Assistance - 24 hour    Equipment Recommendations  None recommended by OT    Recommendations for Other Services  None at this time   Precautions / Restrictions Precautions Precautions: Fall Restrictions Weight Bearing Restrictions: No    Mobility Bed Mobility Overal bed mobility: Needs Assistance Bed Mobility: Supine to Sit     Supine to sit: Min guard     General bed mobility comments: slight use of bedrails   Transfers Overall transfer level: Needs assistance Equipment used: Rolling walker (2 wheeled) Transfers: Sit to/from Stand Sit to Stand: Min guard General transfer comment: Cues for hand placement, technique with rw, and overall safety     Balance Overall balance assessment: Needs assistance Sitting-balance support: No upper extremity supported;Feet supported Sitting balance-Leahy Scale: Good     Standing balance support: Bilateral upper extremity supported;During functional activity Standing balance-Leahy Scale: Fair    ADL Overall ADL's : Needs assistance/impaired Eating/Feeding: Set up;Sitting   Grooming: Set up;Sitting   Upper Body  Bathing: Set up;Sitting   Lower Body Bathing: Moderate assistance;Sit to/from stand   Upper Body Dressing : Set up;Sitting   Lower Body Dressing: Maximal assistance   Toilet Transfer: Min guard;RW;BSC;Ambulation   Toileting- Clothing Manipulation and Hygiene: Moderate assistance Toileting - Clothing Manipulation Details (indicate cue type and reason): can do front peri needs help with back peri     Functional mobility during ADLs: Min guard;Minimal assistance;Cueing for safety;Rolling walker General ADL Comments: Pt close to baseline, somewhat deconditioned and will benefit from acute OT prior to d/c back home    Vision Vision Assessment?: No apparent visual deficits          Pertinent Vitals/Pain Pain Assessment: No/denies pain     Hand Dominance Right   Extremity/Trunk Assessment Upper Extremity Assessment Upper Extremity Assessment: Generalized weakness   Lower Extremity Assessment Lower Extremity Assessment: Generalized weakness   Cervical / Trunk Assessment Cervical / Trunk Assessment: Kyphotic   Communication Communication Communication: Expressive difficulties (h/o CVA)   Cognition Arousal/Alertness: Awake/alert Behavior During Therapy: WFL for tasks assessed/performed Overall Cognitive Status: Within Functional Limits for tasks assessed              Home Living Family/patient expects to be discharged to:: Private residence Living Arrangements: Spouse/significant other Available Help at Discharge: Available 24 hours/day;Family (PCA comes 2X per week for 2 hours to assist with showers) Type of Home: House Home Access: Ramped entrance     Home Layout: Two level;Able to live on main level with bedroom/bathroom   Alternate Level Stairs-Rails: Right Bathroom Shower/Tub: Walk-in shower;Door   Bathroom Toilet: Handicapped height     Home Equipment: Environmental consultant - 2 wheels;Shower seat;Bedside commode;Wheelchair - [employer] -  4 wheels;Hand held shower  head   Prior Functioning/Environment Level of Independence: Needs assistance  Gait / Transfers Assistance Needed: mod I to min guard with RW--pt reports someone is always with her when she is up on her feet ADL's / Homemaking Assistance Needed: Husband helps with LBD, pt can do UBD post set up    OT Diagnosis: Generalized weakness   OT Problem List: Decreased strength;Decreased activity tolerance;Impaired balance (sitting and/or standing);Decreased safety awareness;Decreased knowledge of use of DME or AE   OT Treatment/Interventions: Self-care/ADL training;Energy conservation;Therapeutic exercise;DME and/or AE instruction;Therapeutic activities;Patient/family education;Balance training    OT Goals(Current goals can be found in the care plan section) Acute Rehab OT Goals Patient Stated Goal: wants to go home OT Goal Formulation: With patient Time For Goal Achievement: 02/22/15 Potential to Achieve Goals: Good ADL Goals Pt Will Perform Grooming: standing;with modified independence Pt Will Transfer to Toilet: with supervision;bedside commode;ambulating Pt Will Perform Tub/Shower Transfer: with supervision;shower seat;ambulating;Shower transfer;rolling walker Pt/caregiver will Perform Home Exercise Program: Increased strength;Both right and left upper extremity;With minimal assist;With written HEP provided;With theraband Additional ADL Goal #1: Pt will be mod I with short distance functional mobility using RW   OT Frequency: Min 2X/week   Barriers to D/C: None known at this time   End of Session Equipment Utilized During Treatment: Gait belt;Rolling walker Nurse Communication: Other (comment) (prior to session discussed 12/1 orders for "bed rest". RN reports pt is OFF bed rest orders and can get up. ) Pt also reports getting up, out of bed yesterday.   Activity Tolerance: Patient tolerated treatment well Patient left: in chair;with call bell/phone within reach   Time: 0922-0942 OT Time  Calculation (min): 20 min Charges:  OT General Charges $OT Visit: 1 Procedure OT Evaluation $Initial OT Evaluation Tier I: 1 Procedure   Frazier Balfour , MS, OTR/L, CLT Pager: [phone]  02/08/2015, 9:55 AM

## 2015-02-08 NOTE — Progress Notes (Addendum)
ANTIBIOTIC NOTE - FOLLOW UP  Pharmacy Consult for Levaquin Indication: bacteremia  Patient Measurements: Height: 5\' 4"  (162.6 cm) Weight: 168 lb 6.9 oz (76.4 kg) IBW/kg (Calculated) : 54.7  Vital Signs: Temp: 98.5 F (36.9 C) (12/06 0432) Temp Source: Oral (12/06 0432) BP: 148/50 mmHg (12/06 0432) Pulse Rate: 59 (12/06 0432)   Intake/Output from previous day: 12/05 0701 - 12/06 0700 In: 630 [P.O.:240; I.V.:240; IV Piggyback:150] Out: 900 [Urine:900]:   Labs:  Recent Labs  02/06/15 0614 02/06/15 0920 02/07/15 0526  WBC  --  7.4 5.1  HGB  --  12.0 11.3*  PLT  --  139* 135*  CREATININE 1.10*  --  1.11*    Microbiology: Anti-infective's Vanc 12/1 x 1 dose  Zosyn 12/1>> 12/6  Levaquin: 12/6 >> 12/10  Cx Data: 12/1 BCx - E.aerogenes 3/4 - Resistant to Ancef; S to all other agents  Assessment: 85yoF admitted with cholangitis with associated E.aerogenes bacteremia. At present no plans for sx. Clinically responded to Zosyn but switching to Levaquin PO for completion of therapy and anticipation of discharge.  Goal of Therapy:  Treatment of infection  Plan:  1. Start Levaquin 750 mg Q48H PO for 4 more days per primary team 2. Continue to follow with you daily; further notes prn  Vincenza Hews, PharmD, BCPS 02/08/2015, 10:04 AM Pager: [phone]

## 2015-02-08 NOTE — Discharge Summary (Signed)
Physician Discharge Summary  [name] [id number] DOB: May 12, 1929 DOA: 02/03/2015  PCP: Irven Shelling, MD  Admit date: 02/03/2015 Discharge date: 02/08/2015  Time spent: 45 minutes  Recommendations for Outpatient Follow-up:  1. Dr.Magod or Outlaw with [facility] in 2 weeks, consideration for ERCP/EUS 2. PCP in 1 week  Discharge Diagnoses:    Cholangitis   Enterobacter bacteremia   H/o CVA   L hemiplegia   Type 2 diabetes mellitus with hyperglycemia (HCC)   Acute on Chronic diastolic heart failure (HCC)   Essential hypertension, malignant   Sepsis ([place])   CKD 3   Bipolar disorder  Discharge Condition: stable  Diet recommendation:diabetic, heart healthy  Filed Weights   02/05/15 2211 02/06/15 2015 02/07/15 1954  Weight: 80.3 kg (177 lb 0.5 oz) 81.035 kg (178 lb 10.4 oz) 76.4 kg (168 lb 6.9 oz)    History of present illness:  Chief Complaint: Emesis, abd pain HPI: [name] is a 81 y.o. female who presents to the ED with c/o abdominal pain, emesis. Symptoms onset just PTA to the ED today. Was feeling fine earlier in the day. Nothing makes symptoms better or worse. Symptom improved after treatment in ED. Initially patient was clearly septic with Tm 104.4 on arrival to ED that has now come down to 101.5. Lactate was initially over 6, but with aggressive hydration this has improved   Hospital Course:  1. Cholangitis -Clinically suspect passed stone however she also has a cystic lesion in the pancreatic head, which could be causing extrinsic compression, Ca 19-9 mildly elevated, but clinically not impressive per Gi Dr.Magod - treated with IV Zosyn, Blood cultures grew Enterobacter bacteremia - MRCP without obvious panc CA, ? Cholecystitis,  -per Surgery she doesn't have active cholecystitis and primary problem is CBD partial obstruction/dilation hence HIDA/Cholecystectomy not indicated at this time -Followed by Dr.Magod [facility], may consider ERCP if she has a  recurrent episode of cholangitis, did not feel that one was warranted at this time. She will be advised to FU with GI in office. -Today is day 6 of IV Zosyn and will be transitioned to Oral levaquin based on enterobacter sensitivities and will continue for 9 more days to complete 14days of antibiotics  2. 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 Acute on Chronic diastolic CHF -improved with Iv lasix x2 doses -resumed PO lasix   4. Diabetes mellitus -resume Amaryl at home, was on sliding scale insulin inpatient  5. History of CVA with hemiplegia/Aphasia -ASA was hold, due to   6. CKD3 -stable  7. Bipolar d/o -on lithium    Consultations:  GI Dr.Magod  CCS  Discharge Exam: Filed Vitals:   02/07/15 1954 02/08/15 0432  BP: 162/52 148/50  Pulse: 79 59  Temp: 98.2 F (36.8 C) 98.5 F (36.9 C)  Resp: 18 18    General: AAOx3 Cardiovascular: S1S2/RRR Respiratory: CTAB L hemiplegia  Discharge Instructions    Current Discharge Medication List    CONTINUE these medications which have NOT CHANGED   Details  acetaminophen (TYLENOL) 500 MG tablet Take 500 mg by mouth every 6 (six) hours as needed for mild pain.    amLODipine (NORVASC) 5 MG tablet Take 1 tablet (5 mg total) by mouth daily. Qty: 30 tablet, Refills: 3    aspirin EC 325 MG EC tablet Take 1 tablet (325 mg  total) by mouth daily. Qty: 30 tablet, Refills: 3    atorvastatin (LIPITOR) 10 MG tablet Take 5 mg by mouth every morning.     cholecalciferol (VITAMIN D) 1000 UNITS tablet Take 2,000 Units by mouth every morning.    conjugated estrogens (PREMARIN) vaginal cream Place 1 Applicatorful vaginally as needed (burning).    Cyanocobalamin (B-12 COMPLIANCE INJECTION IJ) Inject as directed every 30 (thirty) days.    furosemide  (LASIX) 20 MG tablet Take 2 tablets (40 mg total) by mouth daily. Qty: 30 tablet    glimepiride (AMARYL) 1 MG tablet Take 2 tablets (2 mg total) by mouth every morning. Qty: 30 tablet, Refills: 0    lisinopril (PRINIVIL,ZESTRIL) 20 MG tablet Take 1 tablet (20 mg total) by mouth every evening. Qty: 60 tablet, Refills: 0    lithium carbonate (LITHOBID) 300 MG CR tablet Take 150 mg by mouth every morning.    metoprolol (LOPRESSOR) 50 MG tablet Take 1 tablet (50 mg total) by mouth 2 (two) times daily. Qty: 60 tablet, Refills: 0    mirtazapine (REMERON) 30 MG tablet Take 30 mg by mouth every evening.    omeprazole (PRILOSEC) 40 MG capsule Take 40 mg by mouth every evening.     oxybutynin (DITROPAN) 5 MG tablet Take 5 mg by mouth 2 (two) times daily.     pregabalin (LYRICA) 75 MG capsule Take 75 mg by mouth 2 (two) times daily.    traMADol (ULTRAM) 50 MG tablet Take 1 tablet (50 mg total) by mouth 2 (two) times daily as needed (pain). Qty: 30 tablet, Refills: 0    Vilazodone HCl 20 MG TABS Take 20 mg by mouth every morning.    zolpidem (AMBIEN) 5 MG tablet Take 2.5 mg by mouth at bedtime.     nitroGLYCERIN (NITROSTAT) 0.4 MG SL tablet Place 1 tablet (0.4 mg total) under the tongue every 5 (five) minutes as needed for chest pain. Qty: 30 tablet, Refills: 0       Allergies  Allergen Reactions  . Codeine Other (See Comments)    slurred speech      The results of significant diagnostics from this hospitalization (including imaging, microbiology, ancillary and laboratory) are listed below for reference.    Significant Diagnostic Studies: Dg Chest 2 View  01/13/2015  CLINICAL DATA:  Shortness of breath for several days, [place] today with chest heaviness, significant elevated blood pressure on arrival 280/160, stroke last month, personal history of diabetes mellitus, hypertension EXAM: CHEST  2 VIEW COMPARISON:  11/18/2014 FINDINGS: Enlargement of cardiac silhouette with pulmonary  vascular congestion. Atherosclerotic calcification aorta. Bronchitic changes with mild interstitial changes in both lungs slightly increased versus previous exam favor mild pulmonary edema. Lenticular opacity RIGHT mid lung suboptimally visualized on lateral view, could represent pseudotumor from fluid in minor fissure though mass not excluded. No additional pleural effusion or pneumothorax identified. Diffuse osseous demineralization. BILATERAL glenohumeral degenerative changes. IMPRESSION: Enlargement of cardiac silhouette with pulmonary vascular congestion and probable pulmonary edema. Minimal RIGHT basilar atelectasis with lenticular opacity centered at the minor fissure, potentially representing pseudotumor from fluid within minor fissure though mass not entirely excluded. Either CT assessment or radiographic followup until resolution recommended to exclude mass lesion. Electronically Signed   By: Lavonia Dana M.D.   On: 01/13/2015 10:14   Ct Chest Wo Contrast  01/13/2015  CLINICAL DATA:  81 year old diabetic hypertensive female with chronic kidney disease and chest pain with shortness breath for days. Subsequent encounter. EXAM: CT CHEST WITHOUT CONTRAST  TECHNIQUE: Multidetector CT imaging of the chest was performed following the standard protocol without IV contrast. COMPARISON:  01/13/2015 and 11/18/2014 chest x-ray. 04/19/2003 chest CT. FINDINGS: Patchy parenchymal changes right upper lobe within right middle lobe. Septal thickening. Oblong opacity within the minor fissure corresponding to chest x-ray abnormality. Tiny right pleural effusion/ pleural thickening. Mediastinal adenopathy most notable pretracheal region. Findings may reflect result of congestive heart failure with fluid in the minor fissure. Infection cannot be excluded if of high clinical concern. Recommend treating any clinically suspected congestive heart failure with follow-up chest x-ray in the next 4 weeks to confirm that findings  clear as expected for benign process. The minor fissure abnormality would be unusual for a primary lung cancer (developed in a short period time when compared to 11/18/2014). Heart size top-normal. Minimal pericardial fluid. Calcification mitral and aortic valve. Coronary artery calcifications. Calcification thoracic aorta with slight ectasia. Calcification left subclavian artery. Minimal degenerative changes throughout the thoracic spine without compression fracture. Unenhanced visualized upper abdominal structures unremarkable. IMPRESSION: Patchy parenchymal changes right upper lobe within right middle lobe. Septal thickening. Oblong opacity within the minor fissure corresponding to chest x-ray abnormality. Tiny right pleural effusion/ pleural thickening. Mediastinal adenopathy most notable pretracheal region. Findings most suggestive of result of congestive heart failure with fluid in the minor fissure. Infection could not be excluded if of high clinical concern. Recommend treating any clinically suspected congestive heart failure with follow-up chest x-ray in the next 4 weeks. Electronically Signed   By: Genia Del M.D.   On: 01/13/2015 15:24   Ct Abdomen Pelvis W Contrast  02/03/2015  CLINICAL DATA:  Fever, abdominal pain, abnormal LFTs EXAM: CT ABDOMEN AND PELVIS WITH CONTRAST TECHNIQUE: Multidetector CT imaging of the abdomen and pelvis was performed using the standard protocol following bolus administration of intravenous contrast. CONTRAST:  98mL OMNIPAQUE IOHEXOL 300 MG/ML  SOLN COMPARISON:  03/23/2010 FINDINGS: Lower chest:  Mild bibasilar chronic interstitial disease. Hepatobiliary: No focal hepatic mass. Intrahepatic biliary ductal dilatation. Dilated common bile duct to the level of the pancreatic head. Mild pericholecystic fluid or wall thickening. Pancreas: 2.8 X 2.4 cm cystic pancreatic head mass with a punctate mural calcification. Spleen: Normal. Adrenals/Urinary Tract: Normal adrenal  glands. 10 mm hypodense, fluid attenuating left renal mass most consistent with a cyst. Normal right kidney. No obstructive uropathy or urolithiasis. Unremarkable bladder. Stomach/Bowel: Duo dual diverticulum is noted. No bowel wall thickening or dilatation. Diverticulosis of the descending and sigmoid colon without evidence of diverticulitis. Rectal fecal impaction. No pneumatosis, pneumoperitoneum or portal venous gas. No abdominal or pelvic free fluid. Small fat containing umbilical hernia. Vascular/Lymphatic: Normal caliber abdominal aorta with atherosclerosis. No abdominal or pelvic lymphadenopathy. Reproductive: Normal uterus.  No adnexal mass. Other: No fluid collection or hematoma. Musculoskeletal: No acute osseous abnormality. No aggressive lytic or sclerotic osseous lesion. Left hip arthroplasty. Degenerative disc disease throughout the lumbar spine with diffuse facet arthropathy. IMPRESSION: 1. No acute abdominal or pelvic pathology. 2. 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 3. Intrahepatic and extrahepatic biliary ductal dilatation with abrupt narrowing at the pancreatic head. No focal ampullary mass or choledocholithiasis is identified. 4. Mild pericholecystic fluid or wall thickening. This appearance can be seen with cholecystitis. If there is further clinical concern, recommend a right upper quadrant ultrasound. Electronically Signed   By: Kathreen Devoid   On: 02/03/2015 17:03   Mr 3d  Recon At Scanner  02/05/2015  CLINICAL DATA:  Cholangitis with suspected past common duct stone. Cystic pancreatic head lesion. EXAM: MRI ABDOMEN WITHOUT CONTRAST  (INCLUDING MRCP) TECHNIQUE: Multiplanar multisequence MR imaging of the abdomen was performed. Heavily T2-weighted images of the biliary and pancreatic ducts were obtained, and three-dimensional MRCP images were rendered by post processing. COMPARISON:   CT 02/03/2015 and 03/23/2010. FINDINGS: Lower chest: Cardiomegaly and mild atelectasis at both lung bases, similar to recent CT. No significant pleural effusion. Hepatobiliary: The thin section MRCP images are motion degraded. No focal hepatic abnormalities are identified. As demonstrated on recent CT, there is intrahepatic and extrahepatic biliary dilatation. The common bile duct measures up to 1.5 cm in diameter. No intraductal calculi are demonstrated. There is mild gallbladder wall thickening and pericholecystic fluid without discrete gallstones. Pancreas: The pancreatic duct is normal in caliber. There is a cystic lesion within the pancreatic head, measuring 2.8 x 2.4 cm. This contains dependent intrinsic T1 shortening, but otherwise appears simple. This has only minimally enlarged from 2012 and is likely a benign finding. There is an adjacent periampullary duodenal diverticulum. No surrounding inflammatory changes. Spleen: Normal in size without focal abnormality. Adrenals/Urinary Tract: Both adrenal glands appear normal. Both kidneys demonstrate mild cortical thinning and perinephric soft tissue stranding. There are small left renal cysts. No hydronephrosis. Stomach/Bowel: No evidence of bowel wall thickening, distention or surrounding inflammatory change.Minimal ascites and mesenteric edema. Vascular/Lymphatic: There are no enlarged abdominal lymph nodes. Aortoiliac atherosclerosis better demonstrated on CT. Duplication of the IVC. Other: None. Musculoskeletal: Multilevel spondylosis and scoliosis noted. No acute osseous findings. IMPRESSION: 1. Persistent biliary dilatation of undetermined etiology. No evidence of choledocholithiasis. Of note, this biliary dilatation appears chronic and not grossly changed from 2012. 2. Gallbladder wall thickening and pericholecystic fluid may reflect cholecystitis in the appropriate clinical context. 3. Cystic lesion in the pancreatic head demonstrates no aggressive  characteristics and is only mildly enlarged from 2012, likely a benign finding. Electronically Signed   By: Richardean Sale M.D.   On: 02/05/2015 17:32   Dg Chest Port 1 View  02/06/2015  CLINICAL DATA:  Dyspnea and mid sternal chest pain. EXAM: PORTABLE CHEST 1 VIEW COMPARISON:  02/03/2015 FINDINGS: Lung volumes are extremely low bilaterally. Despite low volumes, a degree of interstitial edema is suspected. There may be small bilateral pleural effusions. The heart size is stable. IMPRESSION: Low lung volumes with suspicion of interstitial edema. There may be small bilateral pleural effusions. Electronically Signed   By: Aletta Edouard M.D.   On: 02/06/2015 09:00   Dg Chest Portable 1 View  02/03/2015  CLINICAL DATA:  Fever EXAM: PORTABLE CHEST 1 VIEW COMPARISON:  01/14/2015 FINDINGS: Heart size mildly enlarged. Mild vascular congestion. Negative for edema or effusion. Prominent lung markings are similar to prior studies consistent with mild scarring. IMPRESSION: Mild congestive heart failure without significant edema or effusion. No definite pneumonia. Electronically Signed   By: Franchot Gallo M.D.   On: 02/03/2015 14:18   Dg Chest Port 1 View  01/14/2015  CLINICAL DATA:  CHF and shortness of breath EXAM: PORTABLE CHEST 1 VIEW COMPARISON:  Yesterday FINDINGS: Chronic cardiomegaly. Stable aortic and hilar contours. Re- demonstrated ovoid opacity along the right minor fissure, likely trapped fluid. Persistent diffuse interstitial coarsening which has the appearance of pulmonary edema on prior chest CT. No effusion. No air leak. IMPRESSION: 1. Stable mild CHF. 2. Stable opacity at the minor fissure, likely fissural fluid based on recent CT. Electronically Signed  By: Monte Fantasia M.D.   On: 01/14/2015 06:39   Mr Lambert Mody Cm/mrcp  02/05/2015  CLINICAL DATA:  Cholangitis with suspected past common duct stone. Cystic pancreatic head lesion. EXAM: MRI ABDOMEN WITHOUT CONTRAST  (INCLUDING MRCP) TECHNIQUE:  Multiplanar multisequence MR imaging of the abdomen was performed. Heavily T2-weighted images of the biliary and pancreatic ducts were obtained, and three-dimensional MRCP images were rendered by post processing. COMPARISON:  CT 02/03/2015 and 03/23/2010. FINDINGS: Lower chest: Cardiomegaly and mild atelectasis at both lung bases, similar to recent CT. No significant pleural effusion. Hepatobiliary: The thin section MRCP images are motion degraded. No focal hepatic abnormalities are identified. As demonstrated on recent CT, there is intrahepatic and extrahepatic biliary dilatation. The common bile duct measures up to 1.5 cm in diameter. No intraductal calculi are demonstrated. There is mild gallbladder wall thickening and pericholecystic fluid without discrete gallstones. Pancreas: The pancreatic duct is normal in caliber. There is a cystic lesion within the pancreatic head, measuring 2.8 x 2.4 cm. This contains dependent intrinsic T1 shortening, but otherwise appears simple. This has only minimally enlarged from 2012 and is likely a benign finding. There is an adjacent periampullary duodenal diverticulum. No surrounding inflammatory changes. Spleen: Normal in size without focal abnormality. Adrenals/Urinary Tract: Both adrenal glands appear normal. Both kidneys demonstrate mild cortical thinning and perinephric soft tissue stranding. There are small left renal cysts. No hydronephrosis. Stomach/Bowel: No evidence of bowel wall thickening, distention or surrounding inflammatory change.Minimal ascites and mesenteric edema. Vascular/Lymphatic: There are no enlarged abdominal lymph nodes. Aortoiliac atherosclerosis better demonstrated on CT. Duplication of the IVC. Other: None. Musculoskeletal: Multilevel spondylosis and scoliosis noted. No acute osseous findings. IMPRESSION: 1. Persistent biliary dilatation of undetermined etiology. No evidence of choledocholithiasis. Of note, this biliary dilatation appears chronic  and not grossly changed from 2012. 2. Gallbladder wall thickening and pericholecystic fluid may reflect cholecystitis in the appropriate clinical context. 3. Cystic lesion in the pancreatic head demonstrates no aggressive characteristics and is only mildly enlarged from 2012, likely a benign finding. Electronically Signed   By: Richardean Sale M.D.   On: 02/05/2015 17:32    Microbiology: Recent Results (from the past 240 hour(s))  Blood culture (routine x 2)     Status: None   Collection Time: 02/03/15  1:35 PM  Result Value Ref Range Status   Specimen Description BLOOD RIGHT ARM  Final   Special Requests BOTTLES DRAWN AEROBIC AND ANAEROBIC 5CC  Final   Culture  Setup Time   Final    GRAM NEGATIVE RODS IN BOTH AEROBIC AND ANAEROBIC BOTTLES CRITICAL RESULT CALLED TO, READ BACK BY AND VERIFIED WITH: B REAP @0202   02/04/15 MKELLY    Culture ENTEROBACTER CLOACAE  Final   Report Status 02/06/2015 FINAL  Final   Organism ID, Bacteria ENTEROBACTER CLOACAE  Final      Susceptibility   Enterobacter cloacae - MIC*    CEFAZOLIN <=4 RESISTANT Resistant     CEFEPIME <=1 SENSITIVE Sensitive     CEFTAZIDIME <=1 SENSITIVE Sensitive     CEFTRIAXONE <=1 SENSITIVE Sensitive     CIPROFLOXACIN <=0.25 SENSITIVE Sensitive     GENTAMICIN <=1 SENSITIVE Sensitive     IMIPENEM <=0.25 SENSITIVE Sensitive     TRIMETH/SULFA <=20 SENSITIVE Sensitive     PIP/TAZO <=4 SENSITIVE Sensitive     * ENTEROBACTER CLOACAE  Blood culture (routine x 2)     Status: None   Collection Time: 02/03/15  2:10 PM  Result Value Ref Range Status  Specimen Description BLOOD LEFT HAND  Final   Special Requests BOTTLES DRAWN AEROBIC AND ANAEROBIC 5CC  Final   Culture  Setup Time   Final    GRAM NEGATIVE RODS AEROBIC BOTTLE ONLY CRITICAL RESULT CALLED TO, READ BACK BY AND VERIFIED WITH: A PETTIFORD @0552  02/05/15 MKELLY    Culture   Final    ENTEROBACTER CLOACAE SUSCEPTIBILITIES PERFORMED ON PREVIOUS CULTURE WITHIN THE LAST 5  DAYS.    Report Status 02/07/2015 FINAL  Final  MRSA PCR Screening     Status: None   Collection Time: 02/03/15 10:13 PM  Result Value Ref Range Status   MRSA by PCR NEGATIVE NEGATIVE Final    Comment:        The GeneXpert MRSA Assay (FDA approved for NASAL specimens only), is one component of a comprehensive MRSA colonization surveillance program. It is not intended to diagnose MRSA infection nor to guide or monitor treatment for MRSA infections.      Labs: Basic Metabolic Panel:  Recent Labs Lab 02/03/15 1335 02/04/15 0428 02/05/15 0408 02/06/15 0614 02/07/15 0526  NA 143 143 146* 143 143  K 3.6 3.1* 3.5 3.5 3.8  CL 105 106 112* 108 104  CO2 20* 26 25 25  32  GLUCOSE 261* 193* 112* 207* 162*  BUN 11 12 10 9  5*  CREATININE 1.19* 1.29* 1.02* 1.10* 1.11*  CALCIUM 9.3 8.3* 8.4* 9.1 9.0   Liver Function Tests:  Recent Labs Lab 02/03/15 1335 02/04/15 0428 02/05/15 0408 02/06/15 0614 02/07/15 0526  AST 441* 276* 90* 40 18  ALT 136* 189* 115* 88* 52  ALKPHOS 233* 171* 144* 152* 125  BILITOT 3.0* 3.2* 1.7* 1.3* 1.0  PROT 6.6 5.0* 5.6* 6.5 5.8*  ALBUMIN 3.5 2.6* 2.5* 3.0* 2.6*    Recent Labs Lab 02/03/15 1702  LIPASE 32   No results for input(s): AMMONIA in the last 168 hours. CBC:  Recent Labs Lab 02/03/15 1335 02/04/15 0428 02/05/15 0408 02/06/15 0920 02/07/15 0526  WBC 8.3 13.8* 7.5 7.4 5.1  NEUTROABS 7.9*  --   --   --   --   HGB 13.3 10.7* 10.3* 12.0 11.3*  HCT 42.2 34.5* 33.5* 38.3 35.4*  MCV 90.8 90.1 91.3 89.3 89.4  PLT 199 160 126* 139* 135*   Cardiac Enzymes:  Recent Labs Lab 02/03/15 1335  TROPONINI <0.03   BNP: BNP (last 3 results)  Recent Labs  07/18/14 1216 01/13/15 0926 01/14/15 1040  BNP 250.6* 173.8* 45.0    ProBNP (last 3 results) No results for input(s): PROBNP in the last 8760 hours.  CBG:  Recent Labs Lab 02/07/15 1952 02/07/15 2358 02/08/15 0428 02/08/15 0849 02/08/15 1140  GLUCAP 211* 153* 187*  168* 238*       Signed:  Juell Radney  [facility] 02/08/2015, 12:23 PM

## 2015-02-08 NOTE — Progress Notes (Signed)
PT Cancellation Note  Patient Details Name: [name] MRN: [id number] DOB: [birth year]   Cancelled Treatment:    Reason Eval/Treat Not Completed: Medical issues which prohibited therapy Strict bed rest order. MD paged. Will check back later for PT evaluation.  [name] 02/08/2015, 11:54 AM  Elayne Snare, [place]

## 2015-02-08 NOTE — Progress Notes (Signed)
Patient ID: [name], female   DOB: 1929-06-19, 81 y.o.   MRN: [id number]    Subjective: Pt c/o pretty bad nausea the last 2 days, unable to eat due to nausea.  States it seems better so far today.  Wants to go home.  Objective: Vital signs in last 24 hours: Temp:  [98.2 F (36.8 C)-99.3 F (37.4 C)] 98.5 F (36.9 C) (12/06 0432) Pulse Rate:  [59-79] 59 (12/06 0432) Resp:  [18-20] 18 (12/06 0432) BP: (119-162)/(50-93) 148/50 mmHg (12/06 0432) SpO2:  [99 %-100 %] 99 % (12/06 0432) Weight:  [76.4 kg (168 lb 6.9 oz)] 76.4 kg (168 lb 6.9 oz) (12/05 1954) Last BM Date: 02/07/15  Intake/Output from previous day: 12/05 0701 - 12/06 0700 In: 630 [P.O.:240; I.V.:240; IV Piggyback:150] Out: 900 [Urine:900] Intake/Output this shift:    PE: Abd: soft, minimally tender throughout upper abdomen, most "sore" in LUQ, +BS, ND HEart: regular LungS: CTAB  Lab Results:   Recent Labs  02/06/15 0920 02/07/15 0526  WBC 7.4 5.1  HGB 12.0 11.3*  HCT 38.3 35.4*  PLT 139* 135*   BMET  Recent Labs  02/06/15 0614 02/07/15 0526  NA 143 143  K 3.5 3.8  CL 108 104  CO2 25 32  GLUCOSE 207* 162*  BUN 9 5*  CREATININE 1.10* 1.11*  CALCIUM 9.1 9.0   PT/INR No results for input(s): LABPROT, INR in the last 72 hours. CMP     Component Value Date/Time   NA 143 02/07/2015 0526   K 3.8 02/07/2015 0526   CL 104 02/07/2015 0526   CO2 32 02/07/2015 0526   GLUCOSE 162* 02/07/2015 0526   BUN 5* 02/07/2015 0526   CREATININE 1.11* 02/07/2015 0526   CALCIUM 9.0 02/07/2015 0526   PROT 5.8* 02/07/2015 0526   ALBUMIN 2.6* 02/07/2015 0526   AST 18 02/07/2015 0526   ALT 52 02/07/2015 0526   ALKPHOS 125 02/07/2015 0526   BILITOT 1.0 02/07/2015 0526   GFRNONAA 45* 02/07/2015 0526   GFRAA 52* 02/07/2015 0526   Lipase     Component Value Date/Time   LIPASE 32 02/03/2015 1702       Studies/Results: Dg Chest Port 1 View  02/06/2015  CLINICAL DATA:  Dyspnea and mid sternal chest pain.  EXAM: PORTABLE CHEST 1 VIEW COMPARISON:  02/03/2015 FINDINGS: Lung volumes are extremely low bilaterally. Despite low volumes, a degree of interstitial edema is suspected. There may be small bilateral pleural effusions. The heart size is stable. IMPRESSION: Low lung volumes with suspicion of interstitial edema. There may be small bilateral pleural effusions. Electronically Signed   By: Aletta Edouard M.D.   On: 02/06/2015 09:00    Anti-infectives: Anti-infectives    Start     Dose/Rate Route Frequency Ordered Stop   02/04/15 1430  vancomycin (VANCOCIN) IVPB 1000 mg/200 mL premix  Status:  Discontinued     1,000 mg 200 mL/hr over 60 Minutes Intravenous Every 24 hours 02/03/15 1444 02/03/15 2015   02/04/15 0900  piperacillin-tazobactam (ZOSYN) IVPB 3.375 g     3.375 g 12.5 mL/hr over 240 Minutes Intravenous Every 8 hours 02/04/15 0841     02/03/15 2030  piperacillin-tazobactam (ZOSYN) IVPB 3.375 g     3.375 g 100 mL/hr over 30 Minutes Intravenous  Once 02/03/15 2019 02/03/15 2347   02/03/15 1400  vancomycin (VANCOCIN) 1,500 mg in sodium chloride 0.9 % 500 mL IVPB     1,500 mg 250 mL/hr over 120 Minutes Intravenous STAT 02/03/15  1349 02/03/15 2007   02/03/15 1345  piperacillin-tazobactam (ZOSYN) IVPB 3.375 g     3.375 g 100 mL/hr over 30 Minutes Intravenous  Once 02/03/15 1334 02/03/15 1515       Assessment/Plan   1. Pancreatic head cyst with biliary dilatation/elevated LFTs -Dr. Hulen Skains would only be agreeable to proceed with a lap chole if patient had pre-operative ERCP to define anatomy and better evaluate her ducts prior to surgical intervention.  We do not currently feel as if the patient has cholecystitis.  The patient would like to try and eat and wants to go home.  -see how she does with her diet today.  LOS: 5 days    Esparanza Krider E 02/08/2015, 7:54 AM Pager: [phone]

## 2015-02-08 NOTE — Progress Notes (Signed)
Called by pt's daughter stating pt called her in tears because of back and chest pain. RN and NT checked on pt who was sitting in chair. Asked pt to show me where her pain was and she pointed to her lower left abdomen. Vitals taken and stable. Pt assisted back in bed and given pain medicine. Dr. Broadus John notified. Will continue to monitor.

## 2015-02-08 NOTE — Evaluation (Signed)
Physical Therapy Evaluation Patient Details Name: [name] MRN: [id number] DOB: 02-24-1930 Today's Date: 02/08/2015   History of Present Illness  [name] is a 81 y.o. female who presents to the ED with c/o abdominal pain, emesis. Pt with Cholangitis. PMH: CHF, DM, CVA, bipolar d/o.   Clinical Impression  Pt admitted with the above complications. Pt currently with functional limitations due to the deficits listed below (see PT Problem List). Fatigues easily, SpO2 87% on room air with short distance ambulation, improves rapidly upon sitting to mid-upper 90s. Good walker control with cues. Apparently has strong family support from husband and an aide that assists with bathing 2x/week. Encouraged to use RW at home at all times. Verbalized understanding. States she has been making good progress functionally with HHPT and I believe she would greatly benefit with continuation of these services. Pt will benefit from skilled PT to increase their independence and safety with mobility to allow discharge to the venue listed below.       Follow Up Recommendations Home health PT;Supervision/Assistance - 24 hour    Equipment Recommendations  None recommended by PT    Recommendations for Other Services       Precautions / Restrictions Precautions Precautions: Fall Restrictions Weight Bearing Restrictions: No      Mobility  Bed Mobility Overal bed mobility: Needs Assistance Bed Mobility: Supine to Sit     Supine to sit: Min assist;HOB elevated     General bed mobility comments: Min assist to scoot to EOB by pulling through PTs hand. VC for technique. Required a considerable amount of time until she allowed for physical assist.  Transfers Overall transfer level: Needs assistance Equipment used: Rolling walker (2 wheeled) Transfers: Sit to/from Stand Sit to Stand: Min guard         General transfer comment: Min guard for safety. VC for hand placement but stable once upright. Good LE  boost to stand without assistance to rise.  Ambulation/Gait Ambulation/Gait assistance: Min guard Ambulation Distance (Feet): 65 Feet Assistive device: Rolling walker (2 wheeled) Gait Pattern/deviations: Step-through pattern;Decreased stride length;Trunk flexed Gait velocity: decreased Gait velocity interpretation: Below normal speed for age/gender General Gait Details: Intermittent cues for upright posture. Responds well to cues for walker placement for proximity and forward gaze. No buckling or overt loss of balance. 3/4 dyspnea, with SpO2 87% on room air at end of distance, improves to 96% with seated rest break and cues for pursed lip breathing.  Stairs            Wheelchair Mobility    Modified Rankin (Stroke Patients Only)       Balance Overall balance assessment: Needs assistance Sitting-balance support: No upper extremity supported Sitting balance-Leahy Scale: Good     Standing balance support: Bilateral upper extremity supported Standing balance-Leahy Scale: Poor                               Pertinent Vitals/Pain Pain Assessment: 0-10 Pain Score: 6  Pain Location: back and radiates to legs Pain Descriptors / Indicators: Aching Pain Intervention(s): Monitored during session;Repositioned    Home Living Family/patient expects to be discharged to:: Private residence Living Arrangements: Spouse/significant other Available Help at Discharge: Family;Personal care attendant;Available 24 hours/day (aide 2x/week) Type of Home: House Home Access: Ramped entrance     Home Layout: Two level;Able to live on main level with bedroom/bathroom Home Equipment: Gilford Rile - 2 wheels;Shower seat;Bedside commode;Wheelchair - [employer] -  4 wheels;Hand held shower head      Prior Function Level of Independence: Needs assistance   Gait / Transfers Assistance Needed: uses RW by herself (different report from what was expressed to OT evaluation earlier)  ADL's /  Homemaking Assistance Needed: Aide assists with bathing, husband assists with daily tasks.        Hand Dominance   Dominant Hand: Right    Extremity/Trunk Assessment   Upper Extremity Assessment: Defer to OT evaluation           Lower Extremity Assessment: Generalized weakness         Communication   Communication: Expressive difficulties  Cognition Arousal/Alertness: Awake/alert Behavior During Therapy: WFL for tasks assessed/performed Overall Cognitive Status: Within Functional Limits for tasks assessed                      General Comments General comments (skin integrity, edema, etc.): States she has been making good progress at home with HHPT. Has fallen at home in the past several months. Each time due to tripping or losing balance while not using her RW. Educated on importance of RW use for support.    Exercises        Assessment/Plan    PT Assessment Patient needs continued PT services  PT Diagnosis Difficulty walking;Abnormality of gait;Generalized weakness   PT Problem List Decreased strength;Decreased range of motion;Decreased activity tolerance;Decreased balance;Decreased mobility;Decreased knowledge of use of DME;Cardiopulmonary status limiting activity;Pain  PT Treatment Interventions DME instruction;Gait training;Functional mobility training;Therapeutic exercise;Therapeutic activities;Balance training;Neuromuscular re-education;Patient/family education   PT Goals (Current goals can be found in the Care Plan section) Acute Rehab PT Goals Patient Stated Goal: Go home soon PT Goal Formulation: With patient Time For Goal Achievement: 02/22/15 Potential to Achieve Goals: Good    Frequency Min 3X/week   Barriers to discharge        Co-evaluation               End of Session Equipment Utilized During Treatment: Gait belt Activity Tolerance: Patient tolerated treatment well;Patient limited by fatigue Patient left: in chair;with call  bell/phone within reach;with chair alarm set Nurse Communication: Mobility status         Time: 1331-1350 PT Time Calculation (min) (ACUTE ONLY): 19 min   Charges:   PT Evaluation $Initial PT Evaluation Tier I: 1 Procedure     PT G CodesEllouise Newer 02/08/2015, 2:46 PM Camille Bal [place], [place]

## 2015-02-09 DIAGNOSIS — I10 Essential (primary) hypertension: Secondary | ICD-10-CM

## 2015-02-09 DIAGNOSIS — A419 Sepsis, unspecified organism: Secondary | ICD-10-CM

## 2015-02-09 DIAGNOSIS — I5032 Chronic diastolic (congestive) heart failure: Secondary | ICD-10-CM

## 2015-02-09 DIAGNOSIS — K830 Cholangitis: Secondary | ICD-10-CM

## 2015-02-09 LAB — COMPREHENSIVE METABOLIC PANEL
ALBUMIN: 3 g/dL — AB (ref 3.5–5.0)
ALK PHOS: 118 U/L (ref 38–126)
ALT: 30 U/L (ref 14–54)
ANION GAP: 11 (ref 5–15)
AST: 14 U/L — ABNORMAL LOW (ref 15–41)
BUN: 8 mg/dL (ref 6–20)
CALCIUM: 9.7 mg/dL (ref 8.9–10.3)
CO2: 33 mmol/L — AB (ref 22–32)
Chloride: 98 mmol/L — ABNORMAL LOW (ref 101–111)
Creatinine, Ser: 1.08 mg/dL — ABNORMAL HIGH (ref 0.44–1.00)
GFR calc non Af Amer: 46 mL/min — ABNORMAL LOW (ref >60)
GFR, EST AFRICAN AMERICAN: 53 mL/min — AB (ref >60)
GLUCOSE: 150 mg/dL — AB (ref 65–99)
POTASSIUM: 3.6 mmol/L (ref 3.5–5.1)
SODIUM: 142 mmol/L (ref 135–145)
Total Bilirubin: 0.9 mg/dL (ref 0.3–1.2)
Total Protein: 6.4 g/dL — ABNORMAL LOW (ref 6.5–8.1)

## 2015-02-09 LAB — CBC
HCT: 40.5 % (ref 36.0–46.0)
Hemoglobin: 12.5 g/dL (ref 12.0–15.0)
MCH: 28.2 pg (ref 26.0–34.0)
MCHC: 30.9 g/dL (ref 30.0–36.0)
MCV: 91.4 fL (ref 78.0–100.0)
PLATELETS: 159 10*3/uL (ref 150–400)
RBC: 4.43 MIL/uL (ref 3.87–5.11)
RDW: 12.7 % (ref 11.5–15.5)
WBC: 5.5 10*3/uL (ref 4.0–10.5)

## 2015-02-09 LAB — GLUCOSE, CAPILLARY
GLUCOSE-CAPILLARY: 167 mg/dL — AB (ref 65–99)
GLUCOSE-CAPILLARY: 178 mg/dL — AB (ref 65–99)
Glucose-Capillary: 147 mg/dL — ABNORMAL HIGH (ref 65–99)

## 2015-02-09 NOTE — Progress Notes (Signed)
Patient ID: [name], female   DOB: [birth year], 81 y.o.   MRN: [id number]    Subjective: Pt states right before DC home yesterday she developed some very lateral left upper quadrant/flank pain.  It resolved on its own and is not present today.  She denies abdominal pain today.  She does states she is not eating much due to nausea, but is able to eat some.  Objective: Vital signs in last 24 hours: Temp:  [97.9 F (36.6 C)-98.8 F (37.1 C)] 97.9 F (36.6 C) (12/07 0500) Pulse Rate:  [64-82] 67 (12/07 0500) Resp:  [16-18] 18 (12/07 0500) BP: (127-160)/(45-59) 160/50 mmHg (12/07 0500) SpO2:  [95 %-100 %] 100 % (12/07 0500) Weight:  [76.8 kg (169 lb 5 oz)] 76.8 kg (169 lb 5 oz) (12/06 2011) Last BM Date: 02/08/15  Intake/Output from previous day: 12/06 0701 - 12/07 0700 In: 960 [P.O.:720; I.V.:240] Out: 1078 [Urine:1075; Stool:3] Intake/Output this shift:    PE: Abd: soft, NT, ND, +BS  Lab Results:   Recent Labs  02/07/15 0526 02/09/15 0544  WBC 5.1 5.5  HGB 11.3* 12.5  HCT 35.4* 40.5  PLT 135* 159   BMET  Recent Labs  02/07/15 0526 02/09/15 0544  NA 143 142  K 3.8 3.6  CL 104 98*  CO2 32 33*  GLUCOSE 162* 150*  BUN 5* 8  CREATININE 1.11* 1.08*  CALCIUM 9.0 9.7   PT/INR No results for input(s): LABPROT, INR in the last 72 hours. CMP     Component Value Date/Time   NA 142 02/09/2015 0544   K 3.6 02/09/2015 0544   CL 98* 02/09/2015 0544   CO2 33* 02/09/2015 0544   GLUCOSE 150* 02/09/2015 0544   BUN 8 02/09/2015 0544   CREATININE 1.08* 02/09/2015 0544   CALCIUM 9.7 02/09/2015 0544   PROT 6.4* 02/09/2015 0544   ALBUMIN 3.0* 02/09/2015 0544   AST 14* 02/09/2015 0544   ALT 30 02/09/2015 0544   ALKPHOS 118 02/09/2015 0544   BILITOT 0.9 02/09/2015 0544   GFRNONAA 46* 02/09/2015 0544   GFRAA 53* 02/09/2015 0544   Lipase     Component Value Date/Time   LIPASE 32 02/03/2015 1702       Studies/Results: No results  found.  Anti-infectives: Anti-infectives    Start     Dose/Rate Route Frequency Ordered Stop   02/08/15 1000  levofloxacin (LEVAQUIN) tablet 750 mg     750 mg Oral Every 48 hours 02/08/15 0956 02/12/15 0959   02/08/15 0000  levofloxacin (LEVAQUIN) 750 MG tablet     750 mg Oral Every 48 hours 02/08/15 1232     02/04/15 1430  vancomycin (VANCOCIN) IVPB 1000 mg/200 mL premix  Status:  Discontinued     1,000 mg 200 mL/hr over 60 Minutes Intravenous Every 24 hours 02/03/15 1444 02/03/15 2015   02/04/15 0900  piperacillin-tazobactam (ZOSYN) IVPB 3.375 g  Status:  Discontinued     3.375 g 12.5 mL/hr over 240 Minutes Intravenous Every 8 hours 02/04/15 0841 02/08/15 0951   02/03/15 2030  piperacillin-tazobactam (ZOSYN) IVPB 3.375 g     3.375 g 100 mL/hr over 30 Minutes Intravenous  Once 02/03/15 2019 02/03/15 2347   02/03/15 1400  vancomycin (VANCOCIN) 1,500 mg in sodium chloride 0.9 % 500 mL IVPB     1,500 mg 250 mL/hr over 120 Minutes Intravenous STAT 02/03/15 1349 02/03/15 2007   02/03/15 1345  piperacillin-tazobactam (ZOSYN) IVPB 3.375 g     3.375 g 100 mL/hr  over 30 Minutes Intravenous  Once 02/03/15 1334 02/03/15 1515       Assessment/Plan 1. Pancreatic head cyst with biliary dilatation/elevated LFTs -ok from our standpoint for DC home as she does not have evidence of cholecystitis.  She is having nausea and has minimal oral intake.  Prn anti-emetics and protein supplements to help supplement her oral intake. -we would recommend she get a preop ERCP for outline of her anatomy prior to any type of surgical procedure. -no plans for intervention this admission.  We will sign off.    LOS: 6 days    Eliam Snapp E 02/09/2015, 8:10 AM Pager: [phone]

## 2015-02-09 NOTE — Discharge Summary (Signed)
Physician Discharge Summary  [name] [id number] DOB: November 09, 1929 DOA: 02/03/2015  PCP: Irven Shelling, MD  Admit date: 02/03/2015 Discharge date: 02/09/2015  Time spent: 30 minutes  Recommendations for Outpatient Follow-up:  1. Dr.Magod or Outlaw with [facility] in 2 weeks, consideration for ERCP/EUS 2. PCP in 1 week 3. Follow up with home health PT and RN 4. Follow up with surgery as needed and as recommended.    Discharge Diagnoses:  Principal Problem:   Cholangitis Active Problems:   Type 2 diabetes mellitus with hyperglycemia (HCC)   Chronic diastolic heart failure (HCC)   Essential hypertension, malignant   Sepsis ([place])   Discharge Condition: improved  Diet recommendation: carb modified diet.   Filed Weights   02/06/15 2015 02/07/15 1954 02/08/15 2011  Weight: 81.035 kg (178 lb 10.4 oz) 76.4 kg (168 lb 6.9 oz) 76.8 kg (169 lb 5 oz)    History of present illness:   [name] is a 81 y.o. female  presents to the ED with c/o abdominal pain, emesis since one day. Ct showed  Cystic lesion int he pancreatic head, causing some compression . GI and surgery consulted and recommendations given.  She was also found to be septic on admission growing enterobactere bacteremia , is being discharged on levaquin to complete the course.   Hospital Course:  1. Cholangitis -Clinically suspect passed stone however she also has a cystic lesion in the pancreatic head, which could be causing extrinsic compression, Ca 19-9 mildly elevated, but clinically not impressive per Gi Dr.Magod - treated with IV Zosyn, Blood cultures grew Enterobacter bacteremia - MRCP without obvious panc CA, ? Cholecystitis,  -per Surgery she doesn't have active cholecystitis and primary problem is CBD partial obstruction/dilation hence HIDA/Cholecystectomy not indicated at this time -Followed by Dr.Magod [facility], may consider ERCP if she has a recurrent episode of cholangitis, did not feel that one was  warranted at this time. She will be advised to FU with GI in office. -Today is day 6 of IV Zosyn and will be transitioned to Oral levaquin based on enterobacter sensitivities and will continue for 9 more days to complete 14days of antibiotics  2. 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 Acute on Chronic diastolic CHF -improved with Iv lasix x2 doses -resumed PO lasix   4. Diabetes mellitus -resume Amaryl at home, was on sliding scale insulin inpatient  5. History of CVA with hemiplegia/Aphasia -ASA was hold, due to   6. CKD3 -stable  7. Bipolar d/o -on lithium  Procedures:  NONE   Consultations:  Surgery  Physical therapy  GI  Discharge Exam: Filed Vitals:   02/08/15 2011 02/09/15 0500  BP: 143/45 160/50  Pulse: 82 67  Temp: 98.8 F (37.1 C) 97.9 F (36.6 C)  Resp: 18 18    General: alert and comfortable sitting in the chair Cardiovascular: s1s2 Respiratory: ctab  Discharge Instructions   Discharge Instructions    Diet - low sodium heart healthy    Complete by:  As directed      Discharge instructions    Complete by:  As directed   Please follow up with PCP in 1 to2 weeks post hospitalization visit.  Please see surgery as needed.     Increase activity slowly    Complete by:  As directed  Current Discharge Medication List    START taking these medications   Details  levofloxacin (LEVAQUIN) 750 MG tablet Take 1 tablet (750 mg total) by mouth every other day. For 9 days Qty: 5 tablet, Refills: 0      CONTINUE these medications which have CHANGED   Details  furosemide (LASIX) 20 MG tablet Take 2 tablets (40 mg total) by mouth daily. Qty: 30 tablet      CONTINUE these medications which have NOT CHANGED   Details  acetaminophen (TYLENOL) 500 MG tablet  Take 500 mg by mouth every 6 (six) hours as needed for mild pain.    amLODipine (NORVASC) 5 MG tablet Take 1 tablet (5 mg total) by mouth daily. Qty: 30 tablet, Refills: 3    aspirin EC 325 MG EC tablet Take 1 tablet (325 mg total) by mouth daily. Qty: 30 tablet, Refills: 3    atorvastatin (LIPITOR) 10 MG tablet Take 5 mg by mouth every morning.     cholecalciferol (VITAMIN D) 1000 UNITS tablet Take 2,000 Units by mouth every morning.    conjugated estrogens (PREMARIN) vaginal cream Place 1 Applicatorful vaginally as needed (burning).    Cyanocobalamin (B-12 COMPLIANCE INJECTION IJ) Inject as directed every 30 (thirty) days.    glimepiride (AMARYL) 1 MG tablet Take 2 tablets (2 mg total) by mouth every morning. Qty: 30 tablet, Refills: 0    lisinopril (PRINIVIL,ZESTRIL) 20 MG tablet Take 1 tablet (20 mg total) by mouth every evening. Qty: 60 tablet, Refills: 0    lithium carbonate (LITHOBID) 300 MG CR tablet Take 150 mg by mouth every morning.    metoprolol (LOPRESSOR) 50 MG tablet Take 1 tablet (50 mg total) by mouth 2 (two) times daily. Qty: 60 tablet, Refills: 0    mirtazapine (REMERON) 30 MG tablet Take 30 mg by mouth every evening.    omeprazole (PRILOSEC) 40 MG capsule Take 40 mg by mouth every evening.     oxybutynin (DITROPAN) 5 MG tablet Take 5 mg by mouth 2 (two) times daily.     pregabalin (LYRICA) 75 MG capsule Take 75 mg by mouth 2 (two) times daily.    traMADol (ULTRAM) 50 MG tablet Take 1 tablet (50 mg total) by mouth 2 (two) times daily as needed (pain). Qty: 30 tablet, Refills: 0    Vilazodone HCl 20 MG TABS Take 20 mg by mouth every morning.    zolpidem (AMBIEN) 5 MG tablet Take 2.5 mg by mouth at bedtime.     nitroGLYCERIN (NITROSTAT) 0.4 MG SL tablet Place 1 tablet (0.4 mg total) under the tongue every 5 (five) minutes as needed for chest pain. Qty: 30 tablet, Refills: 0       Allergies  Allergen Reactions  . Codeine Other (See Comments)     slurred speech      The results of significant diagnostics from this hospitalization (including imaging, microbiology, ancillary and laboratory) are listed below for reference.    Significant Diagnostic Studies: Dg Chest 2 View  01/13/2015  CLINICAL DATA:  Shortness of breath for several days, [place] today with chest heaviness, significant elevated blood pressure on arrival 280/160, stroke last month, personal history of diabetes mellitus, hypertension EXAM: CHEST  2 VIEW COMPARISON:  11/18/2014 FINDINGS: Enlargement of cardiac silhouette with pulmonary vascular congestion. Atherosclerotic calcification aorta. Bronchitic changes with mild interstitial changes in both lungs slightly increased versus previous exam favor mild pulmonary edema. Lenticular opacity RIGHT mid lung suboptimally visualized on lateral view, could represent pseudotumor from  fluid in minor fissure though mass not excluded. No additional pleural effusion or pneumothorax identified. Diffuse osseous demineralization. BILATERAL glenohumeral degenerative changes. IMPRESSION: Enlargement of cardiac silhouette with pulmonary vascular congestion and probable pulmonary edema. Minimal RIGHT basilar atelectasis with lenticular opacity centered at the minor fissure, potentially representing pseudotumor from fluid within minor fissure though mass not entirely excluded. Either CT assessment or radiographic followup until resolution recommended to exclude mass lesion. Electronically Signed   By: Lavonia Dana M.D.   On: 01/13/2015 10:14   Ct Chest Wo Contrast  01/13/2015  CLINICAL DATA:  81 year old diabetic hypertensive female with chronic kidney disease and chest pain with shortness breath for days. Subsequent encounter. EXAM: CT CHEST WITHOUT CONTRAST TECHNIQUE: Multidetector CT imaging of the chest was performed following the standard protocol without IV contrast. COMPARISON:  01/13/2015 and 11/18/2014 chest x-ray. 04/19/2003 chest CT. FINDINGS:  Patchy parenchymal changes right upper lobe within right middle lobe. Septal thickening. Oblong opacity within the minor fissure corresponding to chest x-ray abnormality. Tiny right pleural effusion/ pleural thickening. Mediastinal adenopathy most notable pretracheal region. Findings may reflect result of congestive heart failure with fluid in the minor fissure. Infection cannot be excluded if of high clinical concern. Recommend treating any clinically suspected congestive heart failure with follow-up chest x-ray in the next 4 weeks to confirm that findings clear as expected for benign process. The minor fissure abnormality would be unusual for a primary lung cancer (developed in a short period time when compared to 11/18/2014). Heart size top-normal. Minimal pericardial fluid. Calcification mitral and aortic valve. Coronary artery calcifications. Calcification thoracic aorta with slight ectasia. Calcification left subclavian artery. Minimal degenerative changes throughout the thoracic spine without compression fracture. Unenhanced visualized upper abdominal structures unremarkable. IMPRESSION: Patchy parenchymal changes right upper lobe within right middle lobe. Septal thickening. Oblong opacity within the minor fissure corresponding to chest x-ray abnormality. Tiny right pleural effusion/ pleural thickening. Mediastinal adenopathy most notable pretracheal region. Findings most suggestive of result of congestive heart failure with fluid in the minor fissure. Infection could not be excluded if of high clinical concern. Recommend treating any clinically suspected congestive heart failure with follow-up chest x-ray in the next 4 weeks. Electronically Signed   By: Genia Del M.D.   On: 01/13/2015 15:24   Ct Abdomen Pelvis W Contrast  02/03/2015  CLINICAL DATA:  Fever, abdominal pain, abnormal LFTs EXAM: CT ABDOMEN AND PELVIS WITH CONTRAST TECHNIQUE: Multidetector CT imaging of the abdomen and pelvis was performed  using the standard protocol following bolus administration of intravenous contrast. CONTRAST:  53mL OMNIPAQUE IOHEXOL 300 MG/ML  SOLN COMPARISON:  03/23/2010 FINDINGS: Lower chest:  Mild bibasilar chronic interstitial disease. Hepatobiliary: No focal hepatic mass. Intrahepatic biliary ductal dilatation. Dilated common bile duct to the level of the pancreatic head. Mild pericholecystic fluid or wall thickening. Pancreas: 2.8 X 2.4 cm cystic pancreatic head mass with a punctate mural calcification. Spleen: Normal. Adrenals/Urinary Tract: Normal adrenal glands. 10 mm hypodense, fluid attenuating left renal mass most consistent with a cyst. Normal right kidney. No obstructive uropathy or urolithiasis. Unremarkable bladder. Stomach/Bowel: Duo dual diverticulum is noted. No bowel wall thickening or dilatation. Diverticulosis of the descending and sigmoid colon without evidence of diverticulitis. Rectal fecal impaction. No pneumatosis, pneumoperitoneum or portal venous gas. No abdominal or pelvic free fluid. Small fat containing umbilical hernia. Vascular/Lymphatic: Normal caliber abdominal aorta with atherosclerosis. No abdominal or pelvic lymphadenopathy. Reproductive: Normal uterus.  No adnexal mass. Other: No fluid collection or hematoma. Musculoskeletal: No acute osseous  abnormality. No aggressive lytic or sclerotic osseous lesion. Left hip arthroplasty. Degenerative disc disease throughout the lumbar spine with diffuse facet arthropathy. IMPRESSION: 1. No acute abdominal or pelvic pathology. 2. 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 3. Intrahepatic and extrahepatic biliary ductal dilatation with abrupt narrowing at the pancreatic head. No focal ampullary mass or choledocholithiasis is identified. 4. Mild pericholecystic fluid or wall thickening. This appearance can be seen with cholecystitis. If there is further  clinical concern, recommend a right upper quadrant ultrasound. Electronically Signed   By: Kathreen Devoid   On: 02/03/2015 17:03   Mr 3d Recon At Scanner  02/05/2015  CLINICAL DATA:  Cholangitis with suspected past common duct stone. Cystic pancreatic head lesion. EXAM: MRI ABDOMEN WITHOUT CONTRAST  (INCLUDING MRCP) TECHNIQUE: Multiplanar multisequence MR imaging of the abdomen was performed. Heavily T2-weighted images of the biliary and pancreatic ducts were obtained, and three-dimensional MRCP images were rendered by post processing. COMPARISON:  CT 02/03/2015 and 03/23/2010. FINDINGS: Lower chest: Cardiomegaly and mild atelectasis at both lung bases, similar to recent CT. No significant pleural effusion. Hepatobiliary: The thin section MRCP images are motion degraded. No focal hepatic abnormalities are identified. As demonstrated on recent CT, there is intrahepatic and extrahepatic biliary dilatation. The common bile duct measures up to 1.5 cm in diameter. No intraductal calculi are demonstrated. There is mild gallbladder wall thickening and pericholecystic fluid without discrete gallstones. Pancreas: The pancreatic duct is normal in caliber. There is a cystic lesion within the pancreatic head, measuring 2.8 x 2.4 cm. This contains dependent intrinsic T1 shortening, but otherwise appears simple. This has only minimally enlarged from 2012 and is likely a benign finding. There is an adjacent periampullary duodenal diverticulum. No surrounding inflammatory changes. Spleen: Normal in size without focal abnormality. Adrenals/Urinary Tract: Both adrenal glands appear normal. Both kidneys demonstrate mild cortical thinning and perinephric soft tissue stranding. There are small left renal cysts. No hydronephrosis. Stomach/Bowel: No evidence of bowel wall thickening, distention or surrounding inflammatory change.Minimal ascites and mesenteric edema. Vascular/Lymphatic: There are no enlarged abdominal lymph nodes.  Aortoiliac atherosclerosis better demonstrated on CT. Duplication of the IVC. Other: None. Musculoskeletal: Multilevel spondylosis and scoliosis noted. No acute osseous findings. IMPRESSION: 1. Persistent biliary dilatation of undetermined etiology. No evidence of choledocholithiasis. Of note, this biliary dilatation appears chronic and not grossly changed from 2012. 2. Gallbladder wall thickening and pericholecystic fluid may reflect cholecystitis in the appropriate clinical context. 3. Cystic lesion in the pancreatic head demonstrates no aggressive characteristics and is only mildly enlarged from 2012, likely a benign finding. Electronically Signed   By: Richardean Sale M.D.   On: 02/05/2015 17:32   Dg Chest Port 1 View  02/06/2015  CLINICAL DATA:  Dyspnea and mid sternal chest pain. EXAM: PORTABLE CHEST 1 VIEW COMPARISON:  02/03/2015 FINDINGS: Lung volumes are extremely low bilaterally. Despite low volumes, a degree of interstitial edema is suspected. There may be small bilateral pleural effusions. The heart size is stable. IMPRESSION: Low lung volumes with suspicion of interstitial edema. There may be small bilateral pleural effusions. Electronically Signed   By: Aletta Edouard M.D.   On: 02/06/2015 09:00   Dg Chest Portable 1 View  02/03/2015  CLINICAL DATA:  Fever EXAM: PORTABLE CHEST 1 VIEW COMPARISON:  01/14/2015 FINDINGS: Heart size mildly enlarged. Mild vascular congestion. Negative for edema or effusion. Prominent lung markings are similar to  prior studies consistent with mild scarring. IMPRESSION: Mild congestive heart failure without significant edema or effusion. No definite pneumonia. Electronically Signed   By: Franchot Gallo M.D.   On: 02/03/2015 14:18   Dg Chest Port 1 View  01/14/2015  CLINICAL DATA:  CHF and shortness of breath EXAM: PORTABLE CHEST 1 VIEW COMPARISON:  Yesterday FINDINGS: Chronic cardiomegaly. Stable aortic and hilar contours. Re- demonstrated ovoid opacity along the  right minor fissure, likely trapped fluid. Persistent diffuse interstitial coarsening which has the appearance of pulmonary edema on prior chest CT. No effusion. No air leak. IMPRESSION: 1. Stable mild CHF. 2. Stable opacity at the minor fissure, likely fissural fluid based on recent CT. Electronically Signed   By: Monte Fantasia M.D.   On: 01/14/2015 06:39   Mr Lambert Mody Cm/mrcp  02/05/2015  CLINICAL DATA:  Cholangitis with suspected past common duct stone. Cystic pancreatic head lesion. EXAM: MRI ABDOMEN WITHOUT CONTRAST  (INCLUDING MRCP) TECHNIQUE: Multiplanar multisequence MR imaging of the abdomen was performed. Heavily T2-weighted images of the biliary and pancreatic ducts were obtained, and three-dimensional MRCP images were rendered by post processing. COMPARISON:  CT 02/03/2015 and 03/23/2010. FINDINGS: Lower chest: Cardiomegaly and mild atelectasis at both lung bases, similar to recent CT. No significant pleural effusion. Hepatobiliary: The thin section MRCP images are motion degraded. No focal hepatic abnormalities are identified. As demonstrated on recent CT, there is intrahepatic and extrahepatic biliary dilatation. The common bile duct measures up to 1.5 cm in diameter. No intraductal calculi are demonstrated. There is mild gallbladder wall thickening and pericholecystic fluid without discrete gallstones. Pancreas: The pancreatic duct is normal in caliber. There is a cystic lesion within the pancreatic head, measuring 2.8 x 2.4 cm. This contains dependent intrinsic T1 shortening, but otherwise appears simple. This has only minimally enlarged from 2012 and is likely a benign finding. There is an adjacent periampullary duodenal diverticulum. No surrounding inflammatory changes. Spleen: Normal in size without focal abnormality. Adrenals/Urinary Tract: Both adrenal glands appear normal. Both kidneys demonstrate mild cortical thinning and perinephric soft tissue stranding. There are small left renal cysts.  No hydronephrosis. Stomach/Bowel: No evidence of bowel wall thickening, distention or surrounding inflammatory change.Minimal ascites and mesenteric edema. Vascular/Lymphatic: There are no enlarged abdominal lymph nodes. Aortoiliac atherosclerosis better demonstrated on CT. Duplication of the IVC. Other: None. Musculoskeletal: Multilevel spondylosis and scoliosis noted. No acute osseous findings. IMPRESSION: 1. Persistent biliary dilatation of undetermined etiology. No evidence of choledocholithiasis. Of note, this biliary dilatation appears chronic and not grossly changed from 2012. 2. Gallbladder wall thickening and pericholecystic fluid may reflect cholecystitis in the appropriate clinical context. 3. Cystic lesion in the pancreatic head demonstrates no aggressive characteristics and is only mildly enlarged from 2012, likely a benign finding. Electronically Signed   By: Richardean Sale M.D.   On: 02/05/2015 17:32    Microbiology: Recent Results (from the past 240 hour(s))  Blood culture (routine x 2)     Status: None   Collection Time: 02/03/15  1:35 PM  Result Value Ref Range Status   Specimen Description BLOOD RIGHT ARM  Final   Special Requests BOTTLES DRAWN AEROBIC AND ANAEROBIC 5CC  Final   Culture  Setup Time   Final    GRAM NEGATIVE RODS IN BOTH AEROBIC AND ANAEROBIC BOTTLES CRITICAL RESULT CALLED TO, READ BACK BY AND VERIFIED WITH: B REAP @0202   02/04/15 MKELLY    Culture ENTEROBACTER CLOACAE  Final   Report Status 02/06/2015 FINAL  Final   Organism  ID, Bacteria ENTEROBACTER CLOACAE  Final      Susceptibility   Enterobacter cloacae - MIC*    CEFAZOLIN <=4 RESISTANT Resistant     CEFEPIME <=1 SENSITIVE Sensitive     CEFTAZIDIME <=1 SENSITIVE Sensitive     CEFTRIAXONE <=1 SENSITIVE Sensitive     CIPROFLOXACIN <=0.25 SENSITIVE Sensitive     GENTAMICIN <=1 SENSITIVE Sensitive     IMIPENEM <=0.25 SENSITIVE Sensitive     TRIMETH/SULFA <=20 SENSITIVE Sensitive     PIP/TAZO <=4  SENSITIVE Sensitive     * ENTEROBACTER CLOACAE  Blood culture (routine x 2)     Status: None   Collection Time: 02/03/15  2:10 PM  Result Value Ref Range Status   Specimen Description BLOOD LEFT HAND  Final   Special Requests BOTTLES DRAWN AEROBIC AND ANAEROBIC 5CC  Final   Culture  Setup Time   Final    GRAM NEGATIVE RODS AEROBIC BOTTLE ONLY CRITICAL RESULT CALLED TO, READ BACK BY AND VERIFIED WITH: A PETTIFORD @0552  02/05/15 MKELLY    Culture   Final    ENTEROBACTER CLOACAE SUSCEPTIBILITIES PERFORMED ON PREVIOUS CULTURE WITHIN THE LAST 5 DAYS.    Report Status 02/07/2015 FINAL  Final  MRSA PCR Screening     Status: None   Collection Time: 02/03/15 10:13 PM  Result Value Ref Range Status   MRSA by PCR NEGATIVE NEGATIVE Final    Comment:        The GeneXpert MRSA Assay (FDA approved for NASAL specimens only), is one component of a comprehensive MRSA colonization surveillance program. It is not intended to diagnose MRSA infection nor to guide or monitor treatment for MRSA infections.      Labs: Basic Metabolic Panel:  Recent Labs Lab 02/04/15 0428 02/05/15 0408 02/06/15 0614 02/07/15 0526 02/09/15 0544  NA 143 146* 143 143 142  K 3.1* 3.5 3.5 3.8 3.6  CL 106 112* 108 104 98*  CO2 26 25 25  32 33*  GLUCOSE 193* 112* 207* 162* 150*  BUN 12 10 9  5* 8  CREATININE 1.29* 1.02* 1.10* 1.11* 1.08*  CALCIUM 8.3* 8.4* 9.1 9.0 9.7   Liver Function Tests:  Recent Labs Lab 02/04/15 0428 02/05/15 0408 02/06/15 0614 02/07/15 0526 02/09/15 0544  AST 276* 90* 40 18 14*  ALT 189* 115* 88* 52 30  ALKPHOS 171* 144* 152* 125 118  BILITOT 3.2* 1.7* 1.3* 1.0 0.9  PROT 5.0* 5.6* 6.5 5.8* 6.4*  ALBUMIN 2.6* 2.5* 3.0* 2.6* 3.0*    Recent Labs Lab 02/03/15 1702  LIPASE 32   No results for input(s): AMMONIA in the last 168 hours. CBC:  Recent Labs Lab 02/03/15 1335 02/04/15 0428 02/05/15 0408 02/06/15 0920 02/07/15 0526 02/09/15 0544  WBC 8.3 13.8* 7.5 7.4 5.1  5.5  NEUTROABS 7.9*  --   --   --   --   --   HGB 13.3 10.7* 10.3* 12.0 11.3* 12.5  HCT 42.2 34.5* 33.5* 38.3 35.4* 40.5  MCV 90.8 90.1 91.3 89.3 89.4 91.4  PLT 199 160 126* 139* 135* 159   Cardiac Enzymes:  Recent Labs Lab 02/03/15 1335  TROPONINI <0.03   BNP: BNP (last 3 results)  Recent Labs  07/18/14 1216 01/13/15 0926 01/14/15 1040  BNP 250.6* 173.8* 45.0    ProBNP (last 3 results) No results for input(s): PROBNP in the last 8760 hours.  CBG:  Recent Labs Lab 02/08/15 1700 02/08/15 2009 02/09/15 0023 02/09/15 0407 02/09/15 0745  GLUCAP 189* 232* 178* 147*  167*       Signed:  Crystian Frith  [facility] 02/09/2015, 10:07 AM

## 2015-02-09 NOTE — Progress Notes (Signed)
Pt and daughter provided with discharge instructions including information on follow up appointments and new medications. Pt's daughter verbalized understanding of all information. IV was dc'd without complication. VSS. Pt escorted out via wheelchair by volunteer.

## 2015-02-09 NOTE — Care Management Note (Signed)
Case Management Note  Patient Details  Name: LOVELEE FORNER MRN: [id number] Date of Birth: 1929/05/18  Subjective/Objective:         CM following for progression and d/c planning.           Action/Plan: 02/08/2015 Met with pt and daughter re d/c need, they wish to resume services with [facility]. Services to restart in 48-72 hr. Per pt daughter this pt does not need any more DME.  Expected Discharge Date:       02/09/2015           Expected Discharge Plan:  [place]  In-House Referral:  NA  Discharge planning Services  CM Consult  Post Acute Care Choice:  Home Health Choice offered to:  Adult Children  DME Arranged:  N/A DME Agency:  NA  HH Arranged:  RN, PT HH Agency:  [address]  Status of Service:  Completed, signed off  Medicare Important Message Given:  Yes Date Medicare IM Given:    Medicare IM give by:    Date Additional Medicare IM Given:    Additional Medicare Important Message give by:     If discussed at [place] of Stay Meetings, dates discussed:    Additional Comments:  Adron Bene, RN 02/09/2015, 11:31 AM

## 2015-02-13 ENCOUNTER — Inpatient Hospital Stay (HOSPITAL_COMMUNITY)
Admission: EM | Admit: 2015-02-13 | Discharge: 2015-02-16 | DRG: 444 | Disposition: A | Discharge status: Home / Self Care | Payer: Medicare Other | Attending: Internal Medicine | Admitting: Internal Medicine

## 2015-02-13 ENCOUNTER — Encounter (HOSPITAL_COMMUNITY): Payer: Self-pay

## 2015-02-13 DIAGNOSIS — E785 Hyperlipidemia, unspecified: Secondary | ICD-10-CM | POA: Diagnosis present

## 2015-02-13 DIAGNOSIS — N179 Acute kidney failure, unspecified: Secondary | ICD-10-CM | POA: Diagnosis present

## 2015-02-13 DIAGNOSIS — H3530 Unspecified macular degeneration: Secondary | ICD-10-CM | POA: Diagnosis present

## 2015-02-13 DIAGNOSIS — N189 Chronic kidney disease, unspecified: Secondary | ICD-10-CM | POA: Diagnosis not present

## 2015-02-13 DIAGNOSIS — K859 Acute pancreatitis without necrosis or infection, unspecified: Secondary | ICD-10-CM | POA: Diagnosis present

## 2015-02-13 DIAGNOSIS — I480 Paroxysmal atrial fibrillation: Secondary | ICD-10-CM | POA: Diagnosis present

## 2015-02-13 DIAGNOSIS — I129 Hypertensive chronic kidney disease with stage 1 through stage 4 chronic kidney disease, or unspecified chronic kidney disease: Secondary | ICD-10-CM | POA: Diagnosis present

## 2015-02-13 DIAGNOSIS — E860 Dehydration: Secondary | ICD-10-CM | POA: Diagnosis present

## 2015-02-13 DIAGNOSIS — I5032 Chronic diastolic (congestive) heart failure: Secondary | ICD-10-CM | POA: Diagnosis present

## 2015-02-13 DIAGNOSIS — Z66 Do not resuscitate: Secondary | ICD-10-CM | POA: Diagnosis present

## 2015-02-13 DIAGNOSIS — E11621 Type 2 diabetes mellitus with foot ulcer: Secondary | ICD-10-CM | POA: Diagnosis present

## 2015-02-13 DIAGNOSIS — E1165 Type 2 diabetes mellitus with hyperglycemia: Secondary | ICD-10-CM | POA: Diagnosis present

## 2015-02-13 DIAGNOSIS — N183 Chronic kidney disease, stage 3 (moderate): Secondary | ICD-10-CM | POA: Diagnosis present

## 2015-02-13 DIAGNOSIS — K219 Gastro-esophageal reflux disease without esophagitis: Secondary | ICD-10-CM | POA: Diagnosis present

## 2015-02-13 DIAGNOSIS — L97419 Non-pressure chronic ulcer of right heel and midfoot with unspecified severity: Secondary | ICD-10-CM | POA: Diagnosis present

## 2015-02-13 DIAGNOSIS — R4781 Slurred speech: Secondary | ICD-10-CM | POA: Diagnosis present

## 2015-02-13 DIAGNOSIS — K8500 Idiopathic acute pancreatitis without necrosis or infection: Secondary | ICD-10-CM

## 2015-02-13 DIAGNOSIS — K819 Cholecystitis, unspecified: Secondary | ICD-10-CM | POA: Diagnosis present

## 2015-02-13 DIAGNOSIS — N3010 Interstitial cystitis (chronic) without hematuria: Secondary | ICD-10-CM | POA: Diagnosis present

## 2015-02-13 DIAGNOSIS — I69354 Hemiplegia and hemiparesis following cerebral infarction affecting left non-dominant side: Secondary | ICD-10-CM

## 2015-02-13 DIAGNOSIS — K8030 Calculus of bile duct with cholangitis, unspecified, without obstruction: Principal | ICD-10-CM | POA: Diagnosis present

## 2015-02-13 DIAGNOSIS — K862 Cyst of pancreas: Secondary | ICD-10-CM | POA: Diagnosis present

## 2015-02-13 DIAGNOSIS — E1122 Type 2 diabetes mellitus with diabetic chronic kidney disease: Secondary | ICD-10-CM | POA: Diagnosis present

## 2015-02-13 DIAGNOSIS — K8018 Calculus of gallbladder with other cholecystitis without obstruction: Secondary | ICD-10-CM | POA: Diagnosis present

## 2015-02-13 DIAGNOSIS — I69328 Other speech and language deficits following cerebral infarction: Secondary | ICD-10-CM | POA: Diagnosis not present

## 2015-02-13 DIAGNOSIS — I4892 Unspecified atrial flutter: Secondary | ICD-10-CM | POA: Diagnosis present

## 2015-02-13 DIAGNOSIS — Z885 Allergy status to narcotic agent status: Secondary | ICD-10-CM

## 2015-02-13 DIAGNOSIS — L97411 Non-pressure chronic ulcer of right heel and midfoot limited to breakdown of skin: Secondary | ICD-10-CM

## 2015-02-13 DIAGNOSIS — K830 Cholangitis: Secondary | ICD-10-CM | POA: Diagnosis not present

## 2015-02-13 DIAGNOSIS — R1011 Right upper quadrant pain: Secondary | ICD-10-CM

## 2015-02-13 LAB — URINE MICROSCOPIC-ADD ON

## 2015-02-13 LAB — COMPREHENSIVE METABOLIC PANEL
ALT: 28 U/L (ref 14–54)
AST: 85 U/L — ABNORMAL HIGH (ref 15–41)
Albumin: 3.5 g/dL (ref 3.5–5.0)
Alkaline Phosphatase: 226 U/L — ABNORMAL HIGH (ref 38–126)
Anion gap: 14 (ref 5–15)
BUN: 15 mg/dL (ref 6–20)
CO2: 25 mmol/L (ref 22–32)
Calcium: 9.7 mg/dL (ref 8.9–10.3)
Chloride: 102 mmol/L (ref 101–111)
Creatinine, Ser: 1.81 mg/dL — ABNORMAL HIGH (ref 0.44–1.00)
GFR calc Af Amer: 29 mL/min — ABNORMAL LOW (ref >60)
GFR calc non Af Amer: 25 mL/min — ABNORMAL LOW (ref >60)
Glucose, Bld: 212 mg/dL — ABNORMAL HIGH (ref 65–99)
Potassium: 4.1 mmol/L (ref 3.5–5.1)
Sodium: 141 mmol/L (ref 135–145)
Total Bilirubin: 1.1 mg/dL (ref 0.3–1.2)
Total Protein: 7.2 g/dL (ref 6.5–8.1)

## 2015-02-13 LAB — URINALYSIS, ROUTINE W REFLEX MICROSCOPIC
Bilirubin Urine: NEGATIVE
Glucose, UA: 100 mg/dL — AB
Hgb urine dipstick: NEGATIVE
Ketones, ur: NEGATIVE mg/dL
Leukocytes, UA: NEGATIVE
Nitrite: NEGATIVE
Protein, ur: 30 mg/dL — AB
Specific Gravity, Urine: 1.016 (ref 1.005–1.030)
pH: 5.5 (ref 5.0–8.0)

## 2015-02-13 LAB — CBC
HCT: 47.8 % — ABNORMAL HIGH (ref 36.0–46.0)
Hemoglobin: 14.7 g/dL (ref 12.0–15.0)
MCH: 28.5 pg (ref 26.0–34.0)
MCHC: 30.8 g/dL (ref 30.0–36.0)
MCV: 92.6 fL (ref 78.0–100.0)
Platelets: 389 10*3/uL (ref 150–400)
RBC: 5.16 MIL/uL — ABNORMAL HIGH (ref 3.87–5.11)
RDW: 13.3 % (ref 11.5–15.5)
WBC: 22 10*3/uL — ABNORMAL HIGH (ref 4.0–10.5)

## 2015-02-13 LAB — LACTIC ACID, PLASMA: Lactic Acid, Venous: 1.6 mmol/L (ref 0.5–2.0)

## 2015-02-13 LAB — LIPASE, BLOOD: Lipase: 1505 U/L — ABNORMAL HIGH (ref 11–51)

## 2015-02-13 MED ORDER — AMLODIPINE BESYLATE 5 MG PO TABS
5.0000 mg | ORAL_TABLET | Freq: Every day | ORAL | Status: DC
  Administered 2015-02-14 – 2015-02-16 (×3): 5 mg via ORAL
  Filled 2015-02-13 (×3): qty 1

## 2015-02-13 MED ORDER — CYANOCOBALAMIN 1000 MCG/ML IJ SOLN
1000.0000 ug | INTRAMUSCULAR | Status: DC
  Administered 2015-02-14: 1000 ug via INTRAMUSCULAR
  Filled 2015-02-13: qty 1

## 2015-02-13 MED ORDER — OXYBUTYNIN CHLORIDE 5 MG PO TABS
5.0000 mg | ORAL_TABLET | Freq: Two times a day (BID) | ORAL | Status: DC
  Administered 2015-02-13 – 2015-02-16 (×5): 5 mg via ORAL
  Filled 2015-02-13 (×6): qty 1

## 2015-02-13 MED ORDER — SODIUM CHLORIDE 0.9 % IV SOLN
INTRAVENOUS | Status: DC
  Administered 2015-02-13 – 2015-02-14 (×2): via INTRAVENOUS

## 2015-02-13 MED ORDER — LEVOFLOXACIN 750 MG PO TABS: 750.0000 mg | ORAL_TABLET | ORAL | Status: DC

## 2015-02-13 MED ORDER — ONDANSETRON HCL 4 MG/2ML IJ SOLN: 4.0000 mg | Freq: Four times a day (QID) | INTRAMUSCULAR | Status: DC | PRN

## 2015-02-13 MED ORDER — METOPROLOL TARTRATE 50 MG PO TABS
50.0000 mg | ORAL_TABLET | Freq: Two times a day (BID) | ORAL | Status: DC
  Administered 2015-02-13 – 2015-02-16 (×5): 50 mg via ORAL
  Filled 2015-02-13 (×6): qty 1

## 2015-02-13 MED ORDER — HEPARIN SODIUM (PORCINE) 5000 UNIT/ML IJ SOLN
5000.0000 [IU] | Freq: Three times a day (TID) | INTRAMUSCULAR | Status: DC
  Administered 2015-02-13 – 2015-02-16 (×8): 5000 [IU] via SUBCUTANEOUS
  Filled 2015-02-13 (×8): qty 1

## 2015-02-13 MED ORDER — INSULIN ASPART 100 UNIT/ML ~~LOC~~ SOLN
0.0000 [IU] | SUBCUTANEOUS | Status: DC
  Administered 2015-02-14 (×2): 1 [IU] via SUBCUTANEOUS
  Administered 2015-02-14: 3 [IU] via SUBCUTANEOUS
  Administered 2015-02-15: 1 [IU] via SUBCUTANEOUS
  Administered 2015-02-16: 3 [IU] via SUBCUTANEOUS

## 2015-02-13 MED ORDER — MIRTAZAPINE 30 MG PO TABS
30.0000 mg | ORAL_TABLET | Freq: Every day | ORAL | Status: DC
  Administered 2015-02-14 – 2015-02-15 (×2): 30 mg via ORAL
  Filled 2015-02-13 (×4): qty 1

## 2015-02-13 MED ORDER — ASPIRIN EC 325 MG PO TBEC
325.0000 mg | DELAYED_RELEASE_TABLET | Freq: Every day | ORAL | Status: DC
  Administered 2015-02-14 – 2015-02-16 (×3): 325 mg via ORAL
  Filled 2015-02-13 (×3): qty 1

## 2015-02-13 MED ORDER — ESTROGENS, CONJUGATED 0.625 MG/GM VA CREA
1.0000 | TOPICAL_CREAM | Freq: Every day | VAGINAL | Status: DC | PRN
  Filled 2015-02-13: qty 30

## 2015-02-13 MED ORDER — ACETAMINOPHEN 500 MG PO TABS: 500.0000 mg | ORAL_TABLET | Freq: Four times a day (QID) | ORAL | Status: DC | PRN

## 2015-02-13 MED ORDER — VILAZODONE HCL 20 MG PO TABS
20.0000 mg | ORAL_TABLET | Freq: Every day | ORAL | Status: DC
  Administered 2015-02-14 – 2015-02-16 (×3): 20 mg via ORAL
  Filled 2015-02-13 (×5): qty 1

## 2015-02-13 MED ORDER — VITAMIN D 1000 UNITS PO TABS
2000.0000 [IU] | ORAL_TABLET | Freq: Every day | ORAL | Status: DC
  Administered 2015-02-14 – 2015-02-16 (×3): 2000 [IU] via ORAL
  Filled 2015-02-13 (×3): qty 2

## 2015-02-13 MED ORDER — METRONIDAZOLE IN NACL 5-0.79 MG/ML-% IV SOLN
500.0000 mg | Freq: Once | INTRAVENOUS | Status: AC
  Administered 2015-02-13: 500 mg via INTRAVENOUS
  Filled 2015-02-13: qty 100

## 2015-02-13 MED ORDER — ONDANSETRON HCL 4 MG/2ML IJ SOLN
4.0000 mg | Freq: Once | INTRAMUSCULAR | Status: AC | PRN
  Administered 2015-02-13: 4 mg via INTRAVENOUS
  Filled 2015-02-13: qty 2

## 2015-02-13 MED ORDER — ZOLPIDEM TARTRATE 5 MG PO TABS
2.5000 mg | ORAL_TABLET | Freq: Every day | ORAL | Status: DC
  Administered 2015-02-13 – 2015-02-15 (×2): 2.5 mg via ORAL
  Filled 2015-02-13 (×3): qty 1

## 2015-02-13 MED ORDER — ONDANSETRON HCL 4 MG PO TABS: 4.0000 mg | ORAL_TABLET | Freq: Four times a day (QID) | ORAL | Status: DC | PRN

## 2015-02-13 MED ORDER — NITROGLYCERIN 0.4 MG SL SUBL: 0.4000 mg | SUBLINGUAL_TABLET | SUBLINGUAL | Status: DC | PRN

## 2015-02-13 MED ORDER — PANTOPRAZOLE SODIUM 40 MG PO TBEC
40.0000 mg | DELAYED_RELEASE_TABLET | Freq: Every day | ORAL | Status: DC
  Administered 2015-02-14 – 2015-02-16 (×3): 40 mg via ORAL
  Filled 2015-02-13 (×4): qty 1

## 2015-02-13 MED ORDER — PREGABALIN 75 MG PO CAPS
75.0000 mg | ORAL_CAPSULE | Freq: Two times a day (BID) | ORAL | Status: DC
  Administered 2015-02-13 – 2015-02-16 (×5): 75 mg via ORAL
  Filled 2015-02-13 (×6): qty 1

## 2015-02-13 MED ORDER — SODIUM CHLORIDE 0.9 % IV SOLN
INTRAVENOUS | Status: DC
  Administered 2015-02-13: 20:00:00 via INTRAVENOUS

## 2015-02-13 MED ORDER — FENTANYL CITRATE (PF) 100 MCG/2ML IJ SOLN
50.0000 ug | INTRAMUSCULAR | Status: DC | PRN
  Administered 2015-02-13 (×2): 50 ug via INTRAVENOUS
  Filled 2015-02-13 (×2): qty 2

## 2015-02-13 MED ORDER — LITHIUM CARBONATE 150 MG PO CAPS
150.0000 mg | ORAL_CAPSULE | Freq: Every day | ORAL | Status: DC
  Administered 2015-02-14 – 2015-02-16 (×3): 150 mg via ORAL
  Filled 2015-02-13 (×4): qty 1

## 2015-02-13 MED ORDER — ATORVASTATIN CALCIUM 10 MG PO TABS: 5.0000 mg | ORAL_TABLET | Freq: Every day | ORAL | Status: DC

## 2015-02-13 NOTE — H&P (Signed)
[facility] History and Physical  [name] [id number] DOB: [birth year] DOA: 02/13/2015  Referring physician: ED PCP: Irven Shelling, MD   Chief Complaint: Abdominal pain with nausea and vomiting.  HPI:  [name] is a 81 year old female with a past medical history significant for Htn, Hld, DM2, CKD stage III, TIA/stroke w/residual left-sided weakness and slurred speech; who presents with epigastric pain, nausea, and vomiting. Patient was recently admitted at [facility] from 12/1-12/7. She was admitted with signs and symptoms of cholangitis/possible cholecystitis seen by gastroenterology and surgery at that time who questioned need of ERCP and/or cholecystectomy. Patient ppeared to improve and it was decided to discharge her home with antibiotics of Levaquin and a follow-up with GI for ERCP in 2 weeks. However, patient notes that she didn't feel well when she was initially discharged. States that she could only eat a few bites of food at that time however symptoms progressively worsened. She states that the epigastric pain never really went away either. Pain was still located in the epigastric region, but  worsened to the point that she said she could not eat. Anything that she ate caused her to have nausea and vomiting. Reports taking antibiotics  as prescribed at her discharge..   Review of Systems  Constitutional: Positive for malaise/fatigue. Negative for chills.       Decreased appetite  HENT: Positive for hearing loss. Negative for ear pain.   Eyes: Negative for pain and discharge.  Respiratory: Negative for hemoptysis and sputum production.   Cardiovascular: Negative for palpitations and orthopnea.  Gastrointestinal: Positive for nausea, vomiting, abdominal pain and diarrhea. Negative for constipation.  Genitourinary: Negative for urgency, frequency and hematuria.  Musculoskeletal: Positive for joint pain. Negative for falls.  Skin: Negative for itching and rash.   Neurological: Positive for tremors, speech change (from previous stroke) and focal weakness (from previous stroke on the left side).  Endo/Heme/Allergies: Negative for environmental allergies and polydipsia.  Psychiatric/Behavioral: Negative for suicidal ideas and substance abuse.     Past Medical History  Diagnosis Date  . Diabetes mellitus without complication ([place])   . Hypertension   . CKD (chronic kidney disease), stage III   . PVC (premature ventricular contraction)   . Hyperlipidemia   . GERD (gastroesophageal reflux disease)   . Osteopenia   . Chronic low back pain   . Postlaminectomy syndrome   . Right carpal tunnel syndrome   . Pernicious anemia   . Chronic interstitial cystitis   . Microhematuria   . Macular degeneration   . Chest pain     a. 07/2014 Neg MV, nl EF.     History reviewed. No pertinent past surgical history.    Social History:  reports that she has never smoked. She has never used smokeless tobacco. She reports that she does not drink alcohol. Her drug history is not on file. Where does patient live--home   and with whom if at home? Husband Can patient participate in ADLs? Yes, ambulates with a walker  Allergies  Allergen Reactions  . Codeine Other (See Comments)    slurred speech    Family History  Problem Relation Age of Onset  . Arthritis Mother   . Heart attack Father        Prior to Admission medications   Medication Sig Start Date End Date Taking? Authorizing Provider  acetaminophen (TYLENOL) 500 MG tablet Take 500 mg by mouth every 6 (six) hours as needed for mild pain.   Yes Historical Provider,  MD  amLODipine (NORVASC) 5 MG tablet Take 1 tablet (5 mg total) by mouth daily. 11/21/14  Yes Ripudeep Krystal Eaton, MD  aspirin EC 325 MG EC tablet Take 1 tablet (325 mg total) by mouth daily. 11/21/14  Yes Ripudeep Krystal Eaton, MD  atorvastatin (LIPITOR) 10 MG tablet Take 5 mg by mouth daily.    Yes Historical Provider, MD  cholecalciferol (VITAMIN D)  1000 UNITS tablet Take 2,000 Units by mouth daily.    Yes Historical Provider, MD  conjugated estrogens (PREMARIN) vaginal cream Place 1 Applicatorful vaginally daily as needed (burning).    Yes Historical Provider, MD  cyanocobalamin (,VITAMIN B-12,) 1000 MCG/ML injection Inject 1,000 mcg into the muscle every 30 (thirty) days.   Yes Historical Provider, MD  furosemide (LASIX) 20 MG tablet Take 2 tablets (40 mg total) by mouth daily. 02/08/15  Yes Domenic Polite, MD  glimepiride (AMARYL) 1 MG tablet Take 2 tablets (2 mg total) by mouth every morning. Patient taking differently: Take 2 mg by mouth daily with breakfast.  01/17/15  Yes Charlynne Cousins, MD  levofloxacin (LEVAQUIN) 750 MG tablet Take 1 tablet (750 mg total) by mouth every other day. For 9 days Patient taking differently: Take 750 mg by mouth every other day. 9 day course started 02/10/15 02/08/15  Yes Domenic Polite, MD  lisinopril (PRINIVIL,ZESTRIL) 20 MG tablet Take 1 tablet (20 mg total) by mouth every evening. Patient taking differently: Take 20 mg by mouth at bedtime.  07/22/14  Yes Maryann Mikhail, DO  lithium carbonate 150 MG capsule Take 150 mg by mouth daily.   Yes Historical Provider, MD  metoprolol (LOPRESSOR) 50 MG tablet Take 1 tablet (50 mg total) by mouth 2 (two) times daily. 07/22/14  Yes Maryann Mikhail, DO  mirtazapine (REMERON) 30 MG tablet Take 30 mg by mouth at bedtime.    Yes Historical Provider, MD  nitroGLYCERIN (NITROSTAT) 0.4 MG SL tablet Place 1 tablet (0.4 mg total) under the tongue every 5 (five) minutes as needed for chest pain. 07/22/14  Yes Maryann Mikhail, DO  omeprazole (PRILOSEC) 40 MG capsule Take 40 mg by mouth at bedtime.    Yes Historical Provider, MD  oxybutynin (DITROPAN) 5 MG tablet Take 5 mg by mouth 2 (two) times daily.    Yes Historical Provider, MD  pregabalin (LYRICA) 75 MG capsule Take 75 mg by mouth 2 (two) times daily.   Yes Historical Provider, MD  traMADol (ULTRAM) 50 MG tablet Take 1  tablet (50 mg total) by mouth 2 (two) times daily as needed (pain). Patient taking differently: Take 50 mg by mouth 2 (two) times daily.  07/22/14  Yes Maryann Mikhail, DO  Vilazodone HCl 20 MG TABS Take 20 mg by mouth daily. Viibryd   Yes Historical Provider, MD  zolpidem (AMBIEN) 5 MG tablet Take 2.5 mg by mouth at bedtime.    Yes Historical Provider, MD     Physical Exam: Filed Vitals:   02/13/15 2100 02/13/15 2200 02/13/15 2230 02/13/15 2331  BP: 179/55 138/48 149/64 164/47  Pulse: 74 76 83 85  Temp:    97.7 F (36.5 C)  TempSrc:    Oral  Resp: 20 13 23 21   Height:    5' 4.5" (1.638 m)  Weight:    76.749 kg (169 lb 3.2 oz)  SpO2: 99% 100% 100% 100%     Constitutional: Vital signs reviewed. Patient is a elderly female with a sickly appearance nontoxic able to answer questions and cooperate with exam.  Head: Normocephalic and atraumatic  Ear: TM normal bilaterally  Mouth: no erythema or exudates, dry mucous membranes Eyes: PERRL, EOMI, conjunctivae normal, No scleral icterus.  Neck: Supple, Trachea midline normal ROM, No JVD, mass, thyromegaly, or carotid bruit present.  Cardiovascular: RRR, S1 normal, S2 normal, no MRG, pulses symmetric and intact bilaterally  Pulmonary/Chest: Mild expiratory wheezes and crackles at the bases bilaterally Abdominal: Soft. Nondistended with tenderness to palpation along the epigastric region, and positive bowel sounds in all 4 quadrants. No guarding. GU: no CVA tenderness Musculoskeletal: No joint deformities, erythema, or stiffness, ROM full and no nontender Ext: Trace edema and no cyanosis, pulses palpable bilaterally (DP and PT)  Hematology: no cervical, inginal, or axillary adenopathy.  Neurological: A&O x3, decreased overall strength on the left upper and lower 4 out of 5 when compared to the right side where strength is a 5 out of 5. Patient has a baseline tremor at rest. Skin: Warm, dry and intact. Right heel appears to be healed over with  mild redness present. No rash, cyanosis, or clubbing.  Psychiatric: Normal mood and affect. speech and behavior is normal. Judgment and thought content normal. Cognition and memory are normal.      Data Review   Micro Results No results found for this or any previous visit (from the past 240 hour(s)).  Radiology Reports Ct Abdomen Pelvis W Contrast  02/03/2015  CLINICAL DATA:  Fever, abdominal pain, abnormal LFTs EXAM: CT ABDOMEN AND PELVIS WITH CONTRAST TECHNIQUE: Multidetector CT imaging of the abdomen and pelvis was performed using the standard protocol following bolus administration of intravenous contrast. CONTRAST:  25mL OMNIPAQUE IOHEXOL 300 MG/ML  SOLN COMPARISON:  03/23/2010 FINDINGS: Lower chest:  Mild bibasilar chronic interstitial disease. Hepatobiliary: No focal hepatic mass. Intrahepatic biliary ductal dilatation. Dilated common bile duct to the level of the pancreatic head. Mild pericholecystic fluid or wall thickening. Pancreas: 2.8 X 2.4 cm cystic pancreatic head mass with a punctate mural calcification. Spleen: Normal. Adrenals/Urinary Tract: Normal adrenal glands. 10 mm hypodense, fluid attenuating left renal mass most consistent with a cyst. Normal right kidney. No obstructive uropathy or urolithiasis. Unremarkable bladder. Stomach/Bowel: Duo dual diverticulum is noted. No bowel wall thickening or dilatation. Diverticulosis of the descending and sigmoid colon without evidence of diverticulitis. Rectal fecal impaction. No pneumatosis, pneumoperitoneum or portal venous gas. No abdominal or pelvic free fluid. Small fat containing umbilical hernia. Vascular/Lymphatic: Normal caliber abdominal aorta with atherosclerosis. No abdominal or pelvic lymphadenopathy. Reproductive: Normal uterus.  No adnexal mass. Other: No fluid collection or hematoma. Musculoskeletal: No acute osseous abnormality. No aggressive lytic or sclerotic osseous lesion. Left hip arthroplasty. Degenerative disc disease  throughout the lumbar spine with diffuse facet arthropathy. IMPRESSION: 1. No acute abdominal or pelvic pathology. 2. 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 3. Intrahepatic and extrahepatic biliary ductal dilatation with abrupt narrowing at the pancreatic head. No focal ampullary mass or choledocholithiasis is identified. 4. Mild pericholecystic fluid or wall thickening. This appearance can be seen with cholecystitis. If there is further clinical concern, recommend a right upper quadrant ultrasound. Electronically Signed   By: Kathreen Devoid   On: 02/03/2015 17:03   Mr 3d Recon At Scanner  02/05/2015  CLINICAL DATA:  Cholangitis with suspected past common duct stone. Cystic pancreatic head lesion. EXAM: MRI ABDOMEN WITHOUT CONTRAST  (INCLUDING MRCP) TECHNIQUE: Multiplanar multisequence MR imaging of the abdomen was  performed. Heavily T2-weighted images of the biliary and pancreatic ducts were obtained, and three-dimensional MRCP images were rendered by post processing. COMPARISON:  CT 02/03/2015 and 03/23/2010. FINDINGS: Lower chest: Cardiomegaly and mild atelectasis at both lung bases, similar to recent CT. No significant pleural effusion. Hepatobiliary: The thin section MRCP images are motion degraded. No focal hepatic abnormalities are identified. As demonstrated on recent CT, there is intrahepatic and extrahepatic biliary dilatation. The common bile duct measures up to 1.5 cm in diameter. No intraductal calculi are demonstrated. There is mild gallbladder wall thickening and pericholecystic fluid without discrete gallstones. Pancreas: The pancreatic duct is normal in caliber. There is a cystic lesion within the pancreatic head, measuring 2.8 x 2.4 cm. This contains dependent intrinsic T1 shortening, but otherwise appears simple. This has only minimally enlarged from 2012 and is likely a benign finding. There  is an adjacent periampullary duodenal diverticulum. No surrounding inflammatory changes. Spleen: Normal in size without focal abnormality. Adrenals/Urinary Tract: Both adrenal glands appear normal. Both kidneys demonstrate mild cortical thinning and perinephric soft tissue stranding. There are small left renal cysts. No hydronephrosis. Stomach/Bowel: No evidence of bowel wall thickening, distention or surrounding inflammatory change.Minimal ascites and mesenteric edema. Vascular/Lymphatic: There are no enlarged abdominal lymph nodes. Aortoiliac atherosclerosis better demonstrated on CT. Duplication of the IVC. Other: None. Musculoskeletal: Multilevel spondylosis and scoliosis noted. No acute osseous findings. IMPRESSION: 1. Persistent biliary dilatation of undetermined etiology. No evidence of choledocholithiasis. Of note, this biliary dilatation appears chronic and not grossly changed from 2012. 2. Gallbladder wall thickening and pericholecystic fluid may reflect cholecystitis in the appropriate clinical context. 3. Cystic lesion in the pancreatic head demonstrates no aggressive characteristics and is only mildly enlarged from 2012, likely a benign finding. Electronically Signed   By: Richardean Sale M.D.   On: 02/05/2015 17:32   Dg Chest Port 1 View  02/06/2015  CLINICAL DATA:  Dyspnea and mid sternal chest pain. EXAM: PORTABLE CHEST 1 VIEW COMPARISON:  02/03/2015 FINDINGS: Lung volumes are extremely low bilaterally. Despite low volumes, a degree of interstitial edema is suspected. There may be small bilateral pleural effusions. The heart size is stable. IMPRESSION: Low lung volumes with suspicion of interstitial edema. There may be small bilateral pleural effusions. Electronically Signed   By: Aletta Edouard M.D.   On: 02/06/2015 09:00   Dg Chest Portable 1 View  02/03/2015  CLINICAL DATA:  Fever EXAM: PORTABLE CHEST 1 VIEW COMPARISON:  01/14/2015 FINDINGS: Heart size mildly enlarged. Mild vascular  congestion. Negative for edema or effusion. Prominent lung markings are similar to prior studies consistent with mild scarring. IMPRESSION: Mild congestive heart failure without significant edema or effusion. No definite pneumonia. Electronically Signed   By: Franchot Gallo M.D.   On: 02/03/2015 14:18   Mr Lambert Mody Cm/mrcp  02/05/2015  CLINICAL DATA:  Cholangitis with suspected past common duct stone. Cystic pancreatic head lesion. EXAM: MRI ABDOMEN WITHOUT CONTRAST  (INCLUDING MRCP) TECHNIQUE: Multiplanar multisequence MR imaging of the abdomen was performed. Heavily T2-weighted images of the biliary and pancreatic ducts were obtained, and three-dimensional MRCP images were rendered by post processing. COMPARISON:  CT 02/03/2015 and 03/23/2010. FINDINGS: Lower chest: Cardiomegaly and mild atelectasis at both lung bases, similar to recent CT. No significant pleural effusion. Hepatobiliary: The thin section MRCP images are motion degraded. No focal hepatic abnormalities are identified. As demonstrated on recent CT, there is intrahepatic and extrahepatic biliary dilatation. The common bile duct measures up to 1.5 cm in diameter. No intraductal  calculi are demonstrated. There is mild gallbladder wall thickening and pericholecystic fluid without discrete gallstones. Pancreas: The pancreatic duct is normal in caliber. There is a cystic lesion within the pancreatic head, measuring 2.8 x 2.4 cm. This contains dependent intrinsic T1 shortening, but otherwise appears simple. This has only minimally enlarged from 2012 and is likely a benign finding. There is an adjacent periampullary duodenal diverticulum. No surrounding inflammatory changes. Spleen: Normal in size without focal abnormality. Adrenals/Urinary Tract: Both adrenal glands appear normal. Both kidneys demonstrate mild cortical thinning and perinephric soft tissue stranding. There are small left renal cysts. No hydronephrosis. Stomach/Bowel: No evidence of bowel  wall thickening, distention or surrounding inflammatory change.Minimal ascites and mesenteric edema. Vascular/Lymphatic: There are no enlarged abdominal lymph nodes. Aortoiliac atherosclerosis better demonstrated on CT. Duplication of the IVC. Other: None. Musculoskeletal: Multilevel spondylosis and scoliosis noted. No acute osseous findings. IMPRESSION: 1. Persistent biliary dilatation of undetermined etiology. No evidence of choledocholithiasis. Of note, this biliary dilatation appears chronic and not grossly changed from 2012. 2. Gallbladder wall thickening and pericholecystic fluid may reflect cholecystitis in the appropriate clinical context. 3. Cystic lesion in the pancreatic head demonstrates no aggressive characteristics and is only mildly enlarged from 2012, likely a benign finding. Electronically Signed   By: Richardean Sale M.D.   On: 02/05/2015 17:32     CBC  Recent Labs Lab 02/07/15 0526 02/09/15 0544 02/13/15 1857  WBC 5.1 5.5 22.0*  HGB 11.3* 12.5 14.7  HCT 35.4* 40.5 47.8*  PLT 135* 159 389  MCV 89.4 91.4 92.6  MCH 28.5 28.2 28.5  MCHC 31.9 30.9 30.8  RDW 12.9 12.7 13.3    Chemistries   Recent Labs Lab 02/07/15 0526 02/09/15 0544 02/13/15 1857  NA 143 142 141  K 3.8 3.6 4.1  CL 104 98* 102  CO2 32 33* 25  GLUCOSE 162* 150* 212*  BUN 5* 8 15  CREATININE 1.11* 1.08* 1.81*  CALCIUM 9.0 9.7 9.7  AST 18 14* 85*  ALT 52 30 28  ALKPHOS 125 118 226*  BILITOT 1.0 0.9 1.1   ------------------------------------------------------------------------------------------------------------------ estimated creatinine clearance is 23.9 mL/min (by C-G formula based on Cr of 1.81). ------------------------------------------------------------------------------------------------------------------ No results for input(s): HGBA1C in the last 72 hours. ------------------------------------------------------------------------------------------------------------------ No results for  input(s): CHOL, HDL, LDLCALC, TRIG, CHOLHDL, LDLDIRECT in the last 72 hours. ------------------------------------------------------------------------------------------------------------------ No results for input(s): TSH, T4TOTAL, T3FREE, THYROIDAB in the last 72 hours.  Invalid input(s): FREET3 ------------------------------------------------------------------------------------------------------------------ No results for input(s): VITAMINB12, FOLATE, FERRITIN, TIBC, IRON, RETICCTPCT in the last 72 hours.  Coagulation profile No results for input(s): INR, PROTIME in the last 168 hours.  No results for input(s): DDIMER in the last 72 hours.  Cardiac Enzymes No results for input(s): CKMB, TROPONINI, MYOGLOBIN in the last 168 hours.  Invalid input(s): CK ------------------------------------------------------------------------------------------------------------------ Invalid input(s): POCBNP   CBG:  Recent Labs Lab 02/08/15 1700 02/08/15 2009 02/09/15 0023 02/09/15 0407 02/09/15 0745  GLUCAP 189* 232* 178* 147* 167*       EKG: Independently reviewed. Normal sinus rhythm with signs of LVH   Assessment/Plan Principal Problem:    Pancreatitis, acute: Patient was continued abdominal pain epigastric in nature. The exact cause unknown at this time.  Elevated  lipase1505, AST 85, and alkaline phosphatase 226. - admit to tele - abdominal ultrasound - incentive spirometry - Duonebs q2hr prn shortness of breath - Held possible precipitators including Lasix  Cholangitis -Broadened coverage to include metronidazole -Changed dosing frequency of Levaquin to chest for acute kidney injury  Acute kidney injury superimposed on chronic kidney disease (HCC):Cr function was noted to be 1.08 on discharge on 12/7. Patient presents Cr elevated at 1.81 on admission at 15. Suspect prerenal with patient's history of decreased po intake and continued diuretic use. - Check urine studies of   FeUr due to patient being on diuretics - IVF  - F/u BMP - avoiding nephrotoxic agents  Essential hypertension: -Continue home metoprolol and amlodipine -Held lisinopril and Lasix due to patient's acute kidney injury   -prn hydralazine for elevated blood pressure  Type 2 diabetes mellitus with hyperglycemia: Blood glucose initially 212 - Held oral home medication of glimperide - CBG every 4 hours for now. Change to every before meals unable - Sensitive sliding scale insulin   Hyperlipidemia Continue home atorvastatin    Ulcer of right heel: Resolving. Physical exam shows a healed slightly reddened area. - Keep right heel elevated off the bed   Cholangitis   Atrial flutter, paroxysmal (HCC)    Code Status:   full Family Communication: bedside Disposition Plan: admit   Total time spent 55 minutes.Greater than 50% of this time was spent in counseling, explanation of diagnosis, planning of further management, and coordination of care  [facility] Pager [phone]  If 7PM-7AM, please contact night-coverage [email or web address] Password [facility] 02/13/2015, 11:41 PM

## 2015-02-13 NOTE — ED Notes (Signed)
Pt d/c from hospital  02-09-15 for upper mid abd pain, vomiting.  Since discharge pain has slightly improved, vomiting restarted today x 2 and dry heaves.  Pt on Levofloxacin.

## 2015-02-13 NOTE — ED Provider Notes (Signed)
CSN: [id number]     Arrival date & time 02/13/15  1820 History   First MD Initiated Contact with Patient 02/13/15 1840     Chief Complaint  Patient presents with  . Vomiting  . Abdominal Pain     (Consider location/radiation/quality/duration/timing/severity/associated sxs/prior Treatment) HPI   85yF with abdominal pain and n/v. Pt known to me from recent admission. Discharged a couple days ago after initially presenting with symptoms concerning for cholangitis either from passed gallstone or extrinsic compression from known mass at head of pancreas. Seen by both surgery and GI. Radiographic signs of cholecystitis, but surgery felt clinically less likely. Sounds like debating ERCP and possible cholecystectomy but symptoms improved. Blood cultures were positive for Enterobacter Cloacae. She was discharged on Levaquin based on sensitivities which she is still currently taking. Daughter reports continued symptoms since discharge, but worse within the past day. Pt is not greatest historian but does endorse pain in epigastrium. Nausea and vomited twice today. No reported fever.    Past Medical History  Diagnosis Date  . Diabetes mellitus without complication ([place])   . Hypertension   . CKD (chronic kidney disease), stage III   . PVC (premature ventricular contraction)   . Hyperlipidemia   . GERD (gastroesophageal reflux disease)   . Osteopenia   . Chronic low back pain   . Postlaminectomy syndrome   . Right carpal tunnel syndrome   . Pernicious anemia   . Chronic interstitial cystitis   . Microhematuria   . Macular degeneration   . Chest pain     a. 07/2014 Neg MV, nl EF.   History reviewed. No pertinent past surgical history. Family History  Problem Relation Age of Onset  . Arthritis Mother   . Heart attack Father    Social History  Substance Use Topics  . Smoking status: Never Smoker   . Smokeless tobacco: Never Used  . Alcohol Use: No   OB History    No data available      Review of Systems  All systems reviewed and negative, other than as noted in HPI.   Allergies  Codeine  Home Medications   Prior to Admission medications   Medication Sig Start Date End Date Taking? Authorizing Provider  acetaminophen (TYLENOL) 500 MG tablet Take 500 mg by mouth every 6 (six) hours as needed for mild pain.    Historical Provider, MD  amLODipine (NORVASC) 5 MG tablet Take 1 tablet (5 mg total) by mouth daily. 11/21/14   Ripudeep Krystal Eaton, MD  aspirin EC 325 MG EC tablet Take 1 tablet (325 mg total) by mouth daily. 11/21/14   Ripudeep Krystal Eaton, MD  atorvastatin (LIPITOR) 10 MG tablet Take 5 mg by mouth every morning.     Historical Provider, MD  cholecalciferol (VITAMIN D) 1000 UNITS tablet Take 2,000 Units by mouth every morning.    Historical Provider, MD  conjugated estrogens (PREMARIN) vaginal cream Place 1 Applicatorful vaginally as needed (burning).    Historical Provider, MD  Cyanocobalamin (B-12 COMPLIANCE INJECTION IJ) Inject as directed every 30 (thirty) days.    Historical Provider, MD  furosemide (LASIX) 20 MG tablet Take 2 tablets (40 mg total) by mouth daily. 02/08/15   Domenic Polite, MD  glimepiride (AMARYL) 1 MG tablet Take 2 tablets (2 mg total) by mouth every morning. 01/17/15   Charlynne Cousins, MD  levofloxacin (LEVAQUIN) 750 MG tablet Take 1 tablet (750 mg total) by mouth every other day. For 9 days 02/08/15  Domenic Polite, MD  lisinopril (PRINIVIL,ZESTRIL) 20 MG tablet Take 1 tablet (20 mg total) by mouth every evening. 07/22/14   Maryann Mikhail, DO  lithium carbonate (LITHOBID) 300 MG CR tablet Take 150 mg by mouth every morning.    Historical Provider, MD  metoprolol (LOPRESSOR) 50 MG tablet Take 1 tablet (50 mg total) by mouth 2 (two) times daily. 07/22/14   Maryann Mikhail, DO  mirtazapine (REMERON) 30 MG tablet Take 30 mg by mouth every evening.    Historical Provider, MD  nitroGLYCERIN (NITROSTAT) 0.4 MG SL tablet Place 1 tablet (0.4 mg total) under  the tongue every 5 (five) minutes as needed for chest pain. 07/22/14   Maryann Mikhail, DO  omeprazole (PRILOSEC) 40 MG capsule Take 40 mg by mouth every evening.     Historical Provider, MD  oxybutynin (DITROPAN) 5 MG tablet Take 5 mg by mouth 2 (two) times daily.     Historical Provider, MD  pregabalin (LYRICA) 75 MG capsule Take 75 mg by mouth 2 (two) times daily.    Historical Provider, MD  traMADol (ULTRAM) 50 MG tablet Take 1 tablet (50 mg total) by mouth 2 (two) times daily as needed (pain). Patient taking differently: Take 50 mg by mouth every 12 (twelve) hours as needed for moderate pain.  07/22/14   Maryann Mikhail, DO  Vilazodone HCl 20 MG TABS Take 20 mg by mouth every morning.    Historical Provider, MD  zolpidem (AMBIEN) 5 MG tablet Take 2.5 mg by mouth at bedtime.     Historical Provider, MD   BP 168/54 mmHg  Pulse 72  Temp(Src) 98.8 F (37.1 C) (Rectal)  Resp 18  SpO2 96% Physical Exam  Constitutional: She appears well-developed and well-nourished. No distress.  HENT:  Head: Normocephalic and atraumatic.  Eyes: Conjunctivae are normal. Right eye exhibits no discharge. Left eye exhibits no discharge.  Neck: Neck supple.  Cardiovascular: Normal rate, regular rhythm and normal heart sounds.  Exam reveals no gallop and no friction rub.   No murmur heard. Pulmonary/Chest: Effort normal and breath sounds normal. No respiratory distress.  Abdominal: Soft. She exhibits no distension. There is tenderness.  Epigastric tenderness  Musculoskeletal: She exhibits no edema or tenderness.  Neurological: She is alert.  Skin: Skin is warm and dry.  Nursing note and vitals reviewed.   ED Course  Procedures (including critical care time) Labs Review Labs Reviewed  LIPASE, BLOOD - Abnormal; Notable for the following:    Lipase 1505 (*)    All other components within normal limits  COMPREHENSIVE METABOLIC PANEL - Abnormal; Notable for the following:    Glucose, Bld 212 (*)     Creatinine, Ser 1.81 (*)    AST 85 (*)    Alkaline Phosphatase 226 (*)    GFR calc non Af Amer 25 (*)    GFR calc Af Amer 29 (*)    All other components within normal limits  CBC - Abnormal; Notable for the following:    WBC 22.0 (*)    RBC 5.16 (*)    HCT 47.8 (*)    All other components within normal limits  URINALYSIS, ROUTINE W REFLEX MICROSCOPIC (NOT AT [facility]) - Abnormal; Notable for the following:    APPearance CLOUDY (*)    Glucose, UA 100 (*)    Protein, ur 30 (*)    All other components within normal limits  URINE MICROSCOPIC-ADD ON - Abnormal; Notable for the following:    Squamous Epithelial / LPF 0-5 (*)  Bacteria, UA RARE (*)    Casts GRANULAR CAST (*)    All other components within normal limits  CULTURE, BLOOD (ROUTINE X 2)  CULTURE, BLOOD (ROUTINE X 2)  LACTIC ACID, PLASMA  LACTIC ACID, PLASMA  COMPREHENSIVE METABOLIC PANEL  CBC    Imaging Review No results found. I have personally reviewed and evaluated these images and lab results as part of my medical decision-making.   EKG Interpretation None      MDM   Final diagnoses:  Acute pancreatitis, unspecified pancreatitis type    Today lipase is significant elevated and leukocytosis of 22,000. AP 226 and mild elevation in AST. Acute on chronic renal impairment.   IVF. Pain/nausea meds. She is on levaquin. She is afebrile and no hypotension but with increased WBC flagyl was also added. Discussed with Dr Oletta Lamas, on call for [facility].     Virgel Manifold, MD 02/14/15 [phone]

## 2015-02-14 ENCOUNTER — Inpatient Hospital Stay (HOSPITAL_COMMUNITY): Payer: Medicare Other

## 2015-02-14 LAB — CBC
HCT: 34.9 % — ABNORMAL LOW (ref 36.0–46.0)
HEMOGLOBIN: 10.7 g/dL — AB (ref 12.0–15.0)
MCH: 27.8 pg (ref 26.0–34.0)
MCHC: 30.7 g/dL (ref 30.0–36.0)
MCV: 90.6 fL (ref 78.0–100.0)
Platelets: 227 10*3/uL (ref 150–400)
RBC: 3.85 MIL/uL — AB (ref 3.87–5.11)
RDW: 13.5 % (ref 11.5–15.5)
WBC: 11.8 10*3/uL — ABNORMAL HIGH (ref 4.0–10.5)

## 2015-02-14 LAB — COMPREHENSIVE METABOLIC PANEL
ALBUMIN: 2.6 g/dL — AB (ref 3.5–5.0)
ALT: 20 U/L (ref 14–54)
ANION GAP: 10 (ref 5–15)
AST: 36 U/L (ref 15–41)
Alkaline Phosphatase: 162 U/L — ABNORMAL HIGH (ref 38–126)
BILIRUBIN TOTAL: 1.1 mg/dL (ref 0.3–1.2)
BUN: 18 mg/dL (ref 6–20)
CHLORIDE: 106 mmol/L (ref 101–111)
CO2: 25 mmol/L (ref 22–32)
Calcium: 8.4 mg/dL — ABNORMAL LOW (ref 8.9–10.3)
Creatinine, Ser: 1.44 mg/dL — ABNORMAL HIGH (ref 0.44–1.00)
GFR calc Af Amer: 38 mL/min — ABNORMAL LOW (ref >60)
GFR calc non Af Amer: 33 mL/min — ABNORMAL LOW (ref >60)
GLUCOSE: 108 mg/dL — AB (ref 65–99)
POTASSIUM: 4 mmol/L (ref 3.5–5.1)
SODIUM: 141 mmol/L (ref 135–145)
Total Protein: 5.3 g/dL — ABNORMAL LOW (ref 6.5–8.1)

## 2015-02-14 LAB — LIPID PANEL
CHOL/HDL RATIO: 5.9 ratio
Cholesterol: 153 mg/dL (ref 0–200)
HDL: 26 mg/dL — AB (ref >40)
LDL CALC: 87 mg/dL (ref 0–99)
TRIGLYCERIDES: 200 mg/dL — AB (ref <150)
VLDL: 40 mg/dL (ref 0–40)

## 2015-02-14 LAB — GLUCOSE, CAPILLARY
GLUCOSE-CAPILLARY: 139 mg/dL — AB (ref 65–99)
Glucose-Capillary: 115 mg/dL — ABNORMAL HIGH (ref 65–99)
Glucose-Capillary: 130 mg/dL — ABNORMAL HIGH (ref 65–99)
Glucose-Capillary: 249 mg/dL — ABNORMAL HIGH (ref 65–99)
Glucose-Capillary: 76 mg/dL (ref 65–99)
Glucose-Capillary: 98 mg/dL (ref 65–99)
Glucose-Capillary: 99 mg/dL (ref 65–99)

## 2015-02-14 LAB — CREATININE, URINE, RANDOM: CREATININE, URINE: 96.48 mg/dL

## 2015-02-14 LAB — LACTIC ACID, PLASMA: Lactic Acid, Venous: 2.1 mmol/L (ref 0.5–2.0)

## 2015-02-14 LAB — LIPASE, BLOOD: Lipase: 50 U/L (ref 11–51)

## 2015-02-14 MED ORDER — METRONIDAZOLE IN NACL 5-0.79 MG/ML-% IV SOLN
500.0000 mg | Freq: Three times a day (TID) | INTRAVENOUS | Status: DC
  Administered 2015-02-14: 500 mg via INTRAVENOUS
  Filled 2015-02-14 (×3): qty 100

## 2015-02-14 MED ORDER — HYDRALAZINE HCL 20 MG/ML IJ SOLN: 10.0000 mg | Freq: Four times a day (QID) | INTRAMUSCULAR | Status: DC | PRN

## 2015-02-14 MED ORDER — PIPERACILLIN-TAZOBACTAM 3.375 G IVPB
3.3750 g | Freq: Three times a day (TID) | INTRAVENOUS | Status: DC
  Administered 2015-02-14 – 2015-02-16 (×6): 3.375 g via INTRAVENOUS
  Filled 2015-02-14 (×8): qty 50

## 2015-02-14 MED ORDER — IPRATROPIUM-ALBUTEROL 0.5-2.5 (3) MG/3ML IN SOLN: 3.0000 mL | Freq: Four times a day (QID) | RESPIRATORY_TRACT | Status: DC

## 2015-02-14 MED ORDER — IPRATROPIUM-ALBUTEROL 0.5-2.5 (3) MG/3ML IN SOLN: 3.0000 mL | RESPIRATORY_TRACT | Status: DC | PRN

## 2015-02-14 MED ORDER — ACETAMINOPHEN 325 MG PO TABS
650.0000 mg | ORAL_TABLET | Freq: Four times a day (QID) | ORAL | Status: DC | PRN
  Administered 2015-02-14 – 2015-02-16 (×2): 650 mg via ORAL
  Filled 2015-02-14 (×2): qty 2

## 2015-02-14 NOTE — Progress Notes (Signed)
ANTIBIOTIC CONSULT NOTE - INITIAL  Pharmacy Consult for Zosyn Indication: Cholangitis  Allergies  Allergen Reactions  . Codeine Other (See Comments)    slurred speech   Patient Measurements: Height: 5' 4.5" (163.8 cm) Weight: 169 lb 3.2 oz (76.749 kg) IBW/kg (Calculated) : 55.85 Vital Signs: Temp: 101 F (38.3 C) (12/12 0837) Temp Source: Oral (12/12 0837) BP: 148/44 mmHg (12/12 0837) Pulse Rate: 82 (12/12 0837) Intake/Output from previous day: 12/11 0701 - 12/12 0700 In: -  Out: 300 [Emesis/NG output:300] Intake/Output from this shift: Total I/O In: 240 [P.O.:240] Out: -   Labs:  Recent Labs  02/13/15 1857 02/13/15 2045 02/14/15 0805  WBC 22.0*  --  11.8*  HGB 14.7  --  10.7*  PLT 389  --  227  LABCREA  --  96.48  --   CREATININE 1.81*  --  1.44*   Estimated Creatinine Clearance: 30 mL/min (by C-G formula based on Cr of 1.44). No results for input(s): VANCOTROUGH, VANCOPEAK, VANCORANDOM, GENTTROUGH, GENTPEAK, GENTRANDOM, TOBRATROUGH, TOBRAPEAK, TOBRARND, AMIKACINPEAK, AMIKACINTROU, AMIKACIN in the last 72 hours.   Microbiology: Recent Results (from the past 720 hour(s))  Blood culture (routine x 2)     Status: None   Collection Time: 02/03/15  1:35 PM  Result Value Ref Range Status   Specimen Description BLOOD RIGHT ARM  Final   Special Requests BOTTLES DRAWN AEROBIC AND ANAEROBIC 5CC  Final   Culture  Setup Time   Final    GRAM NEGATIVE RODS IN BOTH AEROBIC AND ANAEROBIC BOTTLES CRITICAL RESULT CALLED TO, READ BACK BY AND VERIFIED WITH: B REAP @0202   02/04/15 MKELLY    Culture ENTEROBACTER CLOACAE  Final   Report Status 02/06/2015 FINAL  Final   Organism ID, Bacteria ENTEROBACTER CLOACAE  Final      Susceptibility   Enterobacter cloacae - MIC*    CEFAZOLIN <=4 RESISTANT Resistant     CEFEPIME <=1 SENSITIVE Sensitive     CEFTAZIDIME <=1 SENSITIVE Sensitive     CEFTRIAXONE <=1 SENSITIVE Sensitive     CIPROFLOXACIN <=0.25 SENSITIVE Sensitive      GENTAMICIN <=1 SENSITIVE Sensitive     IMIPENEM <=0.25 SENSITIVE Sensitive     TRIMETH/SULFA <=20 SENSITIVE Sensitive     PIP/TAZO <=4 SENSITIVE Sensitive     * ENTEROBACTER CLOACAE  Blood culture (routine x 2)     Status: None   Collection Time: 02/03/15  2:10 PM  Result Value Ref Range Status   Specimen Description BLOOD LEFT HAND  Final   Special Requests BOTTLES DRAWN AEROBIC AND ANAEROBIC 5CC  Final   Culture  Setup Time   Final    GRAM NEGATIVE RODS AEROBIC BOTTLE ONLY CRITICAL RESULT CALLED TO, READ BACK BY AND VERIFIED WITH: A PETTIFORD @0552  02/05/15 MKELLY    Culture   Final    ENTEROBACTER CLOACAE SUSCEPTIBILITIES PERFORMED ON PREVIOUS CULTURE WITHIN THE LAST 5 DAYS.    Report Status 02/07/2015 FINAL  Final  MRSA PCR Screening     Status: None   Collection Time: 02/03/15 10:13 PM  Result Value Ref Range Status   MRSA by PCR NEGATIVE NEGATIVE Final    Comment:        The GeneXpert MRSA Assay (FDA approved for NASAL specimens only), is one component of a comprehensive MRSA colonization surveillance program. It is not intended to diagnose MRSA infection nor to guide or monitor treatment for MRSA infections.     Medical History: Past Medical History  Diagnosis Date  . Diabetes  mellitus without complication ([place])   . Hypertension   . CKD (chronic kidney disease), stage III   . PVC (premature ventricular contraction)   . Hyperlipidemia   . GERD (gastroesophageal reflux disease)   . Osteopenia   . Chronic low back pain   . Postlaminectomy syndrome   . Right carpal tunnel syndrome   . Pernicious anemia   . Chronic interstitial cystitis   . Microhematuria   . Macular degeneration   . Chest pain     a. 07/2014 Neg MV, nl EF.   Assessment: 81 year old female admitted with acute pancreatitis and suspicion for cholangitis/cholecystitis to start IV Zosyn per pharmacy dosing. GI consulted.   Tm 101, WBC down, LA 2.1.   Zosyn 12/12 >> Flagyl 12/11  >>12/12  12/11 Blood >>   Goal of Therapy:  Clinical resolution of infection  Plan:  Zosyn 3.375g IV q8h-4hr infusion Follow-up clinical status, culture results, and renal function  Sloan Leiter, PharmD, BCPS Clinical Pharmacist [phone] 02/14/2015,10:12 AM

## 2015-02-14 NOTE — Progress Notes (Addendum)
Patient Demographics:    [name], is a 81 y.o. female, DOB - 11-19-1929, [id number]  Admit date - 02/13/2015   Admitting Physician Norval Morton, MD  Outpatient Primary MD for the patient is Irven Shelling, MD  LOS - 1   Chief Complaint  Patient presents with  . Vomiting  . Abdominal Pain        Subjective:    [name] today has, No headache, No chest pain, No abdominal pain - No Nausea, No new weakness tingling or numbness, No Cough - SOB.     Assessment  & Plan :     1. Acute pancreatitis with suspicion for cholangitis/cholecystitis. Had similar presentation a few days ago, at that time she was seen by GI and general surgery and placed on antibiotics with minimal benefit. Imaging shows pancreatic head mass, liver enzymes are mildly elevated and suspicion for CBD stone is low, she is currently pain-free, blood cultures have been drawn. She has been placed on IV Zosyn. GI and general surgery have been consulted. Will check lipid panel as well to rule out other causes of pancreatitis. She may require ERCP/EUS with the possibility of laparoscopic cholecystectomy. Continue nothing by mouth and continue supportive care.   2. History of pancreatic cystic lesion. 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 MRCP stable recently. GI to evaluate.   3. Chronic diastolic CHF with EF [id number] on recent echogram. Currently compensated monitor closely. She is on 40 mg of daily Lasix orally at home which is on hold currently. Continue oral beta blocker.   4. Dyslipidemia. Resume statin once acute issues have resolved.   5. Essential hypertension. Continue oral beta blocker along with As needed hydralazine.   6. Dehydration with  acute renal failure. IV fluids, hold ACE inhibitor and diuretics, repeat BMP in the morning.    7. Chronic stage II to 3 Right heel ulcer. Wound care.    8. History of paroxysmal atrial fibrillation. ChadVASC 2 score of greater than 3. Not noted to be on anticoagulation, goal will be rate controlled, continue beta blocker and monitor. Currently on full dose aspirin which will be continued.   9. DM type II. Currently on sliding scale.  CBG (last 3)   Recent Labs  02/14/15 0044 02/14/15 0412 02/14/15 0845  GLUCAP 249* 139* 98     Lab Results  Component Value Date   HGBA1C 7.9* 01/16/2015       Code Status : Full  Family Communication  : None present  Disposition Plan  : To be decided  Consults  :  GI and general surgery  Procedures  :   Right upper quadrant ultrasound confirming pancreatic head mass  DVT Prophylaxis  :    Heparin   Lab Results  Component Value Date   PLT 227 02/14/2015    Inpatient Medications  Scheduled Meds: . amLODipine  5 mg Oral Daily  . aspirin EC  325 mg Oral Daily  . cholecalciferol  2,000 Units Oral Daily  . cyanocobalamin  1,000 mcg Intramuscular Q30 days  . heparin  5,000 Units Subcutaneous 3 times per day  . insulin aspart  0-9 Units Subcutaneous 6 times per day  .  lithium carbonate  150 mg Oral Q breakfast  . metoprolol  50 mg Oral BID  . mirtazapine  30 mg Oral QHS  . oxybutynin  5 mg Oral BID  . pantoprazole  40 mg Oral Daily  . piperacillin-tazobactam (ZOSYN)  IV  3.375 g Intravenous 3 times per day  . pregabalin  75 mg Oral BID  . Vilazodone HCl  20 mg Oral Daily  . zolpidem  2.5 mg Oral QHS   Continuous Infusions: . sodium chloride 75 mL/hr at 02/13/15 2330   PRN Meds:.acetaminophen, conjugated estrogens, fentaNYL (SUBLIMAZE) injection, hydrALAZINE, ipratropium-albuterol, nitroGLYCERIN, [DISCONTINUED] ondansetron **OR** ondansetron (ZOFRAN) IV  Antibiotics  :     Anti-infectives    Start     Dose/Rate Route  Frequency Ordered Stop   02/14/15 1000  levofloxacin (LEVAQUIN) tablet 750 mg  Status:  Discontinued     750 mg Oral Every 48 hours 02/13/15 2323 02/14/15 0850   02/14/15 1000  piperacillin-tazobactam (ZOSYN) IVPB 3.375 g     3.375 g 12.5 mL/hr over 240 Minutes Intravenous 3 times per day 02/14/15 0911     02/14/15 0600  metroNIDAZOLE (FLAGYL) IVPB 500 mg  Status:  Discontinued     500 mg 100 mL/hr over 60 Minutes Intravenous Every 8 hours 02/14/15 0124 02/14/15 0850   02/13/15 2130  metroNIDAZOLE (FLAGYL) IVPB 500 mg     500 mg 100 mL/hr over 60 Minutes Intravenous  Once 02/13/15 2116 02/13/15 2348        Objective:   Filed Vitals:   02/13/15 2230 02/13/15 2331 02/14/15 0507 02/14/15 0837  BP: 149/64 164/47 133/42 148/44  Pulse: 83 85 77 82  Temp:  97.7 F (36.5 C) 99.6 F (37.6 C) 101 F (38.3 C)  TempSrc:  Oral  Oral  Resp: 23 21 19    Height:  5' 4.5" (1.638 m)    Weight:  76.749 kg (169 lb 3.2 oz)    SpO2: 100% 100% 100% 97%    Wt Readings from Last 3 Encounters:  02/13/15 76.749 kg (169 lb 3.2 oz)  02/08/15 76.8 kg (169 lb 5 oz)  01/17/15 75.8 kg (167 lb 1.7 oz)     Intake/Output Summary (Last 24 hours) at 02/14/15 0925 Last data filed at 02/13/15 1905  Gross per 24 hour  Intake      0 ml  Output    300 ml  Net   -300 ml     Physical Exam  Awake Alert, Oriented X 2, No new F.N deficits, Normal affect .AT,PERRAL Supple Neck,No JVD, No cervical lymphadenopathy appriciated.  Symmetrical Chest wall movement, Good air movement bilaterally, CTAB RRR,No Gallops,Rubs or new Murmurs, No Parasternal Heave +ve B.Sounds, Abd Soft, No tenderness, No organomegaly appriciated, No rebound - guarding or rigidity. No Cyanosis, Clubbing or edema, No new Rash or bruise       Data Review:   Micro Results No results found for this or any previous visit (from the past 240 hour(s)).  Radiology Reports US Abdomen Complete  02/14/2015  CLINICAL DATA:  Pancreatitis  EXAM: ULTRASOUND ABDOMEN COMPLETE COMPARISON:  MRI 02/05/2015.  CT 02/03/2015. FINDINGS: Gallbladder: No gallstones or wall thickening visualized. No sonographic Murphy sign noted. Common bile duct: Diameter: Dilated, 11 mm, similar to prior CT and MRI. Liver: No focal lesion identified. Within normal limits in parenchymal echogenicity. IVC: No abnormality visualized. Pancreas: 2.9 cm cystic area within the pancreatic head as seen on prior imaging. Spleen: Normal size.  No focal abnormality. Right Kidney:  Length: 10.3 cm. Echogenicity within normal limits. No mass or hydronephrosis visualized. Left Kidney: Length: 11.0 cm. Echogenicity within normal limits. No mass or hydronephrosis visualized. Abdominal aorta: No aneurysm visualized. Other findings: None. IMPRESSION: 2.9 cm cystic mass in the pancreatic head as seen on prior imaging. Stable biliary ductal dilatation. Electronically Signed   By: Rolm Baptise M.D.   On: 02/14/2015 07:36   Ct Abdomen Pelvis W Contrast  02/03/2015  CLINICAL DATA:  Fever, abdominal pain, abnormal LFTs EXAM: CT ABDOMEN AND PELVIS WITH CONTRAST TECHNIQUE: Multidetector CT imaging of the abdomen and pelvis was performed using the standard protocol following bolus administration of intravenous contrast. CONTRAST:  79mL OMNIPAQUE IOHEXOL 300 MG/ML  SOLN COMPARISON:  03/23/2010 FINDINGS: Lower chest:  Mild bibasilar chronic interstitial disease. Hepatobiliary: No focal hepatic mass. Intrahepatic biliary ductal dilatation. Dilated common bile duct to the level of the pancreatic head. Mild pericholecystic fluid or wall thickening. Pancreas: 2.8 X 2.4 cm cystic pancreatic head mass with a punctate mural calcification. Spleen: Normal. Adrenals/Urinary Tract: Normal adrenal glands. 10 mm hypodense, fluid attenuating left renal mass most consistent with a cyst. Normal right kidney. No obstructive uropathy or urolithiasis. Unremarkable bladder. Stomach/Bowel: Duo dual diverticulum is noted. No  bowel wall thickening or dilatation. Diverticulosis of the descending and sigmoid colon without evidence of diverticulitis. Rectal fecal impaction. No pneumatosis, pneumoperitoneum or portal venous gas. No abdominal or pelvic free fluid. Small fat containing umbilical hernia. Vascular/Lymphatic: Normal caliber abdominal aorta with atherosclerosis. No abdominal or pelvic lymphadenopathy. Reproductive: Normal uterus.  No adnexal mass. Other: No fluid collection or hematoma. Musculoskeletal: No acute osseous abnormality. No aggressive lytic or sclerotic osseous lesion. Left hip arthroplasty. Degenerative disc disease throughout the lumbar spine with diffuse facet arthropathy. IMPRESSION: 1. No acute abdominal or pelvic pathology. 2. 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 3. Intrahepatic and extrahepatic biliary ductal dilatation with abrupt narrowing at the pancreatic head. No focal ampullary mass or choledocholithiasis is identified. 4. Mild pericholecystic fluid or wall thickening. This appearance can be seen with cholecystitis. If there is further clinical concern, recommend a right upper quadrant ultrasound. Electronically Signed   By: Kathreen Devoid   On: 02/03/2015 17:03   Mr 3d Recon At Scanner  02/05/2015  CLINICAL DATA:  Cholangitis with suspected past common duct stone. Cystic pancreatic head lesion. EXAM: MRI ABDOMEN WITHOUT CONTRAST  (INCLUDING MRCP) TECHNIQUE: Multiplanar multisequence MR imaging of the abdomen was performed. Heavily T2-weighted images of the biliary and pancreatic ducts were obtained, and three-dimensional MRCP images were rendered by post processing. COMPARISON:  CT 02/03/2015 and 03/23/2010. FINDINGS: Lower chest: Cardiomegaly and mild atelectasis at both lung bases, similar to recent CT. No significant pleural effusion. Hepatobiliary: The thin section MRCP images are motion  degraded. No focal hepatic abnormalities are identified. As demonstrated on recent CT, there is intrahepatic and extrahepatic biliary dilatation. The common bile duct measures up to 1.5 cm in diameter. No intraductal calculi are demonstrated. There is mild gallbladder wall thickening and pericholecystic fluid without discrete gallstones. Pancreas: The pancreatic duct is normal in caliber. There is a cystic lesion within the pancreatic head, measuring 2.8 x 2.4 cm. This contains dependent intrinsic T1 shortening, but otherwise appears simple. This has only minimally enlarged from 2012 and is likely a benign finding. There is an adjacent periampullary duodenal diverticulum. No surrounding inflammatory changes. Spleen: Normal in size without focal  abnormality. Adrenals/Urinary Tract: Both adrenal glands appear normal. Both kidneys demonstrate mild cortical thinning and perinephric soft tissue stranding. There are small left renal cysts. No hydronephrosis. Stomach/Bowel: No evidence of bowel wall thickening, distention or surrounding inflammatory change.Minimal ascites and mesenteric edema. Vascular/Lymphatic: There are no enlarged abdominal lymph nodes. Aortoiliac atherosclerosis better demonstrated on CT. Duplication of the IVC. Other: None. Musculoskeletal: Multilevel spondylosis and scoliosis noted. No acute osseous findings. IMPRESSION: 1. Persistent biliary dilatation of undetermined etiology. No evidence of choledocholithiasis. Of note, this biliary dilatation appears chronic and not grossly changed from 2012. 2. Gallbladder wall thickening and pericholecystic fluid may reflect cholecystitis in the appropriate clinical context. 3. Cystic lesion in the pancreatic head demonstrates no aggressive characteristics and is only mildly enlarged from 2012, likely a benign finding. Electronically Signed   By: Richardean Sale M.D.   On: 02/05/2015 17:32   Dg Chest Port 1 View  02/06/2015  CLINICAL DATA:  Dyspnea and mid  sternal chest pain. EXAM: PORTABLE CHEST 1 VIEW COMPARISON:  02/03/2015 FINDINGS: Lung volumes are extremely low bilaterally. Despite low volumes, a degree of interstitial edema is suspected. There may be small bilateral pleural effusions. The heart size is stable. IMPRESSION: Low lung volumes with suspicion of interstitial edema. There may be small bilateral pleural effusions. Electronically Signed   By: Aletta Edouard M.D.   On: 02/06/2015 09:00   Dg Chest Portable 1 View  02/03/2015  CLINICAL DATA:  Fever EXAM: PORTABLE CHEST 1 VIEW COMPARISON:  01/14/2015 FINDINGS: Heart size mildly enlarged. Mild vascular congestion. Negative for edema or effusion. Prominent lung markings are similar to prior studies consistent with mild scarring. IMPRESSION: Mild congestive heart failure without significant edema or effusion. No definite pneumonia. Electronically Signed   By: Franchot Gallo M.D.   On: 02/03/2015 14:18   Mr Lambert Mody Cm/mrcp  02/05/2015  CLINICAL DATA:  Cholangitis with suspected past common duct stone. Cystic pancreatic head lesion. EXAM: MRI ABDOMEN WITHOUT CONTRAST  (INCLUDING MRCP) TECHNIQUE: Multiplanar multisequence MR imaging of the abdomen was performed. Heavily T2-weighted images of the biliary and pancreatic ducts were obtained, and three-dimensional MRCP images were rendered by post processing. COMPARISON:  CT 02/03/2015 and 03/23/2010. FINDINGS: Lower chest: Cardiomegaly and mild atelectasis at both lung bases, similar to recent CT. No significant pleural effusion. Hepatobiliary: The thin section MRCP images are motion degraded. No focal hepatic abnormalities are identified. As demonstrated on recent CT, there is intrahepatic and extrahepatic biliary dilatation. The common bile duct measures up to 1.5 cm in diameter. No intraductal calculi are demonstrated. There is mild gallbladder wall thickening and pericholecystic fluid without discrete gallstones. Pancreas: The pancreatic duct is normal in  caliber. There is a cystic lesion within the pancreatic head, measuring 2.8 x 2.4 cm. This contains dependent intrinsic T1 shortening, but otherwise appears simple. This has only minimally enlarged from 2012 and is likely a benign finding. There is an adjacent periampullary duodenal diverticulum. No surrounding inflammatory changes. Spleen: Normal in size without focal abnormality. Adrenals/Urinary Tract: Both adrenal glands appear normal. Both kidneys demonstrate mild cortical thinning and perinephric soft tissue stranding. There are small left renal cysts. No hydronephrosis. Stomach/Bowel: No evidence of bowel wall thickening, distention or surrounding inflammatory change.Minimal ascites and mesenteric edema. Vascular/Lymphatic: There are no enlarged abdominal lymph nodes. Aortoiliac atherosclerosis better demonstrated on CT. Duplication of the IVC. Other: None. Musculoskeletal: Multilevel spondylosis and scoliosis noted. No acute osseous findings. IMPRESSION: 1. Persistent biliary dilatation of undetermined etiology. No evidence of choledocholithiasis. Of  note, this biliary dilatation appears chronic and not grossly changed from 2012. 2. Gallbladder wall thickening and pericholecystic fluid may reflect cholecystitis in the appropriate clinical context. 3. Cystic lesion in the pancreatic head demonstrates no aggressive characteristics and is only mildly enlarged from 2012, likely a benign finding. Electronically Signed   By: Richardean Sale M.D.   On: 02/05/2015 17:32     CBC  Recent Labs Lab 02/09/15 0544 02/13/15 1857 02/14/15 0805  WBC 5.5 22.0* 11.8*  HGB 12.5 14.7 10.7*  HCT 40.5 47.8* 34.9*  PLT 159 389 227  MCV 91.4 92.6 90.6  MCH 28.2 28.5 27.8  MCHC 30.9 30.8 30.7  RDW 12.7 13.3 13.5    Chemistries   Recent Labs Lab 02/09/15 0544 02/13/15 1857 02/14/15 0805  NA 142 141 141  K 3.6 4.1 4.0  CL 98* 102 106  CO2 33* 25 25  GLUCOSE 150* 212* 108*  BUN 8 15 18   CREATININE 1.08*  1.81* 1.44*  CALCIUM 9.7 9.7 8.4*  AST 14* 85* 36  ALT 30 28 20   ALKPHOS 118 226* 162*  BILITOT 0.9 1.1 1.1   ------------------------------------------------------------------------------------------------------------------ estimated creatinine clearance is 30 mL/min (by C-G formula based on Cr of 1.44). ------------------------------------------------------------------------------------------------------------------ No results for input(s): HGBA1C in the last 72 hours. ------------------------------------------------------------------------------------------------------------------ No results for input(s): CHOL, HDL, LDLCALC, TRIG, CHOLHDL, LDLDIRECT in the last 72 hours. ------------------------------------------------------------------------------------------------------------------ No results for input(s): TSH, T4TOTAL, T3FREE, THYROIDAB in the last 72 hours.  Invalid input(s): FREET3 ------------------------------------------------------------------------------------------------------------------ No results for input(s): VITAMINB12, FOLATE, FERRITIN, TIBC, IRON, RETICCTPCT in the last 72 hours.  Coagulation profile No results for input(s): INR, PROTIME in the last 168 hours.  No results for input(s): DDIMER in the last 72 hours.  Cardiac Enzymes No results for input(s): CKMB, TROPONINI, MYOGLOBIN in the last 168 hours.  Invalid input(s): CK ------------------------------------------------------------------------------------------------------------------ Invalid input(s): POCBNP  Lab Results  Component Value Date   CHOL 196 11/19/2014   HDL 33* 11/19/2014   LDLCALC 85 11/19/2014   TRIG 390* 11/19/2014   CHOLHDL 5.9 11/19/2014    Time Spent in minutes   35   Younis Mathey K M.D on 02/14/2015 at 9:25 AM  Between 7am to 7pm - Pager - [phone]  After 7pm go to [email or web address] - password [facility]  [facility] -  Office  [phone]

## 2015-02-14 NOTE — Evaluation (Signed)
Physical Therapy Evaluation Patient Details Name: [name] MRN: [id number] DOB: [birth year] Today's Date: 02/14/2015   History of Present Illness  [name] is a 81 year old female with a past medical history significant for Htn, Hld, DM2, CKD stage III, TIA/stroke w/residual left-sided weakness and slurred speech; who presents with epigastric pain, nausea, and vomiting.  Clinical Impression  Pt admitted with above diagnosis. Pt currently with functional limitations due to the deficits listed below (see PT Problem List). Pt ambulated 53' with RW before discharge a week ago, required mod A +2 for pivot transfer to and from [facility]. Pt with generalized weakness and lethargy.  Pt will benefit from skilled PT to increase their independence and safety with mobility to allow discharge to the venue listed below.       Follow Up Recommendations Home health PT;Supervision/Assistance - 24 hour    Equipment Recommendations  None recommended by PT    Recommendations for Other Services       Precautions / Restrictions Precautions Precautions: Fall Restrictions Weight Bearing Restrictions: No      Mobility  Bed Mobility               General bed mobility comments: pt received in chair  Transfers Overall transfer level: Needs assistance Equipment used: Rolling walker (2 wheeled) Transfers: Sit to/from [facility] Sit to Stand: +2 physical assistance;Mod assist Stand pivot transfers: +2 physical assistance;Mod assist       General transfer comment: pt needed +2 mod A for power up from chair and BSC. Needed +2 mod to step to and from [facility], left knee buckling, trunk flexed, facilitation needed at hips. First transfer done with RW and then pt stood to RW for perineal care. 3rd stand with pivot was therapist in front of pt as pt too fatigued to direct RW  Ambulation/Gait             General Gait Details: too fatigued to ambulate today  Stairs             Wheelchair Mobility    Modified Rankin (Stroke Patients Only)       Balance Overall balance assessment: Needs assistance Sitting-balance support: Bilateral upper extremity supported Sitting balance-Leahy Scale: Poor Sitting balance - Comments: left lean, needed UE support to maintain upright sitting Postural control: Left lateral lean Standing balance support: Bilateral upper extremity supported Standing balance-Leahy Scale: Poor Standing balance comment: mod A to maintain standing even with UE support                             Pertinent Vitals/Pain Pain Assessment: Faces Faces Pain Scale: Hurts little more Pain Location: all over (fibromyalgia) Pain Descriptors / Indicators: Tender Pain Intervention(s): Monitored during session;Repositioned  O2 sats 98% on 2L O2    Home Living Family/patient expects to be discharged to:: Private residence Living Arrangements: Spouse/significant other Available Help at Discharge: Family;Personal care attendant;Available 24 hours/day Type of Home: House Home Access: Ramped entrance     Home Layout: Two level;Able to live on main level with bedroom/bathroom Home Equipment: Gilford Rile - 2 wheels;Shower seat;Bedside commode;Wheelchair - manual;Walker - 4 wheels;Hand held shower head Additional Comments: pt was just discharged from here on 02/09/15, daughter reports she didn't have much therapy inpt and HHPT had only come once before she had to come back    Prior Function Level of Independence: Needs assistance   Gait / Transfers Assistance Needed: daughter reports that she  needed assistance with RW  ADL's / Homemaking Assistance Needed: assistance needed for bathing and dressing        Hand Dominance   Dominant Hand: Right    Extremity/Trunk Assessment   Upper Extremity Assessment: Generalized weakness;Defer to OT evaluation           Lower Extremity Assessment: Generalized weakness;RLE deficits/detail;LLE  deficits/detail RLE Deficits / Details: difficulty maintaining contraction longer than 2 secs, hip flex 3-/5, knee ext 3/5, knee flex 3/5 LLE Deficits / Details: hip flex 2/5, knee ext 3-/5, knee flex 3/5, knee buckling with transfers  Cervical / Trunk Assessment: Kyphotic  Communication   Communication: Expressive difficulties  Cognition Arousal/Alertness: Lethargic Behavior During Therapy: Flat affect Overall Cognitive Status: Difficult to assess                      General Comments General comments (skin integrity, edema, etc.): pt's daughter reports that even at this level, wants to take pt home and family can provide care needed    Exercises General Exercises - Lower Extremity Ankle Circles/Pumps: AROM;Both;10 reps;Seated Long Arc Quad: AROM;Both;10 reps;Seated Heel Slides: AROM;Both;10 reps;Seated Hip Flexion/Marching: AROM;Both;10 reps;Seated      Assessment/Plan    PT Assessment Patient needs continued PT services  PT Diagnosis Difficulty walking;Abnormality of gait;Generalized weakness   PT Problem List Decreased strength;Decreased activity tolerance;Decreased balance;Decreased mobility;Decreased coordination;Decreased cognition;Decreased knowledge of precautions;Decreased knowledge of use of DME;Pain  PT Treatment Interventions DME instruction;Gait training;Functional mobility training;Therapeutic exercise;Therapeutic activities;Balance training;Neuromuscular re-education;Patient/family education   PT Goals (Current goals can be found in the Care Plan section) Acute Rehab PT Goals Patient Stated Goal: Go home soon PT Goal Formulation: With patient/family Time For Goal Achievement: 02/28/15 Potential to Achieve Goals: Fair    Frequency Min 3X/week   Barriers to discharge        Co-evaluation               End of Session Equipment Utilized During Treatment: Gait belt;Oxygen Activity Tolerance: Patient limited by lethargy;Patient limited by  fatigue Patient left: in chair;with call bell/phone within reach;with family/visitor present;with chair alarm set Nurse Communication: Mobility status         Time: 1110-1140 PT Time Calculation (min) (ACUTE ONLY): 30 min   Charges:   PT Evaluation $Initial PT Evaluation Tier I: 1 Procedure PT Treatments $Therapeutic Activity: 8-22 mins   PT G Codes:       Leighton Roach, PT  Acute Rehab Services  [phone]  Leighton Roach 02/14/2015, 1:36 PM

## 2015-02-14 NOTE — Progress Notes (Signed)
[facility] Progress Note     Subjective: 81 y/o white female with a PMH significant for Htn, Hld, DM2, CKD stage III, TIA/stroke w/residual left-sided weakness and slurred speech known to our service after recent admission on 02/03/15 - 02/09/15 for pancreatic head cyst  CBD partial obstruction with biliary dilatation of undetermined etiology.  Looks ill this am, very flushed.  Confused, not able to answer most of my questions.  Denies any pain or N/V.  She knows she came in for vomiting.  She was pending an ERCP by GI in 2 weeks.    Objective: Vital signs in last 24 hours: Temp:  [97.6 F (36.4 C)-99.6 F (37.6 C)] 99.6 F (37.6 C) (12/12 0507) Pulse Rate:  [66-99] 77 (12/12 0507) Resp:  [13-23] 19 (12/12 0507) BP: (89-179)/(42-64) 133/42 mmHg (12/12 0507) SpO2:  [92 %-100 %] 100 % (12/12 0507) Weight:  [76.749 kg (169 lb 3.2 oz)] 76.749 kg (169 lb 3.2 oz) (12/11 2331) Last BM Date: 02/14/15  Intake/Output from previous day: 12/11 0701 - 12/12 0700 In: -  Out: 300 [Emesis/NG output:300] Intake/Output this shift:    PE: General: WD/WN white female who is laying in bed who looks ill, very flushed HEENT: head is normocephalic, atraumatic.  Sclera are noninjected.  PERRL.  Ears and nose without any masses or lesions.  Mouth is pink and moist Heart: regular, rate, and rhythm.  Normal s1,s2. No obvious murmurs, gallops, or rubs noted.  Palpable radial and pedal pulses bilaterally Lungs: CTAB, no wheezes, rhonchi, or rales noted.  Respiratory effort nonlabored Abd: soft, NT/ND, +BS, no masses, hernias, or organomegaly MS: all 4 extremities are symmetrical with no cyanosis, clubbing, or edema. Skin: warm and dry with no masses, lesions, or rashes Psych: Awake, but not oriented to place or time.  Not able to answer many my questions.  Lab Results:   Recent Labs  02/13/15 1857  WBC 22.0*  HGB 14.7  HCT 47.8*  PLT 389   BMET  Recent Labs  02/13/15 1857  NA 141   K 4.1  CL 102  CO2 25  GLUCOSE 212*  BUN 15  CREATININE 1.81*  CALCIUM 9.7   PT/INR No results for input(s): LABPROT, INR in the last 72 hours. CMP     Component Value Date/Time   NA 141 02/13/2015 1857   K 4.1 02/13/2015 1857   CL 102 02/13/2015 1857   CO2 25 02/13/2015 1857   GLUCOSE 212* 02/13/2015 1857   BUN 15 02/13/2015 1857   CREATININE 1.81* 02/13/2015 1857   CALCIUM 9.7 02/13/2015 1857   PROT 7.2 02/13/2015 1857   ALBUMIN 3.5 02/13/2015 1857   AST 85* 02/13/2015 1857   ALT 28 02/13/2015 1857   ALKPHOS 226* 02/13/2015 1857   BILITOT 1.1 02/13/2015 1857   GFRNONAA 25* 02/13/2015 1857   GFRAA 29* 02/13/2015 1857   Lipase     Component Value Date/Time   LIPASE 1505* 02/13/2015 1857       Studies/Results: US Abdomen Complete  02/14/2015  CLINICAL DATA:  Pancreatitis EXAM: ULTRASOUND ABDOMEN COMPLETE COMPARISON:  MRI 02/05/2015.  CT 02/03/2015. FINDINGS: Gallbladder: No gallstones or wall thickening visualized. No sonographic Murphy sign noted. Common bile duct: Diameter: Dilated, 11 mm, similar to prior CT and MRI. Liver: No focal lesion identified. Within normal limits in parenchymal echogenicity. IVC: No abnormality visualized. Pancreas: 2.9 cm cystic area within the pancreatic head as seen on prior imaging. Spleen: Normal size.  No focal abnormality. Right Kidney:  Length: 10.3 cm. Echogenicity within normal limits. No mass or hydronephrosis visualized. Left Kidney: Length: 11.0 cm. Echogenicity within normal limits. No mass or hydronephrosis visualized. Abdominal aorta: No aneurysm visualized. Other findings: None. IMPRESSION: 2.9 cm cystic mass in the pancreatic head as seen on prior imaging. Stable biliary ductal dilatation. Electronically Signed   By: Rolm Baptise M.D.   On: 02/14/2015 07:36    Anti-infectives: Anti-infectives    Start     Dose/Rate Route Frequency Ordered Stop   02/14/15 1000  levofloxacin (LEVAQUIN) tablet 750 mg     750 mg Oral Every 48  hours 02/13/15 2323     02/14/15 0600  metroNIDAZOLE (FLAGYL) IVPB 500 mg     500 mg 100 mL/hr over 60 Minutes Intravenous Every 8 hours 02/14/15 0124     02/13/15 2130  metroNIDAZOLE (FLAGYL) IVPB 500 mg     500 mg 100 mL/hr over 60 Minutes Intravenous  Once 02/13/15 2116 02/13/15 2348       Assessment/Plan Pancreatic head cyst  CBD partial obstruction with biliary dilatation of undetermined etiology Biliary pancreatitis Elevated Alk phos and AST Leukocytosis - 22.0 -Recent admission from 02/03/15 - 02/09/15 -Dr. Hulen Skains, last week was only be agreeable to proceed with a lap chole if patient had pre-operative ERCP to define anatomy and better evaluate her ducts prior to surgical intervention.  Will discuss with the DOW Dr. Ninfa Linden. -She appears ill, concerned she still needs ERCP prior to proceeding with lap chole.  Now with pancreatitis.  Further recommendations to come.  Addendum: 1.  Talked with Dr. Ninfa Linden, will order HIDA scan for am   LOS: 1 day    Nat Christen 02/14/2015, 8:18 AM Pager: [phone]

## 2015-02-14 NOTE — Consult Note (Signed)
[facility] Consult Note  Referring Provider: No ref. provider found Primary Care Physician:  Irven Shelling, MD Primary Gastroenterologist:  Dr.  Laurel Dimmer Complaint: Abdominal pain nausea and vomiting. HPI: [name] is an 81 y.o. white female  multiple medical problems who is admitted to December 1 seventh with abdominal pain and was found to have gallstones and dilated common bile duct on ultrasound CT and MRCP was no definite stones and a 2-1/2 cm cystic complex lesion in the head of the pancreas. She had minimal liver enzyme elevations which normalized with a normal lipase. It was decided to send her home but she was still can plan of vague abdominal pain and malaise. Yesterday she developed significantly worse abdominal pain nausea and vomiting and had a lipase greater than 1000 with no rise in liver function test. Her white blood cell count was 22,000 and this morning is 11,000.  Past Medical History  Diagnosis Date  . Diabetes mellitus without complication ([place])   . Hypertension   . CKD (chronic kidney disease), stage III   . PVC (premature ventricular contraction)   . Hyperlipidemia   . GERD (gastroesophageal reflux disease)   . Osteopenia   . Chronic low back pain   . Postlaminectomy syndrome   . Right carpal tunnel syndrome   . Pernicious anemia   . Chronic interstitial cystitis   . Microhematuria   . Macular degeneration   . Chest pain     a. 07/2014 Neg MV, nl EF.    History reviewed. No pertinent past surgical history.  Medications Prior to Admission  Medication Sig Dispense Refill  . acetaminophen (TYLENOL) 500 MG tablet Take 500 mg by mouth every 6 (six) hours as needed for mild pain.    Marland Kitchen amLODipine (NORVASC) 5 MG tablet Take 1 tablet (5 mg total) by mouth daily. 30 tablet 3  . aspirin EC 325 MG EC tablet Take 1 tablet (325 mg total) by mouth daily. 30 tablet 3  . atorvastatin (LIPITOR) 10 MG tablet Take 5 mg by mouth daily.     . cholecalciferol  (VITAMIN D) 1000 UNITS tablet Take 2,000 Units by mouth daily.     Marland Kitchen conjugated estrogens (PREMARIN) vaginal cream Place 1 Applicatorful vaginally daily as needed (burning).     . cyanocobalamin (,VITAMIN B-12,) 1000 MCG/ML injection Inject 1,000 mcg into the muscle every 30 (thirty) days.    . furosemide (LASIX) 20 MG tablet Take 2 tablets (40 mg total) by mouth daily. 30 tablet   . glimepiride (AMARYL) 1 MG tablet Take 2 tablets (2 mg total) by mouth every morning. (Patient taking differently: Take 2 mg by mouth daily with breakfast. ) 30 tablet 0  . levofloxacin (LEVAQUIN) 750 MG tablet Take 1 tablet (750 mg total) by mouth every other day. For 9 days (Patient taking differently: Take 750 mg by mouth every other day. 9 day course started 02/10/15) 5 tablet 0  . lisinopril (PRINIVIL,ZESTRIL) 20 MG tablet Take 1 tablet (20 mg total) by mouth every evening. (Patient taking differently: Take 20 mg by mouth at bedtime. ) 60 tablet 0  . lithium carbonate 150 MG capsule Take 150 mg by mouth daily.    . metoprolol (LOPRESSOR) 50 MG tablet Take 1 tablet (50 mg total) by mouth 2 (two) times daily. 60 tablet 0  . mirtazapine (REMERON) 30 MG tablet Take 30 mg by mouth at bedtime.     . nitroGLYCERIN (NITROSTAT) 0.4 MG SL tablet Place 1 tablet (0.4 mg  total) under the tongue every 5 (five) minutes as needed for chest pain. 30 tablet 0  . omeprazole (PRILOSEC) 40 MG capsule Take 40 mg by mouth at bedtime.     Marland Kitchen oxybutynin (DITROPAN) 5 MG tablet Take 5 mg by mouth 2 (two) times daily.     . pregabalin (LYRICA) 75 MG capsule Take 75 mg by mouth 2 (two) times daily.    . traMADol (ULTRAM) 50 MG tablet Take 1 tablet (50 mg total) by mouth 2 (two) times daily as needed (pain). (Patient taking differently: Take 50 mg by mouth 2 (two) times daily. ) 30 tablet 0  . Vilazodone HCl 20 MG TABS Take 20 mg by mouth daily. Viibryd    . zolpidem (AMBIEN) 5 MG tablet Take 2.5 mg by mouth at bedtime.       Allergies:   Allergies  Allergen Reactions  . Codeine Other (See Comments)    slurred speech    Family History  Problem Relation Age of Onset  . Arthritis Mother   . Heart attack Father     Social History:  reports that she has never smoked. She has never used smokeless tobacco. She reports that she does not drink alcohol. Her drug history is not on file.  Review of Systems: negative except as above  Blood pressure 148/44, pulse 82, temperature 101 F (38.3 C), temperature source Oral, resp. rate 19, height 5' 4.5" (1.638 m), weight 76.749 kg (169 lb 3.2 oz), SpO2 97 %. Head: Normocephalic, without obvious abnormality, atraumatic Neck: no adenopathy, no carotid bruit, no JVD, supple, symmetrical, trachea midline and thyroid not enlarged, symmetric, no tenderness/mass/nodules Resp: clear to auscultation bilaterally Cardio: regular rate and rhythm, S1, S2 normal, no murmur, click, rub or gallop GI: Abdomen mildly distended with normoactive bowel sounds. Moderate epigastric tenderness. No hepatosplenomegaly mass or guarding  Extremities: extremities normal, atraumatic, no cyanosis or edema  Results for orders placed or performed during the hospital encounter of 02/13/15 (from the past 48 hour(s))  Lipase, blood     Status: Abnormal   Collection Time: 02/13/15  6:57 PM  Result Value Ref Range   Lipase 1505 (H) 11 - 51 U/L    Comment: RESULTS CONFIRMED BY MANUAL DILUTION  Comprehensive metabolic panel     Status: Abnormal   Collection Time: 02/13/15  6:57 PM  Result Value Ref Range   Sodium 141 135 - 145 mmol/L   Potassium 4.1 3.5 - 5.1 mmol/L   Chloride 102 101 - 111 mmol/L   CO2 25 22 - 32 mmol/L   Glucose, Bld 212 (H) 65 - 99 mg/dL   BUN 15 6 - 20 mg/dL   Creatinine, Ser 1.81 (H) 0.44 - 1.00 mg/dL   Calcium 9.7 8.9 - 10.3 mg/dL   Total Protein 7.2 6.5 - 8.1 g/dL   Albumin 3.5 3.5 - 5.0 g/dL   AST 85 (H) 15 - 41 U/L   ALT 28 14 - 54 U/L   Alkaline Phosphatase 226 (H) 38 - 126 U/L    Total Bilirubin 1.1 0.3 - 1.2 mg/dL   GFR calc non Af Amer 25 (L) >60 mL/min   GFR calc Af Amer 29 (L) >60 mL/min    Comment: (NOTE) The eGFR has been calculated using the CKD EPI equation. This calculation has not been validated in all clinical situations. eGFR's persistently <60 mL/min signify possible Chronic Kidney Disease.    Anion gap 14 5 - 15  CBC     Status:  Abnormal   Collection Time: 02/13/15  6:57 PM  Result Value Ref Range   WBC 22.0 (H) 4.0 - 10.5 K/uL   RBC 5.16 (H) 3.87 - 5.11 MIL/uL   Hemoglobin 14.7 12.0 - 15.0 g/dL   HCT 47.8 (H) 36.0 - 46.0 %   MCV 92.6 78.0 - 100.0 fL   MCH 28.5 26.0 - 34.0 pg   MCHC 30.8 30.0 - 36.0 g/dL   RDW 13.3 11.5 - 15.5 %   Platelets 389 150 - 400 K/uL  Urinalysis, Routine w reflex microscopic (not at [facility])     Status: Abnormal   Collection Time: 02/13/15  8:45 PM  Result Value Ref Range   Color, Urine YELLOW YELLOW   APPearance CLOUDY (A) CLEAR   Specific Gravity, Urine 1.016 1.005 - 1.030   pH 5.5 5.0 - 8.0   Glucose, UA 100 (A) NEGATIVE mg/dL   Hgb urine dipstick NEGATIVE NEGATIVE   Bilirubin Urine NEGATIVE NEGATIVE   Ketones, ur NEGATIVE NEGATIVE mg/dL   Protein, ur 30 (A) NEGATIVE mg/dL   Nitrite NEGATIVE NEGATIVE   Leukocytes, UA NEGATIVE NEGATIVE  Urine microscopic-add on     Status: Abnormal   Collection Time: 02/13/15  8:45 PM  Result Value Ref Range   Squamous Epithelial / LPF 0-5 (A) NONE SEEN   WBC, UA 0-5 0 - 5 WBC/hpf   RBC / HPF 0-5 0 - 5 RBC/hpf   Bacteria, UA RARE (A) NONE SEEN   Casts GRANULAR CAST (A) NEGATIVE    Comment: HYALINE CASTS  Creatinine, urine, random     Status: None   Collection Time: 02/13/15  8:45 PM  Result Value Ref Range   Creatinine, Urine 96.48 mg/dL  Lactic acid, plasma     Status: None   Collection Time: 02/13/15  9:26 PM  Result Value Ref Range   Lactic Acid, Venous 1.6 0.5 - 2.0 mmol/L  Lactic acid, plasma     Status: Abnormal   Collection Time: 02/14/15 12:00 AM  Result  Value Ref Range   Lactic Acid, Venous 2.1 (HH) 0.5 - 2.0 mmol/L    Comment: CRITICAL RESULT CALLED TO, READ BACK BY AND VERIFIED WITH: [facility] S,RN 02/14/15 0111 WAYK   Glucose, capillary     Status: Abnormal   Collection Time: 02/14/15 12:44 AM  Result Value Ref Range   Glucose-Capillary 249 (H) 65 - 99 mg/dL   Comment 1 Notify RN    Comment 2 Document in Chart   Glucose, capillary     Status: Abnormal   Collection Time: 02/14/15  4:12 AM  Result Value Ref Range   Glucose-Capillary 139 (H) 65 - 99 mg/dL   Comment 1 Notify RN    Comment 2 Document in Chart   Comprehensive metabolic panel     Status: Abnormal   Collection Time: 02/14/15  8:05 AM  Result Value Ref Range   Sodium 141 135 - 145 mmol/L   Potassium 4.0 3.5 - 5.1 mmol/L   Chloride 106 101 - 111 mmol/L   CO2 25 22 - 32 mmol/L   Glucose, Bld 108 (H) 65 - 99 mg/dL   BUN 18 6 - 20 mg/dL   Creatinine, Ser 1.44 (H) 0.44 - 1.00 mg/dL   Calcium 8.4 (L) 8.9 - 10.3 mg/dL   Total Protein 5.3 (L) 6.5 - 8.1 g/dL   Albumin 2.6 (L) 3.5 - 5.0 g/dL   AST 36 15 - 41 U/L   ALT 20 14 - 54 U/L  Alkaline Phosphatase 162 (H) 38 - 126 U/L   Total Bilirubin 1.1 0.3 - 1.2 mg/dL   GFR calc non Af Amer 33 (L) >60 mL/min   GFR calc Af Amer 38 (L) >60 mL/min    Comment: (NOTE) The eGFR has been calculated using the CKD EPI equation. This calculation has not been validated in all clinical situations. eGFR's persistently <60 mL/min signify possible Chronic Kidney Disease.    Anion gap 10 5 - 15  CBC     Status: Abnormal   Collection Time: 02/14/15  8:05 AM  Result Value Ref Range   WBC 11.8 (H) 4.0 - 10.5 K/uL   RBC 3.85 (L) 3.87 - 5.11 MIL/uL   Hemoglobin 10.7 (L) 12.0 - 15.0 g/dL    Comment: REPEATED TO VERIFY DELTA CHECK NOTED    HCT 34.9 (L) 36.0 - 46.0 %   MCV 90.6 78.0 - 100.0 fL   MCH 27.8 26.0 - 34.0 pg   MCHC 30.7 30.0 - 36.0 g/dL   RDW 13.5 11.5 - 15.5 %   Platelets 227 150 - 400 K/uL  Lipase, blood     Status: None    Collection Time: 02/14/15  8:05 AM  Result Value Ref Range   Lipase 50 11 - 51 U/L  Glucose, capillary     Status: None   Collection Time: 02/14/15  8:45 AM  Result Value Ref Range   Glucose-Capillary 98 65 - 99 mg/dL   US Abdomen Complete  02/14/2015  CLINICAL DATA:  Pancreatitis EXAM: ULTRASOUND ABDOMEN COMPLETE COMPARISON:  MRI 02/05/2015.  CT 02/03/2015. FINDINGS: Gallbladder: No gallstones or wall thickening visualized. No sonographic Murphy sign noted. Common bile duct: Diameter: Dilated, 11 mm, similar to prior CT and MRI. Liver: No focal lesion identified. Within normal limits in parenchymal echogenicity. IVC: No abnormality visualized. Pancreas: 2.9 cm cystic area within the pancreatic head as seen on prior imaging. Spleen: Normal size.  No focal abnormality. Right Kidney: Length: 10.3 cm. Echogenicity within normal limits. No mass or hydronephrosis visualized. Left Kidney: Length: 11.0 cm. Echogenicity within normal limits. No mass or hydronephrosis visualized. Abdominal aorta: No aneurysm visualized. Other findings: None. IMPRESSION: 2.9 cm cystic mass in the pancreatic head as seen on prior imaging. Stable biliary ductal dilatation. Electronically Signed   By: Rolm Baptise M.D.   On: 02/14/2015 07:36    Assessment: 1. Acute pancreatitis, presumed secondary to passed gallstone, no evidence of cholangitis currently  2. Gallstones  3. Cystic complex lesion in head of the pancreas  Plan:  1. May or may not need ERCP hopefully less likely need for cholecystectomy. Perhaps has passed her only common bile duct stone but certainly not clear.  2. Will discuss with partners whether EUS might be a preferred initial next investigation to rule out retained common bile duct stones, as well as to better assess the cystic head lesion, both for clarification of likely morphology and histology as well as to determine whether it is causing biliary obstruction requiring stenting. We'll follow with you.   , C 02/14/2015, 10:31 AM  Pager [phone] If no answer or after 5 PM call [phone]

## 2015-02-14 NOTE — Care Management Note (Signed)
Case Management Note  Patient Details  Name: [name] MRN: [id number] Date of Birth: [birth year]  Subjective/Objective:                    Action/Plan:  Await PT eval and MD orders  Expected Discharge Date:                  Expected Discharge Plan:  Home/Self Care  In-House Referral:     Discharge planning Services     Post Acute Care Choice:    Choice offered to:     DME Arranged:    DME Agency:     HH Arranged:    [place] Agency:     Status of Service:  In process, will continue to follow  Medicare Important Message Given:    Date Medicare IM Given:    Medicare IM give by:    Date Additional Medicare IM Given:    Additional Medicare Important Message give by:     If discussed at [place] of Stay Meetings, dates discussed:    Additional Comments:  Marilu Favre, RN 02/14/2015, 8:24 AM

## 2015-02-14 NOTE — Consult Note (Addendum)
WOC wound consult note Reason for Consult: Consult requested for right foot.  Pt familiar to [place] from previous admission on 11/10 and had several wounds at that time which have healed. Daughter at bedside to assess site. Wound type: Right plantar heel with dry yellow callous, .2X.2cm Drainage (amount, consistency, odor) No odor or drainage or open wound; no topical treatment indicated at this time.  Dry peeling skin surrounding wound. Please re-consult if further assistance is needed.  Thank-you,  Julien Girt MSN, [place], [place], [place], [place]

## 2015-02-14 NOTE — Care Management Note (Signed)
Case Management Note  Patient Details  Name: ESTER TOLEN MRN: [id number] Date of Birth: 05/04/1929  Subjective/Objective:                    Action/Plan:  Patient active with [place] for RN,PT, OT . Entered orders . Spoke with Joellen [facility] at [facility] . Patient currently asleep , called daughter no answer . Will follow up  Expected Discharge Date:                  Expected Discharge Plan:  [place]  In-House Referral:     Discharge planning Services  CM Consult  Post Acute Care Choice:  Home Health Choice offered to:     DME Arranged:    DME Agency:     HH Arranged:    [place] Agency:     Status of Service:  In process, will continue to follow  Medicare Important Message Given:    Date Medicare IM Given:    Medicare IM give by:    Date Additional Medicare IM Given:    Additional Medicare Important Message give by:     If discussed at [place] of Stay Meetings, dates discussed:    Additional Comments:  Marilu Favre, RN 02/14/2015, 2:32 PM

## 2015-02-14 NOTE — Care Management Note (Signed)
Case Management Note  Patient Details  Name: [name] MRN: [id number] Date of Birth: 01-Jul-1929  Subjective/Objective:                    Action/Plan:  Initial UR completed  Expected Discharge Date:                  Expected Discharge Plan:  Home/Self Care  In-House Referral:     Discharge planning Services     Post Acute Care Choice:    Choice offered to:     DME Arranged:    DME Agency:     HH Arranged:    [place] Agency:     Status of Service:  In process, will continue to follow  Medicare Important Message Given:    Date Medicare IM Given:    Medicare IM give by:    Date Additional Medicare IM Given:    Additional Medicare Important Message give by:     If discussed at [place] of Stay Meetings, dates discussed:    Additional Comments:  Marilu Favre, RN 02/14/2015, 11:48 AM

## 2015-02-14 NOTE — Progress Notes (Signed)
Pt admitted from ED with RUQ abd pain, pt a/o, hx of CVA with left sided weakness and delayed speech, no c/o pain, pt NPO with IVF NS @ 75 ml/hr, VSS, pt stable

## 2015-02-15 ENCOUNTER — Inpatient Hospital Stay (HOSPITAL_COMMUNITY): Payer: Medicare Other

## 2015-02-15 LAB — CBC
HEMATOCRIT: 36.1 % (ref 36.0–46.0)
HEMOGLOBIN: 11.4 g/dL — AB (ref 12.0–15.0)
MCH: 28.6 pg (ref 26.0–34.0)
MCHC: 31.6 g/dL (ref 30.0–36.0)
MCV: 90.5 fL (ref 78.0–100.0)
Platelets: 197 10*3/uL (ref 150–400)
RBC: 3.99 MIL/uL (ref 3.87–5.11)
RDW: 13.4 % (ref 11.5–15.5)
WBC: 5.6 10*3/uL (ref 4.0–10.5)

## 2015-02-15 LAB — COMPREHENSIVE METABOLIC PANEL
ALBUMIN: 2.9 g/dL — AB (ref 3.5–5.0)
ALT: 24 U/L (ref 14–54)
ANION GAP: 9 (ref 5–15)
AST: 47 U/L — AB (ref 15–41)
Alkaline Phosphatase: 138 U/L — ABNORMAL HIGH (ref 38–126)
BILIRUBIN TOTAL: 1 mg/dL (ref 0.3–1.2)
BUN: 13 mg/dL (ref 6–20)
CHLORIDE: 108 mmol/L (ref 101–111)
CO2: 25 mmol/L (ref 22–32)
Calcium: 8.9 mg/dL (ref 8.9–10.3)
Creatinine, Ser: 1.26 mg/dL — ABNORMAL HIGH (ref 0.44–1.00)
GFR calc Af Amer: 44 mL/min — ABNORMAL LOW (ref >60)
GFR calc non Af Amer: 38 mL/min — ABNORMAL LOW (ref >60)
GLUCOSE: 143 mg/dL — AB (ref 65–99)
POTASSIUM: 3.9 mmol/L (ref 3.5–5.1)
Sodium: 142 mmol/L (ref 135–145)
TOTAL PROTEIN: 6 g/dL — AB (ref 6.5–8.1)

## 2015-02-15 LAB — GLUCOSE, CAPILLARY
GLUCOSE-CAPILLARY: 104 mg/dL — AB (ref 65–99)
GLUCOSE-CAPILLARY: 96 mg/dL (ref 65–99)
GLUCOSE-CAPILLARY: 93 mg/dL (ref 65–99)
Glucose-Capillary: 129 mg/dL — ABNORMAL HIGH (ref 65–99)
Glucose-Capillary: 107 mg/dL — ABNORMAL HIGH (ref 65–99)

## 2015-02-15 LAB — UREA NITROGEN, URINE: UREA NITROGEN UR: 140 mg/dL

## 2015-02-15 MED ORDER — TECHNETIUM TC 99M MEBROFENIN IV KIT
5.2000 | PACK | Freq: Once | INTRAVENOUS | Status: AC | PRN
  Administered 2015-02-15: 5 via INTRAVENOUS

## 2015-02-15 MED ORDER — SINCALIDE 5 MCG IJ SOLR
INTRAMUSCULAR | Status: AC
  Administered 2015-02-15: 1.6 ug via INTRAVENOUS
  Filled 2015-02-15: qty 5

## 2015-02-15 MED ORDER — LORAZEPAM 2 MG/ML IJ SOLN
1.0000 mg | Freq: Once | INTRAMUSCULAR | Status: AC
  Administered 2015-02-15: 1 mg via INTRAMUSCULAR
  Filled 2015-02-15: qty 1

## 2015-02-15 MED ORDER — SINCALIDE 5 MCG IJ SOLR
0.0200 ug/kg | Freq: Once | INTRAMUSCULAR | Status: AC
  Administered 2015-02-15: 1.6 ug via INTRAVENOUS
  Filled 2015-02-15: qty 5

## 2015-02-15 MED ORDER — LORAZEPAM 2 MG/ML IJ SOLN: 1.0000 mg | Freq: Once | INTRAMUSCULAR | Status: DC

## 2015-02-15 MED ORDER — SODIUM CHLORIDE 0.9 % IV SOLN
INTRAVENOUS | Status: AC
  Administered 2015-02-15: 12:00:00 via INTRAVENOUS

## 2015-02-15 NOTE — Progress Notes (Signed)
Patient Demographics:    [name], is a 81 y.o. female, DOB - 11-04-1929, [id number]  Admit date - 02/13/2015   Admitting Physician Norval Morton, MD  Outpatient Primary MD for the patient is Irven Shelling, MD  LOS - 2  Summary  81 year old Caucasian female with history of chronic diastolic CHF, dyslipidemia, history of paroxysmal atrial fibrillation not on anticoagulation at home who was recently admitted for possible cholecystitis and subsequently discharged on oral antibiotics was readmitted a few weeks later with sepsis due to possible cholecystitis and acute pancreatitis. GI general surgery on board. She has stabilized with IV fluids and empiric antibiotics. She is due to undergo HIDA scan with possible cholecystectomy. She may also require ERCP/EUS by GI.   Chief Complaint  Patient presents with  . Vomiting  . Abdominal Pain        Subjective:    [name] today has, No headache, No chest pain, No abdominal pain - No Nausea, No new weakness tingling or numbness, No Cough - SOB.     Assessment  & Plan :     1. Sepsis with Acute pancreatitis with suspicion for cholangitis/cholecystitis. Had similar presentation a few days ago, at that time she was seen by GI and general surgery and placed on empiric Zosyn. Imaging shows pancreatic head mass, liver enzymes are mildly elevated and suspicion for a retained CBD stone is low, lipid panel is stable.   She stable on empiric antibiotics which will be continued, so far cultures negative. Both general surgery and GI on board. Due for HIDA scan later today per general surgery. She may require ERCP/EUS with the possibility of laparoscopic cholecystectomy. Continue nothing by mouth and continue supportive care. Overall appears stable today. Sepsis  physiology seems to have resolved.   2. History of pancreatic cystic lesion. 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 MRCP stable recently. GI following.   3. Chronic diastolic CHF with EF [id number] on recent echogram. Currently compensated monitor closely. She is on 40 mg of daily Lasix orally at home which is on hold currently. Continue oral beta blocker.   4. Dyslipidemia. Resume statin once acute issues have resolved.   5. Essential hypertension. Continue oral beta blocker along with As needed hydralazine.   6. Dehydration with acute renal failure. IV fluids, hold ACE inhibitor and diuretics, repeat BMP in the morning.    7. Chronic stage II to 3 Right heel ulcer. Wound care.    8. History of paroxysmal atrial fibrillation. ChadVASC 2 score of greater than 3. Not noted to be on anticoagulation, goal will be rate controlled, continue beta blocker and monitor. Currently on full dose aspirin which will be continued.   9. DM type II. Currently on sliding scale.  CBG (last 3)   Recent Labs  02/14/15 2354 02/15/15 0317 02/15/15 0723  GLUCAP 99 129* 104*     Lab Results  Component Value Date   HGBA1C 7.9* 01/16/2015       Code Status : DNR  Family Communication  : Daughter bedside  Disposition Plan  : To be decided  Consults  :  GI and general surgery  Procedures  :   Right upper quadrant ultrasound  confirming pancreatic head mass  HIDA scan ordered by general surgery  DVT Prophylaxis  :    Heparin   Lab Results  Component Value Date   PLT 197 02/15/2015    Inpatient Medications  Scheduled Meds: . amLODipine  5 mg Oral Daily  . aspirin EC  325 mg Oral Daily  . cholecalciferol  2,000 Units Oral Daily  . cyanocobalamin  1,000 mcg Intramuscular Q30 days  . heparin  5,000 Units Subcutaneous 3 times per day  . insulin aspart  0-9 Units Subcutaneous 6 times per day  . lithium carbonate   150 mg Oral Q breakfast  . metoprolol  50 mg Oral BID  . mirtazapine  30 mg Oral QHS  . oxybutynin  5 mg Oral BID  . pantoprazole  40 mg Oral Daily  . piperacillin-tazobactam (ZOSYN)  IV  3.375 g Intravenous 3 times per day  . pregabalin  75 mg Oral BID  . Vilazodone HCl  20 mg Oral Daily  . zolpidem  2.5 mg Oral QHS   Continuous Infusions: . sodium chloride     PRN Meds:.acetaminophen, conjugated estrogens, fentaNYL (SUBLIMAZE) injection, hydrALAZINE, ipratropium-albuterol, nitroGLYCERIN, [DISCONTINUED] ondansetron **OR** ondansetron (ZOFRAN) IV  Antibiotics  :     Anti-infectives    Start     Dose/Rate Route Frequency Ordered Stop   02/14/15 1000  levofloxacin (LEVAQUIN) tablet 750 mg  Status:  Discontinued     750 mg Oral Every 48 hours 02/13/15 2323 02/14/15 0850   02/14/15 1000  piperacillin-tazobactam (ZOSYN) IVPB 3.375 g     3.375 g 12.5 mL/hr over 240 Minutes Intravenous 3 times per day 02/14/15 0911     02/14/15 0600  metroNIDAZOLE (FLAGYL) IVPB 500 mg  Status:  Discontinued     500 mg 100 mL/hr over 60 Minutes Intravenous Every 8 hours 02/14/15 0124 02/14/15 0850   02/13/15 2130  metroNIDAZOLE (FLAGYL) IVPB 500 mg     500 mg 100 mL/hr over 60 Minutes Intravenous  Once 02/13/15 2116 02/13/15 2348        Objective:   Filed Vitals:   02/14/15 2140 02/15/15 0500 02/15/15 0547 02/15/15 0600  BP: 135/79  178/47 149/47  Pulse: 62  79 74  Temp: 97.6 F (36.4 C)  98.2 F (36.8 C)   TempSrc: Oral  Axillary   Resp: 18  18   Height:      Weight:  79.742 kg (175 lb 12.8 oz)    SpO2: 95%  96%     Wt Readings from Last 3 Encounters:  02/15/15 79.742 kg (175 lb 12.8 oz)  02/08/15 76.8 kg (169 lb 5 oz)  01/17/15 75.8 kg (167 lb 1.7 oz)     Intake/Output Summary (Last 24 hours) at 02/15/15 1005 Last data filed at 02/15/15 0527  Gross per 24 hour  Intake 1833.75 ml  Output      0 ml  Net 1833.75 ml     Physical Exam  Awake Alert, Oriented X 2, No new F.N  deficits, Normal affect [place].AT,PERRAL Supple Neck,No JVD, No cervical lymphadenopathy appriciated.  Symmetrical Chest wall movement, Good air movement bilaterally, CTAB RRR,No Gallops,Rubs or new Murmurs, No Parasternal Heave +ve B.Sounds, Abd Soft, No tenderness, No organomegaly appriciated, No rebound - guarding or rigidity. No Cyanosis, Clubbing or edema, No new Rash or bruise       Data Review:   Micro Results No results found for this or any previous visit (from the past 240 hour(s)).  Radiology Reports  US Abdomen Complete  02/14/2015  CLINICAL DATA:  Pancreatitis EXAM: ULTRASOUND ABDOMEN COMPLETE COMPARISON:  MRI 02/05/2015.  CT 02/03/2015. FINDINGS: Gallbladder: No gallstones or wall thickening visualized. No sonographic Murphy sign noted. Common bile duct: Diameter: Dilated, 11 mm, similar to prior CT and MRI. Liver: No focal lesion identified. Within normal limits in parenchymal echogenicity. IVC: No abnormality visualized. Pancreas: 2.9 cm cystic area within the pancreatic head as seen on prior imaging. Spleen: Normal size.  No focal abnormality. Right Kidney: Length: 10.3 cm. Echogenicity within normal limits. No mass or hydronephrosis visualized. Left Kidney: Length: 11.0 cm. Echogenicity within normal limits. No mass or hydronephrosis visualized. Abdominal aorta: No aneurysm visualized. Other findings: None. IMPRESSION: 2.9 cm cystic mass in the pancreatic head as seen on prior imaging. Stable biliary ductal dilatation. Electronically Signed   By: Rolm Baptise M.D.   On: 02/14/2015 07:36          CBC  Recent Labs Lab 02/09/15 0544 02/13/15 1857 02/14/15 0805 02/15/15 0420  WBC 5.5 22.0* 11.8* 5.6  HGB 12.5 14.7 10.7* 11.4*  HCT 40.5 47.8* 34.9* 36.1  PLT 159 389 227 197  MCV 91.4 92.6 90.6 90.5  MCH 28.2 28.5 27.8 28.6  MCHC 30.9 30.8 30.7 31.6  RDW 12.7 13.3 13.5 13.4    Chemistries   Recent Labs Lab 02/09/15 0544 02/13/15 1857 02/14/15 0805 02/15/15 0420    NA 142 141 141 142  K 3.6 4.1 4.0 3.9  CL 98* 102 106 108  CO2 33* 25 25 25   GLUCOSE 150* 212* 108* 143*  BUN 8 15 18 13   CREATININE 1.08* 1.81* 1.44* 1.26*  CALCIUM 9.7 9.7 8.4* 8.9  AST 14* 85* 36 47*  ALT 30 28 20 24   ALKPHOS 118 226* 162* 138*  BILITOT 0.9 1.1 1.1 1.0   ------------------------------------------------------------------------------------------------------------------ estimated creatinine clearance is 34.9 mL/min (by C-G formula based on Cr of 1.26). ------------------------------------------------------------------------------------------------------------------ No results for input(s): HGBA1C in the last 72 hours. ------------------------------------------------------------------------------------------------------------------  Recent Labs  02/14/15 1002  CHOL 153  HDL 26*  LDLCALC 87  TRIG 200*  CHOLHDL 5.9   ------------------------------------------------------------------------------------------------------------------ No results for input(s): TSH, T4TOTAL, T3FREE, THYROIDAB in the last 72 hours.  Invalid input(s): FREET3 ------------------------------------------------------------------------------------------------------------------ No results for input(s): VITAMINB12, FOLATE, FERRITIN, TIBC, IRON, RETICCTPCT in the last 72 hours.  Coagulation profile No results for input(s): INR, PROTIME in the last 168 hours.  No results for input(s): DDIMER in the last 72 hours.  Cardiac Enzymes No results for input(s): CKMB, TROPONINI, MYOGLOBIN in the last 168 hours.  Invalid input(s): CK ------------------------------------------------------------------------------------------------------------------ Invalid input(s): POCBNP  Lab Results  Component Value Date   CHOL 153 02/14/2015   HDL 26* 02/14/2015   LDLCALC 87 02/14/2015   TRIG 200* 02/14/2015   CHOLHDL 5.9 02/14/2015    Time Spent in minutes   35   SINGH,PRASHANT K M.D on 02/15/2015 at  10:05 AM  Between 7am to 7pm - Pager - [phone]  After 7pm go to [email or web address] - password [facility]  [facility] -  Office  [phone]

## 2015-02-15 NOTE — Progress Notes (Signed)
[facility] Progress Note     Subjective: Pt doing better, much more understandable this am and not as flushed.  No N/V, has RUQ abdominal pain, a bit worse than yesterday.  Had a BM yesterday, having flatus.  Objective: Vital signs in last 24 hours: Temp:  [97.6 F (36.4 C)-101 F (38.3 C)] 98.2 F (36.8 C) (12/13 0547) Pulse Rate:  [59-82] 79 (12/13 0547) Resp:  [18] 18 (12/13 0547) BP: (120-178)/(31-79) 178/47 mmHg (12/13 0547) SpO2:  [95 %-100 %] 96 % (12/13 0547) Weight:  [79.742 kg (175 lb 12.8 oz)] 79.742 kg (175 lb 12.8 oz) (12/13 0500) Last BM Date: 02/14/15  Intake/Output from previous day: 12/12 0701 - 12/13 0700 In: 2073.8 [P.O.:240; I.V.:1683.8; IV Piggyback:150] Out: -  Intake/Output this shift:    PE: Gen:  Alert, NAD, pleasant Abd: Soft, ND, tender in RUQ, right side of abdomen and central abdomen, +BS, no HSM   Lab Results:   Recent Labs  02/14/15 0805 02/15/15 0420  WBC 11.8* 5.6  HGB 10.7* 11.4*  HCT 34.9* 36.1  PLT 227 197   BMET  Recent Labs  02/14/15 0805 02/15/15 0420  NA 141 142  K 4.0 3.9  CL 106 108  CO2 25 25  GLUCOSE 108* 143*  BUN 18 13  CREATININE 1.44* 1.26*  CALCIUM 8.4* 8.9   PT/INR No results for input(s): LABPROT, INR in the last 72 hours. CMP     Component Value Date/Time   NA 142 02/15/2015 0420   K 3.9 02/15/2015 0420   CL 108 02/15/2015 0420   CO2 25 02/15/2015 0420   GLUCOSE 143* 02/15/2015 0420   BUN 13 02/15/2015 0420   CREATININE 1.26* 02/15/2015 0420   CALCIUM 8.9 02/15/2015 0420   PROT 6.0* 02/15/2015 0420   ALBUMIN 2.9* 02/15/2015 0420   AST 47* 02/15/2015 0420   ALT 24 02/15/2015 0420   ALKPHOS 138* 02/15/2015 0420   BILITOT 1.0 02/15/2015 0420   GFRNONAA 38* 02/15/2015 0420   GFRAA 44* 02/15/2015 0420   Lipase     Component Value Date/Time   LIPASE 50 02/14/2015 0805       Studies/Results: US Abdomen Complete  02/14/2015  CLINICAL DATA:  Pancreatitis EXAM:  ULTRASOUND ABDOMEN COMPLETE COMPARISON:  MRI 02/05/2015.  CT 02/03/2015. FINDINGS: Gallbladder: No gallstones or wall thickening visualized. No sonographic Murphy sign noted. Common bile duct: Diameter: Dilated, 11 mm, similar to prior CT and MRI. Liver: No focal lesion identified. Within normal limits in parenchymal echogenicity. IVC: No abnormality visualized. Pancreas: 2.9 cm cystic area within the pancreatic head as seen on prior imaging. Spleen: Normal size.  No focal abnormality. Right Kidney: Length: 10.3 cm. Echogenicity within normal limits. No mass or hydronephrosis visualized. Left Kidney: Length: 11.0 cm. Echogenicity within normal limits. No mass or hydronephrosis visualized. Abdominal aorta: No aneurysm visualized. Other findings: None. IMPRESSION: 2.9 cm cystic mass in the pancreatic head as seen on prior imaging. Stable biliary ductal dilatation. Electronically Signed   By: Rolm Baptise M.D.   On: 02/14/2015 07:36    Anti-infectives: Anti-infectives    Start     Dose/Rate Route Frequency Ordered Stop   02/14/15 1000  levofloxacin (LEVAQUIN) tablet 750 mg  Status:  Discontinued     750 mg Oral Every 48 hours 02/13/15 2323 02/14/15 0850   02/14/15 1000  piperacillin-tazobactam (ZOSYN) IVPB 3.375 g     3.375 g 12.5 mL/hr over 240 Minutes Intravenous 3 times per day 02/14/15 0911  02/14/15 0600  metroNIDAZOLE (FLAGYL) IVPB 500 mg  Status:  Discontinued     500 mg 100 mL/hr over 60 Minutes Intravenous Every 8 hours 02/14/15 0124 02/14/15 0850   02/13/15 2130  metroNIDAZOLE (FLAGYL) IVPB 500 mg     500 mg 100 mL/hr over 60 Minutes Intravenous  Once 02/13/15 2116 02/13/15 2348       Assessment/Plan Pancreatic head cyst  RUQ abdominal pain CBD partial obstruction with biliary dilatation of undetermined etiology Biliary pancreatitis Elevated Alk phos and AST Leukocytosis - down to 5.6 -Recent admission from 02/03/15 - 02/09/15 -Pre-operative ERCP would be very helpful to define  anatomy and better evaluate her ducts prior to surgical intervention.  -Pancreatitis improved - lipase normalized to 50 -HIDA scan ordered due to tenderness in RUQ, if positive will discuss options for lap chole vs perc chole tube - no pain meds prior to HIDA    LOS: 2 days    Nat Christen 02/15/2015, 7:17 AM Pager: [phone]

## 2015-02-15 NOTE — Progress Notes (Signed)
Patient out of room getting HIDA scan. Discussed case with the surgical PA. Will discuss with Dr. Ninfa Linden potential imaging studies as pertaining to hepatobiliary anatomy including preoperative EUS, preoperative ERCP or cholecystectomy/cholecystotomy with intraoperative cholangiogram followed by ERCP if necessary.. EUS cannot be done until at least Thursday.

## 2015-02-16 DIAGNOSIS — K830 Cholangitis: Secondary | ICD-10-CM

## 2015-02-16 DIAGNOSIS — I4892 Unspecified atrial flutter: Secondary | ICD-10-CM

## 2015-02-16 DIAGNOSIS — N179 Acute kidney failure, unspecified: Secondary | ICD-10-CM

## 2015-02-16 DIAGNOSIS — N189 Chronic kidney disease, unspecified: Secondary | ICD-10-CM

## 2015-02-16 LAB — COMPREHENSIVE METABOLIC PANEL
ALT: 17 U/L (ref 14–54)
ANION GAP: 11 (ref 5–15)
AST: 29 U/L (ref 15–41)
Albumin: 2.7 g/dL — ABNORMAL LOW (ref 3.5–5.0)
Alkaline Phosphatase: 107 U/L (ref 38–126)
BUN: 7 mg/dL (ref 6–20)
CHLORIDE: 111 mmol/L (ref 101–111)
CO2: 22 mmol/L (ref 22–32)
Calcium: 9.1 mg/dL (ref 8.9–10.3)
Creatinine, Ser: 1.05 mg/dL — ABNORMAL HIGH (ref 0.44–1.00)
GFR, EST AFRICAN AMERICAN: 55 mL/min — AB (ref >60)
GFR, EST NON AFRICAN AMERICAN: 48 mL/min — AB (ref >60)
Glucose, Bld: 101 mg/dL — ABNORMAL HIGH (ref 65–99)
POTASSIUM: 4 mmol/L (ref 3.5–5.1)
Sodium: 144 mmol/L (ref 135–145)
TOTAL PROTEIN: 5.6 g/dL — AB (ref 6.5–8.1)
Total Bilirubin: 0.8 mg/dL (ref 0.3–1.2)

## 2015-02-16 LAB — GLUCOSE, CAPILLARY
GLUCOSE-CAPILLARY: 94 mg/dL (ref 65–99)
GLUCOSE-CAPILLARY: 208 mg/dL — AB (ref 65–99)
Glucose-Capillary: 87 mg/dL (ref 65–99)
Glucose-Capillary: 97 mg/dL (ref 65–99)

## 2015-02-16 LAB — CBC
HEMATOCRIT: 34.7 % — AB (ref 36.0–46.0)
Hemoglobin: 11.2 g/dL — ABNORMAL LOW (ref 12.0–15.0)
MCH: 28.6 pg (ref 26.0–34.0)
MCHC: 32.3 g/dL (ref 30.0–36.0)
MCV: 88.7 fL (ref 78.0–100.0)
Platelets: 143 10*3/uL — ABNORMAL LOW (ref 150–400)
RBC: 3.91 MIL/uL (ref 3.87–5.11)
RDW: 13.4 % (ref 11.5–15.5)
WBC: 4.7 10*3/uL (ref 4.0–10.5)

## 2015-02-16 LAB — MAGNESIUM: Magnesium: 1.9 mg/dL (ref 1.7–2.4)

## 2015-02-16 NOTE — Discharge Instructions (Signed)

## 2015-02-16 NOTE — Progress Notes (Signed)
Physical Therapy Treatment Patient Details Name: [name] MRN: [id number] DOB: 08-14-1929 Today's Date: 02/16/2015    History of Present Illness [name] is a 81 year old female with a past medical history significant for Htn, Hld, DM2, CKD stage III, TIA/stroke w/residual left-sided weakness and slurred speech; who presents with epigastric pain, nausea, and vomiting.    PT Comments    Pt much better today cognitively and from a mobility standpoint. Ambulated 100' with RW and min/ min-guard A. VSS on RA. Performed transfers with min-guard A. PT will continue to follow.   Follow Up Recommendations  Home health PT;Supervision/Assistance - 24 hour     Equipment Recommendations  None recommended by PT    Recommendations for Other Services       Precautions / Restrictions Precautions Precautions: Fall Restrictions Weight Bearing Restrictions: No    Mobility  Bed Mobility               General bed mobility comments: pt recieved in chair  Transfers Overall transfer level: Needs assistance Equipment used: Rolling walker (2 wheeled) Transfers: Sit to/from [facility] Sit to Stand: Min guard Stand pivot transfers: Min guard       General transfer comment: pt able to stand from recliner to RW and transfer to [facility] without RW both with min-guard A, no LOB  Ambulation/Gait Ambulation/Gait assistance: Min assist;Min guard Ambulation Distance (Feet): 100 Feet Assistive device: Rolling walker (2 wheeled) Gait Pattern/deviations: Step-through pattern;Shuffle;Trunk flexed Gait velocity: decreased Gait velocity interpretation: <1.8 ft/sec, indicative of risk for recurrent falls General Gait Details: vc's for upright posture. Min-guard A for first 75', then min A last 25' as pt fatigued and bilateral knees with increased flexion. Discussed keeping ambulation at room to room distance at home for safety and increasing as directed by HHPT   Stairs            Wheelchair Mobility    Modified Rankin (Stroke Patients Only)       Balance Overall balance assessment: Needs assistance Sitting-balance support: No upper extremity supported Sitting balance-Leahy Scale: Fair Sitting balance - Comments: correcting left lean today and able to sit in midline without UE support   Standing balance support: Single extremity supported Standing balance-Leahy Scale: Poor Standing balance comment: pt keeps one hand on stable surface with all standing                    Cognition Arousal/Alertness: Awake/alert Behavior During Therapy: Flat affect Overall Cognitive Status: Within Functional Limits for tasks assessed                      Exercises General Exercises - Lower Extremity Ankle Circles/Pumps: AROM;Both;10 reps;Seated Quad Sets: AROM;Both;10 reps;Seated Heel Slides: AROM;Both;10 reps;Seated Straight Leg Raises: AROM;Both;10 reps;Seated    General Comments General comments (skin integrity, edema, etc.): VSS      Pertinent Vitals/Pain Pain Assessment: No/denies pain    Home Living                      Prior Function            PT Goals (current goals can now be found in the care plan section) Acute Rehab PT Goals Patient Stated Goal: Go home soon PT Goal Formulation: With patient/family Time For Goal Achievement: 02/28/15 Potential to Achieve Goals: Good Progress towards PT goals: Progressing toward goals    Frequency  Min 3X/week    PT Plan Current plan  remains appropriate    Co-evaluation             End of Session Equipment Utilized During Treatment: Gait belt Activity Tolerance: Patient tolerated treatment well Patient left: with call bell/phone within reach;Other (comment) [facility])     Time: [id number] PT Time Calculation (min) (ACUTE ONLY): 25 min  Charges:  $Gait Training: 8-22 mins $Therapeutic Exercise: 8-22 mins                    G Codes:     [name], PT  Acute  Rehab Services  [phone]  [name], [name] 02/16/2015, 10:50 AM

## 2015-02-16 NOTE — Progress Notes (Signed)
[facility] Progress Note  Subjective: Pt without abdominal pain, wants to eat. Lipase, LFT's normal  Objective: Vital signs in last 24 hours: Temp:  [97.3 F (36.3 C)-98.4 F (36.9 C)] 98.4 F (36.9 C) (12/14 0518) Pulse Rate:  [63-67] 65 (12/14 0518) Resp:  [16-17] 16 (12/14 0518) BP: (150-157)/(48-50) 155/50 mmHg (12/14 0518) SpO2:  [95 %-100 %] 100 % (12/14 0518) Weight:  [75.751 kg (167 lb)] 75.751 kg (167 lb) (12/14 0500) Weight change: -3.992 kg (-8 lb 12.8 oz)   PE:not done  Lab Results: Results for orders placed or performed during the hospital encounter of 02/13/15 (from the past 24 hour(s))  Glucose, capillary     Status: None   Collection Time: 02/15/15 12:25 PM  Result Value Ref Range   Glucose-Capillary 96 65 - 99 mg/dL  Glucose, capillary     Status: Abnormal   Collection Time: 02/15/15  4:29 PM  Result Value Ref Range   Glucose-Capillary 107 (H) 65 - 99 mg/dL  Glucose, capillary     Status: None   Collection Time: 02/15/15  8:02 PM  Result Value Ref Range   Glucose-Capillary 93 65 - 99 mg/dL   Comment 1 Notify RN    Comment 2 Document in Chart   Glucose, capillary     Status: None   Collection Time: 02/15/15 11:56 PM  Result Value Ref Range   Glucose-Capillary 87 65 - 99 mg/dL   Comment 1 Notify RN    Comment 2 Document in Chart   Glucose, capillary     Status: None   Collection Time: 02/16/15  4:03 AM  Result Value Ref Range   Glucose-Capillary 94 65 - 99 mg/dL   Comment 1 Notify RN    Comment 2 Document in Chart   Comprehensive metabolic panel     Status: Abnormal   Collection Time: 02/16/15  6:13 AM  Result Value Ref Range   Sodium 144 135 - 145 mmol/L   Potassium 4.0 3.5 - 5.1 mmol/L   Chloride 111 101 - 111 mmol/L   CO2 22 22 - 32 mmol/L   Glucose, Bld 101 (H) 65 - 99 mg/dL   BUN 7 6 - 20 mg/dL   Creatinine, Ser 1.05 (H) 0.44 - 1.00 mg/dL   Calcium 9.1 8.9 - 10.3 mg/dL   Total Protein 5.6 (L) 6.5 - 8.1 g/dL   Albumin 2.7 (L)  3.5 - 5.0 g/dL   AST 29 15 - 41 U/L   ALT 17 14 - 54 U/L   Alkaline Phosphatase 107 38 - 126 U/L   Total Bilirubin 0.8 0.3 - 1.2 mg/dL   GFR calc non Af Amer 48 (L) >60 mL/min   GFR calc Af Amer 55 (L) >60 mL/min   Anion gap 11 5 - 15  CBC     Status: Abnormal   Collection Time: 02/16/15  6:13 AM  Result Value Ref Range   WBC 4.7 4.0 - 10.5 K/uL   RBC 3.91 3.87 - 5.11 MIL/uL   Hemoglobin 11.2 (L) 12.0 - 15.0 g/dL   HCT 34.7 (L) 36.0 - 46.0 %   MCV 88.7 78.0 - 100.0 fL   MCH 28.6 26.0 - 34.0 pg   MCHC 32.3 30.0 - 36.0 g/dL   RDW 13.4 11.5 - 15.5 %   Platelets 143 (L) 150 - 400 K/uL  Magnesium     Status: None   Collection Time: 02/16/15  6:13 AM  Result Value Ref Range   Magnesium 1.9  1.7 - 2.4 mg/dL  Glucose, capillary     Status: None   Collection Time: 02/16/15  7:20 AM  Result Value Ref Range   Glucose-Capillary 97 65 - 99 mg/dL    Studies/Results: Nm Hepatobiliary Including Gb  02/15/2015  CLINICAL DATA:  Right upper quadrant abdominal pain. EXAM: NUCLEAR MEDICINE HEPATOBILIARY IMAGING WITH GALLBLADDER EF TECHNIQUE: Sequential images of the abdomen were obtained out to 60 minutes following intravenous administration of radiopharmaceutical. After slow intravenous infusion of 1.6 micrograms Cholecystokinin, gallbladder ejection fraction was determined. RADIOPHARMACEUTICALS:  5.2 mCi Tc-5m Choletec IV COMPARISON:  Ultrasound of February 14, 2015. FINDINGS: Normal filling of gallbladder is noted. Normal filling of small bowel is noted gallbladder ejection fraction of 94.2% was measured without pain reported. At 45 min, normal ejection fraction is greater than 40%. IMPRESSION: Normal gallbladder ejection fraction after CCK administration. No abnormality seen on this study. Electronically Signed   By: Marijo Conception, M.D.   On: 02/15/2015 12:00      Assessment: 1. Pancreatitis, presumed passed CBD stone? No GS on CT, [place], and MRI. PIPIDA negative 2. Pancreatic head complex  cyst  Plan: 1. Discussed with patient, daughter and General surgery. Will allow diet today, plan EUS in next 2 days.     Jatziri Goffredo C 02/16/2015, 8:05 AM  Pager [phone] If no answer or after 5 PM call [phone]

## 2015-02-16 NOTE — Care Management Important Message (Signed)
Important Message  Patient Details  Name: JAMERIA JOU MRN: [id number] Date of Birth: [birth year]   Medicare Important Message Given:  Yes    Oaklyn Jakubek P Sudais Banghart 02/16/2015, 10:26 AM

## 2015-02-16 NOTE — Progress Notes (Signed)
Patient discharged to home with instructions. 

## 2015-02-16 NOTE — Discharge Summary (Signed)
Discharge Summary  [name] [id number] DOB: [birth year]  PCP: Irven Shelling, MD  Admit date: 02/13/2015 Discharge date: 02/16/2015  Time spent: 25 minutes   Recommendations for Outpatient Follow-up:  1. Patient will follow-up with Dr. Paulita Fujita, gastroenterology, to set up endoscopic ultrasound next week  Discharge Diagnoses:  Active Hospital Problems   Diagnosis Date Noted  . Pancreatitis, acute 02/13/2015  . Acute kidney injury superimposed on chronic kidney disease ([place]) 02/13/2015  . Cholangitis 02/03/2015  . Ulcer of right heel ([place]) 01/13/2015  . Atrial flutter, paroxysmal ([place]) 01/13/2015  . Hyperlipidemia 07/18/2014  . Type 2 diabetes mellitus with hyperglycemia ([place]) 07/18/2014    Resolved Hospital Problems   Diagnosis Date Noted Date Resolved  No resolved problems to display.    Discharge Condition: Improved, being discharged home   Diet recommendation: Carb modified, low-fat diet   Filed Weights   02/13/15 2331 02/15/15 0500 02/16/15 0500  Weight: 76.749 kg (169 lb 3.2 oz) 79.742 kg (175 lb 12.8 oz) 75.751 kg (167 lb)    History of present illness:  81 year female past oral history of chronic diastolic CHF and paroxysmal A. fib who had a recent admission for possible cholecystitis discharged on oral antibiotics readmitted a few weeks later with sepsis due to possible cholecystitis and acute pancreatitis. She was discharged home and then was readmitted on 12/11 for acute pancreatitis with a lipase of 1505 and secondary cholangitis plus acute kidney injury from dehydration.  Hospital Course:  Principal Problem:   Pancreatitis, acute: Patient made nothing by mouth with IV fluids, symptoms resolved. Given similar presentation, patient seen by GI and general surgery and placed on. Zosyn. There was suspicion for a possible common bile duct stone. It was felt best the patient needs endoscopic ultrasound. Patient was seen by Dr. Paulita Fujita of GI on 12/14 who is able  to perform the procedure, but patient would not be able to have this done until the following week due to anesthesia availability. Patient initially upset and daughter said that she may not follow up as outpatient. After extensive discussion with patient, her daughter and myself, felt we were able to convince her to schedule outpatient ultrasound next week Active Problems:   Type 2 diabetes mellitus with hyperglycemia ([place]): Blood sugar stable once patient by mouth   Hyperlipidemia: Statin resumed on discharge   Atrial flutter, paroxysmal ([facility]) chads 2 score of 4. Patient refuses anticoagulation. Rate controlled on beta blocker. On full dose aspirin   Ulcer of right heel ([place])   Cholangitis: Secondary to possible stone. As above.   Acute kidney injury superimposed on chronic kidney disease ([place]): IV fluids, resolved   Procedures:  None   Consultations:  GI  Gen. surgery   Discharge Exam: BP 155/50 mmHg  Pulse 64  Temp(Src) 98.8 F (37.1 C) (Oral)  Resp 16  Ht 5' 4.5" (1.638 m)  Wt 75.751 kg (167 lb)  BMI 28.23 kg/m2  SpO2 95%  General: Alert and oriented 3  Cardiovascular: Regular rate and rhythm, S1-S2  Respiratory: Clear to auscultation bilaterally   Discharge Instructions You were cared for by a hospitalist during your hospital stay. If you have any questions about your discharge medications or the care you received while you were in the hospital after you are discharged, you can call the unit and asked to speak with the hospitalist on call if the hospitalist that took care of you is not available. Once you are discharged, your primary care physician will handle any  further medical issues. Please note that NO REFILLS for any discharge medications will be authorized once you are discharged, as it is imperative that you return to your primary care physician (or establish a relationship with a primary care physician if you do not have one) for your aftercare needs so that they  can reassess your need for medications and monitor your lab values.  Discharge Instructions    Diet - low sodium heart healthy    Complete by:  As directed      Increase activity slowly    Complete by:  As directed             Medication List    TAKE these medications        acetaminophen 500 MG tablet  Commonly known as:  TYLENOL  Take 500 mg by mouth every 6 (six) hours as needed for mild pain.     amLODipine 5 MG tablet  Commonly known as:  NORVASC  Take 1 tablet (5 mg total) by mouth daily.     aspirin 325 MG EC tablet  Take 1 tablet (325 mg total) by mouth daily.     atorvastatin 10 MG tablet  Commonly known as:  LIPITOR  Take 5 mg by mouth daily.     cholecalciferol 1000 UNITS tablet  Commonly known as:  VITAMIN D  Take 2,000 Units by mouth daily.     conjugated estrogens vaginal cream  Commonly known as:  PREMARIN  Place 1 Applicatorful vaginally daily as needed (burning).     cyanocobalamin 1000 MCG/ML injection  Commonly known as:  (VITAMIN B-12)  Inject 1,000 mcg into the muscle every 30 (thirty) days.     furosemide 20 MG tablet  Commonly known as:  LASIX  Take 2 tablets (40 mg total) by mouth daily.     glimepiride 1 MG tablet  Commonly known as:  AMARYL  Take 2 tablets (2 mg total) by mouth every morning.     levofloxacin 750 MG tablet  Commonly known as:  LEVAQUIN  Take 1 tablet (750 mg total) by mouth every other day. For 9 days     lisinopril 20 MG tablet  Commonly known as:  PRINIVIL,ZESTRIL  Take 1 tablet (20 mg total) by mouth every evening.     lithium carbonate 150 MG capsule  Take 150 mg by mouth daily.     metoprolol 50 MG tablet  Commonly known as:  LOPRESSOR  Take 1 tablet (50 mg total) by mouth 2 (two) times daily.     mirtazapine 30 MG tablet  Commonly known as:  REMERON  Take 30 mg by mouth at bedtime.     nitroGLYCERIN 0.4 MG SL tablet  Commonly known as:  NITROSTAT  Place 1 tablet (0.4 mg total) under the tongue  every 5 (five) minutes as needed for chest pain.     omeprazole 40 MG capsule  Commonly known as:  PRILOSEC  Take 40 mg by mouth at bedtime.     oxybutynin 5 MG tablet  Commonly known as:  DITROPAN  Take 5 mg by mouth 2 (two) times daily.     pregabalin 75 MG capsule  Commonly known as:  LYRICA  Take 75 mg by mouth 2 (two) times daily.     traMADol 50 MG tablet  Commonly known as:  ULTRAM  Take 1 tablet (50 mg total) by mouth 2 (two) times daily as needed (pain).     Vilazodone HCl 20 MG Tabs  Take 20 mg by mouth daily. Viibryd     zolpidem 5 MG tablet  Commonly known as:  AMBIEN  Take 2.5 mg by mouth at bedtime.       Allergies  Allergen Reactions  . Codeine Other (See Comments)    slurred speech       Follow-up Information    Follow up with Landry Dyke, MD.   Specialty:  Gastroenterology   Why:  Call this week to set up endoscopic ultrasound for next week   Contact information:   [address] [place] [address] [phone]        The results of significant diagnostics from this hospitalization (including imaging, microbiology, ancillary and laboratory) are listed below for reference.    Significant Diagnostic Studies: Nm Hepatobiliary Including Gb  02/15/2015  CLINICAL DATA:  Right upper quadrant abdominal pain. EXAM: NUCLEAR MEDICINE HEPATOBILIARY IMAGING WITH GALLBLADDER EF TECHNIQUE: Sequential images of the abdomen were obtained out to 60 minutes following intravenous administration of radiopharmaceutical. After slow intravenous infusion of 1.6 micrograms Cholecystokinin, gallbladder ejection fraction was determined. RADIOPHARMACEUTICALS:  5.2 mCi Tc-4m Choletec IV COMPARISON:  Ultrasound of February 14, 2015. FINDINGS: Normal filling of gallbladder is noted. Normal filling of small bowel is noted gallbladder ejection fraction of 94.2% was measured without pain reported. At 45 min, normal ejection fraction is greater than 40%.  IMPRESSION: Normal gallbladder ejection fraction after CCK administration. No abnormality seen on this study. Electronically Signed   By: Marijo Conception, M.D.   On: 02/15/2015 12:00   US Abdomen Complete  02/14/2015  CLINICAL DATA:  Pancreatitis EXAM: ULTRASOUND ABDOMEN COMPLETE COMPARISON:  MRI 02/05/2015.  CT 02/03/2015. FINDINGS: Gallbladder: No gallstones or wall thickening visualized. No sonographic Murphy sign noted. Common bile duct: Diameter: Dilated, 11 mm, similar to prior CT and MRI. Liver: No focal lesion identified. Within normal limits in parenchymal echogenicity. IVC: No abnormality visualized. Pancreas: 2.9 cm cystic area within the pancreatic head as seen on prior imaging. Spleen: Normal size.  No focal abnormality. Right Kidney: Length: 10.3 cm. Echogenicity within normal limits. No mass or hydronephrosis visualized. Left Kidney: Length: 11.0 cm. Echogenicity within normal limits. No mass or hydronephrosis visualized. Abdominal aorta: No aneurysm visualized. Other findings: None. IMPRESSION: 2.9 cm cystic mass in the pancreatic head as seen on prior imaging. Stable biliary ductal dilatation. Electronically Signed   By: Rolm Baptise M.D.   On: 02/14/2015 07:36   Ct Abdomen Pelvis W Contrast  02/03/2015  CLINICAL DATA:  Fever, abdominal pain, abnormal LFTs EXAM: CT ABDOMEN AND PELVIS WITH CONTRAST TECHNIQUE: Multidetector CT imaging of the abdomen and pelvis was performed using the standard protocol following bolus administration of intravenous contrast. CONTRAST:  67mL OMNIPAQUE IOHEXOL 300 MG/ML  SOLN COMPARISON:  03/23/2010 FINDINGS: Lower chest:  Mild bibasilar chronic interstitial disease. Hepatobiliary: No focal hepatic mass. Intrahepatic biliary ductal dilatation. Dilated common bile duct to the level of the pancreatic head. Mild pericholecystic fluid or wall thickening. Pancreas: 2.8 X 2.4 cm cystic pancreatic head mass with a punctate mural calcification. Spleen: Normal.  Adrenals/Urinary Tract: Normal adrenal glands. 10 mm hypodense, fluid attenuating left renal mass most consistent with a cyst. Normal right kidney. No obstructive uropathy or urolithiasis. Unremarkable bladder. Stomach/Bowel: Duo dual diverticulum is noted. No bowel wall thickening or dilatation. Diverticulosis of the descending and sigmoid colon without evidence of diverticulitis. Rectal fecal impaction. No pneumatosis, pneumoperitoneum or portal venous gas. No abdominal or pelvic free fluid. Small fat containing umbilical  hernia. Vascular/Lymphatic: Normal caliber abdominal aorta with atherosclerosis. No abdominal or pelvic lymphadenopathy. Reproductive: Normal uterus.  No adnexal mass. Other: No fluid collection or hematoma. Musculoskeletal: No acute osseous abnormality. No aggressive lytic or sclerotic osseous lesion. Left hip arthroplasty. Degenerative disc disease throughout the lumbar spine with diffuse facet arthropathy. IMPRESSION: 1. No acute abdominal or pelvic pathology. 2. 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 3. Intrahepatic and extrahepatic biliary ductal dilatation with abrupt narrowing at the pancreatic head. No focal ampullary mass or choledocholithiasis is identified. 4. Mild pericholecystic fluid or wall thickening. This appearance can be seen with cholecystitis. If there is further clinical concern, recommend a right upper quadrant ultrasound. Electronically Signed   By: Kathreen Devoid   On: 02/03/2015 17:03   Mr 3d Recon At Scanner  02/05/2015  CLINICAL DATA:  Cholangitis with suspected past common duct stone. Cystic pancreatic head lesion. EXAM: MRI ABDOMEN WITHOUT CONTRAST  (INCLUDING MRCP) TECHNIQUE: Multiplanar multisequence MR imaging of the abdomen was performed. Heavily T2-weighted images of the biliary and pancreatic ducts were obtained, and three-dimensional MRCP images were  rendered by post processing. COMPARISON:  CT 02/03/2015 and 03/23/2010. FINDINGS: Lower chest: Cardiomegaly and mild atelectasis at both lung bases, similar to recent CT. No significant pleural effusion. Hepatobiliary: The thin section MRCP images are motion degraded. No focal hepatic abnormalities are identified. As demonstrated on recent CT, there is intrahepatic and extrahepatic biliary dilatation. The common bile duct measures up to 1.5 cm in diameter. No intraductal calculi are demonstrated. There is mild gallbladder wall thickening and pericholecystic fluid without discrete gallstones. Pancreas: The pancreatic duct is normal in caliber. There is a cystic lesion within the pancreatic head, measuring 2.8 x 2.4 cm. This contains dependent intrinsic T1 shortening, but otherwise appears simple. This has only minimally enlarged from 2012 and is likely a benign finding. There is an adjacent periampullary duodenal diverticulum. No surrounding inflammatory changes. Spleen: Normal in size without focal abnormality. Adrenals/Urinary Tract: Both adrenal glands appear normal. Both kidneys demonstrate mild cortical thinning and perinephric soft tissue stranding. There are small left renal cysts. No hydronephrosis. Stomach/Bowel: No evidence of bowel wall thickening, distention or surrounding inflammatory change.Minimal ascites and mesenteric edema. Vascular/Lymphatic: There are no enlarged abdominal lymph nodes. Aortoiliac atherosclerosis better demonstrated on CT. Duplication of the IVC. Other: None. Musculoskeletal: Multilevel spondylosis and scoliosis noted. No acute osseous findings. IMPRESSION: 1. Persistent biliary dilatation of undetermined etiology. No evidence of choledocholithiasis. Of note, this biliary dilatation appears chronic and not grossly changed from 2012. 2. Gallbladder wall thickening and pericholecystic fluid may reflect cholecystitis in the appropriate clinical context. 3. Cystic lesion in the  pancreatic head demonstrates no aggressive characteristics and is only mildly enlarged from 2012, likely a benign finding. Electronically Signed   By: Richardean Sale M.D.   On: 02/05/2015 17:32   Dg Chest Port 1 View  02/06/2015  CLINICAL DATA:  Dyspnea and mid sternal chest pain. EXAM: PORTABLE CHEST 1 VIEW COMPARISON:  02/03/2015 FINDINGS: Lung volumes are extremely low bilaterally. Despite low volumes, a degree of interstitial edema is suspected. There may be small bilateral pleural effusions. The heart size is stable. IMPRESSION: Low lung volumes with suspicion of interstitial edema. There may be small bilateral pleural effusions. Electronically Signed   By: Aletta Edouard M.D.   On: 02/06/2015 09:00   Dg Chest Portable 1 View  02/03/2015  CLINICAL  DATA:  Fever EXAM: PORTABLE CHEST 1 VIEW COMPARISON:  01/14/2015 FINDINGS: Heart size mildly enlarged. Mild vascular congestion. Negative for edema or effusion. Prominent lung markings are similar to prior studies consistent with mild scarring. IMPRESSION: Mild congestive heart failure without significant edema or effusion. No definite pneumonia. Electronically Signed   By: Franchot Gallo M.D.   On: 02/03/2015 14:18   Mr Lambert Mody Cm/mrcp  02/05/2015  CLINICAL DATA:  Cholangitis with suspected past common duct stone. Cystic pancreatic head lesion. EXAM: MRI ABDOMEN WITHOUT CONTRAST  (INCLUDING MRCP) TECHNIQUE: Multiplanar multisequence MR imaging of the abdomen was performed. Heavily T2-weighted images of the biliary and pancreatic ducts were obtained, and three-dimensional MRCP images were rendered by post processing. COMPARISON:  CT 02/03/2015 and 03/23/2010. FINDINGS: Lower chest: Cardiomegaly and mild atelectasis at both lung bases, similar to recent CT. No significant pleural effusion. Hepatobiliary: The thin section MRCP images are motion degraded. No focal hepatic abnormalities are identified. As demonstrated on recent CT, there is intrahepatic and  extrahepatic biliary dilatation. The common bile duct measures up to 1.5 cm in diameter. No intraductal calculi are demonstrated. There is mild gallbladder wall thickening and pericholecystic fluid without discrete gallstones. Pancreas: The pancreatic duct is normal in caliber. There is a cystic lesion within the pancreatic head, measuring 2.8 x 2.4 cm. This contains dependent intrinsic T1 shortening, but otherwise appears simple. This has only minimally enlarged from 2012 and is likely a benign finding. There is an adjacent periampullary duodenal diverticulum. No surrounding inflammatory changes. Spleen: Normal in size without focal abnormality. Adrenals/Urinary Tract: Both adrenal glands appear normal. Both kidneys demonstrate mild cortical thinning and perinephric soft tissue stranding. There are small left renal cysts. No hydronephrosis. Stomach/Bowel: No evidence of bowel wall thickening, distention or surrounding inflammatory change.Minimal ascites and mesenteric edema. Vascular/Lymphatic: There are no enlarged abdominal lymph nodes. Aortoiliac atherosclerosis better demonstrated on CT. Duplication of the IVC. Other: None. Musculoskeletal: Multilevel spondylosis and scoliosis noted. No acute osseous findings. IMPRESSION: 1. Persistent biliary dilatation of undetermined etiology. No evidence of choledocholithiasis. Of note, this biliary dilatation appears chronic and not grossly changed from 2012. 2. Gallbladder wall thickening and pericholecystic fluid may reflect cholecystitis in the appropriate clinical context. 3. Cystic lesion in the pancreatic head demonstrates no aggressive characteristics and is only mildly enlarged from 2012, likely a benign finding. Electronically Signed   By: Richardean Sale M.D.   On: 02/05/2015 17:32    Microbiology: Recent Results (from the past 240 hour(s))  Blood culture (routine x 2)     Status: None (Preliminary result)   Collection Time: 02/13/15 11:40 PM  Result Value  Ref Range Status   Specimen Description BLOOD LEFT HAND  Final   Special Requests IN PEDIATRIC BOTTLE 1CC  Final   Culture NO GROWTH 2 DAYS  Final   Report Status PENDING  Incomplete  Blood culture (routine x 2)     Status: None (Preliminary result)   Collection Time: 02/14/15 12:00 AM  Result Value Ref Range Status   Specimen Description BLOOD LEFT ANTECUBITAL  Final   Special Requests IN PEDIATRIC BOTTLE 3CC  Final   Culture NO GROWTH 2 DAYS  Final   Report Status PENDING  Incomplete     Labs: Basic Metabolic Panel:  Recent Labs Lab 02/13/15 1857 02/14/15 0805 02/15/15 0420 02/16/15 0613  NA 141 141 142 144  K 4.1 4.0 3.9 4.0  CL 102 106 108 111  CO2 25 25 25 22   GLUCOSE 212* 108*  143* 101*  BUN 15 18 13 7   CREATININE 1.81* 1.44* 1.26* 1.05*  CALCIUM 9.7 8.4* 8.9 9.1  MG  --   --   --  1.9   Liver Function Tests:  Recent Labs Lab 02/13/15 1857 02/14/15 0805 02/15/15 0420 02/16/15 0613  AST 85* 36 47* 29  ALT 28 20 24 17   ALKPHOS 226* 162* 138* 107  BILITOT 1.1 1.1 1.0 0.8  PROT 7.2 5.3* 6.0* 5.6*  ALBUMIN 3.5 2.6* 2.9* 2.7*    Recent Labs Lab 02/13/15 1857 02/14/15 0805  LIPASE 1505* 50   No results for input(s): AMMONIA in the last 168 hours. CBC:  Recent Labs Lab 02/13/15 1857 02/14/15 0805 02/15/15 0420 02/16/15 0613  WBC 22.0* 11.8* 5.6 4.7  HGB 14.7 10.7* 11.4* 11.2*  HCT 47.8* 34.9* 36.1 34.7*  MCV 92.6 90.6 90.5 88.7  PLT 389 227 197 143*   Cardiac Enzymes: No results for input(s): CKTOTAL, CKMB, CKMBINDEX, TROPONINI in the last 168 hours. BNP: BNP (last 3 results)  Recent Labs  07/18/14 1216 01/13/15 0926 01/14/15 1040  BNP 250.6* 173.8* 45.0    ProBNP (last 3 results) No results for input(s): PROBNP in the last 8760 hours.  CBG:  Recent Labs Lab 02/15/15 2002 02/15/15 2356 02/16/15 0403 02/16/15 0720 02/16/15 1121  GLUCAP 93 87 94 97 208*       Signed:  Yitta Gongaware K  [facility] 02/16/2015,  5:54 PM

## 2015-02-16 NOTE — Progress Notes (Signed)
Patient ID: [name], female   DOB: 1929-06-11, 81 y.o.   MRN: [id number]    Subjective: Pt feels great today.  Remembers me from last week.  Has no pain at all and is hungry and wants to eat.  Objective: Vital signs in last 24 hours: Temp:  [97.3 F (36.3 C)-98.4 F (36.9 C)] 98.4 F (36.9 C) (12/14 0518) Pulse Rate:  [63-67] 65 (12/14 0518) Resp:  [16-17] 16 (12/14 0518) BP: (150-157)/(48-50) 155/50 mmHg (12/14 0518) SpO2:  [95 %-100 %] 100 % (12/14 0518) Weight:  [75.751 kg (167 lb)] 75.751 kg (167 lb) (12/14 0500) Last BM Date: 02/15/15  Intake/Output from previous day: 12/13 0701 - 12/14 0700 In: 940 [P.O.:140; I.V.:750; IV Piggyback:50] Out: 2050 [Urine:2050] Intake/Output this shift:    PE: Abd: soft, NT, Nd, +BS  Lab Results:   Recent Labs  02/15/15 0420 02/16/15 0613  WBC 5.6 4.7  HGB 11.4* 11.2*  HCT 36.1 34.7*  PLT 197 143*   BMET  Recent Labs  02/15/15 0420 02/16/15 0613  NA 142 144  K 3.9 4.0  CL 108 111  CO2 25 22  GLUCOSE 143* 101*  BUN 13 7  CREATININE 1.26* 1.05*  CALCIUM 8.9 9.1   PT/INR No results for input(s): LABPROT, INR in the last 72 hours. CMP     Component Value Date/Time   NA 144 02/16/2015 0613   K 4.0 02/16/2015 0613   CL 111 02/16/2015 0613   CO2 22 02/16/2015 0613   GLUCOSE 101* 02/16/2015 0613   BUN 7 02/16/2015 0613   CREATININE 1.05* 02/16/2015 0613   CALCIUM 9.1 02/16/2015 0613   PROT 5.6* 02/16/2015 0613   ALBUMIN 2.7* 02/16/2015 0613   AST 29 02/16/2015 0613   ALT 17 02/16/2015 0613   ALKPHOS 107 02/16/2015 0613   BILITOT 0.8 02/16/2015 0613   GFRNONAA 48* 02/16/2015 0613   GFRAA 55* 02/16/2015 0613   Lipase     Component Value Date/Time   LIPASE 50 02/14/2015 0805       Studies/Results: Nm Hepatobiliary Including Gb  02/15/2015  CLINICAL DATA:  Right upper quadrant abdominal pain. EXAM: NUCLEAR MEDICINE HEPATOBILIARY IMAGING WITH GALLBLADDER EF TECHNIQUE: Sequential images of the abdomen were  obtained out to 60 minutes following intravenous administration of radiopharmaceutical. After slow intravenous infusion of 1.6 micrograms Cholecystokinin, gallbladder ejection fraction was determined. RADIOPHARMACEUTICALS:  5.2 mCi Tc-31mCholetec IV COMPARISON:  Ultrasound of February 14, 2015. FINDINGS: Normal filling of gallbladder is noted. Normal filling of small bowel is noted gallbladder ejection fraction of 94.2% was measured without pain reported. At 45 min, normal ejection fraction is greater than 40%. IMPRESSION: Normal gallbladder ejection fraction after CCK administration. No abnormality seen on this study. Electronically Signed   By: JMarijo Conception M.D.   On: 02/15/2015 12:00    Anti-infectives: Anti-infectives    Start     Dose/Rate Route Frequency Ordered Stop   02/14/15 1000  levofloxacin (LEVAQUIN) tablet 750 mg  Status:  Discontinued     750 mg Oral Every 48 hours 02/13/15 2323 02/14/15 0850   02/14/15 1000  piperacillin-tazobactam (ZOSYN) IVPB 3.375 g     3.375 g 12.5 mL/hr over 240 Minutes Intravenous 3 times per day 02/14/15 0911     02/14/15 0600  metroNIDAZOLE (FLAGYL) IVPB 500 mg  Status:  Discontinued     500 mg 100 mL/hr over 60 Minutes Intravenous Every 8 hours 02/14/15 0124 02/14/15 0850   02/13/15 2130  metroNIDAZOLE (FLAGYL)  IVPB 500 mg     500 mg 100 mL/hr over 60 Minutes Intravenous  Once 02/13/15 2116 02/13/15 2348       Assessment/Plan   Pancreatic head cyst  RUQ abdominal pain, resolved CBD partial obstruction with biliary dilatation of undetermined etiology Pancreatitis, resolved Elevated Alk phos and AST, resolved -patient feels great today.  Abdominal pain from yesterday resolved.  -her US shows no evidence of cholecystitis, no gallstones, and a negative HIDA scan.  There are currently no indications for a cholecystectomy.  She did have pancreatitis on admission, but given she has had multiple [place] that do not reveal gallstones, it is difficult to  say this is biliary pancreatitis in origin. -will defer further work up to GI   LOS: 3 days    [name] E 02/16/2015, 8:25 AM Pager: [phone]

## 2015-02-19 LAB — CULTURE, BLOOD (ROUTINE X 2)
Culture: NO GROWTH
Culture: NO GROWTH

## 2015-02-21 ENCOUNTER — Encounter (HOSPITAL_COMMUNITY): Payer: Self-pay | Admitting: *Deleted

## 2015-02-22 ENCOUNTER — Other Ambulatory Visit: Payer: Self-pay | Admitting: Gastroenterology

## 2015-02-22 NOTE — Addendum Note (Signed)
Addended by: Arta Silence on: 02/22/2015 01:13 PM   Modules accepted: Orders

## 2015-02-23 ENCOUNTER — Ambulatory Visit (HOSPITAL_COMMUNITY): Payer: Medicare Other | Admitting: Anesthesiology

## 2015-02-23 ENCOUNTER — Ambulatory Visit (HOSPITAL_COMMUNITY)
Admission: RE | Admit: 2015-02-23 | Discharge: 2015-02-23 | Disposition: A | Discharge status: Home / Self Care | Payer: Medicare Other | Source: Ambulatory Visit | Attending: Gastroenterology | Admitting: Gastroenterology

## 2015-02-23 ENCOUNTER — Encounter (HOSPITAL_COMMUNITY): Payer: Self-pay | Admitting: Anesthesiology

## 2015-02-23 ENCOUNTER — Encounter (HOSPITAL_COMMUNITY)
Admission: RE | Disposition: A | Discharge status: Home / Self Care | Payer: Self-pay | Source: Ambulatory Visit | Attending: Gastroenterology

## 2015-02-23 DIAGNOSIS — I130 Hypertensive heart and chronic kidney disease with heart failure and stage 1 through stage 4 chronic kidney disease, or unspecified chronic kidney disease: Secondary | ICD-10-CM | POA: Insufficient documentation

## 2015-02-23 DIAGNOSIS — K219 Gastro-esophageal reflux disease without esophagitis: Secondary | ICD-10-CM | POA: Insufficient documentation

## 2015-02-23 DIAGNOSIS — N183 Chronic kidney disease, stage 3 (moderate): Secondary | ICD-10-CM | POA: Insufficient documentation

## 2015-02-23 DIAGNOSIS — Z79899 Other long term (current) drug therapy: Secondary | ICD-10-CM | POA: Insufficient documentation

## 2015-02-23 DIAGNOSIS — Z96651 Presence of right artificial knee joint: Secondary | ICD-10-CM | POA: Insufficient documentation

## 2015-02-23 DIAGNOSIS — Z96642 Presence of left artificial hip joint: Secondary | ICD-10-CM | POA: Diagnosis not present

## 2015-02-23 DIAGNOSIS — I509 Heart failure, unspecified: Secondary | ICD-10-CM | POA: Insufficient documentation

## 2015-02-23 DIAGNOSIS — Z7982 Long term (current) use of aspirin: Secondary | ICD-10-CM | POA: Diagnosis not present

## 2015-02-23 DIAGNOSIS — E785 Hyperlipidemia, unspecified: Secondary | ICD-10-CM | POA: Diagnosis not present

## 2015-02-23 DIAGNOSIS — K862 Cyst of pancreas: Secondary | ICD-10-CM | POA: Insufficient documentation

## 2015-02-23 DIAGNOSIS — Z7984 Long term (current) use of oral hypoglycemic drugs: Secondary | ICD-10-CM | POA: Diagnosis not present

## 2015-02-23 DIAGNOSIS — E1140 Type 2 diabetes mellitus with diabetic neuropathy, unspecified: Secondary | ICD-10-CM | POA: Diagnosis not present

## 2015-02-23 DIAGNOSIS — R1010 Upper abdominal pain, unspecified: Secondary | ICD-10-CM | POA: Diagnosis not present

## 2015-02-23 DIAGNOSIS — G709 Myoneural disorder, unspecified: Secondary | ICD-10-CM | POA: Insufficient documentation

## 2015-02-23 DIAGNOSIS — Z8719 Personal history of other diseases of the digestive system: Secondary | ICD-10-CM | POA: Diagnosis not present

## 2015-02-23 DIAGNOSIS — E1122 Type 2 diabetes mellitus with diabetic chronic kidney disease: Secondary | ICD-10-CM | POA: Insufficient documentation

## 2015-02-23 DIAGNOSIS — Z8673 Personal history of transient ischemic attack (TIA), and cerebral infarction without residual deficits: Secondary | ICD-10-CM | POA: Diagnosis not present

## 2015-02-23 HISTORY — PX: EUS: SHX5427

## 2015-02-23 SURGERY — ESOPHAGEAL ENDOSCOPIC ULTRASOUND (EUS) RADIAL
Anesthesia: Monitor Anesthesia Care

## 2015-02-23 MED ORDER — LACTATED RINGERS IV SOLN: INTRAVENOUS | Status: DC

## 2015-02-23 MED ORDER — CIPROFLOXACIN HCL 500 MG PO TABS: 500.0000 mg | ORAL_TABLET | Freq: Two times a day (BID) | ORAL | Status: AC

## 2015-02-23 MED ORDER — BUTAMBEN-TETRACAINE-BENZOCAINE 2-2-14 % EX AERO
INHALATION_SPRAY | CUTANEOUS | Status: DC | PRN
  Administered 2015-02-23: 1 via TOPICAL

## 2015-02-23 MED ORDER — SODIUM CHLORIDE 0.9 % IV SOLN
INTRAVENOUS | Status: DC
  Administered 2015-02-23: 1000 mL via INTRAVENOUS

## 2015-02-23 MED ORDER — PROPOFOL 10 MG/ML IV BOLUS
INTRAVENOUS | Status: DC | PRN
  Administered 2015-02-23 (×8): 20 mg via INTRAVENOUS

## 2015-02-23 MED ORDER — METRONIDAZOLE 500 MG PO TABS: 500.0000 mg | ORAL_TABLET | Freq: Three times a day (TID) | ORAL | Status: AC

## 2015-02-23 NOTE — Transfer of Care (Signed)
Immediate Anesthesia Transfer of Care Note  Patient: [name]  Procedure(s) Performed: Procedure(s): ESOPHAGEAL ENDOSCOPIC ULTRASOUND (EUS) RADIAL (N/A)  Patient Location: PACU  Anesthesia Type:MAC  Level of Consciousness: sedated  Airway & Oxygen Therapy: Patient Spontanous Breathing and Patient connected to nasal cannula oxygen  Post-op Assessment: Report given to RN and Post -op Vital signs reviewed and stable  Post vital signs: Reviewed and stable  Last Vitals:  Filed Vitals:   02/23/15 1001  BP: 155/48  Pulse: 67  Temp: 36.6 C  Resp: 17    Complications: No apparent anesthesia complications

## 2015-02-23 NOTE — Anesthesia Preprocedure Evaluation (Addendum)
Anesthesia Evaluation  Patient identified by MRN, date of birth, ID band Patient awake    Reviewed: Allergy & Precautions, NPO status , Patient's Chart, lab work & pertinent test results  Airway Mallampati: II  TM Distance: >3 FB Neck ROM: Full    Dental no notable dental hx.    Pulmonary neg pulmonary ROS,    Pulmonary exam normal breath sounds clear to auscultation       Cardiovascular hypertension, Pt. on medications and Pt. on home beta blockers +CHF  Normal cardiovascular exam Rhythm:Regular Rate:Normal  Study Conclusions  - Left ventricle: The cavity size was normal. Wall thickness was increased in a pattern of moderate LVH. The estimated ejection fraction was 60%. Wall motion was normal; there were no regional wall motion abnormalities. - Aortic valve: Sclerosis without stenosis. There was no significant regurgitation. - Right ventricle: The cavity size was normal. Systolic function was normal. - Pulmonary arteries: PA peak pressure: 39 mm Hg (S). - Pericardium, extracardiac: A trivial pericardial effusion was identified posterior to the heart.     Neuro/Psych PSYCHIATRIC DISORDERS TIA Neuromuscular disease    GI/Hepatic Neg liver ROS, GERD  ,  Endo/Other  diabetes, Type 2, Oral Hypoglycemic Agents  Renal/GU Renal disease  negative genitourinary   Musculoskeletal negative musculoskeletal ROS (+)   Abdominal   Peds negative pediatric ROS (+)  Hematology  (+) anemia ,   Anesthesia Other Findings   Reproductive/Obstetrics negative OB ROS                           Anesthesia Physical Anesthesia Plan  ASA: III  Anesthesia Plan: MAC   Post-op Pain Management:    Induction: Intravenous  Airway Management Planned: Natural Airway  Additional Equipment:   Intra-op Plan:   Post-operative Plan:   Informed Consent: I have reviewed the patients History and  Physical, chart, labs and discussed the procedure including the risks, benefits and alternatives for the proposed anesthesia with the patient or authorized representative who has indicated his/her understanding and acceptance.   Dental advisory given  Plan Discussed with: CRNA  Anesthesia Plan Comments:        Anesthesia Quick Evaluation

## 2015-02-23 NOTE — Op Note (Signed)
[facility] [place], [id number]   ENDOSCOPIC ULTRASOUND PROCEDURE REPORT  PATIENT: [name], [name]  MR#: [id number] BIRTHDATE: 04-21-1929  GENDER: female ENDOSCOPIST: Arta Silence, MD REFERRED BY:  Lavone Orn, M.D. PROCEDURE DATE:  02/23/2015 PROCEDURE:   Upper EUS ASA CLASS:      Class III INDICATIONS:   1.  upper abdominal pain, history pancreatitis, history elevated liver enzymes. MEDICATIONS: Monitored anesthesia care  DESCRIPTION OF PROCEDURE:   After the risks benefits and alternatives of the procedure were  explained, informed consent was obtained. The patient was then placed in the left, lateral, decubitus postion and IV sedation was administered. Throughout the procedure, the patients blood pressure, pulse and oxygen saturations were monitored continuously.  Under direct visualization, the EUS scope  endoscope was introduced through the mouth  and advanced to the second portion of the duodenum      . Water was used as necessary to provide an acoustic interface. Estimated blood loss is zero unless otherwise noted in this procedure report. Upon completion of the imaging, water was removed and the patient was sent to the recovery room in satisfactory condition.    FINDINGS:      Unilocular cyst with mural calcification seen at interface of head/neck of pancreas; cyst about 28 mm x 24 mm in size, and may communicate with the pancreatic duct which is otherwise normal-appearing and non-dilated.  No mural nodularity, perilesional adenopathy, or solid component to cyst was identified. 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  Bile duct maximally about 51mm in size but was otherwise normal; no bile duct wall thickening noted, no bile duct stone seen.  Gallbladder well-visualized and appears normal; no gallbladder wall thickening, sludge or gallstones were seen.  IMPRESSION:     As above.  Patient's prior  history of pancreatitis, with recent elevation of liver enzymes, is suggestive of gallbladder etiology, but gallbladder looks normal on today's exam and no sludge/stones/wall thickening was identified.  There is no obvious explanation for patient's ongoing abdominal pain, but diverticulitis from her periampullary diverticulum is a consideration.  No bile duct stone was seen.  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  RECOMMENDATIONS:     1.  Watch for potential complications of procedure. 2.  Ideally, one might consider surgical referral for consideration of cholecystectomy; however, patient is at very high risk for operative complications, thus will hold off on surgical referral unless she develops overt cholecystitis. 3.  Course of ciprofloxacin and metronidazole for possible periampullary diverticulitis. 4.  Follow-up with [facility] in a few weeks.   _______________________________ Lorrin MaisArta Silence, MD 02/23/2015 12:11 PM   CC:

## 2015-02-23 NOTE — Discharge Instructions (Signed)
Endoscopic Ultrasound  Care After Please read the instructions outlined below and refer to this sheet in the next few weeks. These discharge instructions provide you with general information on caring for yourself after you leave the hospital. Your doctor may also give you specific instructions. While your treatment has been planned according to the most current medical practices available, unavoidable complications occasionally occur. If you have any problems or questions after discharge, please call Dr. Paulita Fujita [facility]) at [phone].  HOME CARE INSTRUCTIONS Activity  You may resume your regular activity but move at a slower pace for the next 24 hours.   Take frequent rest periods for the next 24 hours.   Walking will help expel (get rid of) the air and reduce the bloated feeling in your abdomen.   No driving for 24 hours (because of the anesthesia (medicine) used during the test).   You may shower.   Do not sign any important legal documents or operate any machinery for 24 hours (because of the anesthesia used during the test).  Nutrition  Drink plenty of fluids.   You may resume your normal diet.   Begin with a light meal and progress to your normal diet.   Avoid alcoholic beverages for 24 hours or as instructed by your caregiver.  Medications You may resume your normal medications unless your caregiver tells you otherwise. What you can expect today  You may experience abdominal discomfort such as a feeling of fullness or "gas" pains.   You may experience a sore throat for 2 to 3 days. This is normal. Gargling with salt water may help this.    SEEK IMMEDIATE MEDICAL CARE IF:  You have excessive nausea (feeling sick to your stomach) and/or vomiting.   You have severe abdominal pain and distention (swelling).   You have trouble swallowing.   You have a temperature over 100 F (37.8 C).   You have rectal bleeding or vomiting of blood.  Document  Released: 10/04/2003 Document Revised: 11/01/2010 Document Reviewed: 04/16/2007 [facility] Patient Information 2012 [place].  Moderate Conscious Sedation, Adult, Care After Refer to this sheet in the next few weeks. These instructions provide you with information on caring for yourself after your procedure. Your health care provider may also give you more specific instructions. Your treatment has been planned according to current medical practices, but problems sometimes occur. Call your health care provider if you have any problems or questions after your procedure. WHAT TO EXPECT AFTER THE PROCEDURE  After your procedure:  You may feel sleepy, clumsy, and have poor balance for several hours.  Vomiting may occur if you eat too soon after the procedure. HOME CARE INSTRUCTIONS  Do not participate in any activities where you could become injured for at least 24 hours. Do not:  Drive.  Swim.  Ride a bicycle.  Operate heavy machinery.  Cook.  Use power tools.  Climb ladders.  Work from a high place.  Do not make important decisions or sign legal documents until you are improved.  If you vomit, drink water, juice, or soup when you can drink without vomiting. Make sure you have little or no nausea before eating solid foods.  Only take over-the-counter or prescription medicines for pain, discomfort, or fever as directed by your health care provider.  Make sure you and your family fully understand everything about the medicines given to you, including what side effects may occur.  You should not drink alcohol, take sleeping pills, or take medicines that  cause drowsiness for at least 24 hours.  If you smoke, do not smoke without supervision.  If you are feeling better, you may resume normal activities 24 hours after you were sedated.  Keep all appointments with your health care provider. SEEK MEDICAL CARE IF:  Your skin is pale or bluish in color.  You continue to feel  nauseous or vomit.  Your pain is getting worse and is not helped by medicine.  You have bleeding or swelling.  You are still sleepy or feeling clumsy after 24 hours. SEEK IMMEDIATE MEDICAL CARE IF:  You develop a rash.  You have difficulty breathing.  You develop any type of allergic problem.  You have a fever. MAKE SURE YOU:  Understand these instructions.  Will watch your condition.  Will get help right away if you are not doing well or get worse.   This information is not intended to replace advice given to you by your health care provider. Make sure you discuss any questions you have with your health care provider.   Document Released: 12/10/2012 Document Revised: 03/12/2014 Document Reviewed: 12/10/2012 Elsevier Interactive Patient Education [employer].

## 2015-02-23 NOTE — Anesthesia Postprocedure Evaluation (Signed)
Anesthesia Post Note  Patient: [name]  Procedure(s) Performed: Procedure(s) (LRB): ESOPHAGEAL ENDOSCOPIC ULTRASOUND (EUS) RADIAL (N/A)  Patient location during evaluation: PACU Anesthesia Type: MAC Level of consciousness: awake and alert Pain management: pain level controlled Vital Signs Assessment: post-procedure vital signs reviewed and stable Respiratory status: spontaneous breathing, nonlabored ventilation, respiratory function stable and patient connected to nasal cannula oxygen Cardiovascular status: stable and blood pressure returned to baseline Anesthetic complications: no    Last Vitals:  Filed Vitals:   02/23/15 1210 02/23/15 1220  BP: 163/62 184/53  Pulse: 70 74  Temp:    Resp: 15 16    Last Pain: There were no vitals filed for this visit.               Brinlynn Gorton J

## 2015-02-23 NOTE — H&P (Signed)
Patient interval history reviewed.  Patient examined again.  There has been no change from documented H/P dated 02/22/15 (scanned into chart from our office) except as documented above.  Assessment:  1.  Pancreatitis, resolved. 2.  Elevated LFTs, resolved. 3.  Chronic pancreatic cystic lesion (head) and chronic bile duct dilatation.  Plan:  1.  Endoscopic ultrasound with possible biopsies (FNA). 2.  Risks (bleeding, infection, bowel perforation that could require surgery, sedation-related changes in cardiopulmonary systems), benefits (identification and possible treatment of source of symptoms, exclusion of certain causes of symptoms), and alternatives (watchful waiting, radiographic imaging studies, empiric medical treatment) of upper endoscopy with ultrasound and possible biopsies (EUS +/- FNA) were explained to patient/family in detail and patient wishes to proceed.

## 2015-02-24 ENCOUNTER — Encounter (HOSPITAL_COMMUNITY): Payer: Self-pay | Admitting: Gastroenterology

## 2015-03-07 DIAGNOSIS — I5033 Acute on chronic diastolic (congestive) heart failure: Secondary | ICD-10-CM | POA: Diagnosis not present

## 2015-03-07 DIAGNOSIS — I4892 Unspecified atrial flutter: Secondary | ICD-10-CM | POA: Diagnosis not present

## 2015-03-07 DIAGNOSIS — N183 Chronic kidney disease, stage 3 (moderate): Secondary | ICD-10-CM | POA: Diagnosis not present

## 2015-03-07 DIAGNOSIS — E1122 Type 2 diabetes mellitus with diabetic chronic kidney disease: Secondary | ICD-10-CM | POA: Diagnosis not present

## 2015-03-07 DIAGNOSIS — E1165 Type 2 diabetes mellitus with hyperglycemia: Secondary | ICD-10-CM | POA: Diagnosis not present

## 2015-03-07 DIAGNOSIS — L89612 Pressure ulcer of right heel, stage 2: Secondary | ICD-10-CM | POA: Diagnosis not present

## 2015-03-07 DIAGNOSIS — I129 Hypertensive chronic kidney disease with stage 1 through stage 4 chronic kidney disease, or unspecified chronic kidney disease: Secondary | ICD-10-CM | POA: Diagnosis not present

## 2015-03-10 DIAGNOSIS — I129 Hypertensive chronic kidney disease with stage 1 through stage 4 chronic kidney disease, or unspecified chronic kidney disease: Secondary | ICD-10-CM | POA: Diagnosis not present

## 2015-03-10 DIAGNOSIS — I4892 Unspecified atrial flutter: Secondary | ICD-10-CM | POA: Diagnosis not present

## 2015-03-10 DIAGNOSIS — N183 Chronic kidney disease, stage 3 (moderate): Secondary | ICD-10-CM | POA: Diagnosis not present

## 2015-03-10 DIAGNOSIS — L89612 Pressure ulcer of right heel, stage 2: Secondary | ICD-10-CM | POA: Diagnosis not present

## 2015-03-10 DIAGNOSIS — I5033 Acute on chronic diastolic (congestive) heart failure: Secondary | ICD-10-CM | POA: Diagnosis not present

## 2015-03-10 DIAGNOSIS — E1122 Type 2 diabetes mellitus with diabetic chronic kidney disease: Secondary | ICD-10-CM | POA: Diagnosis not present

## 2015-03-10 DIAGNOSIS — E1165 Type 2 diabetes mellitus with hyperglycemia: Secondary | ICD-10-CM | POA: Diagnosis not present

## 2015-03-11 DIAGNOSIS — I4892 Unspecified atrial flutter: Secondary | ICD-10-CM | POA: Diagnosis not present

## 2015-03-11 DIAGNOSIS — N183 Chronic kidney disease, stage 3 (moderate): Secondary | ICD-10-CM | POA: Diagnosis not present

## 2015-03-11 DIAGNOSIS — L89612 Pressure ulcer of right heel, stage 2: Secondary | ICD-10-CM | POA: Diagnosis not present

## 2015-03-11 DIAGNOSIS — E1165 Type 2 diabetes mellitus with hyperglycemia: Secondary | ICD-10-CM | POA: Diagnosis not present

## 2015-03-11 DIAGNOSIS — I129 Hypertensive chronic kidney disease with stage 1 through stage 4 chronic kidney disease, or unspecified chronic kidney disease: Secondary | ICD-10-CM | POA: Diagnosis not present

## 2015-03-11 DIAGNOSIS — E1122 Type 2 diabetes mellitus with diabetic chronic kidney disease: Secondary | ICD-10-CM | POA: Diagnosis not present

## 2015-03-11 DIAGNOSIS — I5033 Acute on chronic diastolic (congestive) heart failure: Secondary | ICD-10-CM | POA: Diagnosis not present

## 2015-03-15 DIAGNOSIS — I4892 Unspecified atrial flutter: Secondary | ICD-10-CM | POA: Diagnosis not present

## 2015-03-15 DIAGNOSIS — E1165 Type 2 diabetes mellitus with hyperglycemia: Secondary | ICD-10-CM | POA: Diagnosis not present

## 2015-03-15 DIAGNOSIS — N183 Chronic kidney disease, stage 3 (moderate): Secondary | ICD-10-CM | POA: Diagnosis not present

## 2015-03-15 DIAGNOSIS — E1122 Type 2 diabetes mellitus with diabetic chronic kidney disease: Secondary | ICD-10-CM | POA: Diagnosis not present

## 2015-03-15 DIAGNOSIS — L89612 Pressure ulcer of right heel, stage 2: Secondary | ICD-10-CM | POA: Diagnosis not present

## 2015-03-15 DIAGNOSIS — I5033 Acute on chronic diastolic (congestive) heart failure: Secondary | ICD-10-CM | POA: Diagnosis not present

## 2015-03-15 DIAGNOSIS — I129 Hypertensive chronic kidney disease with stage 1 through stage 4 chronic kidney disease, or unspecified chronic kidney disease: Secondary | ICD-10-CM | POA: Diagnosis not present

## 2015-03-17 DIAGNOSIS — E1122 Type 2 diabetes mellitus with diabetic chronic kidney disease: Secondary | ICD-10-CM | POA: Diagnosis not present

## 2015-03-17 DIAGNOSIS — L89612 Pressure ulcer of right heel, stage 2: Secondary | ICD-10-CM | POA: Diagnosis not present

## 2015-03-17 DIAGNOSIS — N183 Chronic kidney disease, stage 3 (moderate): Secondary | ICD-10-CM | POA: Diagnosis not present

## 2015-03-17 DIAGNOSIS — I129 Hypertensive chronic kidney disease with stage 1 through stage 4 chronic kidney disease, or unspecified chronic kidney disease: Secondary | ICD-10-CM | POA: Diagnosis not present

## 2015-03-17 DIAGNOSIS — I5033 Acute on chronic diastolic (congestive) heart failure: Secondary | ICD-10-CM | POA: Diagnosis not present

## 2015-03-17 DIAGNOSIS — E1165 Type 2 diabetes mellitus with hyperglycemia: Secondary | ICD-10-CM | POA: Diagnosis not present

## 2015-03-17 DIAGNOSIS — I4892 Unspecified atrial flutter: Secondary | ICD-10-CM | POA: Diagnosis not present

## 2015-03-18 DIAGNOSIS — L89612 Pressure ulcer of right heel, stage 2: Secondary | ICD-10-CM | POA: Diagnosis not present

## 2015-03-18 DIAGNOSIS — E1122 Type 2 diabetes mellitus with diabetic chronic kidney disease: Secondary | ICD-10-CM | POA: Diagnosis not present

## 2015-03-18 DIAGNOSIS — N183 Chronic kidney disease, stage 3 (moderate): Secondary | ICD-10-CM | POA: Diagnosis not present

## 2015-03-18 DIAGNOSIS — I4892 Unspecified atrial flutter: Secondary | ICD-10-CM | POA: Diagnosis not present

## 2015-03-18 DIAGNOSIS — E1165 Type 2 diabetes mellitus with hyperglycemia: Secondary | ICD-10-CM | POA: Diagnosis not present

## 2015-03-18 DIAGNOSIS — I5033 Acute on chronic diastolic (congestive) heart failure: Secondary | ICD-10-CM | POA: Diagnosis not present

## 2015-03-18 DIAGNOSIS — I129 Hypertensive chronic kidney disease with stage 1 through stage 4 chronic kidney disease, or unspecified chronic kidney disease: Secondary | ICD-10-CM | POA: Diagnosis not present

## 2015-03-21 DIAGNOSIS — F3132 Bipolar disorder, current episode depressed, moderate: Secondary | ICD-10-CM | POA: Diagnosis not present

## 2015-03-21 NOTE — Progress Notes (Signed)
PT evaluation addendum Late entry for 2015/01/28 - g-codes    2015/01/28 1500  PT Time Calculation  PT Start Time (ACUTE ONLY) 1405  PT Stop Time (ACUTE ONLY) 1435  PT Time Calculation (min) (ACUTE ONLY) 30 min  PT G-Codes **NOT FOR INPATIENT CLASS**  Functional Assessment Tool Used clinical judgement/chart review  Functional Limitation Mobility: Walking and moving around  Mobility: Walking and Moving Around Current Status [id number]) CI  Mobility: Walking and Moving Around Goal Status [id number]) CI  PT General Charges  $$ ACUTE PT VISIT 1 Procedure  PT Treatments  $Gait Training 8-22 mins   03/21/2015 [place], PT [phone]

## 2015-03-22 DIAGNOSIS — N183 Chronic kidney disease, stage 3 (moderate): Secondary | ICD-10-CM | POA: Diagnosis not present

## 2015-03-22 DIAGNOSIS — E1122 Type 2 diabetes mellitus with diabetic chronic kidney disease: Secondary | ICD-10-CM | POA: Diagnosis not present

## 2015-03-22 DIAGNOSIS — I129 Hypertensive chronic kidney disease with stage 1 through stage 4 chronic kidney disease, or unspecified chronic kidney disease: Secondary | ICD-10-CM | POA: Diagnosis not present

## 2015-03-22 DIAGNOSIS — K830 Cholangitis: Secondary | ICD-10-CM | POA: Diagnosis not present

## 2015-03-22 DIAGNOSIS — A419 Sepsis, unspecified organism: Secondary | ICD-10-CM | POA: Diagnosis not present

## 2015-03-22 DIAGNOSIS — K851 Biliary acute pancreatitis without necrosis or infection: Secondary | ICD-10-CM | POA: Diagnosis not present

## 2015-03-22 DIAGNOSIS — I4892 Unspecified atrial flutter: Secondary | ICD-10-CM | POA: Diagnosis not present

## 2015-03-22 DIAGNOSIS — I5033 Acute on chronic diastolic (congestive) heart failure: Secondary | ICD-10-CM | POA: Diagnosis not present

## 2015-03-22 DIAGNOSIS — L89612 Pressure ulcer of right heel, stage 2: Secondary | ICD-10-CM | POA: Diagnosis not present

## 2015-03-22 DIAGNOSIS — E1165 Type 2 diabetes mellitus with hyperglycemia: Secondary | ICD-10-CM | POA: Diagnosis not present

## 2015-03-31 DIAGNOSIS — R109 Unspecified abdominal pain: Secondary | ICD-10-CM | POA: Diagnosis not present

## 2015-04-01 DIAGNOSIS — I4892 Unspecified atrial flutter: Secondary | ICD-10-CM | POA: Diagnosis not present

## 2015-04-01 DIAGNOSIS — E1122 Type 2 diabetes mellitus with diabetic chronic kidney disease: Secondary | ICD-10-CM | POA: Diagnosis not present

## 2015-04-01 DIAGNOSIS — K830 Cholangitis: Secondary | ICD-10-CM | POA: Diagnosis not present

## 2015-04-01 DIAGNOSIS — E1165 Type 2 diabetes mellitus with hyperglycemia: Secondary | ICD-10-CM | POA: Diagnosis not present

## 2015-04-01 DIAGNOSIS — I5033 Acute on chronic diastolic (congestive) heart failure: Secondary | ICD-10-CM | POA: Diagnosis not present

## 2015-04-01 DIAGNOSIS — K851 Biliary acute pancreatitis without necrosis or infection: Secondary | ICD-10-CM | POA: Diagnosis not present

## 2015-04-01 DIAGNOSIS — A419 Sepsis, unspecified organism: Secondary | ICD-10-CM | POA: Diagnosis not present

## 2015-04-01 DIAGNOSIS — L89612 Pressure ulcer of right heel, stage 2: Secondary | ICD-10-CM | POA: Diagnosis not present

## 2015-04-01 DIAGNOSIS — N183 Chronic kidney disease, stage 3 (moderate): Secondary | ICD-10-CM | POA: Diagnosis not present

## 2015-04-01 DIAGNOSIS — I129 Hypertensive chronic kidney disease with stage 1 through stage 4 chronic kidney disease, or unspecified chronic kidney disease: Secondary | ICD-10-CM | POA: Diagnosis not present

## 2015-04-04 DIAGNOSIS — N183 Chronic kidney disease, stage 3 (moderate): Secondary | ICD-10-CM | POA: Diagnosis not present

## 2015-04-04 DIAGNOSIS — E1122 Type 2 diabetes mellitus with diabetic chronic kidney disease: Secondary | ICD-10-CM | POA: Diagnosis not present

## 2015-04-04 DIAGNOSIS — E1165 Type 2 diabetes mellitus with hyperglycemia: Secondary | ICD-10-CM | POA: Diagnosis not present

## 2015-04-04 DIAGNOSIS — I5033 Acute on chronic diastolic (congestive) heart failure: Secondary | ICD-10-CM | POA: Diagnosis not present

## 2015-04-04 DIAGNOSIS — I4892 Unspecified atrial flutter: Secondary | ICD-10-CM | POA: Diagnosis not present

## 2015-04-04 DIAGNOSIS — K830 Cholangitis: Secondary | ICD-10-CM | POA: Diagnosis not present

## 2015-04-04 DIAGNOSIS — K851 Biliary acute pancreatitis without necrosis or infection: Secondary | ICD-10-CM | POA: Diagnosis not present

## 2015-04-04 DIAGNOSIS — A419 Sepsis, unspecified organism: Secondary | ICD-10-CM | POA: Diagnosis not present

## 2015-04-04 DIAGNOSIS — I129 Hypertensive chronic kidney disease with stage 1 through stage 4 chronic kidney disease, or unspecified chronic kidney disease: Secondary | ICD-10-CM | POA: Diagnosis not present

## 2015-04-04 DIAGNOSIS — L89612 Pressure ulcer of right heel, stage 2: Secondary | ICD-10-CM | POA: Diagnosis not present

## 2015-04-06 DIAGNOSIS — E1122 Type 2 diabetes mellitus with diabetic chronic kidney disease: Secondary | ICD-10-CM | POA: Diagnosis not present

## 2015-04-06 DIAGNOSIS — I129 Hypertensive chronic kidney disease with stage 1 through stage 4 chronic kidney disease, or unspecified chronic kidney disease: Secondary | ICD-10-CM | POA: Diagnosis not present

## 2015-04-06 DIAGNOSIS — K830 Cholangitis: Secondary | ICD-10-CM | POA: Diagnosis not present

## 2015-04-06 DIAGNOSIS — A419 Sepsis, unspecified organism: Secondary | ICD-10-CM | POA: Diagnosis not present

## 2015-04-06 DIAGNOSIS — L89612 Pressure ulcer of right heel, stage 2: Secondary | ICD-10-CM | POA: Diagnosis not present

## 2015-04-06 DIAGNOSIS — I5033 Acute on chronic diastolic (congestive) heart failure: Secondary | ICD-10-CM | POA: Diagnosis not present

## 2015-04-06 DIAGNOSIS — N183 Chronic kidney disease, stage 3 (moderate): Secondary | ICD-10-CM | POA: Diagnosis not present

## 2015-04-06 DIAGNOSIS — I4892 Unspecified atrial flutter: Secondary | ICD-10-CM | POA: Diagnosis not present

## 2015-04-06 DIAGNOSIS — K851 Biliary acute pancreatitis without necrosis or infection: Secondary | ICD-10-CM | POA: Diagnosis not present

## 2015-04-06 DIAGNOSIS — E1165 Type 2 diabetes mellitus with hyperglycemia: Secondary | ICD-10-CM | POA: Diagnosis not present

## 2015-04-07 DIAGNOSIS — E1122 Type 2 diabetes mellitus with diabetic chronic kidney disease: Secondary | ICD-10-CM | POA: Diagnosis not present

## 2015-04-07 DIAGNOSIS — K851 Biliary acute pancreatitis without necrosis or infection: Secondary | ICD-10-CM | POA: Diagnosis not present

## 2015-04-07 DIAGNOSIS — N183 Chronic kidney disease, stage 3 (moderate): Secondary | ICD-10-CM | POA: Diagnosis not present

## 2015-04-07 DIAGNOSIS — K830 Cholangitis: Secondary | ICD-10-CM | POA: Diagnosis not present

## 2015-04-07 DIAGNOSIS — A419 Sepsis, unspecified organism: Secondary | ICD-10-CM | POA: Diagnosis not present

## 2015-04-07 DIAGNOSIS — I4892 Unspecified atrial flutter: Secondary | ICD-10-CM | POA: Diagnosis not present

## 2015-04-07 DIAGNOSIS — L89612 Pressure ulcer of right heel, stage 2: Secondary | ICD-10-CM | POA: Diagnosis not present

## 2015-04-07 DIAGNOSIS — I5033 Acute on chronic diastolic (congestive) heart failure: Secondary | ICD-10-CM | POA: Diagnosis not present

## 2015-04-07 DIAGNOSIS — E1165 Type 2 diabetes mellitus with hyperglycemia: Secondary | ICD-10-CM | POA: Diagnosis not present

## 2015-04-07 DIAGNOSIS — I129 Hypertensive chronic kidney disease with stage 1 through stage 4 chronic kidney disease, or unspecified chronic kidney disease: Secondary | ICD-10-CM | POA: Diagnosis not present

## 2015-04-08 DIAGNOSIS — R4701 Aphasia: Secondary | ICD-10-CM | POA: Diagnosis not present

## 2015-04-08 DIAGNOSIS — Z7984 Long term (current) use of oral hypoglycemic drugs: Secondary | ICD-10-CM | POA: Diagnosis not present

## 2015-04-08 DIAGNOSIS — E1142 Type 2 diabetes mellitus with diabetic polyneuropathy: Secondary | ICD-10-CM | POA: Diagnosis not present

## 2015-04-08 DIAGNOSIS — G2401 Drug induced subacute dyskinesia: Secondary | ICD-10-CM | POA: Diagnosis not present

## 2015-04-08 DIAGNOSIS — I129 Hypertensive chronic kidney disease with stage 1 through stage 4 chronic kidney disease, or unspecified chronic kidney disease: Secondary | ICD-10-CM | POA: Diagnosis not present

## 2015-04-08 DIAGNOSIS — N183 Chronic kidney disease, stage 3 (moderate): Secondary | ICD-10-CM | POA: Diagnosis not present

## 2015-04-08 DIAGNOSIS — E1122 Type 2 diabetes mellitus with diabetic chronic kidney disease: Secondary | ICD-10-CM | POA: Diagnosis not present

## 2015-04-08 DIAGNOSIS — K219 Gastro-esophageal reflux disease without esophagitis: Secondary | ICD-10-CM | POA: Diagnosis not present

## 2015-04-13 DIAGNOSIS — I5033 Acute on chronic diastolic (congestive) heart failure: Secondary | ICD-10-CM | POA: Diagnosis not present

## 2015-04-13 DIAGNOSIS — I4892 Unspecified atrial flutter: Secondary | ICD-10-CM | POA: Diagnosis not present

## 2015-04-13 DIAGNOSIS — N183 Chronic kidney disease, stage 3 (moderate): Secondary | ICD-10-CM | POA: Diagnosis not present

## 2015-04-13 DIAGNOSIS — K851 Biliary acute pancreatitis without necrosis or infection: Secondary | ICD-10-CM | POA: Diagnosis not present

## 2015-04-13 DIAGNOSIS — E1122 Type 2 diabetes mellitus with diabetic chronic kidney disease: Secondary | ICD-10-CM | POA: Diagnosis not present

## 2015-04-13 DIAGNOSIS — I129 Hypertensive chronic kidney disease with stage 1 through stage 4 chronic kidney disease, or unspecified chronic kidney disease: Secondary | ICD-10-CM | POA: Diagnosis not present

## 2015-04-13 DIAGNOSIS — E1165 Type 2 diabetes mellitus with hyperglycemia: Secondary | ICD-10-CM | POA: Diagnosis not present

## 2015-04-13 DIAGNOSIS — K830 Cholangitis: Secondary | ICD-10-CM | POA: Diagnosis not present

## 2015-04-13 DIAGNOSIS — A419 Sepsis, unspecified organism: Secondary | ICD-10-CM | POA: Diagnosis not present

## 2015-04-13 DIAGNOSIS — L89612 Pressure ulcer of right heel, stage 2: Secondary | ICD-10-CM | POA: Diagnosis not present

## 2015-04-21 DIAGNOSIS — I4892 Unspecified atrial flutter: Secondary | ICD-10-CM | POA: Diagnosis not present

## 2015-04-21 DIAGNOSIS — E1165 Type 2 diabetes mellitus with hyperglycemia: Secondary | ICD-10-CM | POA: Diagnosis not present

## 2015-04-21 DIAGNOSIS — I5033 Acute on chronic diastolic (congestive) heart failure: Secondary | ICD-10-CM | POA: Diagnosis not present

## 2015-04-21 DIAGNOSIS — A419 Sepsis, unspecified organism: Secondary | ICD-10-CM | POA: Diagnosis not present

## 2015-04-21 DIAGNOSIS — E1122 Type 2 diabetes mellitus with diabetic chronic kidney disease: Secondary | ICD-10-CM | POA: Diagnosis not present

## 2015-04-21 DIAGNOSIS — K830 Cholangitis: Secondary | ICD-10-CM | POA: Diagnosis not present

## 2015-04-21 DIAGNOSIS — N183 Chronic kidney disease, stage 3 (moderate): Secondary | ICD-10-CM | POA: Diagnosis not present

## 2015-04-21 DIAGNOSIS — L89612 Pressure ulcer of right heel, stage 2: Secondary | ICD-10-CM | POA: Diagnosis not present

## 2015-04-21 DIAGNOSIS — I129 Hypertensive chronic kidney disease with stage 1 through stage 4 chronic kidney disease, or unspecified chronic kidney disease: Secondary | ICD-10-CM | POA: Diagnosis not present

## 2015-04-21 DIAGNOSIS — K851 Biliary acute pancreatitis without necrosis or infection: Secondary | ICD-10-CM | POA: Diagnosis not present

## 2015-04-26 DIAGNOSIS — K830 Cholangitis: Secondary | ICD-10-CM | POA: Diagnosis not present

## 2015-04-26 DIAGNOSIS — K851 Biliary acute pancreatitis without necrosis or infection: Secondary | ICD-10-CM | POA: Diagnosis not present

## 2015-04-26 DIAGNOSIS — A419 Sepsis, unspecified organism: Secondary | ICD-10-CM | POA: Diagnosis not present

## 2015-04-26 DIAGNOSIS — L89612 Pressure ulcer of right heel, stage 2: Secondary | ICD-10-CM | POA: Diagnosis not present

## 2015-04-26 DIAGNOSIS — N183 Chronic kidney disease, stage 3 (moderate): Secondary | ICD-10-CM | POA: Diagnosis not present

## 2015-04-26 DIAGNOSIS — I5033 Acute on chronic diastolic (congestive) heart failure: Secondary | ICD-10-CM | POA: Diagnosis not present

## 2015-04-26 DIAGNOSIS — I4892 Unspecified atrial flutter: Secondary | ICD-10-CM | POA: Diagnosis not present

## 2015-04-26 DIAGNOSIS — E1122 Type 2 diabetes mellitus with diabetic chronic kidney disease: Secondary | ICD-10-CM | POA: Diagnosis not present

## 2015-04-26 DIAGNOSIS — I129 Hypertensive chronic kidney disease with stage 1 through stage 4 chronic kidney disease, or unspecified chronic kidney disease: Secondary | ICD-10-CM | POA: Diagnosis not present

## 2015-04-26 DIAGNOSIS — E1165 Type 2 diabetes mellitus with hyperglycemia: Secondary | ICD-10-CM | POA: Diagnosis not present

## 2015-05-03 DIAGNOSIS — A419 Sepsis, unspecified organism: Secondary | ICD-10-CM | POA: Diagnosis not present

## 2015-05-03 DIAGNOSIS — I129 Hypertensive chronic kidney disease with stage 1 through stage 4 chronic kidney disease, or unspecified chronic kidney disease: Secondary | ICD-10-CM | POA: Diagnosis not present

## 2015-05-03 DIAGNOSIS — N183 Chronic kidney disease, stage 3 (moderate): Secondary | ICD-10-CM | POA: Diagnosis not present

## 2015-05-03 DIAGNOSIS — I5033 Acute on chronic diastolic (congestive) heart failure: Secondary | ICD-10-CM | POA: Diagnosis not present

## 2015-05-03 DIAGNOSIS — K830 Cholangitis: Secondary | ICD-10-CM | POA: Diagnosis not present

## 2015-05-03 DIAGNOSIS — E1122 Type 2 diabetes mellitus with diabetic chronic kidney disease: Secondary | ICD-10-CM | POA: Diagnosis not present

## 2015-05-03 DIAGNOSIS — K851 Biliary acute pancreatitis without necrosis or infection: Secondary | ICD-10-CM | POA: Diagnosis not present

## 2015-05-03 DIAGNOSIS — L89612 Pressure ulcer of right heel, stage 2: Secondary | ICD-10-CM | POA: Diagnosis not present

## 2015-05-03 DIAGNOSIS — I4892 Unspecified atrial flutter: Secondary | ICD-10-CM | POA: Diagnosis not present

## 2015-05-03 DIAGNOSIS — E1165 Type 2 diabetes mellitus with hyperglycemia: Secondary | ICD-10-CM | POA: Diagnosis not present

## 2015-05-10 DIAGNOSIS — E1165 Type 2 diabetes mellitus with hyperglycemia: Secondary | ICD-10-CM | POA: Diagnosis not present

## 2015-05-10 DIAGNOSIS — I4892 Unspecified atrial flutter: Secondary | ICD-10-CM | POA: Diagnosis not present

## 2015-05-10 DIAGNOSIS — I129 Hypertensive chronic kidney disease with stage 1 through stage 4 chronic kidney disease, or unspecified chronic kidney disease: Secondary | ICD-10-CM | POA: Diagnosis not present

## 2015-05-10 DIAGNOSIS — I5033 Acute on chronic diastolic (congestive) heart failure: Secondary | ICD-10-CM | POA: Diagnosis not present

## 2015-05-10 DIAGNOSIS — A419 Sepsis, unspecified organism: Secondary | ICD-10-CM | POA: Diagnosis not present

## 2015-05-10 DIAGNOSIS — K830 Cholangitis: Secondary | ICD-10-CM | POA: Diagnosis not present

## 2015-05-10 DIAGNOSIS — K851 Biliary acute pancreatitis without necrosis or infection: Secondary | ICD-10-CM | POA: Diagnosis not present

## 2015-05-10 DIAGNOSIS — N183 Chronic kidney disease, stage 3 (moderate): Secondary | ICD-10-CM | POA: Diagnosis not present

## 2015-05-10 DIAGNOSIS — L89612 Pressure ulcer of right heel, stage 2: Secondary | ICD-10-CM | POA: Diagnosis not present

## 2015-05-10 DIAGNOSIS — E1122 Type 2 diabetes mellitus with diabetic chronic kidney disease: Secondary | ICD-10-CM | POA: Diagnosis not present

## 2015-05-17 DIAGNOSIS — I5033 Acute on chronic diastolic (congestive) heart failure: Secondary | ICD-10-CM | POA: Diagnosis not present

## 2015-05-17 DIAGNOSIS — A419 Sepsis, unspecified organism: Secondary | ICD-10-CM | POA: Diagnosis not present

## 2015-05-17 DIAGNOSIS — N183 Chronic kidney disease, stage 3 (moderate): Secondary | ICD-10-CM | POA: Diagnosis not present

## 2015-05-17 DIAGNOSIS — K851 Biliary acute pancreatitis without necrosis or infection: Secondary | ICD-10-CM | POA: Diagnosis not present

## 2015-05-17 DIAGNOSIS — K830 Cholangitis: Secondary | ICD-10-CM | POA: Diagnosis not present

## 2015-05-17 DIAGNOSIS — I4892 Unspecified atrial flutter: Secondary | ICD-10-CM | POA: Diagnosis not present

## 2015-05-17 DIAGNOSIS — I129 Hypertensive chronic kidney disease with stage 1 through stage 4 chronic kidney disease, or unspecified chronic kidney disease: Secondary | ICD-10-CM | POA: Diagnosis not present

## 2015-05-17 DIAGNOSIS — E1122 Type 2 diabetes mellitus with diabetic chronic kidney disease: Secondary | ICD-10-CM | POA: Diagnosis not present

## 2015-05-17 DIAGNOSIS — L89612 Pressure ulcer of right heel, stage 2: Secondary | ICD-10-CM | POA: Diagnosis not present

## 2015-05-17 DIAGNOSIS — E1165 Type 2 diabetes mellitus with hyperglycemia: Secondary | ICD-10-CM | POA: Diagnosis not present

## 2015-06-10 ENCOUNTER — Other Ambulatory Visit: Payer: Self-pay | Admitting: Physician Assistant

## 2015-06-10 DIAGNOSIS — R1013 Epigastric pain: Secondary | ICD-10-CM

## 2015-06-10 DIAGNOSIS — R110 Nausea: Secondary | ICD-10-CM | POA: Diagnosis not present

## 2015-06-10 DIAGNOSIS — K5732 Diverticulitis of large intestine without perforation or abscess without bleeding: Secondary | ICD-10-CM | POA: Diagnosis not present

## 2015-06-10 DIAGNOSIS — R7989 Other specified abnormal findings of blood chemistry: Secondary | ICD-10-CM

## 2015-06-10 DIAGNOSIS — R945 Abnormal results of liver function studies: Principal | ICD-10-CM

## 2015-06-13 ENCOUNTER — Other Ambulatory Visit: Payer: Medicare Other

## 2015-06-14 ENCOUNTER — Other Ambulatory Visit: Payer: Medicare Other

## 2015-06-17 ENCOUNTER — Inpatient Hospital Stay: Admission: RE | Admit: 2015-06-17 | Payer: Medicare Other | Source: Ambulatory Visit

## 2015-06-19 ENCOUNTER — Ambulatory Visit
Admission: RE | Admit: 2015-06-19 | Discharge: 2015-06-19 | Disposition: A | Discharge status: Home / Self Care | Payer: PPO | Source: Ambulatory Visit | Attending: Physician Assistant | Admitting: Physician Assistant

## 2015-06-19 DIAGNOSIS — R110 Nausea: Secondary | ICD-10-CM

## 2015-06-19 DIAGNOSIS — R935 Abnormal findings on diagnostic imaging of other abdominal regions, including retroperitoneum: Secondary | ICD-10-CM | POA: Diagnosis not present

## 2015-06-19 DIAGNOSIS — K862 Cyst of pancreas: Secondary | ICD-10-CM | POA: Diagnosis not present

## 2015-06-19 DIAGNOSIS — R1013 Epigastric pain: Secondary | ICD-10-CM

## 2015-06-19 DIAGNOSIS — R7989 Other specified abnormal findings of blood chemistry: Secondary | ICD-10-CM

## 2015-06-19 DIAGNOSIS — R945 Abnormal results of liver function studies: Principal | ICD-10-CM

## 2015-06-19 MED ORDER — GADOBENATE DIMEGLUMINE 529 MG/ML IV SOLN
15.0000 mL | Freq: Once | INTRAVENOUS | Status: AC | PRN
  Administered 2015-06-19: 15 mL via INTRAVENOUS

## 2015-06-24 DIAGNOSIS — F3132 Bipolar disorder, current episode depressed, moderate: Secondary | ICD-10-CM | POA: Diagnosis not present

## 2015-06-24 DIAGNOSIS — R1013 Epigastric pain: Secondary | ICD-10-CM | POA: Diagnosis not present

## 2015-08-12 DIAGNOSIS — K219 Gastro-esophageal reflux disease without esophagitis: Secondary | ICD-10-CM | POA: Diagnosis not present

## 2015-08-12 DIAGNOSIS — Z5181 Encounter for therapeutic drug level monitoring: Secondary | ICD-10-CM | POA: Diagnosis not present

## 2015-08-12 DIAGNOSIS — M797 Fibromyalgia: Secondary | ICD-10-CM | POA: Diagnosis not present

## 2015-08-12 DIAGNOSIS — E1142 Type 2 diabetes mellitus with diabetic polyneuropathy: Secondary | ICD-10-CM | POA: Diagnosis not present

## 2015-08-12 DIAGNOSIS — E1122 Type 2 diabetes mellitus with diabetic chronic kidney disease: Secondary | ICD-10-CM | POA: Diagnosis not present

## 2015-08-12 DIAGNOSIS — D510 Vitamin B12 deficiency anemia due to intrinsic factor deficiency: Secondary | ICD-10-CM | POA: Diagnosis not present

## 2015-08-12 DIAGNOSIS — Z1389 Encounter for screening for other disorder: Secondary | ICD-10-CM | POA: Diagnosis not present

## 2015-08-12 DIAGNOSIS — I129 Hypertensive chronic kidney disease with stage 1 through stage 4 chronic kidney disease, or unspecified chronic kidney disease: Secondary | ICD-10-CM | POA: Diagnosis not present

## 2015-08-12 DIAGNOSIS — Z79899 Other long term (current) drug therapy: Secondary | ICD-10-CM | POA: Diagnosis not present

## 2015-08-12 DIAGNOSIS — Z6828 Body mass index (BMI) 28.0-28.9, adult: Secondary | ICD-10-CM | POA: Diagnosis not present

## 2015-08-12 DIAGNOSIS — E782 Mixed hyperlipidemia: Secondary | ICD-10-CM | POA: Diagnosis not present

## 2015-08-12 DIAGNOSIS — Z0000 Encounter for general adult medical examination without abnormal findings: Secondary | ICD-10-CM | POA: Diagnosis not present

## 2015-08-12 DIAGNOSIS — F319 Bipolar disorder, unspecified: Secondary | ICD-10-CM | POA: Diagnosis not present

## 2015-08-12 DIAGNOSIS — N183 Chronic kidney disease, stage 3 (moderate): Secondary | ICD-10-CM | POA: Diagnosis not present

## 2015-08-12 DIAGNOSIS — Z7984 Long term (current) use of oral hypoglycemic drugs: Secondary | ICD-10-CM | POA: Diagnosis not present

## 2015-09-05 DIAGNOSIS — R410 Disorientation, unspecified: Secondary | ICD-10-CM | POA: Diagnosis not present

## 2015-11-04 DIAGNOSIS — F3132 Bipolar disorder, current episode depressed, moderate: Secondary | ICD-10-CM | POA: Diagnosis not present

## 2015-11-13 DIAGNOSIS — E1122 Type 2 diabetes mellitus with diabetic chronic kidney disease: Secondary | ICD-10-CM | POA: Diagnosis not present

## 2015-11-13 DIAGNOSIS — F319 Bipolar disorder, unspecified: Secondary | ICD-10-CM | POA: Diagnosis not present

## 2015-11-13 DIAGNOSIS — Z7982 Long term (current) use of aspirin: Secondary | ICD-10-CM | POA: Diagnosis not present

## 2015-11-13 DIAGNOSIS — E1142 Type 2 diabetes mellitus with diabetic polyneuropathy: Secondary | ICD-10-CM | POA: Diagnosis not present

## 2015-11-13 DIAGNOSIS — I2510 Atherosclerotic heart disease of native coronary artery without angina pectoris: Secondary | ICD-10-CM | POA: Diagnosis not present

## 2015-11-13 DIAGNOSIS — L89610 Pressure ulcer of right heel, unstageable: Secondary | ICD-10-CM | POA: Diagnosis not present

## 2015-11-13 DIAGNOSIS — Z7984 Long term (current) use of oral hypoglycemic drugs: Secondary | ICD-10-CM | POA: Diagnosis not present

## 2015-11-13 DIAGNOSIS — I5030 Unspecified diastolic (congestive) heart failure: Secondary | ICD-10-CM | POA: Diagnosis not present

## 2015-11-13 DIAGNOSIS — I130 Hypertensive heart and chronic kidney disease with heart failure and stage 1 through stage 4 chronic kidney disease, or unspecified chronic kidney disease: Secondary | ICD-10-CM | POA: Diagnosis not present

## 2015-11-13 DIAGNOSIS — L89620 Pressure ulcer of left heel, unstageable: Secondary | ICD-10-CM | POA: Diagnosis not present

## 2015-11-13 DIAGNOSIS — H3530 Unspecified macular degeneration: Secondary | ICD-10-CM | POA: Diagnosis not present

## 2015-11-13 DIAGNOSIS — I480 Paroxysmal atrial fibrillation: Secondary | ICD-10-CM | POA: Diagnosis not present

## 2015-11-13 DIAGNOSIS — N183 Chronic kidney disease, stage 3 (moderate): Secondary | ICD-10-CM | POA: Diagnosis not present

## 2015-11-15 DIAGNOSIS — I2510 Atherosclerotic heart disease of native coronary artery without angina pectoris: Secondary | ICD-10-CM | POA: Diagnosis not present

## 2015-11-15 DIAGNOSIS — E1122 Type 2 diabetes mellitus with diabetic chronic kidney disease: Secondary | ICD-10-CM | POA: Diagnosis not present

## 2015-11-15 DIAGNOSIS — L89620 Pressure ulcer of left heel, unstageable: Secondary | ICD-10-CM | POA: Diagnosis not present

## 2015-11-15 DIAGNOSIS — E1142 Type 2 diabetes mellitus with diabetic polyneuropathy: Secondary | ICD-10-CM | POA: Diagnosis not present

## 2015-11-15 DIAGNOSIS — I5030 Unspecified diastolic (congestive) heart failure: Secondary | ICD-10-CM | POA: Diagnosis not present

## 2015-11-15 DIAGNOSIS — I480 Paroxysmal atrial fibrillation: Secondary | ICD-10-CM | POA: Diagnosis not present

## 2015-11-15 DIAGNOSIS — L89610 Pressure ulcer of right heel, unstageable: Secondary | ICD-10-CM | POA: Diagnosis not present

## 2015-11-15 DIAGNOSIS — F319 Bipolar disorder, unspecified: Secondary | ICD-10-CM | POA: Diagnosis not present

## 2015-11-15 DIAGNOSIS — H3530 Unspecified macular degeneration: Secondary | ICD-10-CM | POA: Diagnosis not present

## 2015-11-15 DIAGNOSIS — N183 Chronic kidney disease, stage 3 (moderate): Secondary | ICD-10-CM | POA: Diagnosis not present

## 2015-11-15 DIAGNOSIS — I130 Hypertensive heart and chronic kidney disease with heart failure and stage 1 through stage 4 chronic kidney disease, or unspecified chronic kidney disease: Secondary | ICD-10-CM | POA: Diagnosis not present

## 2015-11-15 DIAGNOSIS — Z7984 Long term (current) use of oral hypoglycemic drugs: Secondary | ICD-10-CM | POA: Diagnosis not present

## 2015-11-15 DIAGNOSIS — Z7982 Long term (current) use of aspirin: Secondary | ICD-10-CM | POA: Diagnosis not present

## 2015-11-17 DIAGNOSIS — E1122 Type 2 diabetes mellitus with diabetic chronic kidney disease: Secondary | ICD-10-CM | POA: Diagnosis not present

## 2015-11-17 DIAGNOSIS — H3530 Unspecified macular degeneration: Secondary | ICD-10-CM | POA: Diagnosis not present

## 2015-11-17 DIAGNOSIS — L89620 Pressure ulcer of left heel, unstageable: Secondary | ICD-10-CM | POA: Diagnosis not present

## 2015-11-17 DIAGNOSIS — I130 Hypertensive heart and chronic kidney disease with heart failure and stage 1 through stage 4 chronic kidney disease, or unspecified chronic kidney disease: Secondary | ICD-10-CM | POA: Diagnosis not present

## 2015-11-17 DIAGNOSIS — F319 Bipolar disorder, unspecified: Secondary | ICD-10-CM | POA: Diagnosis not present

## 2015-11-17 DIAGNOSIS — I2510 Atherosclerotic heart disease of native coronary artery without angina pectoris: Secondary | ICD-10-CM | POA: Diagnosis not present

## 2015-11-17 DIAGNOSIS — I5030 Unspecified diastolic (congestive) heart failure: Secondary | ICD-10-CM | POA: Diagnosis not present

## 2015-11-17 DIAGNOSIS — E1142 Type 2 diabetes mellitus with diabetic polyneuropathy: Secondary | ICD-10-CM | POA: Diagnosis not present

## 2015-11-17 DIAGNOSIS — L89610 Pressure ulcer of right heel, unstageable: Secondary | ICD-10-CM | POA: Diagnosis not present

## 2015-11-17 DIAGNOSIS — I480 Paroxysmal atrial fibrillation: Secondary | ICD-10-CM | POA: Diagnosis not present

## 2015-11-17 DIAGNOSIS — N183 Chronic kidney disease, stage 3 (moderate): Secondary | ICD-10-CM | POA: Diagnosis not present

## 2015-11-17 DIAGNOSIS — Z7982 Long term (current) use of aspirin: Secondary | ICD-10-CM | POA: Diagnosis not present

## 2015-11-17 DIAGNOSIS — Z7984 Long term (current) use of oral hypoglycemic drugs: Secondary | ICD-10-CM | POA: Diagnosis not present

## 2015-12-18 ENCOUNTER — Emergency Department (HOSPITAL_COMMUNITY): Payer: PPO

## 2015-12-18 ENCOUNTER — Encounter (HOSPITAL_COMMUNITY): Payer: Self-pay | Admitting: Emergency Medicine

## 2015-12-18 ENCOUNTER — Inpatient Hospital Stay (HOSPITAL_COMMUNITY)
Admission: EM | Admit: 2015-12-18 | Discharge: 2015-12-23 | DRG: 854 | Disposition: A | Discharge status: Home / Self Care | Payer: PPO | Attending: Internal Medicine | Admitting: Internal Medicine

## 2015-12-18 DIAGNOSIS — N183 Chronic kidney disease, stage 3 unspecified: Secondary | ICD-10-CM | POA: Diagnosis present

## 2015-12-18 DIAGNOSIS — Z96642 Presence of left artificial hip joint: Secondary | ICD-10-CM | POA: Diagnosis not present

## 2015-12-18 DIAGNOSIS — E860 Dehydration: Secondary | ICD-10-CM | POA: Diagnosis not present

## 2015-12-18 DIAGNOSIS — I5032 Chronic diastolic (congestive) heart failure: Secondary | ICD-10-CM | POA: Diagnosis not present

## 2015-12-18 DIAGNOSIS — A419 Sepsis, unspecified organism: Secondary | ICD-10-CM | POA: Diagnosis present

## 2015-12-18 DIAGNOSIS — Z96652 Presence of left artificial knee joint: Secondary | ICD-10-CM | POA: Diagnosis not present

## 2015-12-18 DIAGNOSIS — R109 Unspecified abdominal pain: Secondary | ICD-10-CM | POA: Diagnosis not present

## 2015-12-18 DIAGNOSIS — I959 Hypotension, unspecified: Secondary | ICD-10-CM | POA: Diagnosis present

## 2015-12-18 DIAGNOSIS — H3530 Unspecified macular degeneration: Secondary | ICD-10-CM | POA: Diagnosis present

## 2015-12-18 DIAGNOSIS — K219 Gastro-esophageal reflux disease without esophagitis: Secondary | ICD-10-CM | POA: Diagnosis present

## 2015-12-18 DIAGNOSIS — E1165 Type 2 diabetes mellitus with hyperglycemia: Secondary | ICD-10-CM | POA: Diagnosis not present

## 2015-12-18 DIAGNOSIS — F329 Major depressive disorder, single episode, unspecified: Secondary | ICD-10-CM | POA: Diagnosis not present

## 2015-12-18 DIAGNOSIS — Z7982 Long term (current) use of aspirin: Secondary | ICD-10-CM | POA: Diagnosis not present

## 2015-12-18 DIAGNOSIS — Z885 Allergy status to narcotic agent status: Secondary | ICD-10-CM

## 2015-12-18 DIAGNOSIS — K8689 Other specified diseases of pancreas: Secondary | ICD-10-CM | POA: Diagnosis not present

## 2015-12-18 DIAGNOSIS — E872 Acidosis, unspecified: Secondary | ICD-10-CM

## 2015-12-18 DIAGNOSIS — Z7984 Long term (current) use of oral hypoglycemic drugs: Secondary | ICD-10-CM

## 2015-12-18 DIAGNOSIS — B961 Klebsiella pneumoniae [K. pneumoniae] as the cause of diseases classified elsewhere: Secondary | ICD-10-CM | POA: Diagnosis present

## 2015-12-18 DIAGNOSIS — E785 Hyperlipidemia, unspecified: Secondary | ICD-10-CM | POA: Diagnosis present

## 2015-12-18 DIAGNOSIS — K819 Cholecystitis, unspecified: Secondary | ICD-10-CM

## 2015-12-18 DIAGNOSIS — Z23 Encounter for immunization: Secondary | ICD-10-CM

## 2015-12-18 DIAGNOSIS — E876 Hypokalemia: Secondary | ICD-10-CM | POA: Diagnosis not present

## 2015-12-18 DIAGNOSIS — IMO0001 Reserved for inherently not codable concepts without codable children: Secondary | ICD-10-CM | POA: Diagnosis present

## 2015-12-18 DIAGNOSIS — R7989 Other specified abnormal findings of blood chemistry: Secondary | ICD-10-CM | POA: Diagnosis not present

## 2015-12-18 DIAGNOSIS — E1122 Type 2 diabetes mellitus with diabetic chronic kidney disease: Secondary | ICD-10-CM | POA: Diagnosis present

## 2015-12-18 DIAGNOSIS — F32A Depression, unspecified: Secondary | ICD-10-CM | POA: Diagnosis present

## 2015-12-18 DIAGNOSIS — R81 Glycosuria: Secondary | ICD-10-CM | POA: Diagnosis not present

## 2015-12-18 DIAGNOSIS — A4159 Other Gram-negative sepsis: Principal | ICD-10-CM | POA: Diagnosis present

## 2015-12-18 DIAGNOSIS — R945 Abnormal results of liver function studies: Secondary | ICD-10-CM

## 2015-12-18 DIAGNOSIS — Z79899 Other long term (current) drug therapy: Secondary | ICD-10-CM

## 2015-12-18 DIAGNOSIS — I130 Hypertensive heart and chronic kidney disease with heart failure and stage 1 through stage 4 chronic kidney disease, or unspecified chronic kidney disease: Secondary | ICD-10-CM | POA: Diagnosis present

## 2015-12-18 DIAGNOSIS — R509 Fever, unspecified: Secondary | ICD-10-CM | POA: Diagnosis not present

## 2015-12-18 DIAGNOSIS — R079 Chest pain, unspecified: Secondary | ICD-10-CM | POA: Diagnosis not present

## 2015-12-18 DIAGNOSIS — K810 Acute cholecystitis: Secondary | ICD-10-CM | POA: Diagnosis not present

## 2015-12-18 DIAGNOSIS — R7881 Bacteremia: Secondary | ICD-10-CM | POA: Diagnosis not present

## 2015-12-18 LAB — BASIC METABOLIC PANEL
Anion gap: 13 (ref 5–15)
BUN: 9 mg/dL (ref 6–20)
CHLORIDE: 103 mmol/L (ref 101–111)
CO2: 22 mmol/L (ref 22–32)
CREATININE: 1.14 mg/dL — AB (ref 0.44–1.00)
Calcium: 9.4 mg/dL (ref 8.9–10.3)
GFR calc non Af Amer: 43 mL/min — ABNORMAL LOW (ref >60)
GFR, EST AFRICAN AMERICAN: 50 mL/min — AB (ref >60)
Glucose, Bld: 259 mg/dL — ABNORMAL HIGH (ref 65–99)
POTASSIUM: 3.7 mmol/L (ref 3.5–5.1)
SODIUM: 138 mmol/L (ref 135–145)

## 2015-12-18 LAB — CBC
HEMATOCRIT: 41.6 % (ref 36.0–46.0)
Hemoglobin: 13.7 g/dL (ref 12.0–15.0)
MCH: 28 pg (ref 26.0–34.0)
MCHC: 32.9 g/dL (ref 30.0–36.0)
MCV: 84.9 fL (ref 78.0–100.0)
PLATELETS: 244 10*3/uL (ref 150–400)
RBC: 4.9 MIL/uL (ref 3.87–5.11)
RDW: 13.7 % (ref 11.5–15.5)
WBC: 11.6 10*3/uL — AB (ref 4.0–10.5)

## 2015-12-18 LAB — HEPATIC FUNCTION PANEL
ALT: 184 U/L — AB (ref 14–54)
AST: 530 U/L — ABNORMAL HIGH (ref 15–41)
Albumin: 3.7 g/dL (ref 3.5–5.0)
Alkaline Phosphatase: 173 U/L — ABNORMAL HIGH (ref 38–126)
BILIRUBIN DIRECT: 1.8 mg/dL — AB (ref 0.1–0.5)
BILIRUBIN INDIRECT: 1.1 mg/dL — AB (ref 0.3–0.9)
TOTAL PROTEIN: 6.8 g/dL (ref 6.5–8.1)
Total Bilirubin: 2.9 mg/dL — ABNORMAL HIGH (ref 0.3–1.2)

## 2015-12-18 LAB — I-STAT CG4 LACTIC ACID, ED
LACTIC ACID, VENOUS: 4.37 mmol/L — AB (ref 0.5–1.9)
LACTIC ACID, VENOUS: 3.65 mmol/L — AB (ref 0.5–1.9)
Lactic Acid, Venous: 2 mmol/L (ref 0.5–1.9)

## 2015-12-18 LAB — URINALYSIS, ROUTINE W REFLEX MICROSCOPIC
Bilirubin Urine: NEGATIVE
GLUCOSE, UA: 500 mg/dL — AB
Hgb urine dipstick: NEGATIVE
KETONES UR: NEGATIVE mg/dL
LEUKOCYTES UA: NEGATIVE
NITRITE: NEGATIVE
PROTEIN: NEGATIVE mg/dL
Specific Gravity, Urine: 1.01 (ref 1.005–1.030)
pH: 6 (ref 5.0–8.0)

## 2015-12-18 LAB — APTT: aPTT: 29 seconds (ref 24–36)

## 2015-12-18 LAB — PROTIME-INR
INR: 1.14
Prothrombin Time: 14.7 seconds (ref 11.4–15.2)

## 2015-12-18 LAB — I-STAT TROPONIN, ED: Troponin i, poc: 0 ng/mL (ref 0.00–0.08)

## 2015-12-18 LAB — TYPE AND SCREEN
ABO/RH(D): A POS
ANTIBODY SCREEN: NEGATIVE

## 2015-12-18 LAB — LIPASE, BLOOD: LIPASE: 26 U/L (ref 11–51)

## 2015-12-18 MED ORDER — SODIUM CHLORIDE 0.9 % IV BOLUS (SEPSIS)
1000.0000 mL | Freq: Once | INTRAVENOUS | Status: AC
  Administered 2015-12-18: 1000 mL via INTRAVENOUS

## 2015-12-18 MED ORDER — FENTANYL CITRATE (PF) 100 MCG/2ML IJ SOLN: 50.0000 ug | Freq: Once | INTRAMUSCULAR | Status: DC

## 2015-12-18 MED ORDER — VANCOMYCIN HCL IN DEXTROSE 1-5 GM/200ML-% IV SOLN
1000.0000 mg | Freq: Once | INTRAVENOUS | Status: AC
  Administered 2015-12-18: 1000 mg via INTRAVENOUS
  Filled 2015-12-18: qty 200

## 2015-12-18 MED ORDER — SODIUM CHLORIDE 0.9 % IV BOLUS (SEPSIS)
300.0000 mL | Freq: Once | INTRAVENOUS | Status: AC
  Administered 2015-12-18: 300 mL via INTRAVENOUS

## 2015-12-18 MED ORDER — ONDANSETRON HCL 4 MG/2ML IJ SOLN: 4.0000 mg | Freq: Four times a day (QID) | INTRAMUSCULAR | Status: DC | PRN

## 2015-12-18 MED ORDER — PIPERACILLIN-TAZOBACTAM 3.375 G IVPB 30 MIN
3.3750 g | Freq: Once | INTRAVENOUS | Status: AC
  Administered 2015-12-18: 3.375 g via INTRAVENOUS
  Filled 2015-12-18: qty 50

## 2015-12-18 MED ORDER — SODIUM CHLORIDE 0.9% FLUSH
3.0000 mL | Freq: Two times a day (BID) | INTRAVENOUS | Status: DC
  Administered 2015-12-19 – 2015-12-22 (×6): 3 mL via INTRAVENOUS

## 2015-12-18 MED ORDER — IOPAMIDOL (ISOVUE-370) INJECTION 76%
INTRAVENOUS | Status: AC
  Filled 2015-12-18: qty 100

## 2015-12-18 MED ORDER — IOPAMIDOL (ISOVUE-370) INJECTION 76%
INTRAVENOUS | Status: AC
  Administered 2015-12-18: 80 mL
  Filled 2015-12-18: qty 100

## 2015-12-18 MED ORDER — FENTANYL CITRATE (PF) 100 MCG/2ML IJ SOLN
50.0000 ug | Freq: Once | INTRAMUSCULAR | Status: AC
  Administered 2015-12-18: 50 ug via INTRAVENOUS
  Filled 2015-12-18: qty 2

## 2015-12-18 MED ORDER — ONDANSETRON HCL 4 MG/2ML IJ SOLN
4.0000 mg | Freq: Once | INTRAMUSCULAR | Status: AC
  Administered 2015-12-18: 4 mg via INTRAVENOUS
  Filled 2015-12-18: qty 2

## 2015-12-18 MED ORDER — ONDANSETRON 4 MG PO TBDP
4.0000 mg | ORAL_TABLET | Freq: Once | ORAL | Status: AC | PRN
  Administered 2015-12-18: 4 mg via ORAL

## 2015-12-18 MED ORDER — VANCOMYCIN HCL 10 G IV SOLR
1250.0000 mg | INTRAVENOUS | Status: DC
  Filled 2015-12-18: qty 1250

## 2015-12-18 MED ORDER — ONDANSETRON HCL 4 MG PO TABS
4.0000 mg | ORAL_TABLET | Freq: Four times a day (QID) | ORAL | Status: DC | PRN
  Administered 2015-12-20: 4 mg via ORAL
  Filled 2015-12-18: qty 1

## 2015-12-18 MED ORDER — INSULIN ASPART 100 UNIT/ML ~~LOC~~ SOLN
0.0000 [IU] | Freq: Three times a day (TID) | SUBCUTANEOUS | Status: DC
  Administered 2015-12-19: 7 [IU] via SUBCUTANEOUS
  Administered 2015-12-19: 2 [IU] via SUBCUTANEOUS
  Administered 2015-12-19: 3 [IU] via SUBCUTANEOUS
  Administered 2015-12-20: 1 [IU] via SUBCUTANEOUS
  Administered 2015-12-20 – 2015-12-21 (×2): 2 [IU] via SUBCUTANEOUS
  Administered 2015-12-22: 5 [IU] via SUBCUTANEOUS
  Administered 2015-12-22 (×2): 3 [IU] via SUBCUTANEOUS
  Administered 2015-12-23: 2 [IU] via SUBCUTANEOUS
  Administered 2015-12-23: 3 [IU] via SUBCUTANEOUS

## 2015-12-18 MED ORDER — PIPERACILLIN-TAZOBACTAM 3.375 G IVPB
3.3750 g | Freq: Three times a day (TID) | INTRAVENOUS | Status: DC
  Administered 2015-12-19 – 2015-12-20 (×4): 3.375 g via INTRAVENOUS
  Filled 2015-12-18 (×6): qty 50

## 2015-12-18 MED ORDER — POTASSIUM CHLORIDE IN NACL 20-0.9 MEQ/L-% IV SOLN
INTRAVENOUS | Status: DC
  Administered 2015-12-19: 01:00:00 via INTRAVENOUS
  Filled 2015-12-18: qty 1000

## 2015-12-18 MED ORDER — FAMOTIDINE IN NACL 20-0.9 MG/50ML-% IV SOLN
20.0000 mg | Freq: Two times a day (BID) | INTRAVENOUS | Status: DC
  Administered 2015-12-19 – 2015-12-22 (×8): 20 mg via INTRAVENOUS
  Filled 2015-12-18 (×11): qty 50

## 2015-12-18 NOTE — ED Triage Notes (Signed)
Pt states 3 hours ago she started having epigastric pain and nausea. Pt states unable to describe pain "just hurts". Pt is warm and dry.

## 2015-12-18 NOTE — ED Provider Notes (Signed)
[facility] DEPT Provider Note  CSN: [id number] Arrival Date & Time: 12/18/15 @ 1353  History    Chief Complaint Chief Complaint  Patient presents with  . Abdominal Pain    HPI [name] is a 82 y.o. female. Presents for abdominal pain located at the epigastrium. Associated w/ nausea but no emesis. Onset at  Around 1030 this AM. Constant pain. Boring pain, tearing pain that radiates to the back.  Patient denies fevers, urinary symptoms or headache or neck pain. Did have back pain and SOB with lightheadedness. Takes ASA 81 mg q day but did not today. Took no nitro SL today as she denies CP.  In March of this year patient was evaluated for similar pain and had extensive imaging of abdomen right upper quadrant gallbladder and pancreas along with liver that per the patient and daughter had stable pancreatic cyst and "abnormal findings of liver".  Past Medical & Surgical History    Past Medical History:  Diagnosis Date  . Chest pain    a. 07/2014 Neg MV, nl EF.  Marland Kitchen Chronic interstitial cystitis   . Chronic low back pain   . CKD (chronic kidney disease), stage III   . Diabetes mellitus without complication ([place])   . GERD (gastroesophageal reflux disease)   . Hyperlipidemia   . Hypertension   . Macular degeneration   . Microhematuria   . Osteopenia   . Pernicious anemia   . Postlaminectomy syndrome   . PVC (premature ventricular contraction)   . Right carpal tunnel syndrome    Patient Active Problem List   Diagnosis Date Noted  . Abnormal LFTs 12/18/2015  . Depression 12/18/2015  . Pancreatitis, acute 02/13/2015  . Acute kidney injury superimposed on chronic kidney disease ([place]) 02/13/2015  . Cholangitis 02/03/2015  . Sepsis ([place]) 02/03/2015  . Essential hypertension, malignant 01/14/2015  . CHF (congestive heart failure) ([place]) 01/14/2015  . Acute on chronic diastolic CHF (congestive heart failure), NYHA class 1 ([place]) 01/13/2015  . Atrial flutter, paroxysmal ([place])  01/13/2015  . Acute respiratory failure with hypoxia ([place]) 01/13/2015  . Lingular mass 01/13/2015  . Ulcer of right heel ([place]) 01/13/2015  . Acute diastolic heart failure, NYHA class 1 ([place]) 01/13/2015  . Hypertensive emergency   . Acute encephalopathy 11/19/2014  . UTI (urinary tract infection) 11/19/2014  . TIA (transient ischemic attack) 11/18/2014  . Obesity (BMI 35.0-39.9 without comorbidity) ([place]) 11/18/2014  . ALT (SGPT) level raised 11/18/2014  . Chronic diastolic heart failure ([place]) 07/20/2014  . CKD (chronic kidney disease), stage III   . Uncontrolled hypertension 07/18/2014  . Type 2 diabetes mellitus with hyperglycemia ([place]) 07/18/2014  . Hyperlipidemia 07/18/2014   Past Surgical History:  Procedure Laterality Date  . BACK SURGERY    . EUS N/A 02/23/2015   Procedure: ESOPHAGEAL ENDOSCOPIC ULTRASOUND (EUS) RADIAL;  Surgeon: Arta Silence, MD;  Location: [facility] END[facility]COPY;  Service: Endoscopy;  Laterality: N/A;  . JOINT REPLACEMENT Left    hip and knee    Family & Social History    Family History  Problem Relation Age of Onset  . Arthritis Mother   . Heart attack Father    Social History  Substance Use Topics  . Smoking status: Never Smoker  . Smokeless tobacco: Never Used  . Alcohol use No    Home Medications    Prior to Admission medications   Medication Sig Start Date End Date Taking? Authorizing Provider  acetaminophen (TYLENOL) 500 MG tablet Take 500 mg by mouth every  6 (six) hours as needed for mild pain.   Yes Historical Provider, MD  amLODipine (NORVASC) 5 MG tablet Take 1 tablet (5 mg total) by mouth daily. 11/21/14  Yes Ripudeep Krystal Eaton, MD  aspirin EC 81 MG tablet Take 81 mg by mouth daily.   Yes Historical Provider, MD  atorvastatin (LIPITOR) 10 MG tablet Take 5 mg by mouth daily.    Yes Historical Provider, MD  cholecalciferol (VITAMIN D) 1000 UNITS tablet Take 2,000 Units by mouth daily.    Yes Historical Provider, MD  conjugated estrogens (PREMARIN)  vaginal cream Place 1 Applicatorful vaginally daily as needed (burning).    Yes Historical Provider, MD  cyanocobalamin (,VITAMIN B-12,) 1000 MCG/ML injection Inject 1,000 mcg into the muscle every 30 (thirty) days.   Yes Historical Provider, MD  furosemide (LASIX) 20 MG tablet Take 2 tablets (40 mg total) by mouth daily. Patient taking differently: Take 20 mg by mouth daily.  02/08/15  Yes Domenic Polite, MD  glimepiride (AMARYL) 1 MG tablet Take 2 tablets (2 mg total) by mouth every morning. 01/17/15  Yes Charlynne Cousins, MD  lisinopril (PRINIVIL,ZESTRIL) 20 MG tablet Take 1 tablet (20 mg total) by mouth every evening. 07/22/14  Yes Maryann Mikhail, DO  lithium carbonate 150 MG capsule Take 150 mg by mouth daily.   Yes Historical Provider, MD  metoprolol (LOPRESSOR) 50 MG tablet Take 1 tablet (50 mg total) by mouth 2 (two) times daily. 07/22/14  Yes Maryann Mikhail, DO  mirtazapine (REMERON) 30 MG tablet Take 30 mg by mouth at bedtime.    Yes Historical Provider, MD  nitroGLYCERIN (NITROSTAT) 0.4 MG SL tablet Place 1 tablet (0.4 mg total) under the tongue every 5 (five) minutes as needed for chest pain. 07/22/14  Yes Maryann Mikhail, DO  omeprazole (PRILOSEC) 40 MG capsule Take 40 mg by mouth every evening.    Yes Historical Provider, MD  oxybutynin (DITROPAN) 5 MG tablet Take 5 mg by mouth 2 (two) times daily.    Yes Historical Provider, MD  pregabalin (LYRICA) 75 MG capsule Take 75 mg by mouth 2 (two) times daily.   Yes Historical Provider, MD  traMADol (ULTRAM) 50 MG tablet Take 1 tablet (50 mg total) by mouth 2 (two) times daily as needed (pain). Patient taking differently: Take 50 mg by mouth 2 (two) times daily.  07/22/14  Yes Maryann Mikhail, DO  Vilazodone HCl 20 MG TABS Take 20 mg by mouth daily. Viibryd   Yes Historical Provider, MD  zolpidem (AMBIEN) 5 MG tablet Take 2.5 mg by mouth at bedtime.    Yes Historical Provider, MD  aspirin EC 325 MG EC tablet Take 1 tablet (325 mg total) by  mouth daily. Patient not taking: Reported on 12/18/2015 11/21/14   Ripudeep Krystal Eaton, MD  levofloxacin (LEVAQUIN) 750 MG tablet Take 1 tablet (750 mg total) by mouth every other day. For 9 days Patient not taking: Reported on 12/18/2015 02/08/15   Domenic Polite, MD    Allergies    Codeine  I reviewed & agree with nursing's documentation on the patient's past medical, surgical, social & family histories as well as their allergies.  Review of Systems  Complete ROS obtained, and is negative except as stated in HPI.  Physical Exam  Updated Vital Signs BP (!) 150/51 (BP Location: Left Arm)   Pulse 68   Temp 98.6 F (37 C)   Resp 21   Ht 5\' 4"  (1.626 m)   Wt 75.8 kg  SpO2 99%   BMI 28.67 kg/m  I have reviewed the triage vital signs and the nursing notes. Physical Exam CONST: Patient oriented to person, place and time, in mild to moderate distress, ill-appearing.  EYES: PERRLA. EOMI. Conjunctiva w/o d/c. Lids AT w/o swelling.  ENMT: External Nares & Ears AT w/o swelling. Oropharynx patent. MM dry.  NECK: ROM full w/o rigidity. Trachea midline. JVD absent.  CVS: +S1/S2 w/o obvious murmur. Lower extremities w/o pitting edema.  RESP: Respiratory effort unlabored w/o retractions & accessory muscle use. BS clear bilaterally.  GI: Soft & ND. +BS x 4. TTP present in epigastrium. Murphy's Sign negative. Hernia absent. Guarding & Rebound absent.  BACK: CVA TTP absent bilaterally.  SKIN: Skin warm & dry. Turgor good. No rash.  PSYCH: Alert. Oriented. Affect and mood appropriate.  NEURO: CN II-XII grossly intact. Motor exam symmetric w/ upper & lower extremities 5/5 bilaterally. Sensation grossly intact.  MSK: Joints located & stable, w/o obvious dislocation & obvious deformity or crepitus absent w/ Cap refill < 2 sec. Peripheral pulses 2+ & equal in all extremities.   ED Treatments & Results   Labs (only abnormal results are displayed) Labs Reviewed  BASIC METABOLIC PANEL - Abnormal;  Notable for the following:       Result Value   Glucose, Bld 259 (*)    Creatinine, Ser 1.14 (*)    GFR calc non Af Amer 43 (*)    GFR calc Af Amer 50 (*)    All other components within normal limits  CBC - Abnormal; Notable for the following:    WBC 11.6 (*)    All other components within normal limits  URINALYSIS, ROUTINE W REFLEX MICROSCOPIC (NOT AT [facility]) - Abnormal; Notable for the following:    APPearance CLOUDY (*)    Glucose, UA 500 (*)    All other components within normal limits  HEPATIC FUNCTION PANEL - Abnormal; Notable for the following:    AST 530 (*)    ALT 184 (*)    Alkaline Phosphatase 173 (*)    Total Bilirubin 2.9 (*)    Bilirubin, Direct 1.8 (*)    Indirect Bilirubin 1.1 (*)    All other components within normal limits  LACTIC ACID, PLASMA - Abnormal; Notable for the following:    Lactic Acid, Venous 2.1 (*)    All other components within normal limits  I-STAT CG4 LACTIC ACID, ED - Abnormal; Notable for the following:    Lactic Acid, Venous 4.37 (*)    All other components within normal limits  I-STAT CG4 LACTIC ACID, ED - Abnormal; Notable for the following:    Lactic Acid, Venous 3.65 (*)    All other components within normal limits  I-STAT CG4 LACTIC ACID, ED - Abnormal; Notable for the following:    Lactic Acid, Venous 2.00 (*)    All other components within normal limits  URINE CULTURE  CULTURE, BLOOD (ROUTINE X 2)  CULTURE, BLOOD (ROUTINE X 2)  APTT  PROTIME-INR  LIPASE, BLOOD  LITHIUM LEVEL  CBC WITH DIFFERENTIAL/PLATELET  COMPREHENSIVE METABOLIC PANEL  LACTIC ACID, PLASMA  I-STAT TROPOININ, ED  I-STAT CG4 LACTIC ACID, ED  TYPE AND SCREEN    EKG    EKG Interpretation  Date/Time:    Ventricular Rate:    PR Interval:    QRS Duration:   QT Interval:    QTC Calculation:   R Axis:     Text Interpretation:  Radiology Dg Chest Portable 1 View  Result Date: 12/18/2015 CLINICAL DATA:  Chest pain.  Concern for dissection.  EXAM: PORTABLE CHEST 1 VIEW COMPARISON:  02/06/2015 FINDINGS: Heart size mildly enlarged. Negative for heart failure. Mild streaky markings in the bases most consistent with scarring or atelectasis. Negative for pneumonia or effusion. Atherosclerotic calcification in the aorta. Mediastinum not widened. IMPRESSION: Mild bibasilar atelectasis/ scarring. Electronically Signed   By: Franchot Gallo M.D.   On: 12/18/2015 18:26   Ct Angio Chest/abd/pel For Dissection W And/or Wo Contrast  Result Date: 12/18/2015 CLINICAL DATA:  Chest pain.  Incontinence.  Concern for dissection. EXAM: CT ANGIOGRAPHY CHEST, ABDOMEN AND PELVIS TECHNIQUE: Multidetector CT imaging through the chest, abdomen and pelvis was performed using the standard protocol during bolus administration of intravenous contrast. Multiplanar reconstructed images and MIPs were obtained and reviewed to evaluate the vascular anatomy. CONTRAST:  80 cc Isovue 370 intravenous COMPARISON:  MRCP 06/19/2015. FINDINGS: CTA CHEST FINDINGS Cardiovascular: Noncontrast phase shows no intramural hematoma in the aorta. No aortic dissection, aneurysm, or stenosis. Atherosclerosis, including the aorta and coronary arteries. No cardiomegaly. No pericardial effusion. Limited evaluation the pulmonary arteries due to motion. No central filling defect identified. Mediastinum/Nodes: No adenopathy.  Negative esophagus Lungs/Pleura: Motion degraded evaluation. Mild atelectasis. There is no edema, consolidation, effusion, or pneumothorax. Musculoskeletal: See below Review of the MIP images confirms the above findings. CTA ABDOMEN AND PELVIS FINDINGS VASCULAR Aorta: Diffuse atherosclerosis.  No aneurysm or stenosis. Celiac: High-grade narrowing, with narrowing accentuated by median arcuate ligament given the morphology. Mild hypertrophy of peripancreatic collaterals. SMA: Atherosclerosis without stenosis. Renals: Atherosclerosis without suspected flow limiting stenosis. Single  bilateral renal arteries. IMA: Patent Inflow: Atheromatous without stenosis or occlusion.  No aneurysm. Veins: No obvious venous abnormality within the limitations of this arterial phase study. Review of the MIP images confirms the above findings. NON-VASCULAR Hepatobiliary: No focal liver abnormality.Chronic dilatation of the intrahepatic and extrahepatic biliary tree without visible mass or choledocholithiasis. The common bile duct measures up to 19 mm. The gallbladder is distended and there is mild pericholecystic fluid, similar to 02/05/2015 MRCP. Pancreas: Chronic prominence of the main pancreatic duct measuring up to 4-5 mm. Unilocular pancreatic head cyst measuring 24 mm, which had layering secretions on previous MRI. Punctate calcifications seen dependently today. Spleen: Unremarkable. Adrenals/Urinary Tract: Negative adrenals. No hydronephrosis or stone. Simple appearing left renal cyst. The bladder appears thick walled with mild perivesicular stranding. Sensitivity is diminished by streak artifact from left hip prosthesis. Stomach/Bowel: Extensive distal colonic diverticulosis without active inflammation. No bowel obstruction. No appendicitis. Mid duodenal diverticulum wrapping behind the pancreatic head. Lymphatic:   No mass or adenopathy. Reproductive:No pathologic findings. Other: No ascites or pneumoperitoneum. Musculoskeletal: Severe lumbar disc degeneration with extensive sclerosis and endplate irregularity. Remote L1 superior endplate fracture. No acute fracture noted. Total left hip arthroplasty without adverse finding. Review of the MIP images confirms the above findings. IMPRESSION: 1. Negative for aortic dissection or other acute arterial finding. 2. Suspect cystitis.  Correlate with urinalysis. 3. Chronic biliary tree dilatation. Reference MRCP 06/19/2015. There is mild pericholecystic fluid, raising the possibility of cholecystitis, but this appearance was also present on December 2016 MRCP.  4. Unilocular, benign-appearing pancreatic head mass that is unchanged from MRCP. 5. 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 Electronically Signed   By: Monte Fantasia M.D.   On: 12/18/2015 20:42    Pertinent labs & imaging results that were available during my care  of the patient were independently visualized by me and considered in my medical decision making, please see chart for details. Formal interpretation provided by Radiology.  Procedures (including critical care time) Procedures  Medications Ordered in ED Medications  piperacillin-tazobactam (ZOSYN) IVPB 3.375 g (not administered)  vancomycin (VANCOCIN) 1,250 mg in sodium chloride 0.9 % 250 mL IVPB (not administered)  fentaNYL (SUBLIMAZE) injection 50 mcg (0 mcg Intravenous Hold 12/19/15 0000)  famotidine (PEPCID) IVPB 20 mg premix (not administered)  sodium chloride flush (NS) 0.9 % injection 3 mL (3 mLs Intravenous Given 12/19/15 0121)  0.9 % NaCl with KCl 20 mEq/ L  infusion ( Intravenous New Bag/Given 12/19/15 0121)  ondansetron (ZOFRAN) tablet 4 mg (not administered)    Or  ondansetron (ZOFRAN) injection 4 mg (not administered)  insulin aspart (novoLOG) injection 0-9 Units (not administered)  fentaNYL (SUBLIMAZE) injection 25 mcg (not administered)  metoprolol (LOPRESSOR) injection 5 mg (not administered)  ondansetron (ZOFRAN-ODT) disintegrating tablet 4 mg (4 mg Oral Given 12/18/15 1533)  fentaNYL (SUBLIMAZE) injection 50 mcg (50 mcg Intravenous Given 12/18/15 1802)  iopamidol (ISOVUE-370) 76 % injection (80 mLs  Contrast Given 12/18/15 1923)  sodium chloride 0.9 % bolus 1,000 mL (0 mLs Intravenous Stopped 12/18/15 2000)  sodium chloride 0.9 % bolus 1,000 mL (0 mLs Intravenous Stopped 12/18/15 2123)  sodium chloride 0.9 % bolus 300 mL (0 mLs Intravenous Stopped 12/18/15 2028)  piperacillin-tazobactam (ZOSYN) IVPB 3.375 g (0 g Intravenous  Stopped 12/18/15 2028)  vancomycin (VANCOCIN) IVPB 1000 mg/200 mL premix (0 mg Intravenous Stopped 12/18/15 2046)  fentaNYL (SUBLIMAZE) injection 50 mcg (50 mcg Intravenous Given 12/18/15 2026)  ondansetron (ZOFRAN) injection 4 mg (4 mg Intravenous Given 12/18/15 2026)  gadobenate dimeglumine (MULTIHANCE) injection 15 mL (15 mLs Intravenous Contrast Given 12/19/15 0336)    Initial Impression & Plan / ED Course & Results / Final Disposition   Initial Impression & Plan Patient presents for epigastric abdominal pain onset of this morning described as tearing and radiating to the back. Patient endorses no chest pain however does have shortness of breath. Had recent evaluation for similar endorsements in March of this year where she had extensive imaging of abdomen which included MR abdomen with and without contrast and MRCP on 06/19/15 that showed stable injury and extrahepatic biliary duct dilation stable over previous MRI 4 months previous, along with subcapsular enhancement of upper liver without underlying lesion and 2.7 cm cyst in the head of the pancreas with protein versus hemorrhagic debris. Patient had abdominal aortic atherosclerosis with simple left-sided renal cyst.   Per review of patient's chart patient had echo complete on 11/19/14 which showed normal left ventricular EF without evidence of hypokinesia.  Due to patient's significant epigastric pain that radiates to back consideration includes ACS therefore I obtained EKG and troponin. Troponin unremarkable. ECG reveals patient has no evidence of acute STEMI at this time however EKG is consistent with junctional rhythm in QRS and QTc and axis within normal limits.  ED Course & Results Due to tachycardia and rectal temp of 100.2 in setting of elevated lactic acidosis consideration for sepsis at this time therefore blood cultures and urine cultures were obtained along with empirically covering the patient with vancomycin and Zosyn and 30 mL  per KG of normal saline.  Final Disposition Reassessment of the patient reveals the patient appears w/o concern for HD instability however remains symptomatic and therefore due to concerns of intra-abdominal process in patient with high risk for life-threatening emergency I believe the patient  will require admission for continued medical intervention.   To reevaluate the patient's Sepsis, serial exams of the patient's perfusion status & limited bedside ultrasound images were used to reassess their fluid responsiveness and volume status at .  Initial fluid resuscitation bolus is continuing finished.  Physical Examination: Vitals:   12/19/15 0145 12/19/15 0332  BP: (!) 141/47 (!) 150/51  Pulse: 69 68  Resp: 21 21  Temp:  98.6 F (37 C)   Respiratory: WOB normal. No rales. CV: normal rate and regular rhythm, w/o obvious murmur. Extremities: Pulses intact and capillary refill <2 sec. Skin: normal turgor.  Procedure: Emergency Focused Ultrasound Exam Limited Ultrasound evaluation of the Heart & Pericardium & IVC. Date: 12/18/15 / Time: 2007 Performed and interpreted by Voncille Lo, MD, at the bedside. Authorizing Provider, Dr. Ellender Hose, also reviewed the images.  Indication: Hypotension, Sepsis.  Views Obtained: Subcostal 4 chamber and Inferior vena cava.  Images were archived per Department policy. Study was limited by this being an emergent procedure, the patient's body habitus, & discomfort/pain.  Findings: No pericardial effusion. Normal contractility. IVC normal. Tamponade physiology absent.  Interpretation: Cardiac activity present. Pericardial effusion absent. Tamponade absent. Volume status normal. Normal contractility.  CPT Code(s): L3824933 (Limited Transthoracic Cardiac)  I believe the patient has responded adequately to fluid resuscitation. Patient does not meet criteria for Septic Shock, therefore did not require Vasopressors.  Patient will likely require  consultation from GI in AM however no emergent indication for consultation of GI in the ED at this time per CT abdomen and pelvis that showed concern for biliary disease w/o progression from previous studies. ED Course in its entirety was reviewed w/ the patient and relative(s). I therefore consulted the Hospitalist Service for admission & we discussed the patient's ED course & they have agreed to admit. They request no further interventions prior to the patient's transport from the ED. Patient stable for transport. Level of Care determined by the Admitting Service.   Final Clinical Impression & ED Diagnoses   1. Sepsis, due to unspecified organism ([place])   2. Fever, unspecified fever cause   3. Lactic acidosis   4. Abnormal LFTs    Patient care discussed with the attending physician, Dr. Ellender Hose, who oversaw their evaluation & treatment & voiced agreement.  Note: This document was prepared using Dragon voice recognition software and may include unintentional dictation errors.  House Officer: Voncille Lo, MD, Emergency Medicine Resident.   Voncille Lo, MD 12/19/15 [place], MD 12/19/15 [place], MD 12/19/15 1200

## 2015-12-18 NOTE — ED Notes (Signed)
Res notified of elevated CG-4

## 2015-12-18 NOTE — Progress Notes (Signed)
Pharmacy Antibiotic Note  [name] is a 82 y.o. female admitted on 12/18/2015 with sepsis.  Pharmacy has been consulted for Vancomycin and Zosyn dosing. First doses were already ordered in the ED.  Plan: Vancomycin 1250mg  IV q24h Zosyn 3.375g IV q8h extended infusion Monitor renal function Follow available micro data  Height: 5\' 4"  (162.6 cm) Weight: 167 lb (75.8 kg) IBW/kg (Calculated) : 54.7  Temp (24hrs), Avg:99.1 F (37.3 C), Min:98 F (36.7 C), Max:100.2 F (37.9 C)   Recent Labs Lab 12/18/15 1435 12/18/15 1806  WBC 11.6*  --   CREATININE 1.14*  --   LATICACIDVEN  --  4.37*    Estimated Creatinine Clearance: 36.6 mL/min (by C-G formula based on SCr of 1.14 mg/dL (H)).    Allergies  Allergen Reactions  . Codeine Other (See Comments)    slurred speech     Thank you for allowing pharmacy to be a part of this patient's care.  Norva Riffle 12/18/2015 7:08 PM

## 2015-12-18 NOTE — ED Notes (Signed)
Nurse drawing labs. 

## 2015-12-18 NOTE — H&P (Signed)
History and Physical    MADDY SCHULZ [id number] DOB: 1929/11/09 DOA: 12/18/2015  PCP: Irven Shelling, MD   Patient coming from: Home.  Chief Complaint: Abdominal pain.  HPI: [name] is a 82 y.o. female with medical history significant of CP, chronic interstitial cystitis, Chronic back pain (postlaminectomy syndrome),stage 3 CKD, type 2 diabetes, GERD, hyperlipidemia, hypertension, macular degeneration, microhematuria, osteopenia, pernicious anemia, right carpal tunnel syndrome who is coming to the emergency department due to abdominal pain associated with nausea, emesis and fever.  Per patient's daughter, she started having epigastric pain associated with nausea about 10:30 AM. The pain is dull, constant and radiates to her back similar to her previous episodes of cholangitis. The patient has had low-grade fevers, chills, fatigue and malaise. He denies diarrhea, constipation, melena or hematochezia.  She complains of occasional suprapubic pain, but no dysuria. She has a history of interstitial cystitis. Her urinalysis was normal.  ED Course: The patient received 3 rounds of fentanyl 50 g IV P4 pain, 4 mg of Zofran by mouth and 4 mg IVP, a 2300 mL and is bolus, vancomycin and Zosyn per pharmacy.  Lab work shows a WBC 11.6, hemoglobin 13.7 g/dL, lactic acid of 4.37 mmol/L. AST 530, ALT 184, lipase 26, alkaline phosphatase 173 units. Blood glucose was 259, total bilirubin 2.9 and direct bilirubin 1.8 mg/dL. urine analysis showed glucosuria.  Imaging showed mild bibasilar atelectasis, chronic prominence of the main pancreatic duct at 4-5 mL and unilocular pancreatic head cyst which are basically unchanged from MRCP 2 done in April and December last year.  Review of Systems: As per HPI otherwise 10 point review of systems negative.    Past Medical History:  Diagnosis Date  . Chest pain    a. 07/2014 Neg MV, nl EF.  Marland Kitchen Chronic interstitial cystitis   . Chronic low back pain   .  CKD (chronic kidney disease), stage III   . Diabetes mellitus without complication ([place])   . GERD (gastroesophageal reflux disease)   . Hyperlipidemia   . Hypertension   . Macular degeneration   . Microhematuria   . Osteopenia   . Pernicious anemia   . Postlaminectomy syndrome   . PVC (premature ventricular contraction)   . Right carpal tunnel syndrome     Past Surgical History:  Procedure Laterality Date  . BACK SURGERY    . EUS N/A 02/23/2015   Procedure: ESOPHAGEAL ENDOSCOPIC ULTRASOUND (EUS) RADIAL;  Surgeon: Arta Silence, MD;  Location: [facility];  Service: Endoscopy;  Laterality: N/A;  . JOINT REPLACEMENT Left    hip and knee     reports that she has never smoked. She has never used smokeless tobacco. She reports that she does not drink alcohol or use drugs.  Allergies  Allergen Reactions  . Codeine Other (See Comments)    slurred speech    Family History  Problem Relation Age of Onset  . Arthritis Mother   . Heart attack Father     Prior to Admission medications   Medication Sig Start Date End Date Taking? Authorizing Provider  acetaminophen (TYLENOL) 500 MG tablet Take 500 mg by mouth every 6 (six) hours as needed for mild pain.    Historical Provider, MD  amLODipine (NORVASC) 5 MG tablet Take 1 tablet (5 mg total) by mouth daily. 11/21/14   Ripudeep Krystal Eaton, MD  aspirin EC 325 MG EC tablet Take 1 tablet (325 mg total) by mouth daily. 11/21/14   Ripudeep Krystal Eaton, MD  atorvastatin (LIPITOR) 10 MG tablet Take 5 mg by mouth daily.     Historical Provider, MD  cholecalciferol (VITAMIN D) 1000 UNITS tablet Take 2,000 Units by mouth daily.     Historical Provider, MD  conjugated estrogens (PREMARIN) vaginal cream Place 1 Applicatorful vaginally daily as needed (burning).     Historical Provider, MD  cyanocobalamin (,VITAMIN B-12,) 1000 MCG/ML injection Inject 1,000 mcg into the muscle every 30 (thirty) days.    Historical Provider, MD  furosemide (LASIX) 20 MG tablet  Take 2 tablets (40 mg total) by mouth daily. 02/08/15   Domenic Polite, MD  glimepiride (AMARYL) 1 MG tablet Take 2 tablets (2 mg total) by mouth every morning. Patient taking differently: Take 2 mg by mouth daily with breakfast.  01/17/15   Charlynne Cousins, MD  levofloxacin (LEVAQUIN) 750 MG tablet Take 1 tablet (750 mg total) by mouth every other day. For 9 days Patient taking differently: Take 750 mg by mouth every other day. 9 day course started 02/10/15 02/08/15   Domenic Polite, MD  lisinopril (PRINIVIL,ZESTRIL) 20 MG tablet Take 1 tablet (20 mg total) by mouth every evening. Patient taking differently: Take 20 mg by mouth at bedtime.  07/22/14   Maryann Mikhail, DO  lithium carbonate 150 MG capsule Take 150 mg by mouth daily.    Historical Provider, MD  metoprolol (LOPRESSOR) 50 MG tablet Take 1 tablet (50 mg total) by mouth 2 (two) times daily. 07/22/14   Maryann Mikhail, DO  mirtazapine (REMERON) 30 MG tablet Take 30 mg by mouth at bedtime.     Historical Provider, MD  nitroGLYCERIN (NITROSTAT) 0.4 MG SL tablet Place 1 tablet (0.4 mg total) under the tongue every 5 (five) minutes as needed for chest pain. 07/22/14   Maryann Mikhail, DO  omeprazole (PRILOSEC) 40 MG capsule Take 40 mg by mouth at bedtime.     Historical Provider, MD  oxybutynin (DITROPAN) 5 MG tablet Take 5 mg by mouth 2 (two) times daily.     Historical Provider, MD  pregabalin (LYRICA) 75 MG capsule Take 75 mg by mouth 2 (two) times daily.    Historical Provider, MD  traMADol (ULTRAM) 50 MG tablet Take 1 tablet (50 mg total) by mouth 2 (two) times daily as needed (pain). Patient taking differently: Take 50 mg by mouth 2 (two) times daily.  07/22/14   Maryann Mikhail, DO  Vilazodone HCl 20 MG TABS Take 20 mg by mouth daily. Viibryd    Historical Provider, MD  zolpidem (AMBIEN) 5 MG tablet Take 2.5 mg by mouth at bedtime.     Historical Provider, MD    Physical Exam:  Constitutional: NAD, calm, comfortable Vitals:    12/18/15 1900 12/18/15 2003 12/18/15 2100 12/18/15 2115  BP: 141/60 156/59 (!) 155/52 (!) 154/49  Pulse: 89 90 87 85  Resp: 26 (!) 31 19 (!) 27  Temp:  100.2 F (37.9 C)    TempSrc:  Oral    SpO2: 97% 94% 91% 94%  Weight:      Height:       Eyes: PERRL, lids and conjunctivae normal ENMT: Mucous membranes are mildly dry. Posterior pharynx clear of any exudate or lesions. Neck: normal, supple, no masses, no thyromegaly Respiratory: Decreased breath sounds at bases, no wheezing, no crackles. Normal respiratory effort. No accessory muscle use.  Cardiovascular: Regular rate and rhythm, no murmurs / rubs / gallops. No extremity edema. 2+ pedal pulses. No carotid bruits.  Abdomen: No distention. Bowel sounds positive.  Positive epigastric tenderness, no guarding/rebound/masses palpated. No hepatosplenomegaly.  Musculoskeletal: no clubbing / cyanosis. No joint deformity upper and lower extremities. Good ROM, no contractures. Normal muscle tone.  Skin: Hyperpigmented macules on extremities. Small areas of ecchymosis. Healing left lower pretibial area and right heel operations. Neurologic: CN 2-12 grossly intact. Sensation intact, DTR normal. Strength 5/5 in all 4.  Psychiatric: Alert and oriented x 3. Looks fatigued and tired.  Labs on Admission: I have personally reviewed following labs and imaging studies  CBC:  Recent Labs Lab 12/18/15 1435  WBC 11.6*  HGB 13.7  HCT 41.6  MCV 84.9  PLT [id number]   Basic Metabolic Panel:  Recent Labs Lab 12/18/15 1435  NA 138  K 3.7  CL 103  CO2 22  GLUCOSE 259*  BUN 9  CREATININE 1.14*  CALCIUM 9.4   GFR: Estimated Creatinine Clearance: 36.6 mL/min (by C-G formula based on SCr of 1.14 mg/dL (H)). Liver Function Tests:  Recent Labs Lab 12/18/15 1758  AST 530*  ALT 184*  ALKPHOS 173*  BILITOT 2.9*  PROT 6.8  ALBUMIN 3.7    Recent Labs Lab 12/18/15 1758  LIPASE 26   No results for input(s): AMMONIA in the last 168  hours. Coagulation Profile:  Recent Labs Lab 12/18/15 1758  INR 1.14   Cardiac Enzymes: No results for input(s): CKTOTAL, CKMB, CKMBINDEX, TROPONINI in the last 168 hours. BNP (last 3 results) No results for input(s): PROBNP in the last 8760 hours. HbA1C: No results for input(s): HGBA1C in the last 72 hours. CBG: No results for input(s): GLUCAP in the last 168 hours. Lipid Profile: No results for input(s): CHOL, HDL, LDLCALC, TRIG, CHOLHDL, LDLDIRECT in the last 72 hours. Thyroid Function Tests: No results for input(s): TSH, T4TOTAL, FREET4, T3FREE, THYROIDAB in the last 72 hours. Anemia Panel: No results for input(s): VITAMINB12, FOLATE, FERRITIN, TIBC, IRON, RETICCTPCT in the last 72 hours. Urine analysis:    Component Value Date/Time   COLORURINE YELLOW 12/18/2015 1830   APPEARANCEUR CLOUDY (A) 12/18/2015 1830   LABSPEC 1.010 12/18/2015 1830   PHURINE 6.0 12/18/2015 1830   GLUCOSEU 500 (A) 12/18/2015 1830   HGBUR NEGATIVE 12/18/2015 1830   BILIRUBINUR NEGATIVE 12/18/2015 1830   KETONESUR NEGATIVE 12/18/2015 1830   PROTEINUR NEGATIVE 12/18/2015 1830   UROBILINOGEN 0.2 11/20/2014 1405   NITRITE NEGATIVE 12/18/2015 1830   LEUKOCYTESUR NEGATIVE 12/18/2015 1830     Radiological Exams on Admission: Dg Chest Portable 1 View  Result Date: 12/18/2015 CLINICAL DATA:  Chest pain.  Concern for dissection. EXAM: PORTABLE CHEST 1 VIEW COMPARISON:  02/06/2015 FINDINGS: Heart size mildly enlarged. Negative for heart failure. Mild streaky markings in the bases most consistent with scarring or atelectasis. Negative for pneumonia or effusion. Atherosclerotic calcification in the aorta. Mediastinum not widened. IMPRESSION: Mild bibasilar atelectasis/ scarring. Electronically Signed   By: Franchot Gallo M.D.   On: 12/18/2015 18:26   Ct Angio Chest/abd/pel For Dissection W And/or Wo Contrast  Result Date: 12/18/2015 CLINICAL DATA:  Chest pain.  Incontinence.  Concern for dissection.  EXAM: CT ANGIOGRAPHY CHEST, ABDOMEN AND PELVIS TECHNIQUE: Multidetector CT imaging through the chest, abdomen and pelvis was performed using the standard protocol during bolus administration of intravenous contrast. Multiplanar reconstructed images and MIPs were obtained and reviewed to evaluate the vascular anatomy. CONTRAST:  80 cc Isovue 370 intravenous COMPARISON:  MRCP 06/19/2015. FINDINGS: CTA CHEST FINDINGS Cardiovascular: Noncontrast phase shows no intramural hematoma in the aorta. No aortic dissection, aneurysm, or stenosis. Atherosclerosis, including the  aorta and coronary arteries. No cardiomegaly. No pericardial effusion. Limited evaluation the pulmonary arteries due to motion. No central filling defect identified. Mediastinum/Nodes: No adenopathy.  Negative esophagus Lungs/Pleura: Motion degraded evaluation. Mild atelectasis. There is no edema, consolidation, effusion, or pneumothorax. Musculoskeletal: See below Review of the MIP images confirms the above findings. CTA ABDOMEN AND PELVIS FINDINGS VASCULAR Aorta: Diffuse atherosclerosis.  No aneurysm or stenosis. Celiac: High-grade narrowing, with narrowing accentuated by median arcuate ligament given the morphology. Mild hypertrophy of peripancreatic collaterals. SMA: Atherosclerosis without stenosis. Renals: Atherosclerosis without suspected flow limiting stenosis. Single bilateral renal arteries. IMA: Patent Inflow: Atheromatous without stenosis or occlusion.  No aneurysm. Veins: No obvious venous abnormality within the limitations of this arterial phase study. Review of the MIP images confirms the above findings. NON-VASCULAR Hepatobiliary: No focal liver abnormality.Chronic dilatation of the intrahepatic and extrahepatic biliary tree without visible mass or choledocholithiasis. The common bile duct measures up to 19 mm. The gallbladder is distended and there is mild pericholecystic fluid, similar to 02/05/2015 MRCP. Pancreas: Chronic prominence of  the main pancreatic duct measuring up to 4-5 mm. Unilocular pancreatic head cyst measuring 24 mm, which had layering secretions on previous MRI. Punctate calcifications seen dependently today. Spleen: Unremarkable. Adrenals/Urinary Tract: Negative adrenals. No hydronephrosis or stone. Simple appearing left renal cyst. The bladder appears thick walled with mild perivesicular stranding. Sensitivity is diminished by streak artifact from left hip prosthesis. Stomach/Bowel: Extensive distal colonic diverticulosis without active inflammation. No bowel obstruction. No appendicitis. Mid duodenal diverticulum wrapping behind the pancreatic head. Lymphatic:   No mass or adenopathy. Reproductive:No pathologic findings. Other: No ascites or pneumoperitoneum. Musculoskeletal: Severe lumbar disc degeneration with extensive sclerosis and endplate irregularity. Remote L1 superior endplate fracture. No acute fracture noted. Total left hip arthroplasty without adverse finding. Review of the MIP images confirms the above findings. IMPRESSION: 1. Negative for aortic dissection or other acute arterial finding. 2. Suspect cystitis.  Correlate with urinalysis. 3. Chronic biliary tree dilatation. Reference MRCP 06/19/2015. There is mild pericholecystic fluid, raising the possibility of cholecystitis, but this appearance was also present on December 2016 MRCP. 4. Unilocular, benign-appearing pancreatic head mass that is unchanged from MRCP. 5. 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 Electronically Signed   By: Monte Fantasia M.D.   On: 12/18/2015 20:42   Echocardiogram 11/19/2014 ------------------------------------------------------------------- LV EF: 60%  ------------------------------------------------------------------- Indications:      CVA 436.  ------------------------------------------------------------------- History:   Risk factors:   Hypertension. Diabetes mellitus.  ------------------------------------------------------------------- Study Conclusions  - Left ventricle: The cavity size was normal. Wall thickness was   increased in a pattern of moderate LVH. The estimated ejection   fraction was 60%. Wall motion was normal; there were no regional   wall motion abnormalities. - Aortic valve: Sclerosis without stenosis. There was no   significant regurgitation. - Right ventricle: The cavity size was normal. Systolic function   was normal. - Pulmonary arteries: PA peak pressure: 39 mm Hg (S). - Pericardium, extracardiac: A trivial pericardial effusion was   identified posterior to the heart.  Impressions:  - No cardiac source of embolism was identified, but cannot be ruled   out on the basis of this examination.  -----------------------------------------------------------------------------------------------------------------  EKG: Independently reviewed. #1 Vent. rate 64 BPM PR interval * ms QRS duration 96 ms QT/QTc 416/429 ms P-R-T axes * -22 191 Junctional rhythm with occasional Premature ventricular complexes Left ventricular hypertrophy with repolarization abnormality Abnormal ECG Per patient's daughter, she was  initially having chills and tremors.   #2 Vent. rate 82 BPM PR interval * ms QRS duration 111 ms QT/QTc 403/471 ms P-R-T axes 72 -21 14 Sinus rhythm Borderline left axis deviation Borderline repolarization abnormality  Assessment/Plan Principal Problem:   Sepsis (HCC) Suspect cholangitis. Admit to telemetry a/inpatient. Continue gentle IV hydration. Continue vancomycin and Zosyn per pharmacy. Follow-up blood cultures and sensitivity. Monitor CBC and CMP daily.  Active Problems:   Abnormal LFTs   Lingular mass Keep nothing by mouth. Continue IV fluids. Hold atorvastatin. Famotidine 20 mg IVP every 12 hours. Analgesics and antiemetics as needed. Check MRCP in  a.m. Consult GI in a.m.    Uncontrolled hypertension Hold antihypertensives for now due to sepsis and nausea. Monitor blood pressure and use PRN IV antihypertensives.    Type 2 diabetes mellitus with hyperglycemia (HCC) CBG monitoring every six hours while nothing by mouth. Sensitive regular insulin sliding scale coverage.    Hyperlipidemia Hold atorvastatin 5 mg by mouth daily.    Chronic diastolic heart failure (HCC) Monitor intake and output. Daily weights. Hold oral metoprolol, lisinopril and furosemide for now. Will continue metoprolol and IV form as needed. Resume once the patient is tolerating oral intake.    CKD (chronic kidney disease), stage III Monitor BUN and creatinine.        Depression Resume lithium carbonate, Remeron and vilazodone once tolerating oral intake.    DVT prophylaxis: SCDs. Code Status: Full code. Family Communication: Her daughter was present in the room. Disposition Plan: Admit for IV hydration, Iv antibiotics and GI evaluation Consults called:  Admission status: Inpatient/Telemetry.   Reubin Milan MD [facility] Pager [phone].  If 7PM-7AM, please contact night-coverage [email or web address] Password [facility]  12/18/2015, 9:39 PM        Pancreatic cystic lesion, slightly bigger than 2012, but otherwise without change, from which time in 2012 cyst aspiration and cytology was done overall results of which were suggestive of mucinous cyst with low-risk aggressor potential based on cyst DNA studies.

## 2015-12-18 NOTE — ED Notes (Signed)
Pt to CT

## 2015-12-19 ENCOUNTER — Inpatient Hospital Stay (HOSPITAL_COMMUNITY): Payer: PPO

## 2015-12-19 DIAGNOSIS — E1165 Type 2 diabetes mellitus with hyperglycemia: Secondary | ICD-10-CM | POA: Diagnosis not present

## 2015-12-19 DIAGNOSIS — K810 Acute cholecystitis: Secondary | ICD-10-CM | POA: Diagnosis not present

## 2015-12-19 DIAGNOSIS — I5032 Chronic diastolic (congestive) heart failure: Secondary | ICD-10-CM | POA: Diagnosis not present

## 2015-12-19 DIAGNOSIS — N183 Chronic kidney disease, stage 3 (moderate): Secondary | ICD-10-CM | POA: Diagnosis not present

## 2015-12-19 DIAGNOSIS — R7881 Bacteremia: Secondary | ICD-10-CM | POA: Diagnosis not present

## 2015-12-19 DIAGNOSIS — R109 Unspecified abdominal pain: Secondary | ICD-10-CM | POA: Diagnosis not present

## 2015-12-19 DIAGNOSIS — A419 Sepsis, unspecified organism: Secondary | ICD-10-CM | POA: Diagnosis not present

## 2015-12-19 LAB — BLOOD CULTURE ID PANEL (REFLEXED)
Acinetobacter baumannii: NOT DETECTED
CANDIDA ALBICANS: NOT DETECTED
CANDIDA KRUSEI: NOT DETECTED
CARBAPENEM RESISTANCE: NOT DETECTED
Candida glabrata: NOT DETECTED
Candida parapsilosis: NOT DETECTED
Candida tropicalis: NOT DETECTED
ENTEROBACTER CLOACAE COMPLEX: NOT DETECTED
ENTEROBACTERIACEAE SPECIES: DETECTED — AB
ENTEROCOCCUS SPECIES: NOT DETECTED
ESCHERICHIA COLI: NOT DETECTED
Haemophilus influenzae: NOT DETECTED
KLEBSIELLA OXYTOCA: NOT DETECTED
Klebsiella pneumoniae: DETECTED — AB
LISTERIA MONOCYTOGENES: NOT DETECTED
Neisseria meningitidis: NOT DETECTED
PSEUDOMONAS AERUGINOSA: NOT DETECTED
Proteus species: NOT DETECTED
STREPTOCOCCUS PNEUMONIAE: NOT DETECTED
STREPTOCOCCUS PYOGENES: NOT DETECTED
Serratia marcescens: NOT DETECTED
Staphylococcus aureus (BCID): NOT DETECTED
Staphylococcus species: NOT DETECTED
Streptococcus agalactiae: NOT DETECTED
Streptococcus species: NOT DETECTED

## 2015-12-19 LAB — GLUCOSE, CAPILLARY
Glucose-Capillary: 310 mg/dL — ABNORMAL HIGH (ref 65–99)
Glucose-Capillary: 242 mg/dL — ABNORMAL HIGH (ref 65–99)
Glucose-Capillary: 160 mg/dL — ABNORMAL HIGH (ref 65–99)
Glucose-Capillary: 115 mg/dL — ABNORMAL HIGH (ref 65–99)

## 2015-12-19 LAB — COMPREHENSIVE METABOLIC PANEL
ALBUMIN: 2.9 g/dL — AB (ref 3.5–5.0)
ALT: 473 U/L — ABNORMAL HIGH (ref 14–54)
AST: 980 U/L — ABNORMAL HIGH (ref 15–41)
Alkaline Phosphatase: 166 U/L — ABNORMAL HIGH (ref 38–126)
Anion gap: 11 (ref 5–15)
BILIRUBIN TOTAL: 3.8 mg/dL — AB (ref 0.3–1.2)
BUN: 10 mg/dL (ref 6–20)
CHLORIDE: 101 mmol/L (ref 101–111)
CO2: 25 mmol/L (ref 22–32)
CREATININE: 1.25 mg/dL — AB (ref 0.44–1.00)
Calcium: 8.6 mg/dL — ABNORMAL LOW (ref 8.9–10.3)
GFR calc Af Amer: 44 mL/min — ABNORMAL LOW (ref >60)
GFR calc non Af Amer: 38 mL/min — ABNORMAL LOW (ref >60)
GLUCOSE: 308 mg/dL — AB (ref 65–99)
Potassium: 3.5 mmol/L (ref 3.5–5.1)
Sodium: 137 mmol/L (ref 135–145)
Total Protein: 5.4 g/dL — ABNORMAL LOW (ref 6.5–8.1)

## 2015-12-19 LAB — LACTIC ACID, PLASMA
Lactic Acid, Venous: 2.1 mmol/L (ref 0.5–1.9)
Lactic Acid, Venous: 2.4 mmol/L (ref 0.5–1.9)

## 2015-12-19 LAB — CBC WITH DIFFERENTIAL/PLATELET
BASOS PCT: 0 %
Basophils Absolute: 0 10*3/uL (ref 0.0–0.1)
Eosinophils Absolute: 0 10*3/uL (ref 0.0–0.7)
Eosinophils Relative: 0 %
HEMATOCRIT: 34 % — AB (ref 36.0–46.0)
Hemoglobin: 11.4 g/dL — ABNORMAL LOW (ref 12.0–15.0)
LYMPHS ABS: 1.3 10*3/uL (ref 0.7–4.0)
Lymphocytes Relative: 11 %
MCH: 28.3 pg (ref 26.0–34.0)
MCHC: 33.5 g/dL (ref 30.0–36.0)
MCV: 84.4 fL (ref 78.0–100.0)
MONO ABS: 0.8 10*3/uL (ref 0.1–1.0)
MONOS PCT: 7 %
NEUTROS ABS: 9.5 10*3/uL — AB (ref 1.7–7.7)
Neutrophils Relative %: 82 %
Platelets: 188 10*3/uL (ref 150–400)
RBC: 4.03 MIL/uL (ref 3.87–5.11)
RDW: 13.7 % (ref 11.5–15.5)
WBC: 11.6 10*3/uL — ABNORMAL HIGH (ref 4.0–10.5)

## 2015-12-19 MED ORDER — PNEUMOCOCCAL VAC POLYVALENT 25 MCG/0.5ML IJ INJ
0.5000 mL | INJECTION | INTRAMUSCULAR | Status: AC
  Administered 2015-12-22: 0.5 mL via INTRAMUSCULAR
  Filled 2015-12-19: qty 0.5

## 2015-12-19 MED ORDER — GADOBENATE DIMEGLUMINE 529 MG/ML IV SOLN
15.0000 mL | Freq: Once | INTRAVENOUS | Status: AC | PRN
  Administered 2015-12-19: 15 mL via INTRAVENOUS

## 2015-12-19 MED ORDER — FENTANYL CITRATE (PF) 100 MCG/2ML IJ SOLN
25.0000 ug | INTRAMUSCULAR | Status: DC | PRN
  Administered 2015-12-19 – 2015-12-20 (×2): 25 ug via INTRAVENOUS
  Filled 2015-12-19 (×2): qty 2

## 2015-12-19 MED ORDER — METOPROLOL TARTRATE 5 MG/5ML IV SOLN
5.0000 mg | INTRAVENOUS | Status: DC | PRN
  Administered 2015-12-21 – 2015-12-22 (×2): 5 mg via INTRAVENOUS
  Filled 2015-12-19 (×2): qty 5

## 2015-12-19 MED ORDER — SODIUM CHLORIDE 0.9 % IV SOLN
INTRAVENOUS | Status: DC
  Administered 2015-12-19 – 2015-12-22 (×5): via INTRAVENOUS

## 2015-12-19 MED ORDER — INFLUENZA VAC SPLIT QUAD 0.5 ML IM SUSY
0.5000 mL | PREFILLED_SYRINGE | INTRAMUSCULAR | Status: AC
  Administered 2015-12-22: 0.5 mL via INTRAMUSCULAR
  Filled 2015-12-19: qty 0.5

## 2015-12-19 MED ORDER — SODIUM CHLORIDE 0.9 % IV BOLUS (SEPSIS)
500.0000 mL | Freq: Once | INTRAVENOUS | Status: AC
  Administered 2015-12-19: 500 mL via INTRAVENOUS

## 2015-12-19 NOTE — Consult Note (Signed)
   [facility] CM Inpatient Consult   12/19/2015  [name] [birth year] [id number]   Patient screened for potential [place] [facility] services. Patient is eligible for [facility] Care Management services under patient's [employer] Medicare plan.  Chart review reveals the patient, [name], is a 82 y.o. female with medical history significant of CP, chronic interstitial cystitis, Chronic back pain (postlaminectomy syndrome),stage 3 CKD, type 2 diabetes, GERD, hyperlipidemia, hypertension, macular degeneration, microhematuria, osteopenia, pernicious anemia, right carpal tunnel syndrome who came to the emergency department due to abdominal pain associated with nausea, emesis and fever.  Patient and visitors at bedside, [place] verified.  Patient accepted a brochure and contact information regarding benefits for services. No current needs identified.  Patient endorses Dr. Lavone Orn as her primary care provider and this is her first admission in the past 6 months. Has home 02.     Please place a [facility] Care Management consult or for questions contact:   Natividad Brood, RN BSN [facility] Liaison  [phone] business mobile phone Toll free office [phone]

## 2015-12-19 NOTE — ED Notes (Signed)
Return call received from Dr. Olevia Bowens.  Notified of elevated lactic acid.

## 2015-12-19 NOTE — Progress Notes (Signed)
   12/19/15 0550  Vitals  Temp 99.5 F (37.5 C)  Temp Source Oral  BP (!) 144/48  BP Location Right Arm  BP Method Automatic  Patient Position (if appropriate) Lying  Pulse Rate 79  Pulse Rate Source Dinamap  Resp 18  Oxygen Therapy  SpO2 100 %  O2 Device Nasal Cannula  O2 Flow Rate (L/min) 2 L/min  Height and Weight  Height 5\' 4"  (1.626 m)  Weight 71.4 kg (157 lb 6.5 oz)  Type of Scale Used Bed (pt unable to stand)  Type of Weight Actual  BSA (Calculated - sq m) 1.8 sq meters  BMI (Calculated) 27.1  Weight in (lb) to have BMI = 25 145.3  Admitted pt to rm 3E30 from ED, pt alert and oriented, denied pain at this time, oriented to room, call bell placed within reach.

## 2015-12-19 NOTE — ED Notes (Signed)
Arrived to room from MRI.  Alert and oriented at this time requesting something to eat.  Bag lunch provided per order.

## 2015-12-19 NOTE — Progress Notes (Signed)
Inpatient Diabetes Program Recommendations  AACE/ADA: New Consensus Statement on Inpatient Glycemic Control (2015)  Target Ranges:  Prepandial:   less than 140 mg/dL      Peak postprandial:   less than 180 mg/dL (1-2 hours)      Critically ill patients:  140 - 180 mg/dL   Results for LAKEDRA, DISCHER (MRN [id number]) as of 12/19/2015 14:31  Ref. Range 12/19/2015 06:11 12/19/2015 12:26  Glucose-Capillary Latest Ref Range: 65 - 99 mg/dL 310 (H) 242 (H)   Results for AUDRY, SPREEN (MRN [id number]) as of 12/19/2015 12:12  Ref. Range 01/16/2015 15:14  Hemoglobin A1C Latest Ref Range: 4.8 - 5.6 % 7.9 (H)   Review of Glycemic Control  Diabetes history: DM2 Outpatient Diabetes medications: Amaryl 2 mg daily  Current orders for Inpatient glycemic control: Novolog 0-9 units [facility]  Inpatient Diabetes Program Recommendations:   Per ADA recommendations "consider performing an A1C on all patients with diabetes or hyperglycemia admitted to the hospital if not performed in the prior 3 months".  Please consider Novolog 0-5 units QHS and Lantus 10 units (0.15 units per Kg) while in hospital and home oral medications are being held. Thank you,  Windy Carina, RN, BSN Diabetes Coordinator Inpatient Diabetes Program [phone] (Team Pager) [phone] ([place] office) [phone] [facility] office) [phone] [facility] office)

## 2015-12-19 NOTE — ED Notes (Signed)
Attempted to call report at this time.  Nurse to call back when available. 

## 2015-12-19 NOTE — Progress Notes (Signed)
PHARMACY - PHYSICIAN COMMUNICATION CRITICAL VALUE ALERT - BLOOD CULTURE IDENTIFICATION (BCID)  Results for orders placed or performed during the hospital encounter of 12/18/15  Blood Culture ID Panel (Reflexed) (Collected: 12/18/2015  7:07 PM)  Result Value Ref Range   Enterococcus species NOT DETECTED NOT DETECTED   Listeria monocytogenes NOT DETECTED NOT DETECTED   Staphylococcus species NOT DETECTED NOT DETECTED   Staphylococcus aureus NOT DETECTED NOT DETECTED   Streptococcus species NOT DETECTED NOT DETECTED   Streptococcus agalactiae NOT DETECTED NOT DETECTED   Streptococcus pneumoniae NOT DETECTED NOT DETECTED   Streptococcus pyogenes NOT DETECTED NOT DETECTED   Acinetobacter baumannii NOT DETECTED NOT DETECTED   Enterobacteriaceae species DETECTED (A) NOT DETECTED   Enterobacter cloacae complex NOT DETECTED NOT DETECTED   Escherichia coli NOT DETECTED NOT DETECTED   Klebsiella oxytoca NOT DETECTED NOT DETECTED   Klebsiella pneumoniae DETECTED (A) NOT DETECTED   Proteus species NOT DETECTED NOT DETECTED   Serratia marcescens NOT DETECTED NOT DETECTED   Carbapenem resistance NOT DETECTED NOT DETECTED   Haemophilus influenzae NOT DETECTED NOT DETECTED   Neisseria meningitidis NOT DETECTED NOT DETECTED   Pseudomonas aeruginosa NOT DETECTED NOT DETECTED   Candida albicans NOT DETECTED NOT DETECTED   Candida glabrata NOT DETECTED NOT DETECTED   Candida krusei NOT DETECTED NOT DETECTED   Candida parapsilosis NOT DETECTED NOT DETECTED   Candida tropicalis NOT DETECTED NOT DETECTED    Name of physician (or Provider) Contacted: Dr Cathlean Sauer  Changes to prescribed antibiotics required: DC Vanc  Levester Fresh, PharmD, BCPS, [facility] Clinical Pharmacist Pager [phone] 12/19/2015 10:19 AM

## 2015-12-19 NOTE — ED Notes (Signed)
Admitting physician paged for critical lactic acid of 2.4.  Waiting for return call.

## 2015-12-19 NOTE — Progress Notes (Signed)
PROGRESS NOTE    [name]  [id number] DOB: 02-Aug-1929 DOA: 12/18/2015 PCP: Irven Shelling, MD   Brief Narrative: 82 yo female with hx of pancreatitis in the past, presents with abdominal pain, nausea and vomiting. Sepsis syndrome and biliary duct dilatation. Admitted for MRCP and further workup.     Assessment & Plan:   Principal Problem:   Sepsis ([place]) Active Problems:   Uncontrolled hypertension   Type 2 diabetes mellitus with hyperglycemia (HCC)   Hyperlipidemia   Chronic diastolic heart failure (HCC)   CKD (chronic kidney disease), stage III   Lingular mass   Abnormal LFTs   Depression   1. Sepsis syndrome. No signs of infection, MRCP with no signs of cholangitis, abdominal examination with negative murphy sign, will follow on abdominal US. Will continue antibiotic for now with IV zosyn, will follow on cell count, temperature curve and cultures.  Will continue to hold on antihypertensive medications for now.   2. Nausea and vomiting. Will continue supportive care with IV fluids, antiacid therapy and antiemetics.  3. Abnormal LFTs. Continue supportive care and follow liver function in am.  4. T2DM. Will continue glucose cover and monitoring, serum glucose [phone]. Sliding scale. At home on glimeperide, will calculate insulin requirements before starting basal insulin therapy.   5. Heart failure diastolic. Patient euvlemic, will be caution not to produce volume overload, will continue gentle hydration for now.   6. ckd stage 3. Renal function with cr at 1.25 with k at 3,5, will continue to follow renal function in am, will change fluids to isotonic saline from NS with kcl. Elevated lactic acid, suspected type B acidosis.   7. depression. Reported lithium and mirtazapine, need to be reviewed.    DVT prophylaxis:  scd Code Status: full  Family Communication: No family at the bedside  Disposition Plan: home  Consultants:     Procedures:    Antimicrobials:    Subjective: Patient not in pain or dyspnea, no nausea or vomiting. Noted to be confused, unable to answer simple questions.   Objective: Vitals:   12/19/15 0400 12/19/15 0415 12/19/15 0430 12/19/15 0550  BP: (!) 135/122 138/69 153/90 (!) 144/48  Pulse: 83 75 78 79  Resp: 20 17 23 18   Temp:    99.5 F (37.5 C)  TempSrc:    Oral  SpO2: 100% 100% 100% 100%  Weight:    71.4 kg (157 lb 6.5 oz)  Height:    5\' 4"  (1.626 m)    Intake/Output Summary (Last 24 hours) at 12/19/15 1447 Last data filed at 12/19/15 1159  Gross per 24 hour  Intake             2470 ml  Output              276 ml  Net             2194 ml   Filed Weights   12/18/15 1420 12/19/15 0550  Weight: 75.8 kg (167 lb) 71.4 kg (157 lb 6.5 oz)    Examination:  General exam: deconditioned. E ENT: mild pallor, oral mucosa dry. Respiratory system: bibasilar rales, no wheezing. No rhonchi.  Cardiovascular system: S1 & S2 heard, RRR. No JVD, murmurs, rubs, gallops or clicks. No pedal edema. Gastrointestinal system: Abdomen is mild distended and timpanic soft and nontender. No organomegaly or masses felt. Normal bowel sounds heard. Central nervous system: Alert and oriented. No focal neurological deficits. Extremities: Symmetric 5 x 5 power. Skin: No rashes, lesions  or ulcers     Data Reviewed: I have personally reviewed following labs and imaging studies  CBC:  Recent Labs Lab 12/18/15 1435 12/19/15 0326  WBC 11.6* 11.6*  NEUTROABS  --  9.5*  HGB 13.7 11.4*  HCT 41.6 34.0*  MCV 84.9 84.4  PLT 244 0000000   Basic Metabolic Panel:  Recent Labs Lab 12/18/15 1435 12/19/15 0326  NA 138 137  K 3.7 3.5  CL 103 101  CO2 22 25  GLUCOSE 259* 308*  BUN 9 10  CREATININE 1.14* 1.25*  CALCIUM 9.4 8.6*   GFR: Estimated Creatinine Clearance: 32.5 mL/min (by C-G formula based on SCr of 1.25 mg/dL (H)). Liver Function Tests:  Recent Labs Lab 12/18/15 1758 12/19/15 0326  AST 530* 980*   ALT 184* 473*  ALKPHOS 173* 166*  BILITOT 2.9* 3.8*  PROT 6.8 5.4*  ALBUMIN 3.7 2.9*    Recent Labs Lab 12/18/15 1758  LIPASE 26   No results for input(s): AMMONIA in the last 168 hours. Coagulation Profile:  Recent Labs Lab 12/18/15 1758  INR 1.14   Cardiac Enzymes: No results for input(s): CKTOTAL, CKMB, CKMBINDEX, TROPONINI in the last 168 hours. BNP (last 3 results) No results for input(s): PROBNP in the last 8760 hours. HbA1C: No results for input(s): HGBA1C in the last 72 hours. CBG:  Recent Labs Lab 12/19/15 0611 12/19/15 1226  GLUCAP 310* 242*   Lipid Profile: No results for input(s): CHOL, HDL, LDLCALC, TRIG, CHOLHDL, LDLDIRECT in the last 72 hours. Thyroid Function Tests: No results for input(s): TSH, T4TOTAL, FREET4, T3FREE, THYROIDAB in the last 72 hours. Anemia Panel: No results for input(s): VITAMINB12, FOLATE, FERRITIN, TIBC, IRON, RETICCTPCT in the last 72 hours. Sepsis Labs:  Recent Labs Lab 12/18/15 2039 12/18/15 2328 12/18/15 2333 12/19/15 0354  LATICACIDVEN 3.65* 2.1* 2.00* 2.4*    Recent Results (from the past 240 hour(s))  Blood Culture (routine x 2)     Status: None (Preliminary result)   Collection Time: 12/18/15  7:07 PM  Result Value Ref Range Status   Specimen Description BLOOD LEFT HAND  Final   Special Requests BOTTLES DRAWN AEROBIC AND ANAEROBIC 5CC  Final   Culture  Setup Time   Final    GRAM NEGATIVE RODS ANAEROBIC BOTTLE ONLY CRITICAL RESULT CALLED TO, READ BACK BY AND VERIFIED WITH: M MACCIA,PHARMD AT 1011 12/19/15 BY L BENFIELD    Culture GRAM NEGATIVE RODS  Final   Report Status PENDING  Incomplete  Blood Culture ID Panel (Reflexed)     Status: Abnormal   Collection Time: 12/18/15  7:07 PM  Result Value Ref Range Status   Enterococcus species NOT DETECTED NOT DETECTED Final   Listeria monocytogenes NOT DETECTED NOT DETECTED Final   Staphylococcus species NOT DETECTED NOT DETECTED Final   Staphylococcus aureus  NOT DETECTED NOT DETECTED Final   Streptococcus species NOT DETECTED NOT DETECTED Final   Streptococcus agalactiae NOT DETECTED NOT DETECTED Final   Streptococcus pneumoniae NOT DETECTED NOT DETECTED Final   Streptococcus pyogenes NOT DETECTED NOT DETECTED Final   Acinetobacter baumannii NOT DETECTED NOT DETECTED Final   Enterobacteriaceae species DETECTED (A) NOT DETECTED Final    Comment: CRITICAL RESULT CALLED TO, READ BACK BY AND VERIFIED WITH: M MACCIA,PHARMD AT 1011 12/19/15 BY L BENFIELD    Enterobacter cloacae complex NOT DETECTED NOT DETECTED Final   Escherichia coli NOT DETECTED NOT DETECTED Final   Klebsiella oxytoca NOT DETECTED NOT DETECTED Final   Klebsiella pneumoniae DETECTED (A) NOT  DETECTED Final    Comment: CRITICAL RESULT CALLED TO, READ BACK BY AND VERIFIED WITH: M MACCIA,PHARMD AT 1011 12/19/15 BY L BENFIELD    Proteus species NOT DETECTED NOT DETECTED Final   Serratia marcescens NOT DETECTED NOT DETECTED Final   Carbapenem resistance NOT DETECTED NOT DETECTED Final   Haemophilus influenzae NOT DETECTED NOT DETECTED Final   Neisseria meningitidis NOT DETECTED NOT DETECTED Final   Pseudomonas aeruginosa NOT DETECTED NOT DETECTED Final   Candida albicans NOT DETECTED NOT DETECTED Final   Candida glabrata NOT DETECTED NOT DETECTED Final   Candida krusei NOT DETECTED NOT DETECTED Final   Candida parapsilosis NOT DETECTED NOT DETECTED Final   Candida tropicalis NOT DETECTED NOT DETECTED Final         Radiology Studies: Mr 3d Recon At Scanner  Result Date: 12/19/2015 CLINICAL DATA:  82 year old female presenting with abnormal LFTs. Abdominal pain and nausea and vomiting. History of prior cholangitis. EXAM: MRI ABDOMEN WITHOUT AND WITH CONTRAST (INCLUDING MRCP) TECHNIQUE: Multiplanar multisequence MR imaging of the abdomen was performed both before and after the administration of intravenous contrast. Heavily T2-weighted images of the biliary and pancreatic ducts  were obtained, and three-dimensional MRCP images were rendered by post processing. CONTRAST:  54mL MULTIHANCE GADOBENATE DIMEGLUMINE 529 MG/ML IV SOLN COMPARISON:  Abdominal MRI dated 02/05/2015 FINDINGS: Evaluation of this exam is limited due to respiratory motion artifact. Lower chest: No acute findings. Hepatobiliary: The liver appears unremarkable. No abnormal enhancement or focal lesion identified. Mild intrahepatic biliary ductal dilatation. There is dilatation of the extrahepatic biliary tree and common bile duct. The common bile duct measures up to 14 mm proximally and 13 mm in diameter in the head of the pancreas. This findings are similar to the prior MRI. The gallbladder mildly distended. Trace fluid is noted adjacent to the gallbladder as seen on the prior studies. No stone identified within the gallbladder or in the CBD. Ultrasound may provide better evaluation of the gallbladder. No focal stenosis of the biliary tree. There is no significant irregularity or beaded appearance of the bile ducts. No definite abnormal enhancement to suggest cholangitis. No occlusive delivery lesions identified. Pancreas: There is a 2.5 cm simple appearing in the uncinate process of the pancreas as seen on the prior MRI. The pancreas otherwise appears unremarkable. The bony pancreatic duct measures approximately 4 mm in diameter. Spleen:  Within normal limits in size and appearance. Adrenals/Urinary Tract: The adrenal glands appear unremarkable. Small left renal measuring up to approximately 15 mm. There is mild atrophy of the renal parenchyma. No hydronephrosis. Stomach/Bowel: Visualized portions within the abdomen are unremarkable. Vascular/Lymphatic: No pathologically enlarged lymph nodes identified. No abdominal aortic aneurysm demonstrated. Other:  None Musculoskeletal: There is degenerative changes of the spine. IMPRESSION: Stable appearing dilatation of the biliary tree similar to prior study and without evidence of  obstructing stone or lesion. No definite evidence of acute cholangitis by MRI. Evaluation however is limited due to respiratory motion artifact. Correlation with clinical exam recommended. Small pericholecystic fluid as seen on the prior studies. Ultrasound may provide better evaluation of the gallbladder. Stable appearing simple cystic lesion at the head of the pancreas. Electronically Signed   By: Anner Crete M.D.   On: 12/19/2015 04:17   Dg Chest Portable 1 View  Result Date: 12/18/2015 CLINICAL DATA:  Chest pain.  Concern for dissection. EXAM: PORTABLE CHEST 1 VIEW COMPARISON:  02/06/2015 FINDINGS: Heart size mildly enlarged. Negative for heart failure. Mild streaky markings in the bases most  consistent with scarring or atelectasis. Negative for pneumonia or effusion. Atherosclerotic calcification in the aorta. Mediastinum not widened. IMPRESSION: Mild bibasilar atelectasis/ scarring. Electronically Signed   By: Franchot Gallo M.D.   On: 12/18/2015 18:26   Mr Abdomen Mrcp Moise Boring Contast  Result Date: 12/19/2015 CLINICAL DATA:  82 year old female presenting with abnormal LFTs. Abdominal pain and nausea and vomiting. History of prior cholangitis. EXAM: MRI ABDOMEN WITHOUT AND WITH CONTRAST (INCLUDING MRCP) TECHNIQUE: Multiplanar multisequence MR imaging of the abdomen was performed both before and after the administration of intravenous contrast. Heavily T2-weighted images of the biliary and pancreatic ducts were obtained, and three-dimensional MRCP images were rendered by post processing. CONTRAST:  66mL MULTIHANCE GADOBENATE DIMEGLUMINE 529 MG/ML IV SOLN COMPARISON:  Abdominal MRI dated 02/05/2015 FINDINGS: Evaluation of this exam is limited due to respiratory motion artifact. Lower chest: No acute findings. Hepatobiliary: The liver appears unremarkable. No abnormal enhancement or focal lesion identified. Mild intrahepatic biliary ductal dilatation. There is dilatation of the extrahepatic biliary  tree and common bile duct. The common bile duct measures up to 14 mm proximally and 13 mm in diameter in the head of the pancreas. This findings are similar to the prior MRI. The gallbladder mildly distended. Trace fluid is noted adjacent to the gallbladder as seen on the prior studies. No stone identified within the gallbladder or in the CBD. Ultrasound may provide better evaluation of the gallbladder. No focal stenosis of the biliary tree. There is no significant irregularity or beaded appearance of the bile ducts. No definite abnormal enhancement to suggest cholangitis. No occlusive delivery lesions identified. Pancreas: There is a 2.5 cm simple appearing in the uncinate process of the pancreas as seen on the prior MRI. The pancreas otherwise appears unremarkable. The bony pancreatic duct measures approximately 4 mm in diameter. Spleen:  Within normal limits in size and appearance. Adrenals/Urinary Tract: The adrenal glands appear unremarkable. Small left renal measuring up to approximately 15 mm. There is mild atrophy of the renal parenchyma. No hydronephrosis. Stomach/Bowel: Visualized portions within the abdomen are unremarkable. Vascular/Lymphatic: No pathologically enlarged lymph nodes identified. No abdominal aortic aneurysm demonstrated. Other:  None Musculoskeletal: There is degenerative changes of the spine. IMPRESSION: Stable appearing dilatation of the biliary tree similar to prior study and without evidence of obstructing stone or lesion. No definite evidence of acute cholangitis by MRI. Evaluation however is limited due to respiratory motion artifact. Correlation with clinical exam recommended. Small pericholecystic fluid as seen on the prior studies. Ultrasound may provide better evaluation of the gallbladder. Stable appearing simple cystic lesion at the head of the pancreas. Electronically Signed   By: Anner Crete M.D.   On: 12/19/2015 04:17   Ct Angio Chest/abd/pel For Dissection W And/or  Wo Contrast  Result Date: 12/18/2015 CLINICAL DATA:  Chest pain.  Incontinence.  Concern for dissection. EXAM: CT ANGIOGRAPHY CHEST, ABDOMEN AND PELVIS TECHNIQUE: Multidetector CT imaging through the chest, abdomen and pelvis was performed using the standard protocol during bolus administration of intravenous contrast. Multiplanar reconstructed images and MIPs were obtained and reviewed to evaluate the vascular anatomy. CONTRAST:  80 cc Isovue 370 intravenous COMPARISON:  MRCP 06/19/2015. FINDINGS: CTA CHEST FINDINGS Cardiovascular: Noncontrast phase shows no intramural hematoma in the aorta. No aortic dissection, aneurysm, or stenosis. Atherosclerosis, including the aorta and coronary arteries. No cardiomegaly. No pericardial effusion. Limited evaluation the pulmonary arteries due to motion. No central filling defect identified. Mediastinum/Nodes: No adenopathy.  Negative esophagus Lungs/Pleura: Motion degraded evaluation. Mild atelectasis. There  is no edema, consolidation, effusion, or pneumothorax. Musculoskeletal: See below Review of the MIP images confirms the above findings. CTA ABDOMEN AND PELVIS FINDINGS VASCULAR Aorta: Diffuse atherosclerosis.  No aneurysm or stenosis. Celiac: High-grade narrowing, with narrowing accentuated by median arcuate ligament given the morphology. Mild hypertrophy of peripancreatic collaterals. SMA: Atherosclerosis without stenosis. Renals: Atherosclerosis without suspected flow limiting stenosis. Single bilateral renal arteries. IMA: Patent Inflow: Atheromatous without stenosis or occlusion.  No aneurysm. Veins: No obvious venous abnormality within the limitations of this arterial phase study. Review of the MIP images confirms the above findings. NON-VASCULAR Hepatobiliary: No focal liver abnormality.Chronic dilatation of the intrahepatic and extrahepatic biliary tree without visible mass or choledocholithiasis. The common bile duct measures up to 19 mm. The gallbladder is  distended and there is mild pericholecystic fluid, similar to 02/05/2015 MRCP. Pancreas: Chronic prominence of the main pancreatic duct measuring up to 4-5 mm. Unilocular pancreatic head cyst measuring 24 mm, which had layering secretions on previous MRI. Punctate calcifications seen dependently today. Spleen: Unremarkable. Adrenals/Urinary Tract: Negative adrenals. No hydronephrosis or stone. Simple appearing left renal cyst. The bladder appears thick walled with mild perivesicular stranding. Sensitivity is diminished by streak artifact from left hip prosthesis. Stomach/Bowel: Extensive distal colonic diverticulosis without active inflammation. No bowel obstruction. No appendicitis. Mid duodenal diverticulum wrapping behind the pancreatic head. Lymphatic:   No mass or adenopathy. Reproductive:No pathologic findings. Other: No ascites or pneumoperitoneum. Musculoskeletal: Severe lumbar disc degeneration with extensive sclerosis and endplate irregularity. Remote L1 superior endplate fracture. No acute fracture noted. Total left hip arthroplasty without adverse finding. Review of the MIP images confirms the above findings. IMPRESSION: 1. Negative for aortic dissection or other acute arterial finding. 2. Suspect cystitis.  Correlate with urinalysis. 3. Chronic biliary tree dilatation. Reference MRCP 06/19/2015. There is mild pericholecystic fluid, raising the possibility of cholecystitis, but this appearance was also present on December 2016 MRCP. 4. Unilocular, benign-appearing pancreatic head mass that is unchanged from MRCP. 5. 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 Electronically Signed   By: Monte Fantasia M.D.   On: 12/18/2015 20:42        Scheduled Meds: . famotidine (PEPCID) IV  20 mg Intravenous Q12H  . fentaNYL (SUBLIMAZE) injection  50 mcg Intravenous Once  . [START ON 12/20/2015] Influenza vac split  quadrivalent PF  0.5 mL Intramuscular Tomorrow-1000  . insulin aspart  0-9 Units Subcutaneous TID WC  . piperacillin-tazobactam (ZOSYN)  IV  3.375 g Intravenous Q8H  . [START ON 12/20/2015] pneumococcal 23 valent vaccine  0.5 mL Intramuscular Tomorrow-1000  . sodium chloride flush  3 mL Intravenous Q12H   Continuous Infusions: . 0.9 % NaCl with KCl 20 mEq / L 75 mL/hr at 12/19/15 [id number]     LOS: 1 day      Tawni Millers, MD [facility] Pager [phone]  If 7PM-7AM, please contact night-coverage [email or web address] Password [email or web address] 12/19/2015, 2:47 PM

## 2015-12-19 NOTE — ED Notes (Signed)
Pt taken to MRI  

## 2015-12-20 DIAGNOSIS — K8010 Calculus of gallbladder with chronic cholecystitis without obstruction: Secondary | ICD-10-CM | POA: Diagnosis not present

## 2015-12-20 DIAGNOSIS — R740 Nonspecific elevation of levels of transaminase and lactic acid dehydrogenase [LDH]: Secondary | ICD-10-CM | POA: Diagnosis not present

## 2015-12-20 DIAGNOSIS — R7881 Bacteremia: Secondary | ICD-10-CM

## 2015-12-20 DIAGNOSIS — K811 Chronic cholecystitis: Secondary | ICD-10-CM | POA: Diagnosis not present

## 2015-12-20 DIAGNOSIS — R112 Nausea with vomiting, unspecified: Secondary | ICD-10-CM | POA: Diagnosis not present

## 2015-12-20 DIAGNOSIS — N183 Chronic kidney disease, stage 3 (moderate): Secondary | ICD-10-CM | POA: Diagnosis not present

## 2015-12-20 DIAGNOSIS — E1165 Type 2 diabetes mellitus with hyperglycemia: Secondary | ICD-10-CM | POA: Diagnosis not present

## 2015-12-20 DIAGNOSIS — R1013 Epigastric pain: Secondary | ICD-10-CM | POA: Diagnosis not present

## 2015-12-20 DIAGNOSIS — K810 Acute cholecystitis: Secondary | ICD-10-CM

## 2015-12-20 DIAGNOSIS — I5032 Chronic diastolic (congestive) heart failure: Secondary | ICD-10-CM | POA: Diagnosis not present

## 2015-12-20 DIAGNOSIS — R7989 Other specified abnormal findings of blood chemistry: Secondary | ICD-10-CM | POA: Diagnosis not present

## 2015-12-20 DIAGNOSIS — A419 Sepsis, unspecified organism: Secondary | ICD-10-CM | POA: Diagnosis not present

## 2015-12-20 LAB — COMPREHENSIVE METABOLIC PANEL
ALT: 344 U/L — AB (ref 14–54)
AST: 371 U/L — AB (ref 15–41)
Albumin: 2.6 g/dL — ABNORMAL LOW (ref 3.5–5.0)
Alkaline Phosphatase: 155 U/L — ABNORMAL HIGH (ref 38–126)
Anion gap: 9 (ref 5–15)
BILIRUBIN TOTAL: 3.6 mg/dL — AB (ref 0.3–1.2)
BUN: 6 mg/dL (ref 6–20)
CHLORIDE: 106 mmol/L (ref 101–111)
CO2: 24 mmol/L (ref 22–32)
CREATININE: 1.13 mg/dL — AB (ref 0.44–1.00)
Calcium: 8.4 mg/dL — ABNORMAL LOW (ref 8.9–10.3)
GFR, EST AFRICAN AMERICAN: 50 mL/min — AB (ref >60)
GFR, EST NON AFRICAN AMERICAN: 43 mL/min — AB (ref >60)
Glucose, Bld: 171 mg/dL — ABNORMAL HIGH (ref 65–99)
POTASSIUM: 3.5 mmol/L (ref 3.5–5.1)
Sodium: 139 mmol/L (ref 135–145)
TOTAL PROTEIN: 5.4 g/dL — AB (ref 6.5–8.1)

## 2015-12-20 LAB — GLUCOSE, CAPILLARY
GLUCOSE-CAPILLARY: 156 mg/dL — AB (ref 65–99)
GLUCOSE-CAPILLARY: 93 mg/dL (ref 65–99)
Glucose-Capillary: 146 mg/dL — ABNORMAL HIGH (ref 65–99)
Glucose-Capillary: 136 mg/dL — ABNORMAL HIGH (ref 65–99)
Glucose-Capillary: 158 mg/dL — ABNORMAL HIGH (ref 65–99)

## 2015-12-20 LAB — CBC WITH DIFFERENTIAL/PLATELET
Basophils Absolute: 0 10*3/uL (ref 0.0–0.1)
Basophils Relative: 0 %
EOS PCT: 1 %
Eosinophils Absolute: 0.1 10*3/uL (ref 0.0–0.7)
HEMATOCRIT: 32.1 % — AB (ref 36.0–46.0)
Hemoglobin: 10.8 g/dL — ABNORMAL LOW (ref 12.0–15.0)
LYMPHS ABS: 0.9 10*3/uL (ref 0.7–4.0)
LYMPHS PCT: 16 %
MCH: 28.3 pg (ref 26.0–34.0)
MCHC: 33.6 g/dL (ref 30.0–36.0)
MCV: 84.3 fL (ref 78.0–100.0)
MONO ABS: 0.7 10*3/uL (ref 0.1–1.0)
MONOS PCT: 11 %
NEUTROS ABS: 4.2 10*3/uL (ref 1.7–7.7)
Neutrophils Relative %: 72 %
PLATELETS: 133 10*3/uL — AB (ref 150–400)
RBC: 3.81 MIL/uL — ABNORMAL LOW (ref 3.87–5.11)
RDW: 14 % (ref 11.5–15.5)
WBC: 5.8 10*3/uL (ref 4.0–10.5)

## 2015-12-20 LAB — URINE CULTURE: Culture: >=100000 — AB

## 2015-12-20 MED ORDER — PREGABALIN 75 MG PO CAPS
75.0000 mg | ORAL_CAPSULE | Freq: Two times a day (BID) | ORAL | Status: DC
  Administered 2015-12-20 – 2015-12-23 (×6): 75 mg via ORAL
  Filled 2015-12-20 (×6): qty 1

## 2015-12-20 MED ORDER — DEXTROSE 5 % IV SOLN
2.0000 g | INTRAVENOUS | Status: DC
  Filled 2015-12-20: qty 2

## 2015-12-20 MED ORDER — POTASSIUM CHLORIDE 10 MEQ/100ML IV SOLN
10.0000 meq | INTRAVENOUS | Status: AC
  Administered 2015-12-20 (×2): 10 meq via INTRAVENOUS
  Filled 2015-12-20 (×2): qty 100

## 2015-12-20 MED ORDER — MIRTAZAPINE 15 MG PO TABS
30.0000 mg | ORAL_TABLET | Freq: Every day | ORAL | Status: DC
  Administered 2015-12-20 – 2015-12-22 (×3): 30 mg via ORAL
  Filled 2015-12-20 (×4): qty 2

## 2015-12-20 MED ORDER — VILAZODONE HCL 20 MG PO TABS: 20.0000 mg | ORAL_TABLET | Freq: Every day | ORAL | Status: DC

## 2015-12-20 MED ORDER — OXYBUTYNIN CHLORIDE 5 MG PO TABS
5.0000 mg | ORAL_TABLET | Freq: Two times a day (BID) | ORAL | Status: DC
  Administered 2015-12-20 – 2015-12-23 (×6): 5 mg via ORAL
  Filled 2015-12-20 (×6): qty 1

## 2015-12-20 MED ORDER — SODIUM CHLORIDE 0.9 % IV BOLUS (SEPSIS)
250.0000 mL | Freq: Once | INTRAVENOUS | Status: AC
  Administered 2015-12-20: 250 mL via INTRAVENOUS

## 2015-12-20 MED ORDER — PIPERACILLIN-TAZOBACTAM 3.375 G IVPB
3.3750 g | Freq: Three times a day (TID) | INTRAVENOUS | Status: DC
  Administered 2015-12-20 – 2015-12-23 (×7): 3.375 g via INTRAVENOUS
  Filled 2015-12-20 (×10): qty 50

## 2015-12-20 MED ORDER — VILAZODONE HCL 10 MG PO TABS
20.0000 mg | ORAL_TABLET | Freq: Every day | ORAL | Status: DC
  Administered 2015-12-20 – 2015-12-23 (×3): 20 mg via ORAL
  Filled 2015-12-20 (×5): qty 2

## 2015-12-20 MED ORDER — PIPERACILLIN-TAZOBACTAM 3.375 G IVPB
3.3750 g | Freq: Three times a day (TID) | INTRAVENOUS | Status: DC
  Administered 2015-12-20: 3.375 g via INTRAVENOUS
  Filled 2015-12-20 (×3): qty 50

## 2015-12-20 MED ORDER — LITHIUM CARBONATE 150 MG PO CAPS
150.0000 mg | ORAL_CAPSULE | Freq: Every day | ORAL | Status: DC
  Administered 2015-12-20 – 2015-12-23 (×3): 150 mg via ORAL
  Filled 2015-12-20 (×4): qty 1

## 2015-12-20 NOTE — Progress Notes (Signed)
Patient temperature 100.2 , MD notified twice. New orders noted. Patient confused and pulled IV bleeding noted. Charge nurse notified.

## 2015-12-20 NOTE — Consult Note (Signed)
Referring Provider: Dr. Cathlean Sauer Primary Care Physician:  Irven Shelling, MD Primary Gastroenterologist:  Dr. Paulita Fujita  Reason for Consultation:  Abdominal pain; Elevated LFTs  HPI: [name] is a 82 y.o. female with history of gallstone pancreatitis who had an MRCP last year that showed biliary dilation and an EUS in 02/2015 that showed a mucinous pancreatic cyst without any common bile duct stones. Patient admitted for acute pancreatitis with Klebsiella bacteremia and sepsis. U/S showed acute acalculous cholecystitis. MRCP showed a dilated CBD (stable c/w 2016 MRCP) without any imaging evidence of a CBD stone (limited by respiratory artifact). No pancreatitis seen on MRCP. TB 3.6 (2.9), AST 371 (530 then 980), ALT 344 (184 then 473), ALP 155 (173). WBC 5.8 (11.6). Complaining of upper quadrant pain and had epigastric pain/N/V X 3 days prior to admit. N/V has improved but pain ongoing.   Past Medical History:  Diagnosis Date  . Chest pain    a. 07/2014 Neg MV, nl EF.  Marland Kitchen Chronic interstitial cystitis   . Chronic low back pain   . CKD (chronic kidney disease), stage III   . Diabetes mellitus without complication ([place])   . GERD (gastroesophageal reflux disease)   . Hyperlipidemia   . Hypertension   . Macular degeneration   . Microhematuria   . Osteopenia   . Pernicious anemia   . Postlaminectomy syndrome   . PVC (premature ventricular contraction)   . Right carpal tunnel syndrome     Past Surgical History:  Procedure Laterality Date  . BACK SURGERY    . EUS N/A 02/23/2015   Procedure: ESOPHAGEAL ENDOSCOPIC ULTRASOUND (EUS) RADIAL;  Surgeon: Arta Silence, MD;  Location: [facility];  Service: Endoscopy;  Laterality: N/A;  . JOINT REPLACEMENT Left    hip and knee    Prior to Admission medications   Medication Sig Start Date End Date Taking? Authorizing Provider  acetaminophen (TYLENOL) 500 MG tablet Take 500 mg by mouth every 6 (six) hours as needed for mild pain.   Yes  Historical Provider, MD  amLODipine (NORVASC) 5 MG tablet Take 1 tablet (5 mg total) by mouth daily. 11/21/14  Yes Ripudeep Krystal Eaton, MD  aspirin EC 81 MG tablet Take 81 mg by mouth daily.   Yes Historical Provider, MD  atorvastatin (LIPITOR) 10 MG tablet Take 5 mg by mouth daily.    Yes Historical Provider, MD  cholecalciferol (VITAMIN D) 1000 UNITS tablet Take 2,000 Units by mouth daily.    Yes Historical Provider, MD  conjugated estrogens (PREMARIN) vaginal cream Place 1 Applicatorful vaginally daily as needed (burning).    Yes Historical Provider, MD  cyanocobalamin (,VITAMIN B-12,) 1000 MCG/ML injection Inject 1,000 mcg into the muscle every 30 (thirty) days.   Yes Historical Provider, MD  furosemide (LASIX) 20 MG tablet Take 2 tablets (40 mg total) by mouth daily. Patient taking differently: Take 20 mg by mouth daily.  02/08/15  Yes Domenic Polite, MD  glimepiride (AMARYL) 1 MG tablet Take 2 tablets (2 mg total) by mouth every morning. 01/17/15  Yes Charlynne Cousins, MD  lisinopril (PRINIVIL,ZESTRIL) 20 MG tablet Take 1 tablet (20 mg total) by mouth every evening. 07/22/14  Yes Maryann Mikhail, DO  lithium carbonate 150 MG capsule Take 150 mg by mouth daily.   Yes Historical Provider, MD  metoprolol (LOPRESSOR) 50 MG tablet Take 1 tablet (50 mg total) by mouth 2 (two) times daily. 07/22/14  Yes Maryann Mikhail, DO  mirtazapine (REMERON) 30 MG tablet Take 30  mg by mouth at bedtime.    Yes Historical Provider, MD  nitroGLYCERIN (NITROSTAT) 0.4 MG SL tablet Place 1 tablet (0.4 mg total) under the tongue every 5 (five) minutes as needed for chest pain. 07/22/14  Yes Maryann Mikhail, DO  omeprazole (PRILOSEC) 40 MG capsule Take 40 mg by mouth every evening.    Yes Historical Provider, MD  oxybutynin (DITROPAN) 5 MG tablet Take 5 mg by mouth 2 (two) times daily.    Yes Historical Provider, MD  pregabalin (LYRICA) 75 MG capsule Take 75 mg by mouth 2 (two) times daily.   Yes Historical Provider, MD   traMADol (ULTRAM) 50 MG tablet Take 1 tablet (50 mg total) by mouth 2 (two) times daily as needed (pain). Patient taking differently: Take 50 mg by mouth 2 (two) times daily.  07/22/14  Yes Maryann Mikhail, DO  Vilazodone HCl 20 MG TABS Take 20 mg by mouth daily. Viibryd   Yes Historical Provider, MD  zolpidem (AMBIEN) 5 MG tablet Take 2.5 mg by mouth at bedtime.    Yes Historical Provider, MD  aspirin EC 325 MG EC tablet Take 1 tablet (325 mg total) by mouth daily. Patient not taking: Reported on 12/18/2015 11/21/14   Ripudeep Krystal Eaton, MD  levofloxacin (LEVAQUIN) 750 MG tablet Take 1 tablet (750 mg total) by mouth every other day. For 9 days Patient not taking: Reported on 12/18/2015 02/08/15   Domenic Polite, MD    Scheduled Meds: . famotidine (PEPCID) IV  20 mg Intravenous Q12H  . fentaNYL (SUBLIMAZE) injection  50 mcg Intravenous Once  . Influenza vac split quadrivalent PF  0.5 mL Intramuscular Tomorrow-1000  . insulin aspart  0-9 Units Subcutaneous TID WC  . lithium carbonate  150 mg Oral Daily  . mirtazapine  30 mg Oral QHS  . oxybutynin  5 mg Oral BID  . piperacillin-tazobactam (ZOSYN)  IV  3.375 g Intravenous Q8H  . pneumococcal 23 valent vaccine  0.5 mL Intramuscular Tomorrow-1000  . pregabalin  75 mg Oral BID  . sodium chloride flush  3 mL Intravenous Q12H  . Vilazodone HCl  20 mg Oral Daily   Continuous Infusions: . sodium chloride 75 mL/hr at 12/19/15 2306   PRN Meds:.fentaNYL (SUBLIMAZE) injection, metoprolol, ondansetron **OR** ondansetron (ZOFRAN) IV  Allergies as of 12/18/2015 - Review Complete 12/18/2015  Allergen Reaction Noted  . Codeine Other (See Comments) 08/31/2013    Family History  Problem Relation Age of Onset  . Arthritis Mother   . Heart attack Father     Social History   Social History  . Marital status: Married    Spouse name: N/A  . Number of children: N/A  . Years of education: N/A   Occupational History  . Not on file.   Social History  Main Topics  . Smoking status: Never Smoker  . Smokeless tobacco: Never Used  . Alcohol use No  . Drug use: No  . Sexual activity: Not on file   Other Topics Concern  . Not on file   Social History Narrative  . No narrative on file    Review of Systems: All negative except as stated above in HPI.  Physical Exam: Vital signs: Vitals:   12/20/15 0553 12/20/15 1148  BP: (!) 154/71 (!) 168/87  Pulse: 84 100  Resp: 20 20  Temp: 98.6 F (37 C) 99.5 F (37.5 C)   Last BM Date: 12/19/15 General:   Lethargic, elderly, frail, Well-developed, well-nourished, moderate acute distress HEENT: anicteric  sclera, oropharynx clear Neck: supple, nontender Lungs:  Coarse breath sounds Heart:  Regular rate and rhythm; no murmurs, clicks, rubs,  or gallops. Abdomen: upper quadrant tenderness with guarding (greatest in epigastric), soft, mild distention, +BS  Rectal:  Deferred Ext: no edema  GI:  Lab Results:  Recent Labs  12/18/15 1435 12/19/15 0326 12/20/15 0247  WBC 11.6* 11.6* 5.8  HGB 13.7 11.4* 10.8*  HCT 41.6 34.0* 32.1*  PLT 244 188 133*   BMET  Recent Labs  12/18/15 1435 12/19/15 0326 12/20/15 0247  NA 138 137 139  K 3.7 3.5 3.5  CL 103 101 106  CO2 22 25 24   GLUCOSE 259* 308* 171*  BUN 9 10 6   CREATININE 1.14* 1.25* 1.13*  CALCIUM 9.4 8.6* 8.4*   LFT  Recent Labs  12/18/15 1758  12/20/15 0247  PROT 6.8  < > 5.4*  ALBUMIN 3.7  < > 2.6*  AST 530*  < > 371*  ALT 184*  < > 344*  ALKPHOS 173*  < > 155*  BILITOT 2.9*  < > 3.6*  BILIDIR 1.8*  --   --   IBILI 1.1*  --   --   < > = values in this interval not displayed. PT/INR  Recent Labs  12/18/15 1758  LABPROT 14.7  INR 1.14     Studies/Results: Mr 3d Recon At Scanner  Result Date: 12/19/2015 CLINICAL DATA:  82 year old female presenting with abnormal LFTs. Abdominal pain and nausea and vomiting. History of prior cholangitis. EXAM: MRI ABDOMEN WITHOUT AND WITH CONTRAST (INCLUDING MRCP)  TECHNIQUE: Multiplanar multisequence MR imaging of the abdomen was performed both before and after the administration of intravenous contrast. Heavily T2-weighted images of the biliary and pancreatic ducts were obtained, and three-dimensional MRCP images were rendered by post processing. CONTRAST:  65mL MULTIHANCE GADOBENATE DIMEGLUMINE 529 MG/ML IV SOLN COMPARISON:  Abdominal MRI dated 02/05/2015 FINDINGS: Evaluation of this exam is limited due to respiratory motion artifact. Lower chest: No acute findings. Hepatobiliary: The liver appears unremarkable. No abnormal enhancement or focal lesion identified. Mild intrahepatic biliary ductal dilatation. There is dilatation of the extrahepatic biliary tree and common bile duct. The common bile duct measures up to 14 mm proximally and 13 mm in diameter in the head of the pancreas. This findings are similar to the prior MRI. The gallbladder mildly distended. Trace fluid is noted adjacent to the gallbladder as seen on the prior studies. No stone identified within the gallbladder or in the CBD. Ultrasound may provide better evaluation of the gallbladder. No focal stenosis of the biliary tree. There is no significant irregularity or beaded appearance of the bile ducts. No definite abnormal enhancement to suggest cholangitis. No occlusive delivery lesions identified. Pancreas: There is a 2.5 cm simple appearing in the uncinate process of the pancreas as seen on the prior MRI. The pancreas otherwise appears unremarkable. The bony pancreatic duct measures approximately 4 mm in diameter. Spleen:  Within normal limits in size and appearance. Adrenals/Urinary Tract: The adrenal glands appear unremarkable. Small left renal measuring up to approximately 15 mm. There is mild atrophy of the renal parenchyma. No hydronephrosis. Stomach/Bowel: Visualized portions within the abdomen are unremarkable. Vascular/Lymphatic: No pathologically enlarged lymph nodes identified. No abdominal  aortic aneurysm demonstrated. Other:  None Musculoskeletal: There is degenerative changes of the spine. IMPRESSION: Stable appearing dilatation of the biliary tree similar to prior study and without evidence of obstructing stone or lesion. No definite evidence of acute cholangitis by MRI. Evaluation however  is limited due to respiratory motion artifact. Correlation with clinical exam recommended. Small pericholecystic fluid as seen on the prior studies. Ultrasound may provide better evaluation of the gallbladder. Stable appearing simple cystic lesion at the head of the pancreas. Electronically Signed   By: Anner Crete M.D.   On: 12/19/2015 04:17   US Abdomen Limited  Result Date: 12/20/2015 CLINICAL DATA:  Abnormal LFTs. EXAM: US ABDOMEN LIMITED - RIGHT UPPER QUADRANT COMPARISON:  MRI 12/19/2015 . FINDINGS: Gallbladder: Mild amount of gallbladder sludge cannot be excluded. Severe thickening of the gallbladder wall to 7 mm noted. Small amount of pericholecystic fluid noted. Positive ultrasound Murphy's sign noted. Findings suggest cholecystitis. Common bile duct: Diameter: 17 mm. Liver: No focal hepatic abnormality identified. Echotexture normal. Intrahepatic biliary ductal dilatation noted. Similar finding noted on prior MRI of 12/19/2015. IMPRESSION: 1. Mild of mouth of gallbladder sludge cannot be excluded. Severe thickening of the gallbladder wall up to 7 mm noted. Small amount of pericholecystic fluid noted. Positive ultrasound Murphy sign noted. Findings suggest cholecystitis. 2. Distention of the common bile duct and intrahepatic biliary ducts. Similar finding noted on prior MRI of 12/19/2015. Electronically Signed   By: Marcello Moores  Register   On: 12/20/2015 10:25   Dg Chest Portable 1 View  Result Date: 12/18/2015 CLINICAL DATA:  Chest pain.  Concern for dissection. EXAM: PORTABLE CHEST 1 VIEW COMPARISON:  02/06/2015 FINDINGS: Heart size mildly enlarged. Negative for heart failure. Mild streaky  markings in the bases most consistent with scarring or atelectasis. Negative for pneumonia or effusion. Atherosclerotic calcification in the aorta. Mediastinum not widened. IMPRESSION: Mild bibasilar atelectasis/ scarring. Electronically Signed   By: Franchot Gallo M.D.   On: 12/18/2015 18:26   Mr Abdomen Mrcp Moise Boring Contast  Result Date: 12/19/2015 CLINICAL DATA:  82 year old female presenting with abnormal LFTs. Abdominal pain and nausea and vomiting. History of prior cholangitis. EXAM: MRI ABDOMEN WITHOUT AND WITH CONTRAST (INCLUDING MRCP) TECHNIQUE: Multiplanar multisequence MR imaging of the abdomen was performed both before and after the administration of intravenous contrast. Heavily T2-weighted images of the biliary and pancreatic ducts were obtained, and three-dimensional MRCP images were rendered by post processing. CONTRAST:  19mL MULTIHANCE GADOBENATE DIMEGLUMINE 529 MG/ML IV SOLN COMPARISON:  Abdominal MRI dated 02/05/2015 FINDINGS: Evaluation of this exam is limited due to respiratory motion artifact. Lower chest: No acute findings. Hepatobiliary: The liver appears unremarkable. No abnormal enhancement or focal lesion identified. Mild intrahepatic biliary ductal dilatation. There is dilatation of the extrahepatic biliary tree and common bile duct. The common bile duct measures up to 14 mm proximally and 13 mm in diameter in the head of the pancreas. This findings are similar to the prior MRI. The gallbladder mildly distended. Trace fluid is noted adjacent to the gallbladder as seen on the prior studies. No stone identified within the gallbladder or in the CBD. Ultrasound may provide better evaluation of the gallbladder. No focal stenosis of the biliary tree. There is no significant irregularity or beaded appearance of the bile ducts. No definite abnormal enhancement to suggest cholangitis. No occlusive delivery lesions identified. Pancreas: There is a 2.5 cm simple appearing in the uncinate process  of the pancreas as seen on the prior MRI. The pancreas otherwise appears unremarkable. The bony pancreatic duct measures approximately 4 mm in diameter. Spleen:  Within normal limits in size and appearance. Adrenals/Urinary Tract: The adrenal glands appear unremarkable. Small left renal measuring up to approximately 15 mm. There is mild atrophy of the renal parenchyma. No  hydronephrosis. Stomach/Bowel: Visualized portions within the abdomen are unremarkable. Vascular/Lymphatic: No pathologically enlarged lymph nodes identified. No abdominal aortic aneurysm demonstrated. Other:  None Musculoskeletal: There is degenerative changes of the spine. IMPRESSION: Stable appearing dilatation of the biliary tree similar to prior study and without evidence of obstructing stone or lesion. No definite evidence of acute cholangitis by MRI. Evaluation however is limited due to respiratory motion artifact. Correlation with clinical exam recommended. Small pericholecystic fluid as seen on the prior studies. Ultrasound may provide better evaluation of the gallbladder. Stable appearing simple cystic lesion at the head of the pancreas. Electronically Signed   By: Anner Crete M.D.   On: 12/19/2015 04:17   Ct Angio Chest/abd/pel For Dissection W And/or Wo Contrast  Result Date: 12/18/2015 CLINICAL DATA:  Chest pain.  Incontinence.  Concern for dissection. EXAM: CT ANGIOGRAPHY CHEST, ABDOMEN AND PELVIS TECHNIQUE: Multidetector CT imaging through the chest, abdomen and pelvis was performed using the standard protocol during bolus administration of intravenous contrast. Multiplanar reconstructed images and MIPs were obtained and reviewed to evaluate the vascular anatomy. CONTRAST:  80 cc Isovue 370 intravenous COMPARISON:  MRCP 06/19/2015. FINDINGS: CTA CHEST FINDINGS Cardiovascular: Noncontrast phase shows no intramural hematoma in the aorta. No aortic dissection, aneurysm, or stenosis. Atherosclerosis, including the aorta and  coronary arteries. No cardiomegaly. No pericardial effusion. Limited evaluation the pulmonary arteries due to motion. No central filling defect identified. Mediastinum/Nodes: No adenopathy.  Negative esophagus Lungs/Pleura: Motion degraded evaluation. Mild atelectasis. There is no edema, consolidation, effusion, or pneumothorax. Musculoskeletal: See below Review of the MIP images confirms the above findings. CTA ABDOMEN AND PELVIS FINDINGS VASCULAR Aorta: Diffuse atherosclerosis.  No aneurysm or stenosis. Celiac: High-grade narrowing, with narrowing accentuated by median arcuate ligament given the morphology. Mild hypertrophy of peripancreatic collaterals. SMA: Atherosclerosis without stenosis. Renals: Atherosclerosis without suspected flow limiting stenosis. Single bilateral renal arteries. IMA: Patent Inflow: Atheromatous without stenosis or occlusion.  No aneurysm. Veins: No obvious venous abnormality within the limitations of this arterial phase study. Review of the MIP images confirms the above findings. NON-VASCULAR Hepatobiliary: No focal liver abnormality.Chronic dilatation of the intrahepatic and extrahepatic biliary tree without visible mass or choledocholithiasis. The common bile duct measures up to 19 mm. The gallbladder is distended and there is mild pericholecystic fluid, similar to 02/05/2015 MRCP. Pancreas: Chronic prominence of the main pancreatic duct measuring up to 4-5 mm. Unilocular pancreatic head cyst measuring 24 mm, which had layering secretions on previous MRI. Punctate calcifications seen dependently today. Spleen: Unremarkable. Adrenals/Urinary Tract: Negative adrenals. No hydronephrosis or stone. Simple appearing left renal cyst. The bladder appears thick walled with mild perivesicular stranding. Sensitivity is diminished by streak artifact from left hip prosthesis. Stomach/Bowel: Extensive distal colonic diverticulosis without active inflammation. No bowel obstruction. No appendicitis.  Mid duodenal diverticulum wrapping behind the pancreatic head. Lymphatic:   No mass or adenopathy. Reproductive:No pathologic findings. Other: No ascites or pneumoperitoneum. Musculoskeletal: Severe lumbar disc degeneration with extensive sclerosis and endplate irregularity. Remote L1 superior endplate fracture. No acute fracture noted. Total left hip arthroplasty without adverse finding. Review of the MIP images confirms the above findings. IMPRESSION: 1. Negative for aortic dissection or other acute arterial finding. 2. Suspect cystitis.  Correlate with urinalysis. 3. Chronic biliary tree dilatation. Reference MRCP 06/19/2015. There is mild pericholecystic fluid, raising the possibility of cholecystitis, but this appearance was also present on December 2016 MRCP. 4. Unilocular, benign-appearing pancreatic head mass that is unchanged from MRCP. 5. 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 Electronically Signed   By: Monte Fantasia M.D.   On: 12/18/2015 20:42    Impression/Plan: 82 yo with acute cholecystitis with elevated LFTs that are improving. Klebsiella bacteremia on IV Zosyn. She may have passed a stone but I doubt she has any obstructing CBD stone. She is high risk for recurrent biliary pancreatitis (no pancreatitis now) and agree that the next step is GB surgery if feasible otherwise will need a cholecystostomy tube. I do not see any role for ERCP at this time with her chronically dilated common bile duct on MRCP and improving LFTs. Supportive care. Will follow up if needed. Thank you for this consultation.    LOS: 2 days   [place] C.  12/20/2015, 4:50 PM  Pager [phone]  If no answer or after 5 PM call [phone]

## 2015-12-20 NOTE — Progress Notes (Signed)
PROGRESS NOTE    [name]  [id number] DOB: 04-18-1929 DOA: 12/18/2015 PCP: Irven Shelling, MD   Brief Narrative:  82 yo female with hx of pancreatitis in the past, presents with abdominal pain, nausea and vomiting. Sepsis syndrome and biliary duct dilatation. Admitted for MRCP and further workup. Blood cultures positive for Kleb. Pneumo. [place] suggestive cholecystitis.    Assessment & Plan:   Principal Problem:   Sepsis ([place]) Active Problems:   Uncontrolled hypertension   Type 2 diabetes mellitus with hyperglycemia (HCC)   Hyperlipidemia   Chronic diastolic heart failure (HCC)   CKD (chronic kidney disease), stage III   Lingular mass   Abnormal LFTs   Depression   1. Sepsis syndrome. Noted gram negative bacteremia with kleb, pneumo. Also positive urine culture but no pyuria. Suspected positive urine related to bacetremia. US abdomen positive for changes suggestive cholecystis, MRCP with no obstruction, will follow recommendations from GI and surgery. Will continue broad spectrum antibiotic for now, due to risk for anaerobic infection. Continue Zosyn.     2. Nausea and vomiting. Continue supportive IV fluids, antiemetics and pain control. Will change diet to clears for now.   3. Abnormal LFTs.  Persistent elevation of LFTs with ast 371, alt 344, alkaline phosphatase 155 with total bilirubins at  3,6. Will continue supportive care, follow on GI and surgery recommendations.    4. T2DM. Will continue glucose cover and monitoring, serum glucose with improve levels, down to [phone].  Sliding scale. Holding on oral hypoglycemic agents.   5. Heart failure diastolic. Clinically normovelemic,continue gentle hydration with saline at 75 cc.hr.  6. ckd stage 3. Renal function with cr down to 1.13 from 1,25, will follow on renal panel in am, will continue isotonic saline at 75 cc/hr.   7. depression. At home reported to be on lithium and mirtazapine, need to be  reviewed.   DVT prophylaxis:  scd Code Status: full  Family Communication: I spoke with patient's daughter at the bedside and all questions were addressed.   Disposition Plan: home .   Consultants:   Surgery   Gastroenterology  Procedures:    Antimicrobials:   Zosyn. #1   Subjective: Patient feeling better, positive abdominal pain, mild nausea no vomiting. No dysuria. No chest pain or dyspnea. Had pancreatitis in the past.   Objective: Vitals:   12/19/15 2001 12/20/15 0000 12/20/15 0553 12/20/15 0605  BP: (!) 151/43 (!) 150/60 (!) 154/71   Pulse:  87 84   Resp: 20 20 20    Temp: 99.3 F (37.4 C) 100.2 F (37.9 C) 98.6 F (37 C)   TempSrc: Oral Oral Oral   SpO2: 99% 94% 96%   Weight:    71.3 kg (157 lb 3 oz)  Height:        Intake/Output Summary (Last 24 hours) at 12/20/15 1129 Last data filed at 12/20/15 1115  Gross per 24 hour  Intake                0 ml  Output              128 ml  Net             -128 ml   Filed Weights   12/18/15 1420 12/19/15 0550 12/20/15 0605  Weight: 75.8 kg (167 lb) 71.4 kg (157 lb 6.5 oz) 71.3 kg (157 lb 3 oz)    Examination:  General exam: deconditioned E ENT: mild conjunctival pallor, oral mucosa dry.  Respiratory system: Clear  to auscultation. Respiratory effort normal. No wheezing, rales or rhonchi. Cardiovascular system: S1 & S2 heard, RRR. No JVD, murmurs, rubs, gallops or clicks. No pedal edema. Gastrointestinal system: Abdomen is nondistended, soft and nontender. No organomegaly or masses felt. Normal bowel sounds heard. Central nervous system: Alert and oriented. No focal neurological deficits. Extremities: Symmetric 5 x 5 power. Skin: No rashes, lesions or ulcers    Data Reviewed: I have personally reviewed following labs and imaging studies  CBC:  Recent Labs Lab 12/18/15 1435 12/19/15 0326 12/20/15 0247  WBC 11.6* 11.6* 5.8  NEUTROABS  --  9.5* 4.2  HGB 13.7 11.4* 10.8*  HCT 41.6 34.0* 32.1*  MCV  84.9 84.4 84.3  PLT 244 188 [id number]*   Basic Metabolic Panel:  Recent Labs Lab 12/18/15 1435 12/19/15 0326 12/20/15 0247  NA 138 137 139  K 3.7 3.5 3.5  CL 103 101 106  CO2 22 25 24   GLUCOSE 259* 308* 171*  BUN 9 10 6   CREATININE 1.14* 1.25* 1.13*  CALCIUM 9.4 8.6* 8.4*   GFR: Estimated Creatinine Clearance: 35.9 mL/min (by C-G formula based on SCr of 1.13 mg/dL (H)). Liver Function Tests:  Recent Labs Lab 12/18/15 1758 12/19/15 0326 12/20/15 0247  AST 530* 980* 371*  ALT 184* 473* 344*  ALKPHOS 173* 166* 155*  BILITOT 2.9* 3.8* 3.6*  PROT 6.8 5.4* 5.4*  ALBUMIN 3.7 2.9* 2.6*    Recent Labs Lab 12/18/15 1758  LIPASE 26   No results for input(s): AMMONIA in the last 168 hours. Coagulation Profile:  Recent Labs Lab 12/18/15 1758  INR 1.14   Cardiac Enzymes: No results for input(s): CKTOTAL, CKMB, CKMBINDEX, TROPONINI in the last 168 hours. BNP (last 3 results) No results for input(s): PROBNP in the last 8760 hours. HbA1C: No results for input(s): HGBA1C in the last 72 hours. CBG:  Recent Labs Lab 12/19/15 1226 12/19/15 1752 12/19/15 2127 12/20/15 0008 12/20/15 0559  GLUCAP 242* 160* 115* 93 146*   Lipid Profile: No results for input(s): CHOL, HDL, LDLCALC, TRIG, CHOLHDL, LDLDIRECT in the last 72 hours. Thyroid Function Tests: No results for input(s): TSH, T4TOTAL, FREET4, T3FREE, THYROIDAB in the last 72 hours. Anemia Panel: No results for input(s): VITAMINB12, FOLATE, FERRITIN, TIBC, IRON, RETICCTPCT in the last 72 hours. Sepsis Labs:  Recent Labs Lab 12/18/15 2039 12/18/15 2328 12/18/15 2333 12/19/15 0354  LATICACIDVEN 3.65* 2.1* 2.00* 2.4*    Recent Results (from the past 240 hour(s))  Urine culture     Status: Abnormal   Collection Time: 12/18/15  6:30 PM  Result Value Ref Range Status   Specimen Description URINE, RANDOM  Final   Special Requests NONE  Final   Culture >=100,000 COLONIES/mL KLEBSIELLA PNEUMONIAE (A)  Final    Report Status 12/20/2015 FINAL  Final   Organism ID, Bacteria KLEBSIELLA PNEUMONIAE (A)  Final      Susceptibility   Klebsiella pneumoniae - MIC*    AMPICILLIN >=32 RESISTANT Resistant     CEFAZOLIN <=4 SENSITIVE Sensitive     CEFTRIAXONE <=1 SENSITIVE Sensitive     CIPROFLOXACIN <=0.25 SENSITIVE Sensitive     GENTAMICIN <=1 SENSITIVE Sensitive     IMIPENEM 0.5 SENSITIVE Sensitive     NITROFURANTOIN 64 INTERMEDIATE Intermediate     TRIMETH/SULFA <=20 SENSITIVE Sensitive     AMPICILLIN/SULBACTAM 4 SENSITIVE Sensitive     PIP/TAZO <=4 SENSITIVE Sensitive     Extended ESBL NEGATIVE Sensitive     * >=100,000 COLONIES/mL KLEBSIELLA PNEUMONIAE  Blood Culture (  routine x 2)     Status: None (Preliminary result)   Collection Time: 12/18/15  7:00 PM  Result Value Ref Range Status   Specimen Description BLOOD RIGHT ANTECUBITAL  Final   Special Requests BOTTLES DRAWN AEROBIC AND ANAEROBIC 5CC  Final   Culture NO GROWTH < 24 HOURS  Final   Report Status PENDING  Incomplete  Blood Culture (routine x 2)     Status: None (Preliminary result)   Collection Time: 12/18/15  7:07 PM  Result Value Ref Range Status   Specimen Description BLOOD LEFT HAND  Final   Special Requests BOTTLES DRAWN AEROBIC AND ANAEROBIC 5CC  Final   Culture  Setup Time   Final    GRAM NEGATIVE RODS ANAEROBIC BOTTLE ONLY CRITICAL RESULT CALLED TO, READ BACK BY AND VERIFIED WITH: M MACCIA,PHARMD AT 1011 12/19/15 BY L BENFIELD    Culture GRAM NEGATIVE RODS  Final   Report Status PENDING  Incomplete  Blood Culture ID Panel (Reflexed)     Status: Abnormal   Collection Time: 12/18/15  7:07 PM  Result Value Ref Range Status   Enterococcus species NOT DETECTED NOT DETECTED Final   Listeria monocytogenes NOT DETECTED NOT DETECTED Final   Staphylococcus species NOT DETECTED NOT DETECTED Final   Staphylococcus aureus NOT DETECTED NOT DETECTED Final   Streptococcus species NOT DETECTED NOT DETECTED Final   Streptococcus agalactiae  NOT DETECTED NOT DETECTED Final   Streptococcus pneumoniae NOT DETECTED NOT DETECTED Final   Streptococcus pyogenes NOT DETECTED NOT DETECTED Final   Acinetobacter baumannii NOT DETECTED NOT DETECTED Final   Enterobacteriaceae species DETECTED (A) NOT DETECTED Final    Comment: CRITICAL RESULT CALLED TO, READ BACK BY AND VERIFIED WITH: M MACCIA,PHARMD AT 1011 12/19/15 BY L BENFIELD    Enterobacter cloacae complex NOT DETECTED NOT DETECTED Final   Escherichia coli NOT DETECTED NOT DETECTED Final   Klebsiella oxytoca NOT DETECTED NOT DETECTED Final   Klebsiella pneumoniae DETECTED (A) NOT DETECTED Final    Comment: CRITICAL RESULT CALLED TO, READ BACK BY AND VERIFIED WITH: M MACCIA,PHARMD AT 1011 12/19/15 BY L BENFIELD    Proteus species NOT DETECTED NOT DETECTED Final   Serratia marcescens NOT DETECTED NOT DETECTED Final   Carbapenem resistance NOT DETECTED NOT DETECTED Final   Haemophilus influenzae NOT DETECTED NOT DETECTED Final   Neisseria meningitidis NOT DETECTED NOT DETECTED Final   Pseudomonas aeruginosa NOT DETECTED NOT DETECTED Final   Candida albicans NOT DETECTED NOT DETECTED Final   Candida glabrata NOT DETECTED NOT DETECTED Final   Candida krusei NOT DETECTED NOT DETECTED Final   Candida parapsilosis NOT DETECTED NOT DETECTED Final   Candida tropicalis NOT DETECTED NOT DETECTED Final         Radiology Studies: Mr 3d Recon At Scanner  Result Date: 12/19/2015 CLINICAL DATA:  82 year old female presenting with abnormal LFTs. Abdominal pain and nausea and vomiting. History of prior cholangitis. EXAM: MRI ABDOMEN WITHOUT AND WITH CONTRAST (INCLUDING MRCP) TECHNIQUE: Multiplanar multisequence MR imaging of the abdomen was performed both before and after the administration of intravenous contrast. Heavily T2-weighted images of the biliary and pancreatic ducts were obtained, and three-dimensional MRCP images were rendered by post processing. CONTRAST:  64mL MULTIHANCE  GADOBENATE DIMEGLUMINE 529 MG/ML IV SOLN COMPARISON:  Abdominal MRI dated 02/05/2015 FINDINGS: Evaluation of this exam is limited due to respiratory motion artifact. Lower chest: No acute findings. Hepatobiliary: The liver appears unremarkable. No abnormal enhancement or focal lesion identified. Mild intrahepatic biliary ductal dilatation.  There is dilatation of the extrahepatic biliary tree and common bile duct. The common bile duct measures up to 14 mm proximally and 13 mm in diameter in the head of the pancreas. This findings are similar to the prior MRI. The gallbladder mildly distended. Trace fluid is noted adjacent to the gallbladder as seen on the prior studies. No stone identified within the gallbladder or in the CBD. Ultrasound may provide better evaluation of the gallbladder. No focal stenosis of the biliary tree. There is no significant irregularity or beaded appearance of the bile ducts. No definite abnormal enhancement to suggest cholangitis. No occlusive delivery lesions identified. Pancreas: There is a 2.5 cm simple appearing in the uncinate process of the pancreas as seen on the prior MRI. The pancreas otherwise appears unremarkable. The bony pancreatic duct measures approximately 4 mm in diameter. Spleen:  Within normal limits in size and appearance. Adrenals/Urinary Tract: The adrenal glands appear unremarkable. Small left renal measuring up to approximately 15 mm. There is mild atrophy of the renal parenchyma. No hydronephrosis. Stomach/Bowel: Visualized portions within the abdomen are unremarkable. Vascular/Lymphatic: No pathologically enlarged lymph nodes identified. No abdominal aortic aneurysm demonstrated. Other:  None Musculoskeletal: There is degenerative changes of the spine. IMPRESSION: Stable appearing dilatation of the biliary tree similar to prior study and without evidence of obstructing stone or lesion. No definite evidence of acute cholangitis by MRI. Evaluation however is limited  due to respiratory motion artifact. Correlation with clinical exam recommended. Small pericholecystic fluid as seen on the prior studies. Ultrasound may provide better evaluation of the gallbladder. Stable appearing simple cystic lesion at the head of the pancreas. Electronically Signed   By: Anner Crete M.D.   On: 12/19/2015 04:17   US Abdomen Limited  Result Date: 12/20/2015 CLINICAL DATA:  Abnormal LFTs. EXAM: US ABDOMEN LIMITED - RIGHT UPPER QUADRANT COMPARISON:  MRI 12/19/2015 . FINDINGS: Gallbladder: Mild amount of gallbladder sludge cannot be excluded. Severe thickening of the gallbladder wall to 7 mm noted. Small amount of pericholecystic fluid noted. Positive ultrasound Murphy's sign noted. Findings suggest cholecystitis. Common bile duct: Diameter: 17 mm. Liver: No focal hepatic abnormality identified. Echotexture normal. Intrahepatic biliary ductal dilatation noted. Similar finding noted on prior MRI of 12/19/2015. IMPRESSION: 1. Mild of mouth of gallbladder sludge cannot be excluded. Severe thickening of the gallbladder wall up to 7 mm noted. Small amount of pericholecystic fluid noted. Positive ultrasound Murphy sign noted. Findings suggest cholecystitis. 2. Distention of the common bile duct and intrahepatic biliary ducts. Similar finding noted on prior MRI of 12/19/2015. Electronically Signed   By: Marcello Moores  Register   On: 12/20/2015 10:25   Dg Chest Portable 1 View  Result Date: 12/18/2015 CLINICAL DATA:  Chest pain.  Concern for dissection. EXAM: PORTABLE CHEST 1 VIEW COMPARISON:  02/06/2015 FINDINGS: Heart size mildly enlarged. Negative for heart failure. Mild streaky markings in the bases most consistent with scarring or atelectasis. Negative for pneumonia or effusion. Atherosclerotic calcification in the aorta. Mediastinum not widened. IMPRESSION: Mild bibasilar atelectasis/ scarring. Electronically Signed   By: Franchot Gallo M.D.   On: 12/18/2015 18:26   Mr Abdomen Mrcp Moise Boring  Contast  Result Date: 12/19/2015 CLINICAL DATA:  82 year old female presenting with abnormal LFTs. Abdominal pain and nausea and vomiting. History of prior cholangitis. EXAM: MRI ABDOMEN WITHOUT AND WITH CONTRAST (INCLUDING MRCP) TECHNIQUE: Multiplanar multisequence MR imaging of the abdomen was performed both before and after the administration of intravenous contrast. Heavily T2-weighted images of the biliary and pancreatic  ducts were obtained, and three-dimensional MRCP images were rendered by post processing. CONTRAST:  11mL MULTIHANCE GADOBENATE DIMEGLUMINE 529 MG/ML IV SOLN COMPARISON:  Abdominal MRI dated 02/05/2015 FINDINGS: Evaluation of this exam is limited due to respiratory motion artifact. Lower chest: No acute findings. Hepatobiliary: The liver appears unremarkable. No abnormal enhancement or focal lesion identified. Mild intrahepatic biliary ductal dilatation. There is dilatation of the extrahepatic biliary tree and common bile duct. The common bile duct measures up to 14 mm proximally and 13 mm in diameter in the head of the pancreas. This findings are similar to the prior MRI. The gallbladder mildly distended. Trace fluid is noted adjacent to the gallbladder as seen on the prior studies. No stone identified within the gallbladder or in the CBD. Ultrasound may provide better evaluation of the gallbladder. No focal stenosis of the biliary tree. There is no significant irregularity or beaded appearance of the bile ducts. No definite abnormal enhancement to suggest cholangitis. No occlusive delivery lesions identified. Pancreas: There is a 2.5 cm simple appearing in the uncinate process of the pancreas as seen on the prior MRI. The pancreas otherwise appears unremarkable. The bony pancreatic duct measures approximately 4 mm in diameter. Spleen:  Within normal limits in size and appearance. Adrenals/Urinary Tract: The adrenal glands appear unremarkable. Small left renal measuring up to approximately  15 mm. There is mild atrophy of the renal parenchyma. No hydronephrosis. Stomach/Bowel: Visualized portions within the abdomen are unremarkable. Vascular/Lymphatic: No pathologically enlarged lymph nodes identified. No abdominal aortic aneurysm demonstrated. Other:  None Musculoskeletal: There is degenerative changes of the spine. IMPRESSION: Stable appearing dilatation of the biliary tree similar to prior study and without evidence of obstructing stone or lesion. No definite evidence of acute cholangitis by MRI. Evaluation however is limited due to respiratory motion artifact. Correlation with clinical exam recommended. Small pericholecystic fluid as seen on the prior studies. Ultrasound may provide better evaluation of the gallbladder. Stable appearing simple cystic lesion at the head of the pancreas. Electronically Signed   By: Anner Crete M.D.   On: 12/19/2015 04:17   Ct Angio Chest/abd/pel For Dissection W And/or Wo Contrast  Result Date: 12/18/2015 CLINICAL DATA:  Chest pain.  Incontinence.  Concern for dissection. EXAM: CT ANGIOGRAPHY CHEST, ABDOMEN AND PELVIS TECHNIQUE: Multidetector CT imaging through the chest, abdomen and pelvis was performed using the standard protocol during bolus administration of intravenous contrast. Multiplanar reconstructed images and MIPs were obtained and reviewed to evaluate the vascular anatomy. CONTRAST:  80 cc Isovue 370 intravenous COMPARISON:  MRCP 06/19/2015. FINDINGS: CTA CHEST FINDINGS Cardiovascular: Noncontrast phase shows no intramural hematoma in the aorta. No aortic dissection, aneurysm, or stenosis. Atherosclerosis, including the aorta and coronary arteries. No cardiomegaly. No pericardial effusion. Limited evaluation the pulmonary arteries due to motion. No central filling defect identified. Mediastinum/Nodes: No adenopathy.  Negative esophagus Lungs/Pleura: Motion degraded evaluation. Mild atelectasis. There is no edema, consolidation, effusion, or  pneumothorax. Musculoskeletal: See below Review of the MIP images confirms the above findings. CTA ABDOMEN AND PELVIS FINDINGS VASCULAR Aorta: Diffuse atherosclerosis.  No aneurysm or stenosis. Celiac: High-grade narrowing, with narrowing accentuated by median arcuate ligament given the morphology. Mild hypertrophy of peripancreatic collaterals. SMA: Atherosclerosis without stenosis. Renals: Atherosclerosis without suspected flow limiting stenosis. Single bilateral renal arteries. IMA: Patent Inflow: Atheromatous without stenosis or occlusion.  No aneurysm. Veins: No obvious venous abnormality within the limitations of this arterial phase study. Review of the MIP images confirms the above findings. NON-VASCULAR Hepatobiliary: No focal liver  abnormality.Chronic dilatation of the intrahepatic and extrahepatic biliary tree without visible mass or choledocholithiasis. The common bile duct measures up to 19 mm. The gallbladder is distended and there is mild pericholecystic fluid, similar to 02/05/2015 MRCP. Pancreas: Chronic prominence of the main pancreatic duct measuring up to 4-5 mm. Unilocular pancreatic head cyst measuring 24 mm, which had layering secretions on previous MRI. Punctate calcifications seen dependently today. Spleen: Unremarkable. Adrenals/Urinary Tract: Negative adrenals. No hydronephrosis or stone. Simple appearing left renal cyst. The bladder appears thick walled with mild perivesicular stranding. Sensitivity is diminished by streak artifact from left hip prosthesis. Stomach/Bowel: Extensive distal colonic diverticulosis without active inflammation. No bowel obstruction. No appendicitis. Mid duodenal diverticulum wrapping behind the pancreatic head. Lymphatic:   No mass or adenopathy. Reproductive:No pathologic findings. Other: No ascites or pneumoperitoneum. Musculoskeletal: Severe lumbar disc degeneration with extensive sclerosis and endplate irregularity. Remote L1 superior endplate fracture. No  acute fracture noted. Total left hip arthroplasty without adverse finding. Review of the MIP images confirms the above findings. IMPRESSION: 1. Negative for aortic dissection or other acute arterial finding. 2. Suspect cystitis.  Correlate with urinalysis. 3. Chronic biliary tree dilatation. Reference MRCP 06/19/2015. There is mild pericholecystic fluid, raising the possibility of cholecystitis, but this appearance was also present on December 2016 MRCP. 4. Unilocular, benign-appearing pancreatic head mass that is unchanged from MRCP. 5. 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 Electronically Signed   By: Monte Fantasia M.D.   On: 12/18/2015 20:42        Scheduled Meds: . cefTRIAXone (ROCEPHIN)  IV  2 g Intravenous Q24H  . famotidine (PEPCID) IV  20 mg Intravenous Q12H  . fentaNYL (SUBLIMAZE) injection  50 mcg Intravenous Once  . Influenza vac split quadrivalent PF  0.5 mL Intramuscular Tomorrow-1000  . insulin aspart  0-9 Units Subcutaneous TID WC  . pneumococcal 23 valent vaccine  0.5 mL Intramuscular Tomorrow-1000  . sodium chloride flush  3 mL Intravenous Q12H   Continuous Infusions: . sodium chloride 75 mL/hr at 12/19/15 2306     LOS: 2 days        Tawni Millers, MD [facility] Pager [phone]  If 7PM-7AM, please contact night-coverage [email or web address] Password [email or web address] 12/20/2015, 11:29 AM

## 2015-12-20 NOTE — Progress Notes (Addendum)
Patient ID: [name], female   DOB: 03-10-1929, 82 y.o.   MRN: [id number]   LOS: 2 days   Subjective: Taelyn is well-known to the [facility] service. We last saw her in late 2016 for pancreatitis. At that time her GB did not seem to be involved.  She came to the ED this time for epigastric pain, N/V for 3d duration. No aggravating/alleviating factors. She has had several similar episodes since December for which she did not seek care that resolved on their own after a few days. She was admitted this time with sepsis and has tested positive for a Klebsiella bacteremia.  An [place] (12/20/2015) was c/w acute acalculous cholecystitis. MRCP (12/19/2015) showed dilated biliary system but this is chronic and no worse than on her last one. LFT's were elevated on admission but are trending down; lipase is normal. Her N/V has improved but her pain is unchanged.   Objective: Vital signs in last 24 hours: Temp:  [98.6 F (37 C)-100.2 F (37.9 C)] 99.5 F (37.5 C) (10/17 1148) Pulse Rate:  [78-100] 100 (10/17 1148) Resp:  [20] 20 (10/17 1148) BP: (147-168)/(43-87) 168/87 (10/17 1148) SpO2:  [94 %-100 %] 100 % (10/17 1148) Weight:  [71.3 kg (157 lb 3 oz)] 71.3 kg (157 lb 3 oz) (10/17 0605) Last BM Date: 12/19/15   Laboratory  CBC  Recent Labs  12/19/15 0326 12/20/15 0247  WBC 11.6* 5.8  HGB 11.4* 10.8*  HCT 34.0* 32.1*  PLT 188 133*   BMET  Recent Labs  12/19/15 0326 12/20/15 0247  NA 137 139  K 3.5 3.5  CL 101 106  CO2 25 24  GLUCOSE 308* 171*  BUN 10 6  CREATININE 1.25* 1.13*  CALCIUM 8.6* 8.4*   Hepatic Function Latest Ref Rng & Units 12/20/2015 12/19/2015 12/18/2015  Total Protein 6.5 - 8.1 g/dL 5.4(L) 5.4(L) 6.8  Albumin 3.5 - 5.0 g/dL 2.6(L) 2.9(L) 3.7  AST 15 - 41 U/L 371(H) 980(H) 530(H)  ALT 14 - 54 U/L 344(H) 473(H) 184(H)  Alk Phosphatase 38 - 126 U/L 155(H) 166(H) 173(H)  Total Bilirubin 0.3 - 1.2 mg/dL 3.6(H) 3.8(H) 2.9(H)  Bilirubin, Direct 0.1 - 0.5 mg/dL - - 1.8(H)       Physical Exam General appearance: alert and no distress Resp: clear to auscultation bilaterally Cardio: regular rate and rhythm GI: Soft, +BS, diffuse mild TTP, worse in BUQ and epigastrium, no definite Murphy's sign   Assessment/Plan: Acute cholecystitis -- I think given her history of biliary colic and this admissions findings that lap chole is indicated. I will discuss this with Dr. Lucia Gaskins. In the meantime I will make her NPO after MN and consent her for lap chole possibly tomorrow. Will check LFT's again in the morning to make sure they are trending downward. Continue broad spectrum abx.  Lisette Abu, PA-C Pager: [phone] 12/20/2015   Agree with above. Was noted to have an enlarged CBD in 02/04/2016.   Seen by Dr. Margurite Auerbach at that time.  She was noted to have a pancreatic cystic lesion in the head of the pancreas and chronic bile duct dilatation.  Her most recent MRCP (12/19/2015) shows the CBD about the same. Seen by Dr. Michail Sermon during this admission.  I discussed with the patient the indications and risks of gall bladder surgery.  The primary risks of gall bladder surgery include, but are not limited to, bleeding, infection, common bile duct injury, and open surgery.  There is also the risk that the  patient may have continued symptoms after surgery.  We discussed the typical post-operative recovery course. I tried to answer the patient's questions.  There is a chance that I can not get to her surgery tomorrow.  I have explained this to the patient.  If I cannot do her surgery tomorrow, will reset for Thursday.  She is married and has 3 daughters.  None are here at this time.  Alphonsa Overall, MD, [facility] Surgery Pager: [phone] Office phone:  [phone]

## 2015-12-21 ENCOUNTER — Encounter (HOSPITAL_COMMUNITY): Payer: Self-pay | Admitting: Critical Care Medicine

## 2015-12-21 ENCOUNTER — Inpatient Hospital Stay (HOSPITAL_COMMUNITY): Payer: PPO

## 2015-12-21 ENCOUNTER — Inpatient Hospital Stay (HOSPITAL_COMMUNITY): Payer: PPO | Admitting: Critical Care Medicine

## 2015-12-21 ENCOUNTER — Encounter (HOSPITAL_COMMUNITY)
Admission: EM | Disposition: A | Discharge status: Home / Self Care | Payer: Self-pay | Source: Home / Self Care | Attending: Internal Medicine

## 2015-12-21 DIAGNOSIS — R109 Unspecified abdominal pain: Secondary | ICD-10-CM

## 2015-12-21 DIAGNOSIS — R1013 Epigastric pain: Secondary | ICD-10-CM | POA: Diagnosis not present

## 2015-12-21 DIAGNOSIS — E872 Acidosis: Secondary | ICD-10-CM | POA: Diagnosis not present

## 2015-12-21 DIAGNOSIS — E1165 Type 2 diabetes mellitus with hyperglycemia: Secondary | ICD-10-CM | POA: Diagnosis not present

## 2015-12-21 DIAGNOSIS — Z7984 Long term (current) use of oral hypoglycemic drugs: Secondary | ICD-10-CM | POA: Diagnosis not present

## 2015-12-21 DIAGNOSIS — I5032 Chronic diastolic (congestive) heart failure: Secondary | ICD-10-CM | POA: Diagnosis not present

## 2015-12-21 DIAGNOSIS — K838 Other specified diseases of biliary tract: Secondary | ICD-10-CM | POA: Diagnosis not present

## 2015-12-21 DIAGNOSIS — A419 Sepsis, unspecified organism: Secondary | ICD-10-CM | POA: Diagnosis not present

## 2015-12-21 DIAGNOSIS — K819 Cholecystitis, unspecified: Secondary | ICD-10-CM | POA: Diagnosis not present

## 2015-12-21 DIAGNOSIS — E785 Hyperlipidemia, unspecified: Secondary | ICD-10-CM | POA: Diagnosis not present

## 2015-12-21 DIAGNOSIS — K811 Chronic cholecystitis: Secondary | ICD-10-CM | POA: Diagnosis not present

## 2015-12-21 DIAGNOSIS — R112 Nausea with vomiting, unspecified: Secondary | ICD-10-CM | POA: Diagnosis not present

## 2015-12-21 DIAGNOSIS — N183 Chronic kidney disease, stage 3 (moderate): Secondary | ICD-10-CM

## 2015-12-21 DIAGNOSIS — F329 Major depressive disorder, single episode, unspecified: Secondary | ICD-10-CM

## 2015-12-21 DIAGNOSIS — R7989 Other specified abnormal findings of blood chemistry: Secondary | ICD-10-CM | POA: Diagnosis not present

## 2015-12-21 DIAGNOSIS — E1122 Type 2 diabetes mellitus with diabetic chronic kidney disease: Secondary | ICD-10-CM | POA: Diagnosis not present

## 2015-12-21 DIAGNOSIS — E119 Type 2 diabetes mellitus without complications: Secondary | ICD-10-CM | POA: Diagnosis not present

## 2015-12-21 DIAGNOSIS — I10 Essential (primary) hypertension: Secondary | ICD-10-CM | POA: Diagnosis not present

## 2015-12-21 DIAGNOSIS — I959 Hypotension, unspecified: Secondary | ICD-10-CM | POA: Diagnosis not present

## 2015-12-21 DIAGNOSIS — Z7982 Long term (current) use of aspirin: Secondary | ICD-10-CM | POA: Diagnosis not present

## 2015-12-21 DIAGNOSIS — R81 Glycosuria: Secondary | ICD-10-CM | POA: Diagnosis not present

## 2015-12-21 DIAGNOSIS — I130 Hypertensive heart and chronic kidney disease with heart failure and stage 1 through stage 4 chronic kidney disease, or unspecified chronic kidney disease: Secondary | ICD-10-CM | POA: Diagnosis not present

## 2015-12-21 DIAGNOSIS — A4159 Other Gram-negative sepsis: Secondary | ICD-10-CM | POA: Diagnosis not present

## 2015-12-21 DIAGNOSIS — E860 Dehydration: Secondary | ICD-10-CM | POA: Diagnosis not present

## 2015-12-21 HISTORY — PX: CHOLECYSTECTOMY: SHX55

## 2015-12-21 LAB — CBC WITH DIFFERENTIAL/PLATELET
BASOS ABS: 0 10*3/uL (ref 0.0–0.1)
Basophils Relative: 0 %
EOS ABS: 0.1 10*3/uL (ref 0.0–0.7)
Eosinophils Relative: 3 %
HCT: 36.8 % (ref 36.0–46.0)
Hemoglobin: 12.1 g/dL (ref 12.0–15.0)
LYMPHS ABS: 1.1 10*3/uL (ref 0.7–4.0)
LYMPHS PCT: 22 %
MCH: 27.7 pg (ref 26.0–34.0)
MCHC: 32.9 g/dL (ref 30.0–36.0)
MCV: 84.2 fL (ref 78.0–100.0)
MONO ABS: 0.6 10*3/uL (ref 0.1–1.0)
Monocytes Relative: 13 %
NEUTROS ABS: 3 10*3/uL (ref 1.7–7.7)
Neutrophils Relative %: 62 %
PLATELETS: 174 10*3/uL (ref 150–400)
RBC: 4.37 MIL/uL (ref 3.87–5.11)
RDW: 14.2 % (ref 11.5–15.5)
WBC: 4.8 10*3/uL (ref 4.0–10.5)

## 2015-12-21 LAB — COMPREHENSIVE METABOLIC PANEL
ALBUMIN: 2.7 g/dL — AB (ref 3.5–5.0)
ALT: 242 U/L — ABNORMAL HIGH (ref 14–54)
ANION GAP: 9 (ref 5–15)
AST: 124 U/L — ABNORMAL HIGH (ref 15–41)
Alkaline Phosphatase: 166 U/L — ABNORMAL HIGH (ref 38–126)
BUN: 6 mg/dL (ref 6–20)
CO2: 25 mmol/L (ref 22–32)
Calcium: 9.2 mg/dL (ref 8.9–10.3)
Chloride: 110 mmol/L (ref 101–111)
Creatinine, Ser: 1.04 mg/dL — ABNORMAL HIGH (ref 0.44–1.00)
GFR calc Af Amer: 56 mL/min — ABNORMAL LOW (ref >60)
GFR calc non Af Amer: 48 mL/min — ABNORMAL LOW (ref >60)
GLUCOSE: 184 mg/dL — AB (ref 65–99)
POTASSIUM: 3.7 mmol/L (ref 3.5–5.1)
SODIUM: 144 mmol/L (ref 135–145)
TOTAL PROTEIN: 5.8 g/dL — AB (ref 6.5–8.1)
Total Bilirubin: 1.9 mg/dL — ABNORMAL HIGH (ref 0.3–1.2)

## 2015-12-21 LAB — GLUCOSE, CAPILLARY
GLUCOSE-CAPILLARY: 161 mg/dL — AB (ref 65–99)
GLUCOSE-CAPILLARY: 183 mg/dL — AB (ref 65–99)
GLUCOSE-CAPILLARY: 354 mg/dL — AB (ref 65–99)
Glucose-Capillary: 105 mg/dL — ABNORMAL HIGH (ref 65–99)
Glucose-Capillary: 149 mg/dL — ABNORMAL HIGH (ref 65–99)

## 2015-12-21 LAB — CULTURE, BLOOD (ROUTINE X 2)

## 2015-12-21 LAB — SURGICAL PCR SCREEN
MRSA, PCR: NEGATIVE
Staphylococcus aureus: NEGATIVE

## 2015-12-21 SURGERY — LAPAROSCOPIC CHOLECYSTECTOMY WITH INTRAOPERATIVE CHOLANGIOGRAM
Anesthesia: General | Site: Abdomen

## 2015-12-21 MED ORDER — MEPERIDINE HCL 25 MG/ML IJ SOLN
6.2500 mg | INTRAMUSCULAR | Status: DC | PRN
  Administered 2015-12-21 (×2): 12.5 mg via INTRAVENOUS

## 2015-12-21 MED ORDER — MEPERIDINE HCL 25 MG/ML IJ SOLN: 6.2500 mg | INTRAMUSCULAR | Status: DC | PRN

## 2015-12-21 MED ORDER — ROCURONIUM BROMIDE 100 MG/10ML IV SOLN
INTRAVENOUS | Status: DC | PRN
  Administered 2015-12-21: 50 mg via INTRAVENOUS

## 2015-12-21 MED ORDER — HYDROMORPHONE HCL 2 MG/ML IJ SOLN
0.2500 mg | INTRAMUSCULAR | Status: DC | PRN
  Administered 2015-12-21: 0.3 mg via INTRAVENOUS

## 2015-12-21 MED ORDER — HYDROMORPHONE HCL 2 MG/ML IJ SOLN: 0.2500 mg | INTRAMUSCULAR | Status: DC | PRN

## 2015-12-21 MED ORDER — ROCURONIUM BROMIDE 10 MG/ML (PF) SYRINGE
PREFILLED_SYRINGE | INTRAVENOUS | Status: AC
  Filled 2015-12-21: qty 10

## 2015-12-21 MED ORDER — PROPOFOL 10 MG/ML IV BOLUS
INTRAVENOUS | Status: DC | PRN
  Administered 2015-12-21: 120 mg via INTRAVENOUS

## 2015-12-21 MED ORDER — LACTATED RINGERS IV SOLN: INTRAVENOUS | Status: DC

## 2015-12-21 MED ORDER — FENTANYL CITRATE (PF) 100 MCG/2ML IJ SOLN
INTRAMUSCULAR | Status: AC
  Filled 2015-12-21: qty 2

## 2015-12-21 MED ORDER — FENTANYL CITRATE (PF) 100 MCG/2ML IJ SOLN
INTRAMUSCULAR | Status: DC | PRN
  Administered 2015-12-21 (×5): 50 ug via INTRAVENOUS

## 2015-12-21 MED ORDER — FENTANYL CITRATE (PF) 100 MCG/2ML IJ SOLN
INTRAMUSCULAR | Status: AC
  Filled 2015-12-21: qty 4

## 2015-12-21 MED ORDER — ONDANSETRON HCL 4 MG/2ML IJ SOLN
INTRAMUSCULAR | Status: DC | PRN
Start: 2015-12-21 — End: 2015-12-21
  Administered 2015-12-21: 4 mg via INTRAVENOUS

## 2015-12-21 MED ORDER — ONDANSETRON HCL 4 MG/2ML IJ SOLN: 4.0000 mg | Freq: Once | INTRAMUSCULAR | Status: DC | PRN

## 2015-12-21 MED ORDER — ONDANSETRON HCL 4 MG/2ML IJ SOLN
INTRAMUSCULAR | Status: AC
  Filled 2015-12-21: qty 4

## 2015-12-21 MED ORDER — DEXAMETHASONE SODIUM PHOSPHATE 10 MG/ML IJ SOLN
INTRAMUSCULAR | Status: AC
  Filled 2015-12-21: qty 2

## 2015-12-21 MED ORDER — MORPHINE SULFATE (PF) 2 MG/ML IV SOLN
1.0000 mg | INTRAVENOUS | Status: DC | PRN
  Administered 2015-12-21: 2 mg via INTRAVENOUS
  Filled 2015-12-21: qty 1

## 2015-12-21 MED ORDER — PHENYLEPHRINE 40 MCG/ML (10ML) SYRINGE FOR IV PUSH (FOR BLOOD PRESSURE SUPPORT)
PREFILLED_SYRINGE | INTRAVENOUS | Status: DC | PRN
  Administered 2015-12-21: 40 ug via INTRAVENOUS

## 2015-12-21 MED ORDER — ARTIFICIAL TEARS OP OINT
TOPICAL_OINTMENT | OPHTHALMIC | Status: AC
  Filled 2015-12-21: qty 3.5

## 2015-12-21 MED ORDER — MEPERIDINE HCL 25 MG/ML IJ SOLN
INTRAMUSCULAR | Status: AC
  Filled 2015-12-21: qty 1

## 2015-12-21 MED ORDER — IOPAMIDOL (ISOVUE-300) INJECTION 61%
INTRAVENOUS | Status: AC
  Filled 2015-12-21: qty 50

## 2015-12-21 MED ORDER — DEXAMETHASONE SODIUM PHOSPHATE 10 MG/ML IJ SOLN
INTRAMUSCULAR | Status: DC | PRN
  Administered 2015-12-21: 4 mg via INTRAVENOUS

## 2015-12-21 MED ORDER — SUGAMMADEX SODIUM 200 MG/2ML IV SOLN
INTRAVENOUS | Status: DC | PRN
  Administered 2015-12-21: 200 mg via INTRAVENOUS

## 2015-12-21 MED ORDER — PHENYLEPHRINE HCL 10 MG/ML IJ SOLN
INTRAVENOUS | Status: DC | PRN
  Administered 2015-12-21: 15 ug/min via INTRAVENOUS

## 2015-12-21 MED ORDER — PHENYLEPHRINE 40 MCG/ML (10ML) SYRINGE FOR IV PUSH (FOR BLOOD PRESSURE SUPPORT)
PREFILLED_SYRINGE | INTRAVENOUS | Status: AC
  Filled 2015-12-21: qty 20

## 2015-12-21 MED ORDER — SODIUM CHLORIDE 0.9 % IV SOLN
INTRAVENOUS | Status: DC | PRN
  Administered 2015-12-21: 31 mL

## 2015-12-21 MED ORDER — HYDROMORPHONE HCL 2 MG/ML IJ SOLN
INTRAMUSCULAR | Status: AC
  Filled 2015-12-21: qty 1

## 2015-12-21 MED ORDER — LIDOCAINE HCL (CARDIAC) 20 MG/ML IV SOLN
INTRAVENOUS | Status: DC | PRN
  Administered 2015-12-21: 100 mg via INTRAVENOUS

## 2015-12-21 MED ORDER — DEXAMETHASONE SODIUM PHOSPHATE 10 MG/ML IJ SOLN
INTRAMUSCULAR | Status: AC
  Filled 2015-12-21: qty 1

## 2015-12-21 MED ORDER — BUPIVACAINE-EPINEPHRINE (PF) 0.25% -1:200000 IJ SOLN
INTRAMUSCULAR | Status: AC
  Filled 2015-12-21: qty 30

## 2015-12-21 MED ORDER — 0.9 % SODIUM CHLORIDE (POUR BTL) OPTIME
TOPICAL | Status: DC | PRN
  Administered 2015-12-21: 1000 mL

## 2015-12-21 MED ORDER — ONDANSETRON HCL 4 MG/2ML IJ SOLN
INTRAMUSCULAR | Status: AC
  Filled 2015-12-21: qty 2

## 2015-12-21 MED ORDER — PROPOFOL 10 MG/ML IV BOLUS
INTRAVENOUS | Status: AC
  Filled 2015-12-21: qty 20

## 2015-12-21 MED ORDER — SODIUM CHLORIDE 0.9 % IJ SOLN
INTRAMUSCULAR | Status: AC
  Filled 2015-12-21: qty 10

## 2015-12-21 MED ORDER — LACTATED RINGERS IV SOLN
INTRAVENOUS | Status: DC | PRN
  Administered 2015-12-21: 13:00:00 via INTRAVENOUS

## 2015-12-21 MED ORDER — BUPIVACAINE-EPINEPHRINE 0.25% -1:200000 IJ SOLN
INTRAMUSCULAR | Status: DC | PRN
  Administered 2015-12-21: 30 mL

## 2015-12-21 MED ORDER — SODIUM CHLORIDE 0.9 % IR SOLN
Status: DC | PRN
  Administered 2015-12-21: 1000 mL

## 2015-12-21 MED ORDER — LIDOCAINE 2% (20 MG/ML) 5 ML SYRINGE
INTRAMUSCULAR | Status: AC
  Filled 2015-12-21: qty 5

## 2015-12-21 SURGICAL SUPPLY — 54 items
ADH SKN CLS APL DERMABOND .7 (GAUZE/BANDAGES/DRESSINGS)
APPLIER CLIP 5 13 M/L LIGAMAX5 (MISCELLANEOUS)
APPLIER CLIP ROT 10 11.4 M/L (STAPLE) ×2
APR CLP MED LRG 11.4X10 (STAPLE) ×1
APR CLP MED LRG 5 ANG JAW (MISCELLANEOUS)
BAG SPEC RTRVL LRG 6X4 10 (ENDOMECHANICALS) ×1
BLADE SURG ROTATE 9660 (MISCELLANEOUS) IMPLANT
CANISTER SUCTION 2500CC (MISCELLANEOUS) ×2 IMPLANT
CHLORAPREP W/TINT 26ML (MISCELLANEOUS) ×2 IMPLANT
CHOLANGIOGRAM CATH TAUT (CATHETERS) ×2 IMPLANT
CLIP APPLIE 5 13 M/L LIGAMAX5 (MISCELLANEOUS) IMPLANT
CLIP APPLIE ROT 10 11.4 M/L (STAPLE) IMPLANT
COVER MAYO STAND STRL (DRAPES) ×2 IMPLANT
COVER SURGICAL LIGHT HANDLE (MISCELLANEOUS) ×2 IMPLANT
DERMABOND ADVANCED (GAUZE/BANDAGES/DRESSINGS)
DERMABOND ADVANCED .7 DNX12 (GAUZE/BANDAGES/DRESSINGS) ×1 IMPLANT
DRAPE C-ARM 42X72 X-RAY (DRAPES) ×2 IMPLANT
ELECT REM PT RETURN 9FT ADLT (ELECTROSURGICAL) ×2
ELECTRODE REM PT RTRN 9FT ADLT (ELECTROSURGICAL) ×1 IMPLANT
ENDOLOOP SUT PDS II  0 18 (SUTURE) ×2
ENDOLOOP SUT PDS II 0 18 (SUTURE) IMPLANT
FILTER SMOKE EVAC LAPAROSHD (FILTER) ×2 IMPLANT
GLOVE BIO SURGEON STRL SZ7 (GLOVE) ×1 IMPLANT
GLOVE BIO SURGEON STRL SZ7.5 (GLOVE) ×1 IMPLANT
GLOVE BIOGEL PI IND STRL 7.0 (GLOVE) IMPLANT
GLOVE BIOGEL PI IND STRL 7.5 (GLOVE) IMPLANT
GLOVE BIOGEL PI INDICATOR 7.0 (GLOVE) ×2
GLOVE BIOGEL PI INDICATOR 7.5 (GLOVE) ×1
GLOVE ECLIPSE 7.0 STRL STRAW (GLOVE) ×1 IMPLANT
GLOVE SURG SIGNA 7.5 PF LTX (GLOVE) ×2 IMPLANT
GOWN STRL REUS W/ TWL LRG LVL3 (GOWN DISPOSABLE) ×2 IMPLANT
GOWN STRL REUS W/ TWL XL LVL3 (GOWN DISPOSABLE) ×1 IMPLANT
GOWN STRL REUS W/TWL LRG LVL3 (GOWN DISPOSABLE) ×6
GOWN STRL REUS W/TWL XL LVL3 (GOWN DISPOSABLE) ×2
IV CATH 14GX2 1/4 (CATHETERS) ×2 IMPLANT
KIT BASIN OR (CUSTOM PROCEDURE TRAY) ×2 IMPLANT
KIT ROOM TURNOVER OR (KITS) ×2 IMPLANT
NS IRRIG 1000ML POUR BTL (IV SOLUTION) ×2 IMPLANT
PAD ARMBOARD 7.5X6 YLW CONV (MISCELLANEOUS) ×2 IMPLANT
POUCH SPECIMEN RETRIEVAL 10MM (ENDOMECHANICALS) ×2 IMPLANT
SCISSORS LAP 5X35 DISP (ENDOMECHANICALS) ×2 IMPLANT
SET IRRIG TUBING LAPAROSCOPIC (IRRIGATION / IRRIGATOR) ×2 IMPLANT
SLEEVE ENDOPATH XCEL 5M (ENDOMECHANICALS) ×2 IMPLANT
SPECIMEN JAR SMALL (MISCELLANEOUS) ×2 IMPLANT
STOPCOCK 4 WAY LG BORE MALE ST (IV SETS) ×2 IMPLANT
SUT MNCRL AB 4-0 PS2 18 (SUTURE) ×2 IMPLANT
TOWEL OR 17X24 6PK STRL BLUE (TOWEL DISPOSABLE) ×2 IMPLANT
TOWEL OR 17X26 10 PK STRL BLUE (TOWEL DISPOSABLE) ×2 IMPLANT
TRAY LAPAROSCOPIC MC (CUSTOM PROCEDURE TRAY) ×2 IMPLANT
TROCAR XCEL BLUNT TIP 100MML (ENDOMECHANICALS) ×2 IMPLANT
TROCAR XCEL NON-BLD 11X100MML (ENDOMECHANICALS) ×1 IMPLANT
TROCAR XCEL NON-BLD 5MMX100MML (ENDOMECHANICALS) ×2 IMPLANT
TUBING EXTENTION W/L.L. (IV SETS) ×2 IMPLANT
TUBING INSUFFLATION (TUBING) ×2 IMPLANT

## 2015-12-21 NOTE — Anesthesia Postprocedure Evaluation (Signed)
Anesthesia Post Note  Patient: [name]  Procedure(s) Performed: Procedure(s) (LRB): LAPAROSCOPIC CHOLECYSTECTOMY WITH INTRAOPERATIVE CHOLANGIOGRAM (N/A)  Patient location during evaluation: PACU Anesthesia Type: General Level of consciousness: awake and alert Pain management: pain level controlled Vital Signs Assessment: post-procedure vital signs reviewed and stable Respiratory status: spontaneous breathing, nonlabored ventilation, respiratory function stable and patient connected to nasal cannula oxygen Cardiovascular status: blood pressure returned to baseline and stable Postop Assessment: no signs of nausea or vomiting Anesthetic complications: no    Last Vitals:  Vitals:   12/21/15 0546 12/21/15 1434  BP: 138/63   Pulse: 98   Resp:    Temp: 36.8 C 36.6 C    Last Pain:  Vitals:   12/21/15 0546  TempSrc: Oral  PainSc:                  Reginal Lutes

## 2015-12-21 NOTE — Transfer of Care (Signed)
Immediate Anesthesia Transfer of Care Note  Patient: [name]  Procedure(s) Performed: Procedure(s): LAPAROSCOPIC CHOLECYSTECTOMY WITH INTRAOPERATIVE CHOLANGIOGRAM (N/A)  Patient Location: PACU  Anesthesia Type:General  Level of Consciousness: awake, alert  and oriented  Airway & Oxygen Therapy: Patient Spontanous Breathing and Patient connected to nasal cannula oxygen  Post-op Assessment: Report given to RN and Post -op Vital signs reviewed and stable  Post vital signs: Reviewed and stable  Last Vitals:  Vitals:   12/20/15 2036 12/21/15 0546  BP: (!) 169/57 138/63  Pulse: 80 98  Resp: 20   Temp: 37.1 C 36.8 C    Last Pain:  Vitals:   12/21/15 0546  TempSrc: Oral  PainSc:          Complications: No apparent anesthesia complications

## 2015-12-21 NOTE — Op Note (Addendum)
12/18/2015 - 12/21/2015  2:19 PM  PATIENT:  [name], 82 y.o., female, MRN: [id number]  PREOP DIAGNOSIS:  CHOLECYSTITIS  POSTOP DIAGNOSIS:   Maybe mild cholecystitis, cholelithiasis, very enlarged CBD (she had one 4 mm stone float out of the cystic duct) I don't think that her gall bladder explains her Klebsiella sepsis.  PROCEDURE:   Procedure(s):  LAPAROSCOPIC CHOLECYSTECTOMY WITH INTRAOPERATIVE CHOLANGIOGRAM  SURGEON:   Alphonsa Overall, M.D.  ASSISTANT:   None  ANESTHESIA:   general  Anesthesiologist: Lillia Abed, MD; Reginal Lutes, MD CRNA: Wilburn Cornelia, CRNA; Carney Living, CRNA  General  ASA:  3  EBL:  minimal  ml  BLOOD ADMINISTERED: none  DRAINS: none   LOCAL MEDICATIONS USED:   30 cc 1/4% Marcaine  SPECIMEN:   Gall bladder  COUNTS CORRECT:  YES  INDICATIONS FOR PROCEDURE:  [name] is a 82 y.o. (DOB: [birth year]) white female whose primary care physician is Jane Shelling, MD and comes for cholecystectomy.   The indications and risks of the gall bladder surgery were explained to the patient.  The risks include, but are not limited to, infection, bleeding, common bile duct injury and open surgery.  SURGERY:  The patient was taken to OR room #1 at [facility].  The abdomen was prepped with chloroprep.  The patient was on Zosyn prior to the beginning of the operation.   A time out was held and the surgical checklist run.   An infraumbilical incision was made into the abdominal cavity.  A 12 mm Hasson trocar was inserted into the abdominal cavity through the infraumbilical incision and secured with a 0 Vicryl suture.  Three additional trocars were inserted: a 10 mm trocar in the sub-xiphoid location, a 5 mm trocar in the right mid subcostal area, and a 5 mm trocar in the right lateral subcostal area.   The abdomen was explored and the liver, stomach, and bowel that could be seen were unremarkable.   The gall bladder was distended, but did not look  bad. She did not have acute cholecystitis and I do not think that the gall bladder explains her symptoms/sepsis.  I could easily see an enlarged CBD in the portal triad.  There was no stigmata of tumor.   I grasped the gall bladder and rotated it cephalad.  Disssection was carried down to the gall bladder/cystic duct junction and the cystic duct isolated.  A clip was placed on the gall bladder side of the cystic duct.   An intra-operative cholangiogram was shot.   The intra-operative cholangiogram was shot using a cut off Taut catheter placed through a 14 gauge angiocath in the RUQ.  The Taut catheter was inserted in the cut cystic duct and secured with an endoclip.  A cholangiogram was shot with 30 cc of 1/2 strength Isoview.  Using fluoroscopy, the cholangiogram showed the flow of contrast into the common bile duct, and up the hepatic radicals.  The contrast did not empty in to the duodenum.  The CBD was very large. There was no mass or obstruction.     The Taut catheter was removed.  The cystic duct was endolooped with PDS times two and the cystic artery was identified and clipped.  The gall bladder was bluntly and sharpley dissected from the gall bladder bed.   After the gall bladder was removed from the liver, the gall bladder bed and Triangle of [place] were inspected.  There was no bleeding or bile leak.  The gall bladder was placed in a endocatch bag and delivered through the umbilicus.  The abdomen was irrigated with 2,000 cc saline.   The trocars were then removed.  I infiltrated 30cc of 1/4% Marcaine into the incisions.  The umbilical port closed with a 0 Vicryl suture and the skin closed with 4-0 Monocryl.  The skin was painted with DermaBond.  The patient's sponge and needle count were correct.  The patient was transported to the RR in good condition.  Alphonsa Overall, MD, [facility] Surgery Pager: [phone] Office phone:  [phone]

## 2015-12-21 NOTE — Progress Notes (Signed)
PROGRESS NOTE    [name]  [id number] DOB: 1929-08-07 DOA: 12/18/2015 PCP: Irven Shelling, MD   Chief Complaint  Patient presents with  . Abdominal Pain    Brief Narrative:  82 yo female with hx of pancreatitis in the past, presents with abdominal pain, nausea and vomiting. Sepsis syndrome and biliary duct dilatation. Admitted for MRCP and further workup. Blood cultures positive for Kleb. Pneumo. [place] suggestive cholecystitis.  Assessment & Plan   Sepsis secondary to klebsiella pneumonia bacteremia/Acute cholecystitis -Appears to be improving -Blood culture from 10/15 +kleb pneumonia -repeat blood cultures from 10/17 pending -Continue IV zosyn  Abdominal pain/nausea vomiting secondary to acute cholecystitis -Abdominal US: positive for changes suggestive of cholecystitis -MRCP showed no obstruction -GI consulted and appreciated, recommended surgery consult -General surgery consulted and appreciated, laparoscopic cholecystectomy today -Continue pain control, antiemetics as needed  Elevated LFTs -Possibly secondary to the above -?stone passage -Continue supportive care  Diabetes mellitus, type II -Continue ISS and CBG monitoring   Chronic diastolic heart failure -Currently euvolemic -Monitor intake (on gentle IVF given), output, daily weights  CKD, stage III -Creatinine peaked at 1.25. Currently 1.04 -Continue to monitor BMP  Depression -Was on lithium and mirtazapine at home -Currently NPO  DVT Prophylaxis  SCDs  Code Status: Full  Family Communication: None at bedside  Disposition Plan: Admitted. Pending surgery today.   Consultants General surgery Gastroenterology   Procedures  Abdominal US MRCP  Antibiotics   Anti-infectives    Start     Dose/Rate Route Frequency Ordered Stop   12/20/15 2200  [MAR Hold]  piperacillin-tazobactam (ZOSYN) IVPB 3.375 g     (MAR Hold since 12/21/15 1157)   3.375 g 12.5 mL/hr over 240 Minutes Intravenous Every  8 hours 12/20/15 1623     12/20/15 1145  piperacillin-tazobactam (ZOSYN) IVPB 3.375 g  Status:  Discontinued     3.375 g 12.5 mL/hr over 240 Minutes Intravenous Every 8 hours 12/20/15 1133 12/20/15 1623   12/20/15 1100  cefTRIAXone (ROCEPHIN) 2 g in dextrose 5 % 50 mL IVPB  Status:  Discontinued     2 g 100 mL/hr over 30 Minutes Intravenous Every 24 hours 12/20/15 1028 12/20/15 1133   12/19/15 1800  vancomycin (VANCOCIN) 1,250 mg in sodium chloride 0.9 % 250 mL IVPB  Status:  Discontinued     1,250 mg 166.7 mL/hr over 90 Minutes Intravenous Every 24 hours 12/18/15 1910 12/19/15 1019   12/19/15 0200  piperacillin-tazobactam (ZOSYN) IVPB 3.375 g  Status:  Discontinued     3.375 g 12.5 mL/hr over 240 Minutes Intravenous Every 8 hours 12/18/15 1910 12/20/15 1028   12/18/15 1900  piperacillin-tazobactam (ZOSYN) IVPB 3.375 g     3.375 g 100 mL/hr over 30 Minutes Intravenous  Once 12/18/15 1852 12/18/15 2028   12/18/15 1900  vancomycin (VANCOCIN) IVPB 1000 mg/200 mL premix     1,000 mg 200 mL/hr over 60 Minutes Intravenous  Once 12/18/15 1852 12/18/15 2046      Subjective:   [name] seen and examined today.  Complains of abdominal pain, with mild nausea and vomiting. Denies chest pain, shortness of breath, headache, dizziness.  Objective:   Vitals:   12/20/15 1148 12/20/15 2036 12/21/15 0546 12/21/15 1006  BP: (!) 168/87 (!) 169/57 138/63   Pulse: 100 80 98   Resp: 20 20    Temp: 99.5 F (37.5 C) 98.7 F (37.1 C) 98.3 F (36.8 C)   TempSrc: Oral Oral Oral   SpO2: 100% 100%  100%   Weight:    70.4 kg (155 lb 3.2 oz)  Height:        Intake/Output Summary (Last 24 hours) at 12/21/15 1234 Last data filed at 12/21/15 0828  Gross per 24 hour  Intake          3276.25 ml  Output                1 ml  Net          3275.25 ml   Filed Weights   12/19/15 0550 12/20/15 0605 12/21/15 1006  Weight: 71.4 kg (157 lb 6.5 oz) 71.3 kg (157 lb 3 oz) 70.4 kg (155 lb 3.2 oz)     Exam  General: Well developed, well nourished, NAD, appears stated age  HEENT: NCAT, mucous membranes moist.   Cardiovascular: S1 S2 auscultated, no rubs, murmurs or gallops. Regular rate and rhythm.  Respiratory: Clear to auscultation bilaterally with equal chest rise  Abdomen: Soft, upper abdominal TTP, nondistended, + bowel sounds  Extremities: warm dry without cyanosis clubbing or edema  Neuro: AAOx3, nonfocal  Psych: Normal affect and demeanor with intact judgement and insight   Data Reviewed: I have personally reviewed following labs and imaging studies  CBC:  Recent Labs Lab 12/18/15 1435 12/19/15 0326 12/20/15 0247 12/21/15 0608  WBC 11.6* 11.6* 5.8 4.8  NEUTROABS  --  9.5* 4.2 3.0  HGB 13.7 11.4* 10.8* 12.1  HCT 41.6 34.0* 32.1* 36.8  MCV 84.9 84.4 84.3 84.2  PLT 244 188 133* [id number]   Basic Metabolic Panel:  Recent Labs Lab 12/18/15 1435 12/19/15 0326 12/20/15 0247 12/21/15 0608  NA 138 137 139 144  K 3.7 3.5 3.5 3.7  CL 103 101 106 110  CO2 22 25 24 25   GLUCOSE 259* 308* 171* 184*  BUN 9 10 6 6   CREATININE 1.14* 1.25* 1.13* 1.04*  CALCIUM 9.4 8.6* 8.4* 9.2   GFR: Estimated Creatinine Clearance: 38.8 mL/min (by C-G formula based on SCr of 1.04 mg/dL (H)). Liver Function Tests:  Recent Labs Lab 12/18/15 1758 12/19/15 0326 12/20/15 0247 12/21/15 0608  AST 530* 980* 371* 124*  ALT 184* 473* 344* 242*  ALKPHOS 173* 166* 155* 166*  BILITOT 2.9* 3.8* 3.6* 1.9*  PROT 6.8 5.4* 5.4* 5.8*  ALBUMIN 3.7 2.9* 2.6* 2.7*    Recent Labs Lab 12/18/15 1758  LIPASE 26   No results for input(s): AMMONIA in the last 168 hours. Coagulation Profile:  Recent Labs Lab 12/18/15 1758  INR 1.14   Cardiac Enzymes: No results for input(s): CKTOTAL, CKMB, CKMBINDEX, TROPONINI in the last 168 hours. BNP (last 3 results) No results for input(s): PROBNP in the last 8760 hours. HbA1C: No results for input(s): HGBA1C in the last 72  hours. CBG:  Recent Labs Lab 12/20/15 1147 12/20/15 1747 12/20/15 2051 12/21/15 0022 12/21/15 0616  GLUCAP 156* 136* 158* 105* 161*   Lipid Profile: No results for input(s): CHOL, HDL, LDLCALC, TRIG, CHOLHDL, LDLDIRECT in the last 72 hours. Thyroid Function Tests: No results for input(s): TSH, T4TOTAL, FREET4, T3FREE, THYROIDAB in the last 72 hours. Anemia Panel: No results for input(s): VITAMINB12, FOLATE, FERRITIN, TIBC, IRON, RETICCTPCT in the last 72 hours. Urine analysis:    Component Value Date/Time   COLORURINE YELLOW 12/18/2015 1830   APPEARANCEUR CLOUDY (A) 12/18/2015 1830   LABSPEC 1.010 12/18/2015 1830   PHURINE 6.0 12/18/2015 1830   GLUCOSEU 500 (A) 12/18/2015 1830   HGBUR NEGATIVE 12/18/2015 1830   [place] NEGATIVE 12/18/2015  [place] 12/18/2015 [place] 12/18/2015 1830   UROBILINOGEN 0.2 11/20/2014 1405   NITRITE NEGATIVE 12/18/2015 1830   LEUKOCYTESUR NEGATIVE 12/18/2015 1830   Sepsis Labs: @LABRCNTIP (procalcitonin:4,lacticidven:4)  ) Recent Results (from the past 240 hour(s))  Urine culture     Status: Abnormal   Collection Time: 12/18/15  6:30 PM  Result Value Ref Range Status   Specimen Description URINE, RANDOM  Final   Special Requests NONE  Final   Culture >=100,000 COLONIES/mL KLEBSIELLA PNEUMONIAE (A)  Final   Report Status 12/20/2015 FINAL  Final   Organism ID, Bacteria KLEBSIELLA PNEUMONIAE (A)  Final      Susceptibility   Klebsiella pneumoniae - MIC*    AMPICILLIN >=32 RESISTANT Resistant     CEFAZOLIN <=4 SENSITIVE Sensitive     CEFTRIAXONE <=1 SENSITIVE Sensitive     CIPROFLOXACIN <=0.25 SENSITIVE Sensitive     GENTAMICIN <=1 SENSITIVE Sensitive     IMIPENEM 0.5 SENSITIVE Sensitive     NITROFURANTOIN 64 INTERMEDIATE Intermediate     TRIMETH/SULFA <=20 SENSITIVE Sensitive     AMPICILLIN/SULBACTAM 4 SENSITIVE Sensitive     PIP/TAZO <=4 SENSITIVE Sensitive     Extended ESBL NEGATIVE Sensitive      * >=100,000 COLONIES/mL KLEBSIELLA PNEUMONIAE  Blood Culture (routine x 2)     Status: None (Preliminary result)   Collection Time: 12/18/15  7:00 PM  Result Value Ref Range Status   Specimen Description BLOOD RIGHT ANTECUBITAL  Final   Special Requests BOTTLES DRAWN AEROBIC AND ANAEROBIC 5CC  Final   Culture NO GROWTH 2 DAYS  Final   Report Status PENDING  Incomplete  Blood Culture (routine x 2)     Status: Abnormal   Collection Time: 12/18/15  7:07 PM  Result Value Ref Range Status   Specimen Description BLOOD LEFT HAND  Final   Special Requests BOTTLES DRAWN AEROBIC AND ANAEROBIC 5CC  Final   Culture  Setup Time   Final    GRAM NEGATIVE RODS ANAEROBIC BOTTLE ONLY CRITICAL RESULT CALLED TO, READ BACK BY AND VERIFIED WITH: M MACCIA,PHARMD AT 1011 12/19/15 BY L BENFIELD    Culture KLEBSIELLA PNEUMONIAE (A)  Final   Report Status 12/21/2015 FINAL  Final   Organism ID, Bacteria KLEBSIELLA PNEUMONIAE  Final      Susceptibility   Klebsiella pneumoniae - MIC*    AMPICILLIN 16 RESISTANT Resistant     CEFAZOLIN <=4 SENSITIVE Sensitive     CEFEPIME <=1 SENSITIVE Sensitive     CEFTAZIDIME <=1 SENSITIVE Sensitive     CEFTRIAXONE <=1 SENSITIVE Sensitive     CIPROFLOXACIN <=0.25 SENSITIVE Sensitive     GENTAMICIN <=1 SENSITIVE Sensitive     IMIPENEM <=0.25 SENSITIVE Sensitive     TRIMETH/SULFA <=20 SENSITIVE Sensitive     AMPICILLIN/SULBACTAM 4 SENSITIVE Sensitive     PIP/TAZO <=4 SENSITIVE Sensitive     Extended ESBL NEGATIVE Sensitive     * KLEBSIELLA PNEUMONIAE  Blood Culture ID Panel (Reflexed)     Status: Abnormal   Collection Time: 12/18/15  7:07 PM  Result Value Ref Range Status   Enterococcus species NOT DETECTED NOT DETECTED Final   Listeria monocytogenes NOT DETECTED NOT DETECTED Final   Staphylococcus species NOT DETECTED NOT DETECTED Final   Staphylococcus aureus NOT DETECTED NOT DETECTED Final   Streptococcus species NOT DETECTED NOT DETECTED Final   Streptococcus  agalactiae NOT DETECTED NOT DETECTED Final   Streptococcus pneumoniae NOT DETECTED NOT DETECTED Final  Streptococcus pyogenes NOT DETECTED NOT DETECTED Final   Acinetobacter baumannii NOT DETECTED NOT DETECTED Final   Enterobacteriaceae species DETECTED (A) NOT DETECTED Final    Comment: CRITICAL RESULT CALLED TO, READ BACK BY AND VERIFIED WITH: M MACCIA,PHARMD AT 1011 12/19/15 BY L BENFIELD    Enterobacter cloacae complex NOT DETECTED NOT DETECTED Final   Escherichia coli NOT DETECTED NOT DETECTED Final   Klebsiella oxytoca NOT DETECTED NOT DETECTED Final   Klebsiella pneumoniae DETECTED (A) NOT DETECTED Final    Comment: CRITICAL RESULT CALLED TO, READ BACK BY AND VERIFIED WITH: M MACCIA,PHARMD AT 1011 12/19/15 BY L BENFIELD    Proteus species NOT DETECTED NOT DETECTED Final   Serratia marcescens NOT DETECTED NOT DETECTED Final   Carbapenem resistance NOT DETECTED NOT DETECTED Final   Haemophilus influenzae NOT DETECTED NOT DETECTED Final   Neisseria meningitidis NOT DETECTED NOT DETECTED Final   Pseudomonas aeruginosa NOT DETECTED NOT DETECTED Final   Candida albicans NOT DETECTED NOT DETECTED Final   Candida glabrata NOT DETECTED NOT DETECTED Final   Candida krusei NOT DETECTED NOT DETECTED Final   Candida parapsilosis NOT DETECTED NOT DETECTED Final   Candida tropicalis NOT DETECTED NOT DETECTED Final  Surgical pcr screen     Status: None   Collection Time: 12/21/15  5:21 AM  Result Value Ref Range Status   MRSA, PCR NEGATIVE NEGATIVE Final   Staphylococcus aureus NEGATIVE NEGATIVE Final    Comment:        The Xpert SA Assay (FDA approved for NASAL specimens in patients over 65 years of age), is one component of a comprehensive surveillance program.  Test performance has been validated by [facility] for patients greater than or equal to 21 year old. It is not intended to diagnose infection nor to guide or monitor treatment.       Radiology Studies: US Abdomen  Limited  Result Date: 12/20/2015 CLINICAL DATA:  Abnormal LFTs. EXAM: US ABDOMEN LIMITED - RIGHT UPPER QUADRANT COMPARISON:  MRI 12/19/2015 . FINDINGS: Gallbladder: Mild amount of gallbladder sludge cannot be excluded. Severe thickening of the gallbladder wall to 7 mm noted. Small amount of pericholecystic fluid noted. Positive ultrasound Murphy's sign noted. Findings suggest cholecystitis. Common bile duct: Diameter: 17 mm. Liver: No focal hepatic abnormality identified. Echotexture normal. Intrahepatic biliary ductal dilatation noted. Similar finding noted on prior MRI of 12/19/2015. IMPRESSION: 1. Mild of mouth of gallbladder sludge cannot be excluded. Severe thickening of the gallbladder wall up to 7 mm noted. Small amount of pericholecystic fluid noted. Positive ultrasound Murphy sign noted. Findings suggest cholecystitis. 2. Distention of the common bile duct and intrahepatic biliary ducts. Similar finding noted on prior MRI of 12/19/2015. Electronically Signed   By: Marcello Moores  Register   On: 12/20/2015 10:25     Scheduled Meds: . [MAR Hold] famotidine (PEPCID) IV  20 mg Intravenous Q12H  . [MAR Hold] fentaNYL (SUBLIMAZE) injection  50 mcg Intravenous Once  . [MAR Hold] Influenza vac split quadrivalent PF  0.5 mL Intramuscular Tomorrow-1000  . [MAR Hold] insulin aspart  0-9 Units Subcutaneous TID WC  . [MAR Hold] lithium carbonate  150 mg Oral Daily  . [MAR Hold] mirtazapine  30 mg Oral QHS  . [MAR Hold] oxybutynin  5 mg Oral BID  . [MAR Hold] piperacillin-tazobactam (ZOSYN)  IV  3.375 g Intravenous Q8H  . [MAR Hold] pneumococcal 23 valent vaccine  0.5 mL Intramuscular Tomorrow-1000  . [MAR Hold] pregabalin  75 mg Oral BID  . [facility]  Hold] sodium chloride flush  3 mL Intravenous Q12H  . [MAR Hold] Vilazodone HCl  20 mg Oral Daily   Continuous Infusions: . sodium chloride 75 mL/hr at 12/21/15 0653  . lactated ringers       LOS: 3 days   Time Spent in minutes   30 minutes  Zekiah Caruth,  Tyna Huertas D.O. on 12/21/2015 at 12:34 PM  Between 7am to 7pm - Pager - [phone]  After 7pm go to [email or web address] - password [email or web address]  And look for the night coverage person covering for me after hours  [facility] Office  [phone]

## 2015-12-21 NOTE — Progress Notes (Signed)
Patient ID: [name], female   DOB: 1930/01/19, 82 y.o.   MRN: [id number]  Called by Dr. Lucia Gaskins to discuss operative findings. Discussed case with Dr. Paulita Fujita and we will continue with conservative management and follow LFTs. If LFTs continue to improve, then will have patient f/u with Dr. Paulita Fujita in 3-4 weeks and if she continues to have episodes concerning for biliary tract disease then Dr. Paulita Fujita will discuss feasibility of a sphincterotomy at that time. Will follow up tomorrow.

## 2015-12-21 NOTE — Anesthesia Preprocedure Evaluation (Signed)
Anesthesia Evaluation  Patient identified by MRN, date of birth, ID band Patient awake    Reviewed: Allergy & Precautions, NPO status , Patient's Chart, lab work & pertinent test results  Airway Mallampati: I  TM Distance: >3 FB Neck ROM: Full    Dental   Pulmonary    Pulmonary exam normal        Cardiovascular hypertension, Pt. on medications Normal cardiovascular exam     Neuro/Psych    GI/Hepatic GERD  Medicated and Controlled,  Endo/Other  diabetes, Type 2, Oral Hypoglycemic Agents  Renal/GU      Musculoskeletal   Abdominal   Peds  Hematology   Anesthesia Other Findings   Reproductive/Obstetrics                             Anesthesia Physical Anesthesia Plan  ASA: III  Anesthesia Plan: General   Post-op Pain Management:    Induction: Intravenous  Airway Management Planned: Oral ETT  Additional Equipment:   Intra-op Plan:   Post-operative Plan: Extubation in OR  Informed Consent: I have reviewed the patients History and Physical, chart, labs and discussed the procedure including the risks, benefits and alternatives for the proposed anesthesia with the patient or authorized representative who has indicated his/her understanding and acceptance.     Plan Discussed with: CRNA and Surgeon  Anesthesia Plan Comments:         Anesthesia Quick Evaluation

## 2015-12-21 NOTE — Anesthesia Procedure Notes (Signed)
Procedure Name: Intubation Date/Time: 12/21/2015 12:50 PM Performed by: Merrilyn Puma B Pre-anesthesia Checklist: Patient identified, Emergency Drugs available, Suction available, Patient being monitored and Timeout performed Patient Re-evaluated:Patient Re-evaluated prior to inductionOxygen Delivery Method: Circle system utilized Preoxygenation: Pre-oxygenation with 100% oxygen Intubation Type: IV induction Ventilation: Mask ventilation without difficulty Laryngoscope Size: Mac and 3 Grade View: Grade I Tube type: Oral Tube size: 7.0 mm Number of attempts: 1 Airway Equipment and Method: Stylet Placement Confirmation: ETT inserted through vocal cords under direct vision,  positive ETCO2,  CO2 detector and breath sounds checked- equal and bilateral Secured at: 21 cm Tube secured with: Tape Dental Injury: Teeth and Oropharynx as per pre-operative assessment

## 2015-12-21 NOTE — Progress Notes (Signed)
                                                             PT Cancellation Note  Pt in OR, will follow up tomorrow.   Leighton Roach, [place]  [phone]

## 2015-12-22 ENCOUNTER — Encounter (HOSPITAL_COMMUNITY): Payer: Self-pay | Admitting: Surgery

## 2015-12-22 DIAGNOSIS — R7989 Other specified abnormal findings of blood chemistry: Secondary | ICD-10-CM | POA: Diagnosis not present

## 2015-12-22 DIAGNOSIS — K819 Cholecystitis, unspecified: Secondary | ICD-10-CM

## 2015-12-22 DIAGNOSIS — R109 Unspecified abdominal pain: Secondary | ICD-10-CM | POA: Diagnosis not present

## 2015-12-22 DIAGNOSIS — R112 Nausea with vomiting, unspecified: Secondary | ICD-10-CM | POA: Diagnosis not present

## 2015-12-22 DIAGNOSIS — I5032 Chronic diastolic (congestive) heart failure: Secondary | ICD-10-CM | POA: Diagnosis not present

## 2015-12-22 DIAGNOSIS — K8010 Calculus of gallbladder with chronic cholecystitis without obstruction: Secondary | ICD-10-CM | POA: Diagnosis not present

## 2015-12-22 DIAGNOSIS — A419 Sepsis, unspecified organism: Secondary | ICD-10-CM | POA: Diagnosis not present

## 2015-12-22 DIAGNOSIS — R740 Nonspecific elevation of levels of transaminase and lactic acid dehydrogenase [LDH]: Secondary | ICD-10-CM | POA: Diagnosis not present

## 2015-12-22 LAB — CBC WITH DIFFERENTIAL/PLATELET
BASOS ABS: 0 10*3/uL (ref 0.0–0.1)
BASOS PCT: 0 %
Eosinophils Absolute: 0 10*3/uL (ref 0.0–0.7)
Eosinophils Relative: 0 %
HEMATOCRIT: 33.5 % — AB (ref 36.0–46.0)
HEMOGLOBIN: 11 g/dL — AB (ref 12.0–15.0)
LYMPHS PCT: 18 %
Lymphs Abs: 1.4 10*3/uL (ref 0.7–4.0)
MCH: 27.9 pg (ref 26.0–34.0)
MCHC: 32.8 g/dL (ref 30.0–36.0)
MCV: 85 fL (ref 78.0–100.0)
Monocytes Absolute: 0.8 10*3/uL (ref 0.1–1.0)
Monocytes Relative: 11 %
NEUTROS ABS: 5.5 10*3/uL (ref 1.7–7.7)
NEUTROS PCT: 71 %
Platelets: 181 10*3/uL (ref 150–400)
RBC: 3.94 MIL/uL (ref 3.87–5.11)
RDW: 14.4 % (ref 11.5–15.5)
WBC: 7.7 10*3/uL (ref 4.0–10.5)

## 2015-12-22 LAB — COMPREHENSIVE METABOLIC PANEL
ALBUMIN: 2.8 g/dL — AB (ref 3.5–5.0)
ALK PHOS: 138 U/L — AB (ref 38–126)
ALT: 177 U/L — ABNORMAL HIGH (ref 14–54)
ANION GAP: 8 (ref 5–15)
AST: 67 U/L — ABNORMAL HIGH (ref 15–41)
BUN: 8 mg/dL (ref 6–20)
CALCIUM: 9.1 mg/dL (ref 8.9–10.3)
CHLORIDE: 108 mmol/L (ref 101–111)
CO2: 26 mmol/L (ref 22–32)
Creatinine, Ser: 1.04 mg/dL — ABNORMAL HIGH (ref 0.44–1.00)
GFR calc non Af Amer: 48 mL/min — ABNORMAL LOW (ref >60)
GFR, EST AFRICAN AMERICAN: 56 mL/min — AB (ref >60)
GLUCOSE: 270 mg/dL — AB (ref 65–99)
Potassium: 4.3 mmol/L (ref 3.5–5.1)
SODIUM: 142 mmol/L (ref 135–145)
Total Bilirubin: 1.6 mg/dL — ABNORMAL HIGH (ref 0.3–1.2)
Total Protein: 5.9 g/dL — ABNORMAL LOW (ref 6.5–8.1)

## 2015-12-22 LAB — GLUCOSE, CAPILLARY
GLUCOSE-CAPILLARY: 271 mg/dL — AB (ref 65–99)
GLUCOSE-CAPILLARY: 222 mg/dL — AB (ref 65–99)
GLUCOSE-CAPILLARY: 205 mg/dL — AB (ref 65–99)
Glucose-Capillary: 227 mg/dL — ABNORMAL HIGH (ref 65–99)

## 2015-12-22 MED ORDER — INSULIN ASPART 100 UNIT/ML ~~LOC~~ SOLN
5.0000 [IU] | Freq: Once | SUBCUTANEOUS | Status: AC
  Administered 2015-12-22: 5 [IU] via SUBCUTANEOUS

## 2015-12-22 MED ORDER — TRAMADOL HCL 50 MG PO TABS
50.0000 mg | ORAL_TABLET | Freq: Four times a day (QID) | ORAL | Status: DC | PRN
  Administered 2015-12-22: 100 mg via ORAL
  Filled 2015-12-22: qty 2

## 2015-12-22 MED ORDER — MORPHINE SULFATE (PF) 2 MG/ML IV SOLN: 2.0000 mg | INTRAVENOUS | Status: DC | PRN

## 2015-12-22 NOTE — Progress Notes (Signed)
[facility] Gastroenterology Progress Note  [name] 82 y.o. 1929/11/08   Subjective: Sleeping. Easily arousable. Feels ok.  Objective: Vital signs in last 24 hours: Vitals:   12/22/15 0951 12/22/15 1120  BP:  (!) 184/87  Pulse: (!) 135 93  Resp:  18  Temp:  98.8 F (37.1 C)    Physical Exam: Gen: elderly, frail, no acute distress HEENT: anicteric sclera CV: RRR Chest: CTA anteriorly Abd: diffuse tenderness with guarding, soft, nondistended, +BS  Lab Results:  Recent Labs  12/21/15 0608 12/22/15 0358  NA 144 142  K 3.7 4.3  CL 110 108  CO2 25 26  GLUCOSE 184* 270*  BUN 6 8  CREATININE 1.04* 1.04*  CALCIUM 9.2 9.1    Recent Labs  12/21/15 0608 12/22/15 0358  AST 124* 67*  ALT 242* 177*  ALKPHOS 166* 138*  BILITOT 1.9* 1.6*  PROT 5.8* 5.9*  ALBUMIN 2.7* 2.8*    Recent Labs  12/21/15 0608 12/22/15 0358  WBC 4.8 7.7  NEUTROABS 3.0 5.5  HGB 12.1 11.0*  HCT 36.8 33.5*  MCV 84.2 85.0  PLT 174 181   No results for input(s): LABPROT, INR in the last 72 hours.    Assessment/Plan: Klebsiella bacteremia with acute cholecystitis and chronically dilated CBD. LFTs improving. Hold off on ERCP unless LFTs rise again and then may need to reconsider doing an ERCP. Patient needs to f/u with Dr. Paulita Fujita in 3-4 weeks. Will sign off. Call if questions.   [place] C. 12/22/2015, 4:33 PM  Pager [phone]  If no answer or after 5 PM call [phone] ID: [name], female   DOB: 12/20/1929, 82 y.o.   MRN: [id number]

## 2015-12-22 NOTE — Discharge Instructions (Signed)
[facility] ______[facility] °LAPAROSCOPIC SURGERY: POST OP INSTRUCTIONS °Always review your discharge instruction sheet given to you by the facility where your surgery was performed. °IF YOU HAVE DISABILITY OR FAMILY LEAVE FORMS, YOU MUST BRING THEM TO THE OFFICE FOR PROCESSING.   °DO NOT GIVE THEM TO YOUR DOCTOR. ° °1. A prescription for pain medication may be given to you upon discharge.  Take your pain medication as prescribed, if needed.  If narcotic pain medicine is not needed, then you may take acetaminophen (Tylenol) or ibuprofen (Advil) as needed. °2. Take your usually prescribed medications unless otherwise directed. °3. If you need a refill on your pain medication, please contact your pharmacy.  They will contact our office to request authorization. Prescriptions will not be filled after 5pm or on week-ends. °4. You should follow a light diet the first few days after arrival home, such as soup and crackers, etc.  Be sure to include lots of fluids daily. °5. Most patients will experience some swelling and bruising in the area of the incisions.  Ice packs will help.  Swelling and bruising can take several days to resolve.  °6. It is common to experience some constipation if taking pain medication after surgery.  Increasing fluid intake and taking a stool softener (such as Colace) will usually help or prevent this problem from occurring.  A mild laxative (Milk of Magnesia or Miralax) should be taken according to package instructions if there are no bowel movements after 48 hours. °7. Unless discharge instructions indicate otherwise, you may remove your bandages 24-48 hours after surgery, and you may shower at that time.  You may have steri-strips (small skin tapes) in place directly over the incision.  These strips should be left on the skin for 7-10 days.  If your surgeon used skin glue on the incision, you may shower in 24 hours.  The glue will flake off over the next 2-3 weeks.  Any sutures or  staples will be removed at the office during your follow-up visit. °8. ACTIVITIES:  You may resume regular (light) daily activities beginning the next day--such as daily self-care, walking, climbing stairs--gradually increasing activities as tolerated.  You may have sexual intercourse when it is comfortable.  Refrain from any heavy lifting or straining until approved by your doctor. °a. You may drive when you are no longer taking prescription pain medication, you can comfortably wear a seatbelt, and you can safely maneuver your car and apply brakes. °b. RETURN TO WORK:  __________________________________________________________ °9. You should see your doctor in the office for a follow-up appointment approximately 2-3 weeks after your surgery.  Make sure that you call for this appointment within a day or two after you arrive home to insure a convenient appointment time. °10. OTHER INSTRUCTIONS: __________________________________________________________________________________________________________________________ __________________________________________________________________________________________________________________________ °WHEN TO CALL YOUR DOCTOR: °1. Fever over 101.0 °2. Inability to urinate °3. Continued bleeding from incision. °4. Increased pain, redness, or drainage from the incision. °5. Increasing abdominal pain ° °The clinic staff is available to answer your questions during regular business hours.  Please don’t hesitate to call and ask to speak to one of the nurses for clinical concerns.  If you have a medical emergency, go to the nearest emergency room or call 911.  A surgeon from [facility] is always on call at the hospital. °[address], [address], [place], [place]  [address] ? [address] [address], [place], [place]   [address] °[phone] ? [phone] ? FAX [phone] °Web site:   [email or web address] °

## 2015-12-22 NOTE — Care Management Important Message (Signed)
Important Message  Patient Details  Name: [name] MRN: [id number] Date of Birth: [birth year]   Medicare Important Message Given:  Yes    [name] 12/22/2015, 11:00 AM

## 2015-12-22 NOTE — Evaluation (Signed)
Physical Therapy Evaluation Patient Details Name: [name] MRN: [id number] DOB: 04-17-1929 Today's Date: 12/22/2015   History of Present Illness  82 yo female with hx of pancreatitis in the past, DM2 HTN, CKD, presents with abdominal pain, nausea and vomiting. Sepsis syndrome and biliary duct dilatation. Admitted for MRCP and further workup. Blood cultures positive for Kleb. Pneumo. [place] suggestive cholecystitis. s/p lap cholecystectomy 12/21/15.  Clinical Impression  Pt admitted with above diagnosis. Pt currently with functional limitations due to the deficits listed below (see PT Problem List). Pt ambulated 21' with RW and min A to negotiate obstacles. Pt having some confusion, stated there's a baby in her room and not oriented to month/year, unclear if this is baseline as no family present.  Pt will benefit from skilled PT to increase their independence and safety with mobility to allow discharge to the venue listed below.       Follow Up Recommendations Home health PT    Equipment Recommendations  None recommended by PT    Recommendations for Other Services       Precautions / Restrictions Precautions Precautions: Fall Precaution Comments: confusion Restrictions Weight Bearing Restrictions: No      Mobility  Bed Mobility               General bed mobility comments: up in recliner  Transfers Overall transfer level: Needs assistance Equipment used: Rolling walker (2 wheeled) Transfers: Sit to/from Stand Sit to Stand: Min assist         General transfer comment: min A to rise/steady, VCs hand placement  Ambulation/Gait Ambulation/Gait assistance: Min assist Ambulation Distance (Feet): 80 Feet Assistive device: Rolling walker (2 wheeled) Gait Pattern/deviations: Step-through pattern;Decreased step length - left;Decreased step length - right   Gait velocity interpretation: at or above normal speed for age/gender General Gait Details: min A to negotiate  obstacles with RW, VCs to increase step length and to step into frame of RW, HR 135 with walking, 2/4 dyspnea walking, SaO2 98% on RA, no LOB  Stairs            Wheelchair Mobility    Modified Rankin (Stroke Patients Only)       Balance Overall balance assessment: Needs assistance   Sitting balance-Leahy Scale: Good       Standing balance-Leahy Scale: Fair                               Pertinent Vitals/Pain Pain Assessment: Faces Faces Pain Scale: Hurts little more Pain Location: R abdomen Pain Descriptors / Indicators: Sore Pain Intervention(s): Limited activity within patient's tolerance;Monitored during session;Patient requesting pain meds-RN notified    Home Living Family/patient expects to be discharged to:: Private residence Living Arrangements: Spouse/significant other Available Help at Discharge: Family Type of Home: House Home Access: Stairs to enter     Home Layout: One level Home Equipment: Environmental consultant - 4 wheels;Cane - single point;Shower seat      Prior Function Level of Independence: Needs assistance   Gait / Transfers Assistance Needed: walks with rollator  ADL's / Homemaking Assistance Needed: assistance needed for bathing and dressing, pt report aide comes 2x/week        Hand Dominance   Dominant Hand: Right    Extremity/Trunk Assessment   Upper Extremity Assessment: Generalized weakness           Lower Extremity Assessment: Generalized weakness      Cervical / Trunk Assessment: Kyphotic (  forward head)  Communication   Communication: No difficulties  Cognition Arousal/Alertness: Awake/alert Behavior During Therapy: WFL for tasks assessed/performed Overall Cognitive Status: No family/caregiver present to determine baseline cognitive functioning (pt able to answer some questions about home setup, not oriented to month/year, oriented to self and location, stated multiple times that there is a baby in her room (there is  not))                      General Comments General comments (skin integrity, edema, etc.): R lateral heel has pressure sore, RN aware, pt stated this is chronic    Exercises     Assessment/Plan    PT Assessment Patient needs continued PT services  PT Problem List Decreased strength;Decreased activity tolerance;Decreased balance;Decreased mobility;Cardiopulmonary status limiting activity;Pain;Decreased cognition          PT Treatment Interventions DME instruction;Gait training;Stair training;Therapeutic activities;Therapeutic exercise;Balance training;Patient/family education    PT Goals (Current goals can be found in the Care Plan section)  Acute Rehab PT Goals Patient Stated Goal: none stated PT Goal Formulation: With patient Time For Goal Achievement: 01/05/16 Potential to Achieve Goals: Good    Frequency Min 3X/week   Barriers to discharge        Co-evaluation               End of Session Equipment Utilized During Treatment: Gait belt Activity Tolerance: Patient limited by fatigue;Patient limited by pain Patient left: in chair;with call bell/phone within reach;with chair alarm set;with nursing/sitter in room Nurse Communication: Mobility status;Patient requests pain meds;Other (comment) (pt confused, pt soiled, sore R lateral heel)         Time: [id number] PT Time Calculation (min) (ACUTE ONLY): 23 min   Charges:   PT Evaluation $PT Eval Low Complexity: 1 Procedure PT Treatments $Gait Training: 8-22 mins   PT G Codes:        Philomena Doheny 12/22/2015, 9:59 AM [phone]

## 2015-12-22 NOTE — Progress Notes (Addendum)
Patient ID: [name], female   DOB: 01/30/1930, 82 y.o.   MRN: [id number]   LOS: 4 days   POD#1  Subjective: C/o RUQ pain, different than pre-op, pain meds not effective enough   Objective: Vital signs in last 24 hours: Temp:  [97.6 F (36.4 C)-98.9 F (37.2 C)] 98.9 F (37.2 C) (10/19 0533) Pulse Rate:  [82-135] 135 (10/19 0951) Resp:  [16-29] 18 (10/19 0533) BP: (97-204)/(54-122) 160/100 (10/19 0636) SpO2:  [81 %-100 %] 100 % (10/19 0951) Weight:  [68.5 kg (151 lb)] 68.5 kg (151 lb) (10/19 0533) Last BM Date: 12/21/15   Laboratory  CBC  Recent Labs  12/21/15 0608 12/22/15 0358  WBC 4.8 7.7  HGB 12.1 11.0*  HCT 36.8 33.5*  PLT 174 181   BMET  Recent Labs  12/21/15 0608 12/22/15 0358  NA 144 142  K 3.7 4.3  CL 110 108  CO2 25 26  GLUCOSE 184* 270*  BUN 6 8  CREATININE 1.04* 1.04*  CALCIUM 9.2 9.1   Hepatic Function Latest Ref Rng & Units 12/22/2015 12/21/2015 12/20/2015  Total Protein 6.5 - 8.1 g/dL 5.9(L) 5.8(L) 5.4(L)  Albumin 3.5 - 5.0 g/dL 2.8(L) 2.7(L) 2.6(L)  AST 15 - 41 U/L 67(H) 124(H) 371(H)  ALT 14 - 54 U/L 177(H) 242(H) 344(H)  Alk Phosphatase 38 - 126 U/L 138(H) 166(H) 155(H)  Total Bilirubin 0.3 - 1.2 mg/dL 1.6(H) 1.9(H) 3.6(H)  Bilirubin, Direct 0.1 - 0.5 mg/dL - - -    Physical Exam General appearance: alert and no distress GI: Port sites WNL, +BS, mod TTP   Assessment/Plan: Normal appearing gall bladder, s/p lap chole w/IOC POD#1 - 12/20/2015 - D. Motty Borin   -- LFT's continue to trend downward.   Will put on tramadol prn for pain initially, may need short-term narcotic to go home.   Ok to discharge home from surgical standpoint. We will follow while she's in house.  Chronically dilated CBD  Lisette Abu, PA-C Pager: [phone] 12/22/2015   Agree with above.  There are notes that say acute cholecystitis, but if the gall bladder had disease, it was mild, and I do not think was the source of Klebsiella sepsis. I will see  [name] in our office in 2 to 4 weeks for post op follow up.  Alphonsa Overall, MD, [facility] Pager: [phone] Office phone:  [phone]

## 2015-12-22 NOTE — Progress Notes (Addendum)
Inpatient Diabetes Program Recommendations  AACE/ADA: New Consensus Statement on Inpatient Glycemic Control (2015)  Target Ranges:  Prepandial:   less than 140 mg/dL      Peak postprandial:   less than 180 mg/dL (1-2 hours)      Critically ill patients:  140 - 180 mg/dL    Results for [name], [name] (MRN [id number]) as of 12/22/2015 11:15  Ref. Range 12/21/2015 06:16 12/21/2015 14:37 12/21/2015 17:15 12/21/2015 23:53 12/22/2015 06:18  Glucose-Capillary Latest Ref Range: 65 - 99 mg/dL 161 (H) 149 (H) 183 (H) 354 (H) 227 (H)   Review of Glycemic Control  Diabetes history: DM2 Outpatient Diabetes medications: Amaryl 2 mg daily  Current orders for Inpatient glycemic control: Novolog 0-9 units [facility]  Inpatient Diabetes Program Recommendations:  Please consider increasing correction scale to Novolog 0-15 units TIDAC and 0-5 units QHS.  Thank you,  Windy Carina, RN, BSN Diabetes Coordinator Inpatient Diabetes Program [phone] (Team Pager)

## 2015-12-22 NOTE — Progress Notes (Signed)
PROGRESS NOTE    [name]  [id number] DOB: 1930-01-18 DOA: 12/18/2015 PCP: Irven Shelling, MD   Chief Complaint  Patient presents with  . Abdominal Pain    Brief Narrative:  82 yo female with hx of pancreatitis in the past, presents with abdominal pain, nausea and vomiting. Sepsis syndrome and biliary duct dilatation. Admitted for MRCP and further workup. Blood cultures positive for Kleb. Pneumo. [place] suggestive cholecystitis.  Assessment & Plan   Sepsis secondary to klebsiella pneumonia bacteremia -Appears to be improving -Blood culture from 10/15 +kleb pneumonia -repeat blood cultures from 10/17 pending -Continue IV zosyn  Abdominal pain/nausea vomiting secondary to unknown source -Abdominal US: positive for changes suggestive of cholecystitis -MRCP showed no obstruction -GI consulted and appreciated, recommended surgery consult -General surgery consulted and appreciated, laparoscopic cholecystectomy 10/18 -Spoke with Dr. Lucia Gaskins on 10/18, did not feel that gallbladder was the culprit as it had a normal appearance -Continue pain control, antiemetics as needed  Elevated LFTs -Possibly secondary to the above -?stone passage -Continue supportive care  Diabetes mellitus, type II -Continue ISS and CBG monitoring   Chronic diastolic heart failure -Currently euvolemic -Monitor intake (on gentle IVF given), output, daily weights  CKD, stage III -Creatinine peaked at 1.25. Currently 1.04 -Continue to monitor BMP  Depression -Continue lithium and mirtazapine  DVT Prophylaxis  SCDs  Code Status: Full  Family Communication: None at bedside  Disposition Plan: Admitted. Continue to monitor LFTs.  Suspect patient can be discharged home in 24 hours if pain is improved and LFTs continue to trend down.  Consultants General surgery Gastroenterology   Procedures  Abdominal US MRCP  Antibiotics   Anti-infectives    Start     Dose/Rate Route Frequency Ordered  Stop   12/20/15 2200  piperacillin-tazobactam (ZOSYN) IVPB 3.375 g     3.375 g 12.5 mL/hr over 240 Minutes Intravenous Every 8 hours 12/20/15 1623     12/20/15 1145  piperacillin-tazobactam (ZOSYN) IVPB 3.375 g  Status:  Discontinued     3.375 g 12.5 mL/hr over 240 Minutes Intravenous Every 8 hours 12/20/15 1133 12/20/15 1623   12/20/15 1100  cefTRIAXone (ROCEPHIN) 2 g in dextrose 5 % 50 mL IVPB  Status:  Discontinued     2 g 100 mL/hr over 30 Minutes Intravenous Every 24 hours 12/20/15 1028 12/20/15 1133   12/19/15 1800  vancomycin (VANCOCIN) 1,250 mg in sodium chloride 0.9 % 250 mL IVPB  Status:  Discontinued     1,250 mg 166.7 mL/hr over 90 Minutes Intravenous Every 24 hours 12/18/15 1910 12/19/15 1019   12/19/15 0200  piperacillin-tazobactam (ZOSYN) IVPB 3.375 g  Status:  Discontinued     3.375 g 12.5 mL/hr over 240 Minutes Intravenous Every 8 hours 12/18/15 1910 12/20/15 1028   12/18/15 1900  piperacillin-tazobactam (ZOSYN) IVPB 3.375 g     3.375 g 100 mL/hr over 30 Minutes Intravenous  Once 12/18/15 1852 12/18/15 2028   12/18/15 1900  vancomycin (VANCOCIN) IVPB 1000 mg/200 mL premix     1,000 mg 200 mL/hr over 60 Minutes Intravenous  Once 12/18/15 1852 12/18/15 2046      Subjective:   [name] seen and examined today.  Complains of some abdominal pain.  Currently eating.  Denies nausea and vomiting. Denies chest pain, shortness of breath, headache, dizziness.  Objective:   Vitals:   12/22/15 0533 12/22/15 0636 12/22/15 0951 12/22/15 1120  BP: (!) 184/62 (!) 160/100  (!) 184/87  Pulse: 95  (!) 135 93  Resp: 18   18  Temp: 98.9 F (37.2 C)   98.8 F (37.1 C)  TempSrc: Oral   Oral  SpO2: 97%  100% 98%  Weight: 68.5 kg (151 lb)     Height:        Intake/Output Summary (Last 24 hours) at 12/22/15 1250 Last data filed at 12/21/15 1630  Gross per 24 hour  Intake              850 ml  Output               50 ml  Net              800 ml   Filed Weights   12/20/15  0605 12/21/15 1006 12/22/15 0533  Weight: 71.3 kg (157 lb 3 oz) 70.4 kg (155 lb 3.2 oz) 68.5 kg (151 lb)    Exam  General: Well developed, well nourished, NAD, appears stated age  HEENT: NCAT, mucous membranes moist.   Cardiovascular: S1 S2 auscultated, no murmurs, RRR  Respiratory: Clear to auscultation bilaterally with equal chest rise  Abdomen: Soft, upper abdominal TTP, nondistended, + bowel sounds  Extremities: warm dry without cyanosis clubbing or edema  Neuro: AAOx2, nonfocal (?dementia)  Psych: Normal affect and demeanor   Data Reviewed: I have personally reviewed following labs and imaging studies  CBC:  Recent Labs Lab 12/18/15 1435 12/19/15 0326 12/20/15 0247 12/21/15 0608 12/22/15 0358  WBC 11.6* 11.6* 5.8 4.8 7.7  NEUTROABS  --  9.5* 4.2 3.0 5.5  HGB 13.7 11.4* 10.8* 12.1 11.0*  HCT 41.6 34.0* 32.1* 36.8 33.5*  MCV 84.9 84.4 84.3 84.2 85.0  PLT 244 188 133* 174 0000000   Basic Metabolic Panel:  Recent Labs Lab 12/18/15 1435 12/19/15 0326 12/20/15 0247 12/21/15 0608 12/22/15 0358  NA 138 137 139 144 142  K 3.7 3.5 3.5 3.7 4.3  CL 103 101 106 110 108  CO2 22 25 24 25 26   GLUCOSE 259* 308* 171* 184* 270*  BUN 9 10 6 6 8   CREATININE 1.14* 1.25* 1.13* 1.04* 1.04*  CALCIUM 9.4 8.6* 8.4* 9.2 9.1   GFR: Estimated Creatinine Clearance: 38.3 mL/min (by C-G formula based on SCr of 1.04 mg/dL (H)). Liver Function Tests:  Recent Labs Lab 12/18/15 1758 12/19/15 0326 12/20/15 0247 12/21/15 0608 12/22/15 0358  AST 530* 980* 371* 124* 67*  ALT 184* 473* 344* 242* 177*  ALKPHOS 173* 166* 155* 166* 138*  BILITOT 2.9* 3.8* 3.6* 1.9* 1.6*  PROT 6.8 5.4* 5.4* 5.8* 5.9*  ALBUMIN 3.7 2.9* 2.6* 2.7* 2.8*    Recent Labs Lab 12/18/15 1758  LIPASE 26   No results for input(s): AMMONIA in the last 168 hours. Coagulation Profile:  Recent Labs Lab 12/18/15 1758  INR 1.14   Cardiac Enzymes: No results for input(s): CKTOTAL, CKMB, CKMBINDEX,  TROPONINI in the last 168 hours. BNP (last 3 results) No results for input(s): PROBNP in the last 8760 hours. HbA1C: No results for input(s): HGBA1C in the last 72 hours. CBG:  Recent Labs Lab 12/21/15 1437 12/21/15 1715 12/21/15 2353 12/22/15 0618 12/22/15 1125  GLUCAP 149* 183* 354* 227* 271*   Lipid Profile: No results for input(s): CHOL, HDL, LDLCALC, TRIG, CHOLHDL, LDLDIRECT in the last 72 hours. Thyroid Function Tests: No results for input(s): TSH, T4TOTAL, FREET4, T3FREE, THYROIDAB in the last 72 hours. Anemia Panel: No results for input(s): VITAMINB12, FOLATE, FERRITIN, TIBC, IRON, RETICCTPCT in the last 72 hours. Urine analysis:  Component Value Date/Time   COLORURINE YELLOW 12/18/2015 1830   APPEARANCEUR CLOUDY (A) 12/18/2015 1830   LABSPEC 1.010 12/18/2015 1830   PHURINE 6.0 12/18/2015 1830   GLUCOSEU 500 (A) 12/18/2015 1830   HGBUR NEGATIVE 12/18/2015 1830   BILIRUBINUR NEGATIVE 12/18/2015 [place] 12/18/2015 1830   PROTEINUR NEGATIVE 12/18/2015 1830   UROBILINOGEN 0.2 11/20/2014 1405   NITRITE NEGATIVE 12/18/2015 1830   LEUKOCYTESUR NEGATIVE 12/18/2015 1830   Sepsis Labs: @LABRCNTIP (procalcitonin:4,lacticidven:4)  ) Recent Results (from the past 240 hour(s))  Urine culture     Status: Abnormal   Collection Time: 12/18/15  6:30 PM  Result Value Ref Range Status   Specimen Description URINE, RANDOM  Final   Special Requests NONE  Final   Culture >=100,000 COLONIES/mL KLEBSIELLA PNEUMONIAE (A)  Final   Report Status 12/20/2015 FINAL  Final   Organism ID, Bacteria KLEBSIELLA PNEUMONIAE (A)  Final      Susceptibility   Klebsiella pneumoniae - MIC*    AMPICILLIN >=32 RESISTANT Resistant     CEFAZOLIN <=4 SENSITIVE Sensitive     CEFTRIAXONE <=1 SENSITIVE Sensitive     CIPROFLOXACIN <=0.25 SENSITIVE Sensitive     GENTAMICIN <=1 SENSITIVE Sensitive     IMIPENEM 0.5 SENSITIVE Sensitive     NITROFURANTOIN 64 INTERMEDIATE Intermediate      TRIMETH/SULFA <=20 SENSITIVE Sensitive     AMPICILLIN/SULBACTAM 4 SENSITIVE Sensitive     PIP/TAZO <=4 SENSITIVE Sensitive     Extended ESBL NEGATIVE Sensitive     * >=100,000 COLONIES/mL KLEBSIELLA PNEUMONIAE  Blood Culture (routine x 2)     Status: None (Preliminary result)   Collection Time: 12/18/15  7:00 PM  Result Value Ref Range Status   Specimen Description BLOOD RIGHT ANTECUBITAL  Final   Special Requests BOTTLES DRAWN AEROBIC AND ANAEROBIC 5CC  Final   Culture NO GROWTH 3 DAYS  Final   Report Status PENDING  Incomplete  Blood Culture (routine x 2)     Status: Abnormal   Collection Time: 12/18/15  7:07 PM  Result Value Ref Range Status   Specimen Description BLOOD LEFT HAND  Final   Special Requests BOTTLES DRAWN AEROBIC AND ANAEROBIC 5CC  Final   Culture  Setup Time   Final    GRAM NEGATIVE RODS ANAEROBIC BOTTLE ONLY CRITICAL RESULT CALLED TO, READ BACK BY AND VERIFIED WITH: M MACCIA,PHARMD AT 1011 12/19/15 BY L BENFIELD    Culture KLEBSIELLA PNEUMONIAE (A)  Final   Report Status 12/21/2015 FINAL  Final   Organism ID, Bacteria KLEBSIELLA PNEUMONIAE  Final      Susceptibility   Klebsiella pneumoniae - MIC*    AMPICILLIN 16 RESISTANT Resistant     CEFAZOLIN <=4 SENSITIVE Sensitive     CEFEPIME <=1 SENSITIVE Sensitive     CEFTAZIDIME <=1 SENSITIVE Sensitive     CEFTRIAXONE <=1 SENSITIVE Sensitive     CIPROFLOXACIN <=0.25 SENSITIVE Sensitive     GENTAMICIN <=1 SENSITIVE Sensitive     IMIPENEM <=0.25 SENSITIVE Sensitive     TRIMETH/SULFA <=20 SENSITIVE Sensitive     AMPICILLIN/SULBACTAM 4 SENSITIVE Sensitive     PIP/TAZO <=4 SENSITIVE Sensitive     Extended ESBL NEGATIVE Sensitive     * KLEBSIELLA PNEUMONIAE  Blood Culture ID Panel (Reflexed)     Status: Abnormal   Collection Time: 12/18/15  7:07 PM  Result Value Ref Range Status   Enterococcus species NOT DETECTED NOT DETECTED Final   Listeria monocytogenes NOT DETECTED NOT DETECTED Final  Staphylococcus species  NOT DETECTED NOT DETECTED Final   Staphylococcus aureus NOT DETECTED NOT DETECTED Final   Streptococcus species NOT DETECTED NOT DETECTED Final   Streptococcus agalactiae NOT DETECTED NOT DETECTED Final   Streptococcus pneumoniae NOT DETECTED NOT DETECTED Final   Streptococcus pyogenes NOT DETECTED NOT DETECTED Final   Acinetobacter baumannii NOT DETECTED NOT DETECTED Final   Enterobacteriaceae species DETECTED (A) NOT DETECTED Final    Comment: CRITICAL RESULT CALLED TO, READ BACK BY AND VERIFIED WITH: M MACCIA,PHARMD AT 1011 12/19/15 BY L BENFIELD    Enterobacter cloacae complex NOT DETECTED NOT DETECTED Final   Escherichia coli NOT DETECTED NOT DETECTED Final   Klebsiella oxytoca NOT DETECTED NOT DETECTED Final   Klebsiella pneumoniae DETECTED (A) NOT DETECTED Final    Comment: CRITICAL RESULT CALLED TO, READ BACK BY AND VERIFIED WITH: M MACCIA,PHARMD AT 1011 12/19/15 BY L BENFIELD    Proteus species NOT DETECTED NOT DETECTED Final   Serratia marcescens NOT DETECTED NOT DETECTED Final   Carbapenem resistance NOT DETECTED NOT DETECTED Final   Haemophilus influenzae NOT DETECTED NOT DETECTED Final   Neisseria meningitidis NOT DETECTED NOT DETECTED Final   Pseudomonas aeruginosa NOT DETECTED NOT DETECTED Final   Candida albicans NOT DETECTED NOT DETECTED Final   Candida glabrata NOT DETECTED NOT DETECTED Final   Candida krusei NOT DETECTED NOT DETECTED Final   Candida parapsilosis NOT DETECTED NOT DETECTED Final   Candida tropicalis NOT DETECTED NOT DETECTED Final  Surgical pcr screen     Status: None   Collection Time: 12/21/15  5:21 AM  Result Value Ref Range Status   MRSA, PCR NEGATIVE NEGATIVE Final   Staphylococcus aureus NEGATIVE NEGATIVE Final    Comment:        The Xpert SA Assay (FDA approved for NASAL specimens in patients over 52 years of age), is one component of a comprehensive surveillance program.  Test performance has been validated by [facility] for  patients greater than or equal to 60 year old. It is not intended to diagnose infection nor to guide or monitor treatment.       Radiology Studies: Dg Cholangiogram Operative  Result Date: 12/21/2015 CLINICAL DATA:  Intraoperative cholangiogram during laparoscopic cholecystectomy. EXAM: INTRAOPERATIVE CHOLANGIOGRAM FLUOROSCOPY TIME:  32 seconds COMPARISON:  Right upper quadrant abdominal ultrasound - 12/20/2015 FINDINGS: Intraoperative cholangiographic images of the right upper abdominal quadrant during laparoscopic cholecystectomy are provided for review. Surgical clips overlie the expected location of the gallbladder fossa. Contrast injection demonstrates selective cannulation of the central aspect of the cystic duct. There is passage of contrast through the central aspect of the cystic duct with filling of a markedly dilated common bile duct. There is passage of contrast though the CBD with very faint opacification of the duodenum. There is minimal reflux of injected contrast into the common hepatic duct and central aspect of a mildly dilated intrahepatic biliary system. There are no discrete filling defects within the opacified portions of the biliary system to suggest the presence of choledocholithiasis. IMPRESSION: 1. Marked dilatation of the common bile duct without evidence of choledocholithiasis. 2. Minimal passage of contrast into the duodenum, again, without discrete intraluminal filling defect. Findings are nonspecific though could be seen in the setting of biliary sphincter spasm/stenosis. Correlation with the operative report is recommended. Further evaluation with ERCP could be performed as indicated. Electronically Signed   By: Sandi Mariscal M.D.   On: 12/21/2015 15:42     Scheduled Meds: . famotidine (PEPCID) IV  20 mg Intravenous Q12H  . insulin aspart  0-9 Units Subcutaneous TID WC  . lithium carbonate  150 mg Oral Daily  . mirtazapine  30 mg Oral QHS  . oxybutynin  5 mg Oral  BID  . piperacillin-tazobactam (ZOSYN)  IV  3.375 g Intravenous Q8H  . pregabalin  75 mg Oral BID  . sodium chloride flush  3 mL Intravenous Q12H  . Vilazodone HCl  20 mg Oral Daily   Continuous Infusions: . sodium chloride 75 mL/hr at 12/22/15 1014     LOS: 4 days   Time Spent in minutes   30 minutes  Addison Freimuth D.O. on 12/22/2015 at 12:50 PM  Between 7am to 7pm - Pager - [phone]  After 7pm go to [email or web address] - password [email or web address]  And look for the night coverage person covering for me after hours  [facility] Office  [phone]

## 2015-12-23 DIAGNOSIS — A419 Sepsis, unspecified organism: Secondary | ICD-10-CM | POA: Diagnosis not present

## 2015-12-23 DIAGNOSIS — R7989 Other specified abnormal findings of blood chemistry: Secondary | ICD-10-CM | POA: Diagnosis not present

## 2015-12-23 DIAGNOSIS — Z23 Encounter for immunization: Secondary | ICD-10-CM | POA: Diagnosis not present

## 2015-12-23 DIAGNOSIS — E785 Hyperlipidemia, unspecified: Secondary | ICD-10-CM

## 2015-12-23 DIAGNOSIS — I10 Essential (primary) hypertension: Secondary | ICD-10-CM

## 2015-12-23 DIAGNOSIS — R109 Unspecified abdominal pain: Secondary | ICD-10-CM | POA: Diagnosis not present

## 2015-12-23 DIAGNOSIS — K819 Cholecystitis, unspecified: Secondary | ICD-10-CM | POA: Diagnosis not present

## 2015-12-23 DIAGNOSIS — I5032 Chronic diastolic (congestive) heart failure: Secondary | ICD-10-CM | POA: Diagnosis not present

## 2015-12-23 LAB — CULTURE, BLOOD (ROUTINE X 2): Culture: NO GROWTH

## 2015-12-23 LAB — COMPREHENSIVE METABOLIC PANEL
ALT: 98 U/L — ABNORMAL HIGH (ref 14–54)
ANION GAP: 5 (ref 5–15)
AST: 27 U/L (ref 15–41)
Albumin: 2.3 g/dL — ABNORMAL LOW (ref 3.5–5.0)
Alkaline Phosphatase: 98 U/L (ref 38–126)
BUN: 8 mg/dL (ref 6–20)
CHLORIDE: 107 mmol/L (ref 101–111)
CO2: 26 mmol/L (ref 22–32)
CREATININE: 0.93 mg/dL (ref 0.44–1.00)
Calcium: 8.3 mg/dL — ABNORMAL LOW (ref 8.9–10.3)
GFR, EST NON AFRICAN AMERICAN: 55 mL/min — AB (ref >60)
Glucose, Bld: 164 mg/dL — ABNORMAL HIGH (ref 65–99)
Potassium: 3.3 mmol/L — ABNORMAL LOW (ref 3.5–5.1)
SODIUM: 138 mmol/L (ref 135–145)
Total Bilirubin: 0.7 mg/dL (ref 0.3–1.2)
Total Protein: 5 g/dL — ABNORMAL LOW (ref 6.5–8.1)

## 2015-12-23 LAB — GLUCOSE, CAPILLARY
GLUCOSE-CAPILLARY: 154 mg/dL — AB (ref 65–99)
GLUCOSE-CAPILLARY: 240 mg/dL — AB (ref 65–99)

## 2015-12-23 MED ORDER — CEFUROXIME AXETIL 500 MG PO TABS: 500.0000 mg | ORAL_TABLET | Freq: Two times a day (BID) | ORAL | 0 refills | Status: DC

## 2015-12-23 MED ORDER — POTASSIUM CHLORIDE CRYS ER 20 MEQ PO TBCR
40.0000 meq | EXTENDED_RELEASE_TABLET | Freq: Once | ORAL | Status: AC
  Administered 2015-12-23: 40 meq via ORAL
  Filled 2015-12-23: qty 2

## 2015-12-23 NOTE — Discharge Summary (Signed)
Physician Discharge Summary  [name] [id number] DOB: 19-Oct-1929 DOA: 12/18/2015  PCP: Irven Shelling, MD  Admit date: 12/18/2015 Discharge date: 12/23/2015  Time spent: 45 minutes  Recommendations for Outpatient Follow-up:  Patient will be discharged to home.  Patient will need to follow up with primary care provider within one week of discharge, repeat CMP.  Your lipitor was held, follow up with your doctor before restarting. Follow up with general surgery in 2-4 weeks.  Follow up with Dr. Paulita Fujita, gastroenterology, in 3-4 weeks.  Patient should continue medications as prescribed.  Patient should follow a heart healthy/carb modified diet.   Discharge Diagnoses:  Sepsis secondary to klebsiella pneumonia bacteremia Abdominal pain/nausea vomiting secondary to unknown source Elevated LFTs Diabetes mellitus, type II Chronic diastolic heart failure CKD, stage III Depression Hypokalemia  Discharge Condition: Stable  Diet recommendation: heart healthy/carb modified diet  Filed Weights   12/21/15 1006 12/22/15 0533 12/23/15 0530  Weight: 70.4 kg (155 lb 3.2 oz) 68.5 kg (151 lb) 71.6 kg (157 lb 14.4 oz)    History of present illness:  On 12/18/2015 by Dr. Tennis Must [name] is a 82 y.o. female with medical history significant of CP, chronic interstitial cystitis, Chronic back pain (postlaminectomy syndrome),stage 3 CKD, type 2 diabetes, GERD, hyperlipidemia, hypertension, macular degeneration, microhematuria, osteopenia, pernicious anemia, right carpal tunnel syndrome who is coming to the emergency department due to abdominal pain associated with nausea, emesis and fever.  Per patient's daughter, she started having epigastric pain associated with nausea about 10:30 AM. The pain is dull, constant and radiates to her back similar to her previous episodes of cholangitis. The patient has had low-grade fevers, chills, fatigue and malaise. He denies diarrhea, constipation,  melena or hematochezia.  She complains of occasional suprapubic pain, but no dysuria. She has a history of interstitial cystitis. Her urinalysis was normal.  Hospital Course:  Sepsis secondary to klebsiella pneumonia bacteremia -Appears to be improving -Blood culture from 10/15 +kleb pneumonia -repeat blood cultures from 10/17 show no growth to date -was placed on IV zosyn, will discharge with ceftin (needs 14 days total of treatment)  Abdominal pain/nausea vomiting secondary to unknown source -Abdominal US: positive for changes suggestive of cholecystitis -MRCP showed no obstruction -GI consulted and appreciated, recommended surgery consult -General surgery consulted and appreciated, laparoscopic cholecystectomy 10/18 -Spoke with Dr. Lucia Gaskins on 10/18, did not feel that gallbladder was the culprit as it had a normal appearance -Continue pain control, antiemetics as needed -Follow up with GI in 3-4 weeks  Elevated LFTs -Possibly secondary to the above, continue to trend downward -?stone passage -Continue supportive care -statin held, will need repeat CMP before restarting  Diabetes mellitus, type II -Continue ISS and CBG monitoring   Chronic diastolic heart failure -Currently euvolemic -Monitored intake, output, daily weights  CKD, stage III -Creatinine peaked at 1.25. Currently 0.93 -Continue to monitor BMP  Depression -Continue lithium and mirtazapine  Hypokalemia -Potassium 3.3, given replacement -Repeat BMP in one week  Consultants General surgery Gastroenterology   Procedures  Abdominal US MRCP  Discharge Exam: Vitals:   12/22/15 2218 12/23/15 0530  BP: (!) 141/52 (!) 136/47  Pulse: 88 72  Resp: 18 18  Temp: 98.8 F (37.1 C) 98.2 F (36.8 C)   Patient states she is feeling better today. No longer having abdominal pain. Denies chest pain, shortness of breath, nausea, vomiting, dizziness, headache. States she is ready to go home.    Exam  General: Well developed, well nourished, NAD  HEENT: NCAT, mucous membranes moist.   Cardiovascular: S1 S2 auscultated, no murmurs, RRR  Respiratory: Clear to auscultation bilaterally with equal chest rise  Abdomen: Soft, nontender, nondistended, + bowel sounds, incision clean  Extremities: warm dry without cyanosis clubbing or edema  Neuro: AAOx3, nonfocal   Psych: Normal affect and demeanor, pleasant  Discharge Instructions Discharge Instructions    Discharge instructions    Complete by:  As directed    Patient will be discharged to home.  Patient will need to follow up with primary care provider within one week of discharge, repeat CMP.  Your lipitor was held, follow up with your doctor before restarting. Follow up with general surgery in 2-4 weeks.  Follow up with Dr. Paulita Fujita, gastroenterology, in 3-4 weeks.  Patient should continue medications as prescribed.  Patient should follow a heart healthy/carb modified diet.     Current Discharge Medication List    START taking these medications   Details  cefUROXime (CEFTIN) 500 MG tablet Take 1 tablet (500 mg total) by mouth 2 (two) times daily with a meal. Qty: 20 tablet, Refills: 0      CONTINUE these medications which have NOT CHANGED   Details  acetaminophen (TYLENOL) 500 MG tablet Take 500 mg by mouth every 6 (six) hours as needed for mild pain.    amLODipine (NORVASC) 5 MG tablet Take 1 tablet (5 mg total) by mouth daily. Qty: 30 tablet, Refills: 3    aspirin EC 81 MG tablet Take 81 mg by mouth daily.    cholecalciferol (VITAMIN D) 1000 UNITS tablet Take 2,000 Units by mouth daily.     conjugated estrogens (PREMARIN) vaginal cream Place 1 Applicatorful vaginally daily as needed (burning).     cyanocobalamin (,VITAMIN B-12,) 1000 MCG/ML injection Inject 1,000 mcg into the muscle every 30 (thirty) days.    furosemide (LASIX) 20 MG tablet Take 2 tablets (40 mg total) by mouth daily. Qty: 30 tablet     glimepiride (AMARYL) 1 MG tablet Take 2 tablets (2 mg total) by mouth every morning. Qty: 30 tablet, Refills: 0    lisinopril (PRINIVIL,ZESTRIL) 20 MG tablet Take 1 tablet (20 mg total) by mouth every evening. Qty: 60 tablet, Refills: 0    lithium carbonate 150 MG capsule Take 150 mg by mouth daily.    metoprolol (LOPRESSOR) 50 MG tablet Take 1 tablet (50 mg total) by mouth 2 (two) times daily. Qty: 60 tablet, Refills: 0    mirtazapine (REMERON) 30 MG tablet Take 30 mg by mouth at bedtime.     nitroGLYCERIN (NITROSTAT) 0.4 MG SL tablet Place 1 tablet (0.4 mg total) under the tongue every 5 (five) minutes as needed for chest pain. Qty: 30 tablet, Refills: 0    omeprazole (PRILOSEC) 40 MG capsule Take 40 mg by mouth every evening.     oxybutynin (DITROPAN) 5 MG tablet Take 5 mg by mouth 2 (two) times daily.     pregabalin (LYRICA) 75 MG capsule Take 75 mg by mouth 2 (two) times daily.    traMADol (ULTRAM) 50 MG tablet Take 1 tablet (50 mg total) by mouth 2 (two) times daily as needed (pain). Qty: 30 tablet, Refills: 0    Vilazodone HCl 20 MG TABS Take 20 mg by mouth daily. Viibryd    zolpidem (AMBIEN) 5 MG tablet Take 2.5 mg by mouth at bedtime.       STOP taking these medications     atorvastatin (LIPITOR) 10 MG tablet  levofloxacin (LEVAQUIN) 750 MG tablet        Allergies  Allergen Reactions  . Codeine Other (See Comments)    slurred speech   Follow-up Information    NEWMAN,DAVID H, MD. Go on 01/12/2016.   Specialty:  General Surgery Why:  Office will call you to set up appointment@8 :30am please arrive @8 :00am Contact information: [address] [place] [address] [phone]        OUTLAW,WILLIAM M, MD. Schedule an appointment as soon as possible for a visit in 3 week(s).   Specialty:  Gastroenterology Why:  Hospital Follow up  Contact information: [address] [place] [address] [phone]        Irven Shelling, MD. Schedule an appointment as soon as possible for a visit in 1 week(s).   Specialty:  Internal Medicine Why:  Hospital follow up, repeat labs Contact information: [address] [place] [address] [phone]            The results of significant diagnostics from this hospitalization (including imaging, microbiology, ancillary and laboratory) are listed below for reference.    Significant Diagnostic Studies: Dg Cholangiogram Operative  Result Date: 12/21/2015 CLINICAL DATA:  Intraoperative cholangiogram during laparoscopic cholecystectomy. EXAM: INTRAOPERATIVE CHOLANGIOGRAM FLUOROSCOPY TIME:  32 seconds COMPARISON:  Right upper quadrant abdominal ultrasound - 12/20/2015 FINDINGS: Intraoperative cholangiographic images of the right upper abdominal quadrant during laparoscopic cholecystectomy are provided for review. Surgical clips overlie the expected location of the gallbladder fossa. Contrast injection demonstrates selective cannulation of the central aspect of the cystic duct. There is passage of contrast through the central aspect of the cystic duct with filling of a markedly dilated common bile duct. There is passage of contrast though the CBD with very faint opacification of the duodenum. There is minimal reflux of injected contrast into the common hepatic duct and central aspect of a mildly dilated intrahepatic biliary system. There are no discrete filling defects within the opacified portions of the biliary system to suggest the presence of choledocholithiasis. IMPRESSION: 1. Marked dilatation of the common bile duct without evidence of choledocholithiasis. 2. Minimal passage of contrast into the duodenum, again, without discrete intraluminal filling defect. Findings are nonspecific though could be seen in the setting of biliary sphincter spasm/stenosis. Correlation with the operative report is recommended. Further evaluation with ERCP could be performed as  indicated. Electronically Signed   By: Sandi Mariscal M.D.   On: 12/21/2015 15:42   Mr 3d Recon At Scanner  Result Date: 12/19/2015 CLINICAL DATA:  82 year old female presenting with abnormal LFTs. Abdominal pain and nausea and vomiting. History of prior cholangitis. EXAM: MRI ABDOMEN WITHOUT AND WITH CONTRAST (INCLUDING MRCP) TECHNIQUE: Multiplanar multisequence MR imaging of the abdomen was performed both before and after the administration of intravenous contrast. Heavily T2-weighted images of the biliary and pancreatic ducts were obtained, and three-dimensional MRCP images were rendered by post processing. CONTRAST:  31mL MULTIHANCE GADOBENATE DIMEGLUMINE 529 MG/ML IV SOLN COMPARISON:  Abdominal MRI dated 02/05/2015 FINDINGS: Evaluation of this exam is limited due to respiratory motion artifact. Lower chest: No acute findings. Hepatobiliary: The liver appears unremarkable. No abnormal enhancement or focal lesion identified. Mild intrahepatic biliary ductal dilatation. There is dilatation of the extrahepatic biliary tree and common bile duct. The common bile duct measures up to 14 mm proximally and 13 mm in diameter in the head of the pancreas. This findings are similar to the prior MRI. The gallbladder mildly distended. Trace fluid  is noted adjacent to the gallbladder as seen on the prior studies. No stone identified within the gallbladder or in the CBD. Ultrasound may provide better evaluation of the gallbladder. No focal stenosis of the biliary tree. There is no significant irregularity or beaded appearance of the bile ducts. No definite abnormal enhancement to suggest cholangitis. No occlusive delivery lesions identified. Pancreas: There is a 2.5 cm simple appearing in the uncinate process of the pancreas as seen on the prior MRI. The pancreas otherwise appears unremarkable. The bony pancreatic duct measures approximately 4 mm in diameter. Spleen:  Within normal limits in size and appearance.  Adrenals/Urinary Tract: The adrenal glands appear unremarkable. Small left renal measuring up to approximately 15 mm. There is mild atrophy of the renal parenchyma. No hydronephrosis. Stomach/Bowel: Visualized portions within the abdomen are unremarkable. Vascular/Lymphatic: No pathologically enlarged lymph nodes identified. No abdominal aortic aneurysm demonstrated. Other:  None Musculoskeletal: There is degenerative changes of the spine. IMPRESSION: Stable appearing dilatation of the biliary tree similar to prior study and without evidence of obstructing stone or lesion. No definite evidence of acute cholangitis by MRI. Evaluation however is limited due to respiratory motion artifact. Correlation with clinical exam recommended. Small pericholecystic fluid as seen on the prior studies. Ultrasound may provide better evaluation of the gallbladder. Stable appearing simple cystic lesion at the head of the pancreas. Electronically Signed   By: Anner Crete M.D.   On: 12/19/2015 04:17   US Abdomen Limited  Result Date: 12/20/2015 CLINICAL DATA:  Abnormal LFTs. EXAM: US ABDOMEN LIMITED - RIGHT UPPER QUADRANT COMPARISON:  MRI 12/19/2015 . FINDINGS: Gallbladder: Mild amount of gallbladder sludge cannot be excluded. Severe thickening of the gallbladder wall to 7 mm noted. Small amount of pericholecystic fluid noted. Positive ultrasound Murphy's sign noted. Findings suggest cholecystitis. Common bile duct: Diameter: 17 mm. Liver: No focal hepatic abnormality identified. Echotexture normal. Intrahepatic biliary ductal dilatation noted. Similar finding noted on prior MRI of 12/19/2015. IMPRESSION: 1. Mild of mouth of gallbladder sludge cannot be excluded. Severe thickening of the gallbladder wall up to 7 mm noted. Small amount of pericholecystic fluid noted. Positive ultrasound Murphy sign noted. Findings suggest cholecystitis. 2. Distention of the common bile duct and intrahepatic biliary ducts. Similar finding noted  on prior MRI of 12/19/2015. Electronically Signed   By: Marcello Moores  Register   On: 12/20/2015 10:25   Dg Chest Portable 1 View  Result Date: 12/18/2015 CLINICAL DATA:  Chest pain.  Concern for dissection. EXAM: PORTABLE CHEST 1 VIEW COMPARISON:  02/06/2015 FINDINGS: Heart size mildly enlarged. Negative for heart failure. Mild streaky markings in the bases most consistent with scarring or atelectasis. Negative for pneumonia or effusion. Atherosclerotic calcification in the aorta. Mediastinum not widened. IMPRESSION: Mild bibasilar atelectasis/ scarring. Electronically Signed   By: Franchot Gallo M.D.   On: 12/18/2015 18:26   Mr Abdomen Mrcp Moise Boring Contast  Result Date: 12/19/2015 CLINICAL DATA:  82 year old female presenting with abnormal LFTs. Abdominal pain and nausea and vomiting. History of prior cholangitis. EXAM: MRI ABDOMEN WITHOUT AND WITH CONTRAST (INCLUDING MRCP) TECHNIQUE: Multiplanar multisequence MR imaging of the abdomen was performed both before and after the administration of intravenous contrast. Heavily T2-weighted images of the biliary and pancreatic ducts were obtained, and three-dimensional MRCP images were rendered by post processing. CONTRAST:  71mL MULTIHANCE GADOBENATE DIMEGLUMINE 529 MG/ML IV SOLN COMPARISON:  Abdominal MRI dated 02/05/2015 FINDINGS: Evaluation of this exam is limited due to respiratory motion artifact. Lower chest: No acute findings. Hepatobiliary: The  liver appears unremarkable. No abnormal enhancement or focal lesion identified. Mild intrahepatic biliary ductal dilatation. There is dilatation of the extrahepatic biliary tree and common bile duct. The common bile duct measures up to 14 mm proximally and 13 mm in diameter in the head of the pancreas. This findings are similar to the prior MRI. The gallbladder mildly distended. Trace fluid is noted adjacent to the gallbladder as seen on the prior studies. No stone identified within the gallbladder or in the CBD.  Ultrasound may provide better evaluation of the gallbladder. No focal stenosis of the biliary tree. There is no significant irregularity or beaded appearance of the bile ducts. No definite abnormal enhancement to suggest cholangitis. No occlusive delivery lesions identified. Pancreas: There is a 2.5 cm simple appearing in the uncinate process of the pancreas as seen on the prior MRI. The pancreas otherwise appears unremarkable. The bony pancreatic duct measures approximately 4 mm in diameter. Spleen:  Within normal limits in size and appearance. Adrenals/Urinary Tract: The adrenal glands appear unremarkable. Small left renal measuring up to approximately 15 mm. There is mild atrophy of the renal parenchyma. No hydronephrosis. Stomach/Bowel: Visualized portions within the abdomen are unremarkable. Vascular/Lymphatic: No pathologically enlarged lymph nodes identified. No abdominal aortic aneurysm demonstrated. Other:  None Musculoskeletal: There is degenerative changes of the spine. IMPRESSION: Stable appearing dilatation of the biliary tree similar to prior study and without evidence of obstructing stone or lesion. No definite evidence of acute cholangitis by MRI. Evaluation however is limited due to respiratory motion artifact. Correlation with clinical exam recommended. Small pericholecystic fluid as seen on the prior studies. Ultrasound may provide better evaluation of the gallbladder. Stable appearing simple cystic lesion at the head of the pancreas. Electronically Signed   By: Anner Crete M.D.   On: 12/19/2015 04:17   Ct Angio Chest/abd/pel For Dissection W And/or Wo Contrast  Result Date: 12/18/2015 CLINICAL DATA:  Chest pain.  Incontinence.  Concern for dissection. EXAM: CT ANGIOGRAPHY CHEST, ABDOMEN AND PELVIS TECHNIQUE: Multidetector CT imaging through the chest, abdomen and pelvis was performed using the standard protocol during bolus administration of intravenous contrast. Multiplanar  reconstructed images and MIPs were obtained and reviewed to evaluate the vascular anatomy. CONTRAST:  80 cc Isovue 370 intravenous COMPARISON:  MRCP 06/19/2015. FINDINGS: CTA CHEST FINDINGS Cardiovascular: Noncontrast phase shows no intramural hematoma in the aorta. No aortic dissection, aneurysm, or stenosis. Atherosclerosis, including the aorta and coronary arteries. No cardiomegaly. No pericardial effusion. Limited evaluation the pulmonary arteries due to motion. No central filling defect identified. Mediastinum/Nodes: No adenopathy.  Negative esophagus Lungs/Pleura: Motion degraded evaluation. Mild atelectasis. There is no edema, consolidation, effusion, or pneumothorax. Musculoskeletal: See below Review of the MIP images confirms the above findings. CTA ABDOMEN AND PELVIS FINDINGS VASCULAR Aorta: Diffuse atherosclerosis.  No aneurysm or stenosis. Celiac: High-grade narrowing, with narrowing accentuated by median arcuate ligament given the morphology. Mild hypertrophy of peripancreatic collaterals. SMA: Atherosclerosis without stenosis. Renals: Atherosclerosis without suspected flow limiting stenosis. Single bilateral renal arteries. IMA: Patent Inflow: Atheromatous without stenosis or occlusion.  No aneurysm. Veins: No obvious venous abnormality within the limitations of this arterial phase study. Review of the MIP images confirms the above findings. NON-VASCULAR Hepatobiliary: No focal liver abnormality.Chronic dilatation of the intrahepatic and extrahepatic biliary tree without visible mass or choledocholithiasis. The common bile duct measures up to 19 mm. The gallbladder is distended and there is mild pericholecystic fluid, similar to 02/05/2015 MRCP. Pancreas: Chronic prominence of the main pancreatic duct measuring up  to 4-5 mm. Unilocular pancreatic head cyst measuring 24 mm, which had layering secretions on previous MRI. Punctate calcifications seen dependently today. Spleen: Unremarkable.  Adrenals/Urinary Tract: Negative adrenals. No hydronephrosis or stone. Simple appearing left renal cyst. The bladder appears thick walled with mild perivesicular stranding. Sensitivity is diminished by streak artifact from left hip prosthesis. Stomach/Bowel: Extensive distal colonic diverticulosis without active inflammation. No bowel obstruction. No appendicitis. Mid duodenal diverticulum wrapping behind the pancreatic head. Lymphatic:   No mass or adenopathy. Reproductive:No pathologic findings. Other: No ascites or pneumoperitoneum. Musculoskeletal: Severe lumbar disc degeneration with extensive sclerosis and endplate irregularity. Remote L1 superior endplate fracture. No acute fracture noted. Total left hip arthroplasty without adverse finding. Review of the MIP images confirms the above findings. IMPRESSION: 1. Negative for aortic dissection or other acute arterial finding. 2. Suspect cystitis.  Correlate with urinalysis. 3. Chronic biliary tree dilatation. Reference MRCP 06/19/2015. There is mild pericholecystic fluid, raising the possibility of cholecystitis, but this appearance was also present on December 2016 MRCP. 4. Unilocular, benign-appearing pancreatic head mass that is unchanged from MRCP. 5. 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 Electronically Signed   By: Monte Fantasia M.D.   On: 12/18/2015 20:42    Microbiology: Recent Results (from the past 240 hour(s))  Urine culture     Status: Abnormal   Collection Time: 12/18/15  6:30 PM  Result Value Ref Range Status   Specimen Description URINE, RANDOM  Final   Special Requests NONE  Final   Culture >=100,000 COLONIES/mL KLEBSIELLA PNEUMONIAE (A)  Final   Report Status 12/20/2015 FINAL  Final   Organism ID, Bacteria KLEBSIELLA PNEUMONIAE (A)  Final      Susceptibility   Klebsiella pneumoniae - MIC*    AMPICILLIN >=32 RESISTANT Resistant     CEFAZOLIN  <=4 SENSITIVE Sensitive     CEFTRIAXONE <=1 SENSITIVE Sensitive     CIPROFLOXACIN <=0.25 SENSITIVE Sensitive     GENTAMICIN <=1 SENSITIVE Sensitive     IMIPENEM 0.5 SENSITIVE Sensitive     NITROFURANTOIN 64 INTERMEDIATE Intermediate     TRIMETH/SULFA <=20 SENSITIVE Sensitive     AMPICILLIN/SULBACTAM 4 SENSITIVE Sensitive     PIP/TAZO <=4 SENSITIVE Sensitive     Extended ESBL NEGATIVE Sensitive     * >=100,000 COLONIES/mL KLEBSIELLA PNEUMONIAE  Blood Culture (routine x 2)     Status: None   Collection Time: 12/18/15  7:00 PM  Result Value Ref Range Status   Specimen Description BLOOD RIGHT ANTECUBITAL  Final   Special Requests BOTTLES DRAWN AEROBIC AND ANAEROBIC 5CC  Final   Culture NO GROWTH 5 DAYS  Final   Report Status 12/23/2015 FINAL  Final  Blood Culture (routine x 2)     Status: Abnormal   Collection Time: 12/18/15  7:07 PM  Result Value Ref Range Status   Specimen Description BLOOD LEFT HAND  Final   Special Requests BOTTLES DRAWN AEROBIC AND ANAEROBIC 5CC  Final   Culture  Setup Time   Final    GRAM NEGATIVE RODS ANAEROBIC BOTTLE ONLY CRITICAL RESULT CALLED TO, READ BACK BY AND VERIFIED WITH: M MACCIA,PHARMD AT 1011 12/19/15 BY L BENFIELD    Culture KLEBSIELLA PNEUMONIAE (A)  Final   Report Status 12/21/2015 FINAL  Final   Organism ID, Bacteria KLEBSIELLA PNEUMONIAE  Final      Susceptibility   Klebsiella pneumoniae - MIC*    AMPICILLIN 16 RESISTANT Resistant     CEFAZOLIN <=4 SENSITIVE  Sensitive     CEFEPIME <=1 SENSITIVE Sensitive     CEFTAZIDIME <=1 SENSITIVE Sensitive     CEFTRIAXONE <=1 SENSITIVE Sensitive     CIPROFLOXACIN <=0.25 SENSITIVE Sensitive     GENTAMICIN <=1 SENSITIVE Sensitive     IMIPENEM <=0.25 SENSITIVE Sensitive     TRIMETH/SULFA <=20 SENSITIVE Sensitive     AMPICILLIN/SULBACTAM 4 SENSITIVE Sensitive     PIP/TAZO <=4 SENSITIVE Sensitive     Extended ESBL NEGATIVE Sensitive     * KLEBSIELLA PNEUMONIAE  Blood Culture ID Panel (Reflexed)      Status: Abnormal   Collection Time: 12/18/15  7:07 PM  Result Value Ref Range Status   Enterococcus species NOT DETECTED NOT DETECTED Final   Listeria monocytogenes NOT DETECTED NOT DETECTED Final   Staphylococcus species NOT DETECTED NOT DETECTED Final   Staphylococcus aureus NOT DETECTED NOT DETECTED Final   Streptococcus species NOT DETECTED NOT DETECTED Final   Streptococcus agalactiae NOT DETECTED NOT DETECTED Final   Streptococcus pneumoniae NOT DETECTED NOT DETECTED Final   Streptococcus pyogenes NOT DETECTED NOT DETECTED Final   Acinetobacter baumannii NOT DETECTED NOT DETECTED Final   Enterobacteriaceae species DETECTED (A) NOT DETECTED Final    Comment: CRITICAL RESULT CALLED TO, READ BACK BY AND VERIFIED WITH: M MACCIA,PHARMD AT 1011 12/19/15 BY L BENFIELD    Enterobacter cloacae complex NOT DETECTED NOT DETECTED Final   Escherichia coli NOT DETECTED NOT DETECTED Final   Klebsiella oxytoca NOT DETECTED NOT DETECTED Final   Klebsiella pneumoniae DETECTED (A) NOT DETECTED Final    Comment: CRITICAL RESULT CALLED TO, READ BACK BY AND VERIFIED WITH: M MACCIA,PHARMD AT 1011 12/19/15 BY L BENFIELD    Proteus species NOT DETECTED NOT DETECTED Final   Serratia marcescens NOT DETECTED NOT DETECTED Final   Carbapenem resistance NOT DETECTED NOT DETECTED Final   Haemophilus influenzae NOT DETECTED NOT DETECTED Final   Neisseria meningitidis NOT DETECTED NOT DETECTED Final   Pseudomonas aeruginosa NOT DETECTED NOT DETECTED Final   Candida albicans NOT DETECTED NOT DETECTED Final   Candida glabrata NOT DETECTED NOT DETECTED Final   Candida krusei NOT DETECTED NOT DETECTED Final   Candida parapsilosis NOT DETECTED NOT DETECTED Final   Candida tropicalis NOT DETECTED NOT DETECTED Final  Culture, blood (routine x 2)     Status: None (Preliminary result)   Collection Time: 12/20/15 12:43 PM  Result Value Ref Range Status   Specimen Description BLOOD RIGHT ARM  Final   Special Requests  BOTTLES DRAWN AEROBIC AND ANAEROBIC 10CC  Final   Culture NO GROWTH 3 DAYS  Final   Report Status PENDING  Incomplete  Culture, blood (routine x 2)     Status: None (Preliminary result)   Collection Time: 12/20/15 12:55 PM  Result Value Ref Range Status   Specimen Description BLOOD RIGHT ARM  Final   Special Requests BOTTLES DRAWN AEROBIC AND ANAEROBIC 10CC  Final   Culture NO GROWTH 3 DAYS  Final   Report Status PENDING  Incomplete  Surgical pcr screen     Status: None   Collection Time: 12/21/15  5:21 AM  Result Value Ref Range Status   MRSA, PCR NEGATIVE NEGATIVE Final   Staphylococcus aureus NEGATIVE NEGATIVE Final    Comment:        The Xpert SA Assay (FDA approved for NASAL specimens in patients over 72 years of age), is one component of a comprehensive surveillance program.  Test performance has been validated by [facility] for patients  greater than or equal to 2 year old. It is not intended to diagnose infection nor to guide or monitor treatment.      Labs: Basic Metabolic Panel:  Recent Labs Lab 12/19/15 0326 12/20/15 0247 12/21/15 0608 12/22/15 0358 12/23/15 0241  NA 137 139 144 142 138  K 3.5 3.5 3.7 4.3 3.3*  CL 101 106 110 108 107  CO2 25 24 25 26 26   GLUCOSE 308* 171* 184* 270* 164*  BUN 10 6 6 8 8   CREATININE 1.25* 1.13* 1.04* 1.04* 0.93  CALCIUM 8.6* 8.4* 9.2 9.1 8.3*   Liver Function Tests:  Recent Labs Lab 12/19/15 0326 12/20/15 0247 12/21/15 0608 12/22/15 0358 12/23/15 0241  AST 980* 371* 124* 67* 27  ALT 473* 344* 242* 177* 98*  ALKPHOS 166* 155* 166* 138* 98  BILITOT 3.8* 3.6* 1.9* 1.6* 0.7  PROT 5.4* 5.4* 5.8* 5.9* 5.0*  ALBUMIN 2.9* 2.6* 2.7* 2.8* 2.3*    Recent Labs Lab 12/18/15 1758  LIPASE 26   No results for input(s): AMMONIA in the last 168 hours. CBC:  Recent Labs Lab 12/18/15 1435 12/19/15 0326 12/20/15 0247 12/21/15 0608 12/22/15 0358  WBC 11.6* 11.6* 5.8 4.8 7.7  NEUTROABS  --  9.5* 4.2 3.0 5.5  HGB  13.7 11.4* 10.8* 12.1 11.0*  HCT 41.6 34.0* 32.1* 36.8 33.5*  MCV 84.9 84.4 84.3 84.2 85.0  PLT 244 188 133* 174 181   Cardiac Enzymes: No results for input(s): CKTOTAL, CKMB, CKMBINDEX, TROPONINI in the last 168 hours. BNP: BNP (last 3 results)  Recent Labs  01/13/15 0926 01/14/15 1040  BNP 173.8* 45.0    ProBNP (last 3 results) No results for input(s): PROBNP in the last 8760 hours.  CBG:  Recent Labs Lab 12/22/15 1125 12/22/15 1651 12/22/15 2213 12/23/15 0557 12/23/15 1145  GLUCAP 271* 222* 205* 154* 240*       Signed:  Janayia Burggraf  [facility] 12/23/2015, 12:31 PM

## 2015-12-23 NOTE — Progress Notes (Signed)
Patient ID: [name], female   DOB: [birth year], 82 y.o.   MRN: [id number]  [name] Surgery Progress Note  2 Days Post-Op  Subjective: Feeling well today. Per patient she is going home today.  Objective: Vital signs in last 24 hours: Temp:  [98.2 F (36.8 C)-98.8 F (37.1 C)] 98.7 F (37.1 C) (10/20 1245) Pulse Rate:  [72-88] 78 (10/20 1245) Resp:  [18] 18 (10/20 1245) BP: (136-142)/(47-76) 142/76 (10/20 1245) SpO2:  [90 %-94 %] 94 % (10/20 1245) Weight:  [157 lb 14.4 oz (71.6 kg)] 157 lb 14.4 oz (71.6 kg) (10/20 0530) Last BM Date: 12/21/15  Intake/Output from previous day: 10/19 0701 - 10/20 0700 In: 4670 [P.O.:680; I.V.:3540; IV Piggyback:450] Out: 250 [Urine:250] Intake/Output this shift: Total I/O In: 120 [P.O.:120] Out: 200 [Urine:200]  PE: Gen:  Alert, NAD, pleasant Pulm:  Effort normal Abd: Soft, ND, appropriately tender, incisions C/D/I  Lab Results:   Recent Labs  12/21/15 0608 12/22/15 0358  WBC 4.8 7.7  HGB 12.1 11.0*  HCT 36.8 33.5*  PLT 174 181   BMET  Recent Labs  12/22/15 0358 12/23/15 0241  NA 142 138  K 4.3 3.3*  CL 108 107  CO2 26 26  GLUCOSE 270* 164*  BUN 8 8  CREATININE 1.04* 0.93  CALCIUM 9.1 8.3*   PT/INR No results for input(s): LABPROT, INR in the last 72 hours. CMP     Component Value Date/Time   NA 138 12/23/2015 0241   K 3.3 (L) 12/23/2015 0241   CL 107 12/23/2015 0241   CO2 26 12/23/2015 0241   GLUCOSE 164 (H) 12/23/2015 0241   BUN 8 12/23/2015 0241   CREATININE 0.93 12/23/2015 0241   CALCIUM 8.3 (L) 12/23/2015 0241   PROT 5.0 (L) 12/23/2015 0241   ALBUMIN 2.3 (L) 12/23/2015 0241   AST 27 12/23/2015 0241   ALT 98 (H) 12/23/2015 0241   ALKPHOS 98 12/23/2015 0241   BILITOT 0.7 12/23/2015 0241   GFRNONAA 55 (L) 12/23/2015 0241   GFRAA >60 12/23/2015 0241   Lipase     Component Value Date/Time   LIPASE 26 12/18/2015 1758       Studies/Results: Dg Cholangiogram Operative  Result Date:  12/21/2015 CLINICAL DATA:  Intraoperative cholangiogram during laparoscopic cholecystectomy. EXAM: INTRAOPERATIVE CHOLANGIOGRAM FLUOROSCOPY TIME:  32 seconds COMPARISON:  Right upper quadrant abdominal ultrasound - 12/20/2015 FINDINGS: Intraoperative cholangiographic images of the right upper abdominal quadrant during laparoscopic cholecystectomy are provided for review. Surgical clips overlie the expected location of the gallbladder fossa. Contrast injection demonstrates selective cannulation of the central aspect of the cystic duct. There is passage of contrast through the central aspect of the cystic duct with filling of a markedly dilated common bile duct. There is passage of contrast though the CBD with very faint opacification of the duodenum. There is minimal reflux of injected contrast into the common hepatic duct and central aspect of a mildly dilated intrahepatic biliary system. There are no discrete filling defects within the opacified portions of the biliary system to suggest the presence of choledocholithiasis. IMPRESSION: 1. Marked dilatation of the common bile duct without evidence of choledocholithiasis. 2. Minimal passage of contrast into the duodenum, again, without discrete intraluminal filling defect. Findings are nonspecific though could be seen in the setting of biliary sphincter spasm/stenosis. Correlation with the operative report is recommended. Further evaluation with ERCP could be performed as indicated. Electronically Signed   By: Sandi Mariscal M.D.   On: 12/21/2015 15:42  Anti-infectives: Anti-infectives    Start     Dose/Rate Route Frequency Ordered Stop   12/23/15 0000  cefUROXime (CEFTIN) 500 MG tablet     500 mg Oral 2 times daily with meals 12/23/15 1229     12/20/15 2200  piperacillin-tazobactam (ZOSYN) IVPB 3.375 g     3.375 g 12.5 mL/hr over 240 Minutes Intravenous Every 8 hours 12/20/15 1623     12/20/15 1145  piperacillin-tazobactam (ZOSYN) IVPB 3.375 g  Status:   Discontinued     3.375 g 12.5 mL/hr over 240 Minutes Intravenous Every 8 hours 12/20/15 1133 12/20/15 1623   12/20/15 1100  cefTRIAXone (ROCEPHIN) 2 g in dextrose 5 % 50 mL IVPB  Status:  Discontinued     2 g 100 mL/hr over 30 Minutes Intravenous Every 24 hours 12/20/15 1028 12/20/15 1133   12/19/15 1800  vancomycin (VANCOCIN) 1,250 mg in sodium chloride 0.9 % 250 mL IVPB  Status:  Discontinued     1,250 mg 166.7 mL/hr over 90 Minutes Intravenous Every 24 hours 12/18/15 1910 12/19/15 1019   12/19/15 0200  piperacillin-tazobactam (ZOSYN) IVPB 3.375 g  Status:  Discontinued     3.375 g 12.5 mL/hr over 240 Minutes Intravenous Every 8 hours 12/18/15 1910 12/20/15 1028   12/18/15 1900  piperacillin-tazobactam (ZOSYN) IVPB 3.375 g     3.375 g 100 mL/hr over 30 Minutes Intravenous  Once 12/18/15 1852 12/18/15 2028   12/18/15 1900  vancomycin (VANCOCIN) IVPB 1000 mg/200 mL premix     1,000 mg 200 mL/hr over 60 Minutes Intravenous  Once 12/18/15 1852 12/18/15 2046       Assessment/Plan S/p lap chole w/IOC - 12/20/2015 - D. Inga Noller - Normal appearing gall bladder during surgery, path reports chronic cholecystitis - POD 2 - LFT's continue to trend downward - abdominal pain improving, tolerating diet  Chronically dilated CBD  Plan - ready to discharge from a surgery standpoint. Will need to follow-up with Dr. Lucia Gaskins 2-4 weeks postop.   LOS: 5 days    Jerrye Beavers , [facility] Surgery 12/23/2015, 12:51 PM Pager: [phone] Consults: [phone] Mon-Fri 7:00 am-4:30 pm Sat-Sun 7:00 am-11:30 am  Agree with above.  Alphonsa Overall, MD, [facility] Surgery Pager: [phone] Office phone:  [phone]

## 2015-12-25 LAB — CULTURE, BLOOD (ROUTINE X 2)
Culture: NO GROWTH
Culture: NO GROWTH

## 2015-12-26 DIAGNOSIS — M545 Low back pain: Secondary | ICD-10-CM | POA: Diagnosis not present

## 2015-12-26 DIAGNOSIS — M5136 Other intervertebral disc degeneration, lumbar region: Secondary | ICD-10-CM | POA: Diagnosis not present

## 2015-12-30 DIAGNOSIS — I129 Hypertensive chronic kidney disease with stage 1 through stage 4 chronic kidney disease, or unspecified chronic kidney disease: Secondary | ICD-10-CM | POA: Diagnosis not present

## 2015-12-30 DIAGNOSIS — N183 Chronic kidney disease, stage 3 (moderate): Secondary | ICD-10-CM | POA: Diagnosis not present

## 2015-12-30 DIAGNOSIS — Z7984 Long term (current) use of oral hypoglycemic drugs: Secondary | ICD-10-CM | POA: Diagnosis not present

## 2015-12-30 DIAGNOSIS — A414 Sepsis due to anaerobes: Secondary | ICD-10-CM | POA: Diagnosis not present

## 2015-12-30 DIAGNOSIS — E1142 Type 2 diabetes mellitus with diabetic polyneuropathy: Secondary | ICD-10-CM | POA: Diagnosis not present

## 2016-01-03 ENCOUNTER — Encounter (HOSPITAL_COMMUNITY): Payer: Self-pay

## 2016-01-03 ENCOUNTER — Observation Stay (HOSPITAL_COMMUNITY)
Admission: AD | Admit: 2016-01-03 | Discharge: 2016-01-05 | Disposition: A | Discharge status: Home / Self Care | Payer: PPO | Source: Ambulatory Visit | Attending: Internal Medicine | Admitting: Internal Medicine

## 2016-01-03 ENCOUNTER — Observation Stay (HOSPITAL_COMMUNITY): Payer: PPO

## 2016-01-03 DIAGNOSIS — K149 Disease of tongue, unspecified: Secondary | ICD-10-CM | POA: Insufficient documentation

## 2016-01-03 DIAGNOSIS — I700 Atherosclerosis of aorta: Secondary | ICD-10-CM | POA: Insufficient documentation

## 2016-01-03 DIAGNOSIS — M5136 Other intervertebral disc degeneration, lumbar region: Secondary | ICD-10-CM | POA: Insufficient documentation

## 2016-01-03 DIAGNOSIS — N281 Cyst of kidney, acquired: Secondary | ICD-10-CM | POA: Insufficient documentation

## 2016-01-03 DIAGNOSIS — K838 Other specified diseases of biliary tract: Secondary | ICD-10-CM | POA: Diagnosis not present

## 2016-01-03 DIAGNOSIS — E1122 Type 2 diabetes mellitus with diabetic chronic kidney disease: Secondary | ICD-10-CM | POA: Insufficient documentation

## 2016-01-03 DIAGNOSIS — K5710 Diverticulosis of small intestine without perforation or abscess without bleeding: Secondary | ICD-10-CM | POA: Insufficient documentation

## 2016-01-03 DIAGNOSIS — IMO0001 Reserved for inherently not codable concepts without codable children: Secondary | ICD-10-CM | POA: Diagnosis present

## 2016-01-03 DIAGNOSIS — F319 Bipolar disorder, unspecified: Secondary | ICD-10-CM | POA: Diagnosis not present

## 2016-01-03 DIAGNOSIS — Z79899 Other long term (current) drug therapy: Secondary | ICD-10-CM | POA: Insufficient documentation

## 2016-01-03 DIAGNOSIS — Z8249 Family history of ischemic heart disease and other diseases of the circulatory system: Secondary | ICD-10-CM | POA: Insufficient documentation

## 2016-01-03 DIAGNOSIS — N179 Acute kidney failure, unspecified: Secondary | ICD-10-CM | POA: Insufficient documentation

## 2016-01-03 DIAGNOSIS — H3530 Unspecified macular degeneration: Secondary | ICD-10-CM | POA: Diagnosis not present

## 2016-01-03 DIAGNOSIS — Z6824 Body mass index (BMI) 24.0-24.9, adult: Secondary | ICD-10-CM | POA: Insufficient documentation

## 2016-01-03 DIAGNOSIS — I5032 Chronic diastolic (congestive) heart failure: Secondary | ICD-10-CM | POA: Diagnosis present

## 2016-01-03 DIAGNOSIS — I493 Ventricular premature depolarization: Secondary | ICD-10-CM | POA: Insufficient documentation

## 2016-01-03 DIAGNOSIS — E1165 Type 2 diabetes mellitus with hyperglycemia: Secondary | ICD-10-CM | POA: Diagnosis not present

## 2016-01-03 DIAGNOSIS — G8929 Other chronic pain: Secondary | ICD-10-CM | POA: Diagnosis not present

## 2016-01-03 DIAGNOSIS — Z7982 Long term (current) use of aspirin: Secondary | ICD-10-CM | POA: Insufficient documentation

## 2016-01-03 DIAGNOSIS — E669 Obesity, unspecified: Secondary | ICD-10-CM | POA: Insufficient documentation

## 2016-01-03 DIAGNOSIS — D510 Vitamin B12 deficiency anemia due to intrinsic factor deficiency: Secondary | ICD-10-CM | POA: Insufficient documentation

## 2016-01-03 DIAGNOSIS — F329 Major depressive disorder, single episode, unspecified: Secondary | ICD-10-CM | POA: Insufficient documentation

## 2016-01-03 DIAGNOSIS — Z794 Long term (current) use of insulin: Secondary | ICD-10-CM | POA: Insufficient documentation

## 2016-01-03 DIAGNOSIS — E785 Hyperlipidemia, unspecified: Secondary | ICD-10-CM | POA: Diagnosis present

## 2016-01-03 DIAGNOSIS — E876 Hypokalemia: Secondary | ICD-10-CM | POA: Diagnosis not present

## 2016-01-03 DIAGNOSIS — Z885 Allergy status to narcotic agent status: Secondary | ICD-10-CM | POA: Insufficient documentation

## 2016-01-03 DIAGNOSIS — R3129 Other microscopic hematuria: Secondary | ICD-10-CM | POA: Insufficient documentation

## 2016-01-03 DIAGNOSIS — I509 Heart failure, unspecified: Secondary | ICD-10-CM

## 2016-01-03 DIAGNOSIS — N183 Chronic kidney disease, stage 3 unspecified: Secondary | ICD-10-CM | POA: Diagnosis present

## 2016-01-03 DIAGNOSIS — I130 Hypertensive heart and chronic kidney disease with heart failure and stage 1 through stage 4 chronic kidney disease, or unspecified chronic kidney disease: Secondary | ICD-10-CM | POA: Diagnosis not present

## 2016-01-03 DIAGNOSIS — I2510 Atherosclerotic heart disease of native coronary artery without angina pectoris: Secondary | ICD-10-CM | POA: Diagnosis not present

## 2016-01-03 DIAGNOSIS — R109 Unspecified abdominal pain: Secondary | ICD-10-CM | POA: Diagnosis not present

## 2016-01-03 DIAGNOSIS — R1013 Epigastric pain: Secondary | ICD-10-CM | POA: Diagnosis not present

## 2016-01-03 DIAGNOSIS — R112 Nausea with vomiting, unspecified: Secondary | ICD-10-CM | POA: Diagnosis not present

## 2016-01-03 DIAGNOSIS — Z96642 Presence of left artificial hip joint: Secondary | ICD-10-CM | POA: Insufficient documentation

## 2016-01-03 DIAGNOSIS — Z9049 Acquired absence of other specified parts of digestive tract: Secondary | ICD-10-CM | POA: Diagnosis not present

## 2016-01-03 DIAGNOSIS — K219 Gastro-esophageal reflux disease without esophagitis: Secondary | ICD-10-CM | POA: Insufficient documentation

## 2016-01-03 DIAGNOSIS — R110 Nausea: Secondary | ICD-10-CM | POA: Diagnosis present

## 2016-01-03 DIAGNOSIS — E118 Type 2 diabetes mellitus with unspecified complications: Secondary | ICD-10-CM

## 2016-01-03 DIAGNOSIS — M8580 Other specified disorders of bone density and structure, unspecified site: Secondary | ICD-10-CM | POA: Insufficient documentation

## 2016-01-03 DIAGNOSIS — K831 Obstruction of bile duct: Secondary | ICD-10-CM | POA: Insufficient documentation

## 2016-01-03 DIAGNOSIS — R32 Unspecified urinary incontinence: Secondary | ICD-10-CM | POA: Insufficient documentation

## 2016-01-03 DIAGNOSIS — F32A Depression, unspecified: Secondary | ICD-10-CM | POA: Diagnosis present

## 2016-01-03 DIAGNOSIS — K869 Disease of pancreas, unspecified: Secondary | ICD-10-CM | POA: Insufficient documentation

## 2016-01-03 DIAGNOSIS — Z8261 Family history of arthritis: Secondary | ICD-10-CM | POA: Insufficient documentation

## 2016-01-03 LAB — LIPASE, BLOOD: LIPASE: 31 U/L (ref 11–51)

## 2016-01-03 LAB — CBC WITH DIFFERENTIAL/PLATELET
BASOS ABS: 0 10*3/uL (ref 0.0–0.1)
BASOS PCT: 0 %
EOS ABS: 0.1 10*3/uL (ref 0.0–0.7)
EOS PCT: 1 %
HEMATOCRIT: 39 % (ref 36.0–46.0)
Hemoglobin: 12.6 g/dL (ref 12.0–15.0)
Lymphocytes Relative: 33 %
Lymphs Abs: 2.9 10*3/uL (ref 0.7–4.0)
MCH: 27.9 pg (ref 26.0–34.0)
MCHC: 32.3 g/dL (ref 30.0–36.0)
MCV: 86.5 fL (ref 78.0–100.0)
MONO ABS: 0.8 10*3/uL (ref 0.1–1.0)
MONOS PCT: 8 %
Neutro Abs: 5.2 10*3/uL (ref 1.7–7.7)
Neutrophils Relative %: 58 %
PLATELETS: 552 10*3/uL — AB (ref 150–400)
RBC: 4.51 MIL/uL (ref 3.87–5.11)
RDW: 14.1 % (ref 11.5–15.5)
WBC: 9 10*3/uL (ref 4.0–10.5)

## 2016-01-03 LAB — COMPREHENSIVE METABOLIC PANEL
ALBUMIN: 3.6 g/dL (ref 3.5–5.0)
ALT: 17 U/L (ref 14–54)
ANION GAP: 8 (ref 5–15)
AST: 23 U/L (ref 15–41)
Alkaline Phosphatase: 104 U/L (ref 38–126)
BILIRUBIN TOTAL: 0.5 mg/dL (ref 0.3–1.2)
BUN: 8 mg/dL (ref 6–20)
CHLORIDE: 105 mmol/L (ref 101–111)
CO2: 29 mmol/L (ref 22–32)
Calcium: 9.7 mg/dL (ref 8.9–10.3)
Creatinine, Ser: 1.17 mg/dL — ABNORMAL HIGH (ref 0.44–1.00)
GFR calc Af Amer: 48 mL/min — ABNORMAL LOW (ref >60)
GFR calc non Af Amer: 42 mL/min — ABNORMAL LOW (ref >60)
GLUCOSE: 168 mg/dL — AB (ref 65–99)
Potassium: 3.8 mmol/L (ref 3.5–5.1)
Sodium: 142 mmol/L (ref 135–145)
TOTAL PROTEIN: 7.4 g/dL (ref 6.5–8.1)

## 2016-01-03 LAB — GLUCOSE, CAPILLARY
Glucose-Capillary: 97 mg/dL (ref 65–99)
Glucose-Capillary: 77 mg/dL (ref 65–99)

## 2016-01-03 MED ORDER — ENOXAPARIN SODIUM 40 MG/0.4ML ~~LOC~~ SOLN
40.0000 mg | SUBCUTANEOUS | Status: DC
  Administered 2016-01-03 – 2016-01-04 (×2): 40 mg via SUBCUTANEOUS
  Filled 2016-01-03 (×2): qty 0.4

## 2016-01-03 MED ORDER — MIRTAZAPINE 30 MG PO TABS
30.0000 mg | ORAL_TABLET | Freq: Every day | ORAL | Status: DC
  Administered 2016-01-03 – 2016-01-04 (×2): 30 mg via ORAL
  Filled 2016-01-03 (×2): qty 1

## 2016-01-03 MED ORDER — INSULIN ASPART 100 UNIT/ML ~~LOC~~ SOLN
0.0000 [IU] | SUBCUTANEOUS | Status: DC
  Administered 2016-01-03: 3 [IU] via SUBCUTANEOUS
  Administered 2016-01-04: 2 [IU] via SUBCUTANEOUS
  Administered 2016-01-04: 3 [IU] via SUBCUTANEOUS
  Administered 2016-01-05 (×2): 1 [IU] via SUBCUTANEOUS

## 2016-01-03 MED ORDER — PANTOPRAZOLE SODIUM 40 MG PO TBEC
40.0000 mg | DELAYED_RELEASE_TABLET | Freq: Every day | ORAL | Status: DC
  Administered 2016-01-04 – 2016-01-05 (×2): 40 mg via ORAL
  Filled 2016-01-03 (×2): qty 1

## 2016-01-03 MED ORDER — OXYBUTYNIN CHLORIDE 5 MG PO TABS
5.0000 mg | ORAL_TABLET | Freq: Two times a day (BID) | ORAL | Status: DC
  Administered 2016-01-03 – 2016-01-05 (×4): 5 mg via ORAL
  Filled 2016-01-03 (×4): qty 1

## 2016-01-03 MED ORDER — ACETAMINOPHEN 325 MG PO TABS: 650.0000 mg | ORAL_TABLET | Freq: Four times a day (QID) | ORAL | Status: DC | PRN

## 2016-01-03 MED ORDER — POTASSIUM CHLORIDE IN NACL 20-0.9 MEQ/L-% IV SOLN
INTRAVENOUS | Status: DC
  Administered 2016-01-03 – 2016-01-04 (×3): via INTRAVENOUS
  Filled 2016-01-03 (×3): qty 1000

## 2016-01-03 MED ORDER — POTASSIUM CHLORIDE 10 MEQ/100ML IV SOLN
10.0000 meq | INTRAVENOUS | Status: AC
  Administered 2016-01-03 (×4): 10 meq via INTRAVENOUS
  Filled 2016-01-03 (×4): qty 100

## 2016-01-03 MED ORDER — ACETAMINOPHEN 650 MG RE SUPP: 650.0000 mg | Freq: Four times a day (QID) | RECTAL | Status: DC | PRN

## 2016-01-03 MED ORDER — ONDANSETRON HCL 4 MG/2ML IJ SOLN
4.0000 mg | Freq: Four times a day (QID) | INTRAMUSCULAR | Status: DC | PRN
  Administered 2016-01-03: 4 mg via INTRAVENOUS
  Filled 2016-01-03: qty 2

## 2016-01-03 MED ORDER — METOPROLOL TARTRATE 50 MG PO TABS
50.0000 mg | ORAL_TABLET | Freq: Two times a day (BID) | ORAL | Status: DC
  Administered 2016-01-03 – 2016-01-05 (×4): 50 mg via ORAL
  Filled 2016-01-03 (×6): qty 1

## 2016-01-03 MED ORDER — AMLODIPINE BESYLATE 5 MG PO TABS
5.0000 mg | ORAL_TABLET | Freq: Every day | ORAL | Status: DC
  Administered 2016-01-04 – 2016-01-05 (×2): 5 mg via ORAL
  Filled 2016-01-03 (×2): qty 1

## 2016-01-03 MED ORDER — LISINOPRIL 20 MG PO TABS
20.0000 mg | ORAL_TABLET | Freq: Every evening | ORAL | Status: DC
  Administered 2016-01-03 – 2016-01-04 (×2): 20 mg via ORAL
  Filled 2016-01-03 (×2): qty 1

## 2016-01-03 MED ORDER — PREGABALIN 75 MG PO CAPS
75.0000 mg | ORAL_CAPSULE | Freq: Two times a day (BID) | ORAL | Status: DC
  Administered 2016-01-03 – 2016-01-05 (×4): 75 mg via ORAL
  Filled 2016-01-03 (×4): qty 1

## 2016-01-03 MED ORDER — IOPAMIDOL (ISOVUE-300) INJECTION 61%
INTRAVENOUS | Status: AC
  Administered 2016-01-03: 100 mL
  Filled 2016-01-03: qty 100

## 2016-01-03 MED ORDER — VILAZODONE HCL 20 MG PO TABS
20.0000 mg | ORAL_TABLET | Freq: Every day | ORAL | Status: DC
  Administered 2016-01-04 – 2016-01-05 (×2): 20 mg via ORAL
  Filled 2016-01-03 (×2): qty 1

## 2016-01-03 MED ORDER — SODIUM CHLORIDE 0.9 % IV SOLN: INTRAVENOUS | Status: DC

## 2016-01-03 MED ORDER — LITHIUM CARBONATE 150 MG PO CAPS
150.0000 mg | ORAL_CAPSULE | Freq: Every day | ORAL | Status: DC
  Administered 2016-01-04 – 2016-01-05 (×2): 150 mg via ORAL
  Filled 2016-01-03 (×2): qty 1

## 2016-01-03 MED ORDER — PROMETHAZINE HCL 25 MG/ML IJ SOLN: 6.2500 mg | Freq: Four times a day (QID) | INTRAMUSCULAR | Status: DC | PRN

## 2016-01-03 MED ORDER — WHITE PETROLATUM GEL
Status: AC
  Administered 2016-01-03: 14:00:00
  Filled 2016-01-03: qty 1

## 2016-01-03 MED ORDER — MORPHINE SULFATE (PF) 2 MG/ML IV SOLN
1.0000 mg | INTRAVENOUS | Status: DC | PRN
  Administered 2016-01-03 (×2): 2 mg via INTRAVENOUS
  Filled 2016-01-03 (×2): qty 1

## 2016-01-03 MED ORDER — ZOLPIDEM TARTRATE 5 MG PO TABS
2.5000 mg | ORAL_TABLET | Freq: Every day | ORAL | Status: DC
  Administered 2016-01-03 – 2016-01-04 (×2): 2.5 mg via ORAL
  Filled 2016-01-03 (×2): qty 1

## 2016-01-03 MED ORDER — IOPAMIDOL (ISOVUE-300) INJECTION 61%
INTRAVENOUS | Status: AC
  Filled 2016-01-03: qty 30

## 2016-01-03 MED ORDER — ASPIRIN EC 81 MG PO TBEC
81.0000 mg | DELAYED_RELEASE_TABLET | Freq: Every day | ORAL | Status: DC
  Administered 2016-01-04 – 2016-01-05 (×2): 81 mg via ORAL
  Filled 2016-01-03 (×3): qty 1

## 2016-01-03 NOTE — Care Management Note (Addendum)
Case Management Note  Patient Details  Name: [name] MRN: [id number] Date of Birth: [birth year]  Subjective/Objective:                 Patient in obs from home with abd pain, from MD office, likely require ERCP. Lives with husband, has walker, and shower chair. [name]- friend who comes and helps with bathing.  [place] 10/20 with bacteremia on PO abx course now completed.  Spoke with patient and daughter in the room. Patient not active with [facility] but has used [facility] in the past, and would like to use them if needed. Daughters are supportive and assist with driving.  PCP [facility]/ or mailorder   Action/Plan:  CM will continue to monitor for DC needs.  Addendum 01/05/16 No Needs identified at time of DC. DC to home with family   Expected Discharge Date:                  Expected Discharge Plan:  Home/Self Care  In-House Referral:     Discharge planning Services  CM Consult  Post Acute Care Choice:    Choice offered to:     DME Arranged:    DME Agency:     HH Arranged:    HH Agency:     Status of Service:  In process, will continue to follow  If discussed at Long Length of Stay Meetings, dates discussed:    Additional Comments:  Carles Collet, RN 01/03/2016, 2:42 PM

## 2016-01-03 NOTE — H&P (Signed)
History and Physical    [name] [id number] DOB: 09-27-1929 DOA: 01/03/2016   PCP: Irven Shelling, MD   Patient coming from/Resides with: Private residence  Admission status: Observation/medical floor -we'll need to reevaluate in the next 24 hours to determine if it will be medically necessary to stay a minimum 2 midnights to rule out impending and/or unexpected changes in physiologic status that may differ from initial evaluation performed in the ER and/or at time of admission. Presents with progressive abdominal pain and known significant, bile duct dilatation concerning for possible retained common bile duct stone. Evaluated by GI as outpatient with request to admit to hospital for further evaluation to determine if ERCP is indicated. For the first 24 hours patient will require IV fluids, NPO status and IV pain medication/anti-emetics. Labs pending at time of admission  Chief Complaint: Progressive abdominal pain  HPI: [name] is a 82 y.o. female with medical history significant for chronic diastolic congestive heart failure, hypertension, stage III chronic kidney disease, known pulmonary lesion or mass, type 2 diabetes, depression, dyslipidemia, and obesity. Patient was recently discharged on 10/20 after an admission for sepsis secondary to Klebsiella pneumonia bacteremia. This was associated with abdominal pain. Patient subsequent underwent uneventful laparoscopic cholecystectomy on 10/18 with intraoperative cholangiogram revealing markedly dilated common bile duct. Patient was initially treated with IV Zosyn for the bacteremia and was discharged on Ceftin which she just completed. Patient reports that she never stopped having abdominal pain and over the past 2 nights it has been worse and she describes it "unbearable". She has not had any nausea or vomiting although she has been reporting some loose stools. She is had subjective chills without fever. She was a direct admission to  the medical floor.   Review of Systems:  In addition to the HPI above,  No Fever, myalgias or other constitutional symptoms No Headache, changes with Vision or hearing, new weakness, tingling, numbness in any extremity, dizziness, dysarthria or word finding difficulty, gait disturbance or imbalance, tremors or seizure activity No problems swallowing food or Liquids, indigestion/reflux, choking or coughing while eating No Chest pain, Cough or Shortness of Breath, palpitations, orthopnea or DOE No N/V, melena,hematochezia, dark tarry stools, constipation No dysuria, malodorous urine, hematuria or flank pain No new skin rashes, lesions, masses or bruises, No new joint pains, aches, swelling or redness No recent unintentional weight gain or loss No polyuria, polydypsia or polyphagia   Past Medical History:  Diagnosis Date  . Chest pain    a. 07/2014 Neg MV, nl EF.  Marland Kitchen Chronic interstitial cystitis   . Chronic low back pain   . CKD (chronic kidney disease), stage III   . Diabetes mellitus without complication ([place])   . GERD (gastroesophageal reflux disease)   . Hyperlipidemia   . Hypertension   . Macular degeneration   . Microhematuria   . Osteopenia   . Pernicious anemia   . Postlaminectomy syndrome   . PVC (premature ventricular contraction)   . Right carpal tunnel syndrome     Past Surgical History:  Procedure Laterality Date  . BACK SURGERY    . CHOLECYSTECTOMY N/A 12/21/2015   Procedure: LAPAROSCOPIC CHOLECYSTECTOMY WITH INTRAOPERATIVE CHOLANGIOGRAM;  Surgeon: Alphonsa Overall, MD;  Location: [place];  Service: General;  Laterality: N/A;  . EUS N/A 02/23/2015   Procedure: ESOPHAGEAL ENDOSCOPIC ULTRASOUND (EUS) RADIAL;  Surgeon: Arta Silence, MD;  Location: [facility];  Service: Endoscopy;  Laterality: N/A;  . JOINT REPLACEMENT Left    hip  and knee    Social History   Social History  . Marital status: Married    Spouse name: N/A  . Number of children: N/A  . Years of  education: N/A   Occupational History  . Not on file.   Social History Main Topics  . Smoking status: Never Smoker  . Smokeless tobacco: Never Used  . Alcohol use No  . Drug use: No  . Sexual activity: Not on file   Other Topics Concern  . Not on file   Social History Narrative  . No narrative on file    Mobility: Rolling walker with home health PT ordered at time of previous discharge Work history: Not obtained   Allergies  Allergen Reactions  . Codeine Other (See Comments)    slurred speech    Family History  Problem Relation Age of Onset  . Arthritis Mother   . Heart attack Father      Prior to Admission medications   Medication Sig Start Date End Date Taking? Authorizing Provider  acetaminophen (TYLENOL) 500 MG tablet Take 500 mg by mouth every 6 (six) hours as needed for mild pain.    Historical Provider, MD  amLODipine (NORVASC) 5 MG tablet Take 1 tablet (5 mg total) by mouth daily. 11/21/14   Ripudeep Krystal Eaton, MD  aspirin EC 81 MG tablet Take 81 mg by mouth daily.    Historical Provider, MD  cefUROXime (CEFTIN) 500 MG tablet Take 1 tablet (500 mg total) by mouth 2 (two) times daily with a meal. 12/23/15   Maryann Mikhail, DO  cholecalciferol (VITAMIN D) 1000 UNITS tablet Take 2,000 Units by mouth daily.     Historical Provider, MD  conjugated estrogens (PREMARIN) vaginal cream Place 1 Applicatorful vaginally daily as needed (burning).     Historical Provider, MD  cyanocobalamin (,VITAMIN B-12,) 1000 MCG/ML injection Inject 1,000 mcg into the muscle every 30 (thirty) days.    Historical Provider, MD  furosemide (LASIX) 20 MG tablet Take 2 tablets (40 mg total) by mouth daily. Patient taking differently: Take 20 mg by mouth daily.  02/08/15   Domenic Polite, MD  glimepiride (AMARYL) 1 MG tablet Take 2 tablets (2 mg total) by mouth every morning. 01/17/15   Charlynne Cousins, MD  lisinopril (PRINIVIL,ZESTRIL) 20 MG tablet Take 1 tablet (20 mg total) by mouth every  evening. 07/22/14   Maryann Mikhail, DO  lithium carbonate 150 MG capsule Take 150 mg by mouth daily.    Historical Provider, MD  metoprolol (LOPRESSOR) 50 MG tablet Take 1 tablet (50 mg total) by mouth 2 (two) times daily. 07/22/14   Maryann Mikhail, DO  mirtazapine (REMERON) 30 MG tablet Take 30 mg by mouth at bedtime.     Historical Provider, MD  nitroGLYCERIN (NITROSTAT) 0.4 MG SL tablet Place 1 tablet (0.4 mg total) under the tongue every 5 (five) minutes as needed for chest pain. 07/22/14   Maryann Mikhail, DO  omeprazole (PRILOSEC) 40 MG capsule Take 40 mg by mouth every evening.     Historical Provider, MD  oxybutynin (DITROPAN) 5 MG tablet Take 5 mg by mouth 2 (two) times daily.     Historical Provider, MD  pregabalin (LYRICA) 75 MG capsule Take 75 mg by mouth 2 (two) times daily.    Historical Provider, MD  traMADol (ULTRAM) 50 MG tablet Take 1 tablet (50 mg total) by mouth 2 (two) times daily as needed (pain). Patient taking differently: Take 50 mg by mouth 2 (two) times  daily.  07/22/14   Maryann Mikhail, DO  Vilazodone HCl 20 MG TABS Take 20 mg by mouth daily. Viibryd    Historical Provider, MD  zolpidem (AMBIEN) 5 MG tablet Take 2.5 mg by mouth at bedtime.     Historical Provider, MD    Physical Exam: Vitals:   01/03/16 1200 01/03/16 1230  BP: (!) 194/62 (!) 164/59  Pulse: 64   Resp: 17   Temp: 98.7 F (37.1 C)   TempSrc: Oral   SpO2: 98%   Weight: 69.9 kg (154 lb)   Height: 5\' 4"  (1.626 m)       Constitutional: NAD, calm, uncomfortable secondary to ongoing abdominal pain Eyes: PERRL, lids and conjunctivae normal ENMT: Mucous membranes are moist. Posterior pharynx clear of any exudate or lesions.Normal dentition.  Neck: normal, supple, no masses, no thyromegaly Respiratory: Scattered fine expiratory crackles (mid fields to bases) Normal respiratory effort. No accessory muscle use.  Cardiovascular: Regular rate and rhythm, no murmurs / rubs / gallops. No extremity edema.  2+ pedal pulses. No carotid bruits.  Abdomen: Focally tender around epigastrium/left upper quadrant, no masses palpated. No hepatosplenomegaly. Bowel sounds positive. Patient developed nausea after palpation of abdomen. Musculoskeletal: no clubbing / cyanosis. No joint deformity upper and lower extremities. Good ROM, no contractures. Normal muscle tone.  Skin: no rashes, lesions, ulcers. No induration Neurologic: CN 2-12 grossly intact. Sensation intact, DTR normal. Strength 5/5 x all 4 extremities.  Psychiatric: Normal judgment and insight. Alert and oriented x 3. Normal mood.    Labs on Admission: I have personally reviewed following labs and imaging studies  CBC: No results for input(s): WBC, NEUTROABS, HGB, HCT, MCV, PLT in the last 168 hours. Basic Metabolic Panel: No results for input(s): NA, K, CL, CO2, GLUCOSE, BUN, CREATININE, CALCIUM, MG, PHOS in the last 168 hours. GFR: Estimated Creatinine Clearance: 43.2 mL/min (by C-G formula based on SCr of 0.93 mg/dL). Liver Function Tests: No results for input(s): AST, ALT, ALKPHOS, BILITOT, PROT, ALBUMIN in the last 168 hours. No results for input(s): LIPASE, AMYLASE in the last 168 hours. No results for input(s): AMMONIA in the last 168 hours. Coagulation Profile: No results for input(s): INR, PROTIME in the last 168 hours. Cardiac Enzymes: No results for input(s): CKTOTAL, CKMB, CKMBINDEX, TROPONINI in the last 168 hours. BNP (last 3 results) No results for input(s): PROBNP in the last 8760 hours. HbA1C: No results for input(s): HGBA1C in the last 72 hours. CBG: No results for input(s): GLUCAP in the last 168 hours. Lipid Profile: No results for input(s): CHOL, HDL, LDLCALC, TRIG, CHOLHDL, LDLDIRECT in the last 72 hours. Thyroid Function Tests: No results for input(s): TSH, T4TOTAL, FREET4, T3FREE, THYROIDAB in the last 72 hours. Anemia Panel: No results for input(s): VITAMINB12, FOLATE, FERRITIN, TIBC, IRON, RETICCTPCT in the  last 72 hours. Urine analysis:    Component Value Date/Time   COLORURINE YELLOW 12/18/2015 1830   APPEARANCEUR CLOUDY (A) 12/18/2015 1830   LABSPEC 1.010 12/18/2015 1830   PHURINE 6.0 12/18/2015 1830   GLUCOSEU 500 (A) 12/18/2015 1830   HGBUR NEGATIVE 12/18/2015 1830   [place] 12/18/2015 1830   KETONESUR NEGATIVE 12/18/2015 1830   PROTEINUR NEGATIVE 12/18/2015 1830   UROBILINOGEN 0.2 11/20/2014 1405   NITRITE NEGATIVE 12/18/2015 1830   LEUKOCYTESUR NEGATIVE 12/18/2015 1830   Sepsis Labs: @LABRCNTIP (procalcitonin:4,lacticidven:4) )No results found for this or any previous visit (from the past 240 hour(s)).   Radiological Exams on Admission: No results found.   Assessment/Plan Principal Problem:  Dilated cbd, acquired/Abdominal pain, epigastric/nausea without vomiting -Presents with progression and abdominal pain with known dilated common bile duct on intraoperative cholangiogram on 10/18 with concerns about possible retained stone -Dr. 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 but also with well-developed peripancreatic collaterals  Active Problems:   Uncontrolled hypertension -Blood pressure uncontrolled in setting of pain -The patient can tolerate allow oral antihypertensive medications with sip of water    Chronic diastolic congestive heart failure  -Appears compensated based on clinical exam although does have fine expiratory crackles posteriorly so will check chest x-ray **Demonstrated bronchitic changes with lingular  scarring -Not hypoxic -Holding Lasix for now     Type 2 diabetes mellitus with hyperglycemia  -Follow CBGs and provide SS I -Hold preadmission Amaryl -Hemoglobin A1c last checked at this facility in 2016 was mildly elevated at 7.9 -Hemoglobin A1c in a.m.    CKD (chronic kidney disease), stage III -Renal function with slight elevation in creatinine 1.17 up from discharge creatinine 0.93 -Gentle IV fluids as noted-platelets have increased from 181,000 552,000 which appears consistent with heme concentration and volume depletion    Mild acute hypokalemia -Add potassium to maintenance fluids while NPO -KCL 4 runs -Follow chemistries    Hyperlipidemia -Not on a statin prior to admission    Obesity (BMI 35.0-39.9 without comorbidity)     Depression -Continue lithium, Vilazadone and Remeron      DVT prophylaxis: Lovenox  Code Status: Full  Family Communication: Daughter at bedside Disposition Plan: Anticipate discharge back to preadmission home environment once medically stable Consults called: Gastroenterology/Outlaw     Samella Parr ANP-BC [facility] Pager [phone]   If 7PM-7AM, please contact night-coverage [email or web address] Password [email or web address]  01/03/2016, 1:47 PM

## 2016-01-03 NOTE — Progress Notes (Signed)
[name] [id number] Admission Data: 01/03/2016 4:30 PM Attending Provider: Waldemar Dickens, MD  [id number] Broadus John, MD Consults/ Treatment Team:   [name] is a 82 y.o. female patient admitted from ED awake, alert  & orientated  X 3,  Full Code, VSS - Blood pressure (!) 172/52, pulse 68, temperature 98.7 F (37.1 C), temperature source Oral, resp. rate 17, height 5\' 4"  (1.626 m), weight 69.9 kg (154 lb), SpO2 98 %., room air,  no c/o shortness of breath, no c/o chest pain, no distress noted. No tele orders.   IV team called to place Iv.  Allergies:   Allergies  Allergen Reactions  . Codeine Other (See Comments)    slurred speech     Past Medical History:  Diagnosis Date  . Chest pain    a. 07/2014 Neg MV, nl EF.  Marland Kitchen Chronic interstitial cystitis   . Chronic low back pain   . CKD (chronic kidney disease), stage III   . Diabetes mellitus without complication ([place])   . GERD (gastroesophageal reflux disease)   . Hyperlipidemia   . Hypertension   . Macular degeneration   . Microhematuria   . Osteopenia   . Pernicious anemia   . Postlaminectomy syndrome   . PVC (premature ventricular contraction)   . Right carpal tunnel syndrome      Pt orientation to unit, room and routine.  Admission INP armband ID verified with patient/family, and in place. SR up x 2, fall risk assessment complete with Patient and family verbalizing understanding of risks associated with falls. Pt verbalizes an understanding of how to use the call bell and to call for help before getting out of bed.  Skin, clean-dry- skin tears on bilateral shins covered with foam dressing. Old abdominal incisions also present.    Will cont to monitor and assist as needed.  Wyonia Hough, RN 01/03/2016 4:30 PM

## 2016-01-04 ENCOUNTER — Encounter (HOSPITAL_COMMUNITY): Payer: Self-pay | Admitting: Gastroenterology

## 2016-01-04 DIAGNOSIS — R109 Unspecified abdominal pain: Secondary | ICD-10-CM | POA: Diagnosis not present

## 2016-01-04 DIAGNOSIS — K838 Other specified diseases of biliary tract: Secondary | ICD-10-CM | POA: Diagnosis not present

## 2016-01-04 DIAGNOSIS — I5032 Chronic diastolic (congestive) heart failure: Secondary | ICD-10-CM

## 2016-01-04 DIAGNOSIS — N179 Acute kidney failure, unspecified: Secondary | ICD-10-CM

## 2016-01-04 DIAGNOSIS — E669 Obesity, unspecified: Secondary | ICD-10-CM | POA: Diagnosis not present

## 2016-01-04 DIAGNOSIS — R1010 Upper abdominal pain, unspecified: Secondary | ICD-10-CM | POA: Diagnosis not present

## 2016-01-04 DIAGNOSIS — R1013 Epigastric pain: Secondary | ICD-10-CM | POA: Diagnosis not present

## 2016-01-04 LAB — CBC
HEMATOCRIT: 34.3 % — AB (ref 36.0–46.0)
HEMOGLOBIN: 10.8 g/dL — AB (ref 12.0–15.0)
MCH: 27.3 pg (ref 26.0–34.0)
MCHC: 31.5 g/dL (ref 30.0–36.0)
MCV: 86.6 fL (ref 78.0–100.0)
Platelets: 376 10*3/uL (ref 150–400)
RBC: 3.96 MIL/uL (ref 3.87–5.11)
RDW: 14.2 % (ref 11.5–15.5)
WBC: 5.6 10*3/uL (ref 4.0–10.5)

## 2016-01-04 LAB — COMPREHENSIVE METABOLIC PANEL
ALT: 12 U/L — ABNORMAL LOW (ref 14–54)
AST: 17 U/L (ref 15–41)
Albumin: 2.8 g/dL — ABNORMAL LOW (ref 3.5–5.0)
Alkaline Phosphatase: 81 U/L (ref 38–126)
Anion gap: 6 (ref 5–15)
BUN: <5 mg/dL — ABNORMAL LOW (ref 6–20)
CHLORIDE: 111 mmol/L (ref 101–111)
CO2: 26 mmol/L (ref 22–32)
Calcium: 8.8 mg/dL — ABNORMAL LOW (ref 8.9–10.3)
Creatinine, Ser: 0.94 mg/dL (ref 0.44–1.00)
GFR, EST NON AFRICAN AMERICAN: 54 mL/min — AB (ref >60)
Glucose, Bld: 116 mg/dL — ABNORMAL HIGH (ref 65–99)
POTASSIUM: 4.4 mmol/L (ref 3.5–5.1)
SODIUM: 143 mmol/L (ref 135–145)
Total Bilirubin: 0.6 mg/dL (ref 0.3–1.2)
Total Protein: 5.6 g/dL — ABNORMAL LOW (ref 6.5–8.1)

## 2016-01-04 LAB — GLUCOSE, CAPILLARY
GLUCOSE-CAPILLARY: 101 mg/dL — AB (ref 65–99)
GLUCOSE-CAPILLARY: 109 mg/dL — AB (ref 65–99)
GLUCOSE-CAPILLARY: 135 mg/dL — AB (ref 65–99)
GLUCOSE-CAPILLARY: 66 mg/dL (ref 65–99)
GLUCOSE-CAPILLARY: 82 mg/dL (ref 65–99)
Glucose-Capillary: 226 mg/dL — ABNORMAL HIGH (ref 65–99)
Glucose-Capillary: 176 mg/dL — ABNORMAL HIGH (ref 65–99)

## 2016-01-04 MED ORDER — DEXTROSE 50 % IV SOLN
INTRAVENOUS | Status: AC
  Administered 2016-01-04: 25 mL
  Filled 2016-01-04: qty 50

## 2016-01-04 NOTE — Progress Notes (Signed)
   01/04/16 0452  Vitals  Temp 98.2 F (36.8 C)  Temp Source Oral  BP (!) 170/49  MAP (mmHg) 81  BP Location Right Arm  BP Method Automatic  Patient Position (if appropriate) Lying  Pulse Rate (!) 52  Pulse Rate Source Monitor  Resp 16  Oxygen Therapy  SpO2 96 %   NP K.Schorr made aware

## 2016-01-04 NOTE — Progress Notes (Signed)
PROGRESS NOTE        PATIENT DETAILS Name: [name] Age: 82 y.o. Sex: female Date of Birth: 1929/08/03 Admit Date: 01/03/2016 Admitting Physician Waldemar Dickens, MD IA:[id number] Broadus John, MD  Brief Narrative: Patient is a 82 y.o. female with a recent history of gram-negative bacteremia in the setting of chronic cholecystitis -status post laparoscopic cholecystectomy- with intra-operative cholangiogram showing a dilated CBD-admitted for evaluation of abdominal pain  Subjective: Abd pain is better  Assessment/Plan: Abdominal pain:? Biliary etiology-however LFTs are completely normal. CT of the abdomen with no acute abnormality. Abdominal pain has improved, diet is being advanced-case discussed with GI M.D., who recommends that if abdominal pain continues to improve, we can discharge patient on 11/2 with plans to follow further workup as an outpatient.  Principal Problem: Dilated CBD: GI M.D. contemplating outpatient ERCP or biliary manometry  Hypertension: BP better controlled controlled-continue amlodipine, metoprolol and lisinopril. Will discontinue IV fluids-follow BP trend  Type II DM: CBGs stable with SSI, resume Amaryl on discharge  Acute kidney injury: Likely mild prerenal azotemia-creatinine has normalized with IV fluids.  Chronic diastolic heart failure: Stop IV fluids-follow weights  GERD: Continue PPI  Depression/bipolar disorder: Appears stable-continue Vilazodone, lithium and Remeron  Obesity: Counseled regarding importance of weight loss   DVT Prophylaxis: Prophylactic Lovenox or Heparin or SCD's Full dose anticoagulation with Heparin/Lovenox/Coumadin/Eliquis/Xarelto/Pradaxa  Code Status: Full code   Family Communication: None at bedside  Disposition Plan: Remain inpatient-home tomorrow if abd pain better  Antimicrobial agents: None  Procedures: None  CONSULTS:  GI  Time spent: 25- minutes-Greater than 50% of this  time was spent in counseling, explanation of diagnosis, planning of further management, and coordination of care.  MEDICATIONS: Anti-infectives    None      Scheduled Meds: . amLODipine  5 mg Oral Daily  . aspirin EC  81 mg Oral Daily  . enoxaparin (LOVENOX) injection  40 mg Subcutaneous Q24H  . insulin aspart  0-9 Units Subcutaneous Q4H  . lisinopril  20 mg Oral QPM  . lithium carbonate  150 mg Oral Daily  . metoprolol  50 mg Oral BID  . mirtazapine  30 mg Oral QHS  . oxybutynin  5 mg Oral BID  . pantoprazole  40 mg Oral Daily  . pregabalin  75 mg Oral BID  . Vilazodone HCl  20 mg Oral Daily  . zolpidem  2.5 mg Oral QHS   Continuous Infusions: . 0.9 % NaCl with KCl 20 mEq / L 75 mL/hr at 01/04/16 0937   PRN Meds:.acetaminophen **OR** acetaminophen, morphine injection, ondansetron (ZOFRAN) IV **OR** promethazine   PHYSICAL EXAM: Vital signs: Vitals:   01/04/16 0452 01/04/16 0505 01/04/16 0929 01/04/16 1438  BP: (!) 170/49  (!) 178/48 (!) 136/50  Pulse: (!) 52  62 66  Resp: 16     Temp: 98.2 F (36.8 C)   98.5 F (36.9 C)  TempSrc: Oral   Oral  SpO2: 96%   96%  Weight:  62.4 kg (137 lb 9.1 oz)    Height:       Filed Weights   01/03/16 1200 01/04/16 0505  Weight: 69.9 kg (154 lb) 62.4 kg (137 lb 9.1 oz)   Body mass index is 23.61 kg/m.   General appearance :Awake, alert, not in any distress. Speech Clear. Not toxic Looking Eyes:, pupils equally reactive to  light and accomodation,no scleral icterus.Pink conjunctiva HEENT: Atraumatic and Normocephalic Neck: supple, no JVD. No cervical lymphadenopathy. No thyromegaly Resp:Good air entry bilaterally, no added sounds  CVS: S1 S2 regular, no murmurs.  GI: Bowel sounds present, Very minimally tender in the epigastric area.No gaurding, rigidity or rebound.No organomegaly Extremities: B/L Lower Ext shows no edema, both legs are warm to touch Neurology:  speech clear,Non focal, sensation is grossly intact. Psychiatric:  Normal judgment and insight. Alert and oriented x 3. Normal mood. Musculoskeletal:gait appears to be normal.No digital cyanosis Skin:No Rash, warm and dry Wounds:N/A  I have personally reviewed following labs and imaging studies  LABORATORY DATA: CBC:  Recent Labs Lab 01/03/16 1310 01/04/16 0808  WBC 9.0 5.6  NEUTROABS 5.2  --   HGB 12.6 10.8*  HCT 39.0 34.3*  MCV 86.5 86.6  PLT 552* [id number]    Basic Metabolic Panel:  Recent Labs Lab 01/03/16 1310 01/04/16 0808  NA 142 143  K 3.8 4.4  CL 105 111  CO2 29 26  GLUCOSE 168* 116*  BUN 8 <5*  CREATININE 1.17* 0.94  CALCIUM 9.7 8.8*    GFR: Estimated Creatinine Clearance: 38.5 mL/min (by C-G formula based on SCr of 0.94 mg/dL).  Liver Function Tests:  Recent Labs Lab 01/03/16 1310 01/04/16 0808  AST 23 17  ALT 17 12*  ALKPHOS 104 81  BILITOT 0.5 0.6  PROT 7.4 5.6*  ALBUMIN 3.6 2.8*    Recent Labs Lab 01/03/16 1310  LIPASE 31   No results for input(s): AMMONIA in the last 168 hours.  Coagulation Profile: No results for input(s): INR, PROTIME in the last 168 hours.  Cardiac Enzymes: No results for input(s): CKTOTAL, CKMB, CKMBINDEX, TROPONINI in the last 168 hours.  BNP (last 3 results) No results for input(s): PROBNP in the last 8760 hours.  HbA1C: No results for input(s): HGBA1C in the last 72 hours.  CBG:  Recent Labs Lab 01/04/16 0013 01/04/16 0032 01/04/16 0449 01/04/16 0734 01/04/16 1131  GLUCAP 66 135* 82 101* 109*    Lipid Profile: No results for input(s): CHOL, HDL, LDLCALC, TRIG, CHOLHDL, LDLDIRECT in the last 72 hours.  Thyroid Function Tests: No results for input(s): TSH, T4TOTAL, FREET4, T3FREE, THYROIDAB in the last 72 hours.  Anemia Panel: No results for input(s): VITAMINB12, FOLATE, FERRITIN, TIBC, IRON, RETICCTPCT in the last 72 hours.  Urine analysis:    Component Value Date/Time   COLORURINE YELLOW 12/18/2015 1830   APPEARANCEUR CLOUDY (A) 12/18/2015 1830    LABSPEC 1.010 12/18/2015 1830   PHURINE 6.0 12/18/2015 1830   GLUCOSEU 500 (A) 12/18/2015 1830   HGBUR NEGATIVE 12/18/2015 1830   BILIRUBINUR NEGATIVE 12/18/2015 1830   KETONESUR NEGATIVE 12/18/2015 1830   PROTEINUR NEGATIVE 12/18/2015 1830   UROBILINOGEN 0.2 11/20/2014 1405   NITRITE NEGATIVE 12/18/2015 1830   LEUKOCYTESUR NEGATIVE 12/18/2015 1830    Sepsis Labs: Lactic Acid, Venous    Component Value Date/Time   LATICACIDVEN 2.4 ([place]) 12/19/2015 0354    MICROBIOLOGY: No results found for this or any previous visit (from the past 240 hour(s)).  RADIOLOGY STUDIES/RESULTS: Dg Chest 2 View  Result Date: 01/03/2016 CLINICAL DATA:  Chronic heart failure, hypertension, diabetes mellitus, abdominal and epigastric pain after gallbladder resection 2 weeks ago EXAM: CHEST  2 VIEW COMPARISON:  12/18/2015 FINDINGS: Upper normal size of cardiac silhouette with slight pulmonary vascular congestion. Atherosclerotic calcification aorta. Scarring LEFT mid lung at lingula, stable. Mild central peribronchial thickening. No infiltrate, pleural effusion or pneumothorax. Diffuse osseous  demineralization with BILATERAL glenohumeral degenerative changes. IMPRESSION: Bronchitic changes with lingular scarring. No acute abnormalities. Aortic atherosclerosis. Electronically Signed   By: Lavonia Dana M.D.   On: 01/03/2016 15:31   Dg Cholangiogram Operative  Result Date: 12/21/2015 CLINICAL DATA:  Intraoperative cholangiogram during laparoscopic cholecystectomy. EXAM: INTRAOPERATIVE CHOLANGIOGRAM FLUOROSCOPY TIME:  32 seconds COMPARISON:  Right upper quadrant abdominal ultrasound - 12/20/2015 FINDINGS: Intraoperative cholangiographic images of the right upper abdominal quadrant during laparoscopic cholecystectomy are provided for review. Surgical clips overlie the expected location of the gallbladder fossa. Contrast injection demonstrates selective cannulation of the central aspect of the cystic duct. There is  passage of contrast through the central aspect of the cystic duct with filling of a markedly dilated common bile duct. There is passage of contrast though the CBD with very faint opacification of the duodenum. There is minimal reflux of injected contrast into the common hepatic duct and central aspect of a mildly dilated intrahepatic biliary system. There are no discrete filling defects within the opacified portions of the biliary system to suggest the presence of choledocholithiasis. IMPRESSION: 1. Marked dilatation of the common bile duct without evidence of choledocholithiasis. 2. Minimal passage of contrast into the duodenum, again, without discrete intraluminal filling defect. Findings are nonspecific though could be seen in the setting of biliary sphincter spasm/stenosis. Correlation with the operative report is recommended. Further evaluation with ERCP could be performed as indicated. Electronically Signed   By: Sandi Mariscal M.D.   On: 12/21/2015 15:42   Ct Abdomen Pelvis W Contrast  Result Date: 01/03/2016 CLINICAL DATA:  Acute onset of generalized abdominal pain and diarrhea. Initial encounter. EXAM: CT ABDOMEN AND PELVIS WITH CONTRAST TECHNIQUE: Multidetector CT imaging of the abdomen and pelvis was performed using the standard protocol following bolus administration of intravenous contrast. CONTRAST:  100 mL ISOVUE-300 IOPAMIDOL (ISOVUE-300) INJECTION 61% COMPARISON:  CT of the abdomen and pelvis performed 02/03/2015, MRCP performed 12/19/2015, and right upper quadrant ultrasound performed 12/20/2015 FINDINGS: Lower chest: Minimal bibasilar atelectasis or scarring is noted. Diffuse coronary artery calcifications are seen. Hepatobiliary: Intrahepatic biliary ductal dilatation is noted, with dilatation of the common bile duct to 1.8 cm. This is slightly more prominent than on prior pre-cholecystectomy studies. Pancreas: A 2.8 cm cystic lesion at the proximal body of the pancreas appears grossly stable  from 2016, and appeared to reflect a simple cyst on recent MRCP. Spleen: The spleen is unremarkable in appearance. Adrenals/Urinary Tract: The adrenal glands are unremarkable in appearance. There is incomplete rotation of the right kidney. Small left renal cysts are noted. There is no evidence of hydronephrosis. No renal or ureteral stones are identified. Stomach/Bowel: The stomach is unremarkable in appearance. The small bowel is within normal limits. The appendix is not visualized; there is no evidence for appendicitis. Scattered diverticulosis is noted along the sigmoid colon, without evidence of diverticulitis. Contrast progresses to the level of the rectum. Vascular/Lymphatic: Diffuse calcification is seen along the abdominal aorta and its branches. The abdominal aorta is otherwise grossly unremarkable. The inferior vena cava is grossly unremarkable. No retroperitoneal lymphadenopathy is seen. No pelvic sidewall lymphadenopathy is identified. Reproductive: The bladder is mildly distended and within normal limits. The uterus is grossly unremarkable in appearance. The ovaries are relatively symmetric. No suspicious adnexal masses are seen. Evaluation of the pelvis is suboptimal due to metal artifact. Other: No additional soft tissue abnormalities are seen. Musculoskeletal: No acute osseous abnormalities are identified. Multilevel vacuum phenomenon and diffuse sclerosis is noted at the lumbar spine, with  mild underlying facet disease. The patient's left hip arthroplasty is grossly unremarkable, though incompletely assessed. The visualized musculature is unremarkable in appearance. IMPRESSION: 1. No acute abnormality seen to explain the patient's symptoms. 2. Mild diverticulosis along the sigmoid colon, without evidence of diverticulitis. 3. Intrahepatic biliary ductal dilatation, and dilatation of the common bile duct, is only slightly more prominent than on recent prior pre-cholecystectomy studies. 4. Diffuse  coronary artery calcifications seen. 5. Relatively stable 2.8 cm cyst at the proximal body of the pancreas, as evaluated on recent MRCP. 6. Small left renal cysts noted. 7. Diffuse aortic atherosclerosis. 8. Mild degenerative change along the lumbar spine. Electronically Signed   By: Garald Balding M.D.   On: 01/03/2016 20:36   Mr 3d Recon At Scanner  Result Date: 12/19/2015 CLINICAL DATA:  82 year old female presenting with abnormal LFTs. Abdominal pain and nausea and vomiting. History of prior cholangitis. EXAM: MRI ABDOMEN WITHOUT AND WITH CONTRAST (INCLUDING MRCP) TECHNIQUE: Multiplanar multisequence MR imaging of the abdomen was performed both before and after the administration of intravenous contrast. Heavily T2-weighted images of the biliary and pancreatic ducts were obtained, and three-dimensional MRCP images were rendered by post processing. CONTRAST:  16mL MULTIHANCE GADOBENATE DIMEGLUMINE 529 MG/ML IV SOLN COMPARISON:  Abdominal MRI dated 02/05/2015 FINDINGS: Evaluation of this exam is limited due to respiratory motion artifact. Lower chest: No acute findings. Hepatobiliary: The liver appears unremarkable. No abnormal enhancement or focal lesion identified. Mild intrahepatic biliary ductal dilatation. There is dilatation of the extrahepatic biliary tree and common bile duct. The common bile duct measures up to 14 mm proximally and 13 mm in diameter in the head of the pancreas. This findings are similar to the prior MRI. The gallbladder mildly distended. Trace fluid is noted adjacent to the gallbladder as seen on the prior studies. No stone identified within the gallbladder or in the CBD. Ultrasound may provide better evaluation of the gallbladder. No focal stenosis of the biliary tree. There is no significant irregularity or beaded appearance of the bile ducts. No definite abnormal enhancement to suggest cholangitis. No occlusive delivery lesions identified. Pancreas: There is a 2.5 cm simple  appearing in the uncinate process of the pancreas as seen on the prior MRI. The pancreas otherwise appears unremarkable. The bony pancreatic duct measures approximately 4 mm in diameter. Spleen:  Within normal limits in size and appearance. Adrenals/Urinary Tract: The adrenal glands appear unremarkable. Small left renal measuring up to approximately 15 mm. There is mild atrophy of the renal parenchyma. No hydronephrosis. Stomach/Bowel: Visualized portions within the abdomen are unremarkable. Vascular/Lymphatic: No pathologically enlarged lymph nodes identified. No abdominal aortic aneurysm demonstrated. Other:  None Musculoskeletal: There is degenerative changes of the spine. IMPRESSION: Stable appearing dilatation of the biliary tree similar to prior study and without evidence of obstructing stone or lesion. No definite evidence of acute cholangitis by MRI. Evaluation however is limited due to respiratory motion artifact. Correlation with clinical exam recommended. Small pericholecystic fluid as seen on the prior studies. Ultrasound may provide better evaluation of the gallbladder. Stable appearing simple cystic lesion at the head of the pancreas. Electronically Signed   By: Anner Crete M.D.   On: 12/19/2015 04:17   US Abdomen Limited  Result Date: 12/20/2015 CLINICAL DATA:  Abnormal LFTs. EXAM: US ABDOMEN LIMITED - RIGHT UPPER QUADRANT COMPARISON:  MRI 12/19/2015 . FINDINGS: Gallbladder: Mild amount of gallbladder sludge cannot be excluded. Severe thickening of the gallbladder wall to 7 mm noted. Small amount of pericholecystic fluid noted.  Positive ultrasound Murphy's sign noted. Findings suggest cholecystitis. Common bile duct: Diameter: 17 mm. Liver: No focal hepatic abnormality identified. Echotexture normal. Intrahepatic biliary ductal dilatation noted. Similar finding noted on prior MRI of 12/19/2015. IMPRESSION: 1. Mild of mouth of gallbladder sludge cannot be excluded. Severe thickening of the  gallbladder wall up to 7 mm noted. Small amount of pericholecystic fluid noted. Positive ultrasound Murphy sign noted. Findings suggest cholecystitis. 2. Distention of the common bile duct and intrahepatic biliary ducts. Similar finding noted on prior MRI of 12/19/2015. Electronically Signed   By: Marcello Moores  Register   On: 12/20/2015 10:25   Dg Chest Portable 1 View  Result Date: 12/18/2015 CLINICAL DATA:  Chest pain.  Concern for dissection. EXAM: PORTABLE CHEST 1 VIEW COMPARISON:  02/06/2015 FINDINGS: Heart size mildly enlarged. Negative for heart failure. Mild streaky markings in the bases most consistent with scarring or atelectasis. Negative for pneumonia or effusion. Atherosclerotic calcification in the aorta. Mediastinum not widened. IMPRESSION: Mild bibasilar atelectasis/ scarring. Electronically Signed   By: Franchot Gallo M.D.   On: 12/18/2015 18:26   Mr Abdomen Mrcp Moise Boring Contast  Result Date: 12/19/2015 CLINICAL DATA:  82 year old female presenting with abnormal LFTs. Abdominal pain and nausea and vomiting. History of prior cholangitis. EXAM: MRI ABDOMEN WITHOUT AND WITH CONTRAST (INCLUDING MRCP) TECHNIQUE: Multiplanar multisequence MR imaging of the abdomen was performed both before and after the administration of intravenous contrast. Heavily T2-weighted images of the biliary and pancreatic ducts were obtained, and three-dimensional MRCP images were rendered by post processing. CONTRAST:  58mL MULTIHANCE GADOBENATE DIMEGLUMINE 529 MG/ML IV SOLN COMPARISON:  Abdominal MRI dated 02/05/2015 FINDINGS: Evaluation of this exam is limited due to respiratory motion artifact. Lower chest: No acute findings. Hepatobiliary: The liver appears unremarkable. No abnormal enhancement or focal lesion identified. Mild intrahepatic biliary ductal dilatation. There is dilatation of the extrahepatic biliary tree and common bile duct. The common bile duct measures up to 14 mm proximally and 13 mm in diameter in the  head of the pancreas. This findings are similar to the prior MRI. The gallbladder mildly distended. Trace fluid is noted adjacent to the gallbladder as seen on the prior studies. No stone identified within the gallbladder or in the CBD. Ultrasound may provide better evaluation of the gallbladder. No focal stenosis of the biliary tree. There is no significant irregularity or beaded appearance of the bile ducts. No definite abnormal enhancement to suggest cholangitis. No occlusive delivery lesions identified. Pancreas: There is a 2.5 cm simple appearing in the uncinate process of the pancreas as seen on the prior MRI. The pancreas otherwise appears unremarkable. The bony pancreatic duct measures approximately 4 mm in diameter. Spleen:  Within normal limits in size and appearance. Adrenals/Urinary Tract: The adrenal glands appear unremarkable. Small left renal measuring up to approximately 15 mm. There is mild atrophy of the renal parenchyma. No hydronephrosis. Stomach/Bowel: Visualized portions within the abdomen are unremarkable. Vascular/Lymphatic: No pathologically enlarged lymph nodes identified. No abdominal aortic aneurysm demonstrated. Other:  None Musculoskeletal: There is degenerative changes of the spine. IMPRESSION: Stable appearing dilatation of the biliary tree similar to prior study and without evidence of obstructing stone or lesion. No definite evidence of acute cholangitis by MRI. Evaluation however is limited due to respiratory motion artifact. Correlation with clinical exam recommended. Small pericholecystic fluid as seen on the prior studies. Ultrasound may provide better evaluation of the gallbladder. Stable appearing simple cystic lesion at the head of the pancreas. Electronically Signed   By:  Anner Crete M.D.   On: 12/19/2015 04:17   Ct Angio Chest/abd/pel For Dissection W And/or Wo Contrast  Result Date: 12/18/2015 CLINICAL DATA:  Chest pain.  Incontinence.  Concern for dissection.  EXAM: CT ANGIOGRAPHY CHEST, ABDOMEN AND PELVIS TECHNIQUE: Multidetector CT imaging through the chest, abdomen and pelvis was performed using the standard protocol during bolus administration of intravenous contrast. Multiplanar reconstructed images and MIPs were obtained and reviewed to evaluate the vascular anatomy. CONTRAST:  80 cc Isovue 370 intravenous COMPARISON:  MRCP 06/19/2015. FINDINGS: CTA CHEST FINDINGS Cardiovascular: Noncontrast phase shows no intramural hematoma in the aorta. No aortic dissection, aneurysm, or stenosis. Atherosclerosis, including the aorta and coronary arteries. No cardiomegaly. No pericardial effusion. Limited evaluation the pulmonary arteries due to motion. No central filling defect identified. Mediastinum/Nodes: No adenopathy.  Negative esophagus Lungs/Pleura: Motion degraded evaluation. Mild atelectasis. There is no edema, consolidation, effusion, or pneumothorax. Musculoskeletal: See below Review of the MIP images confirms the above findings. CTA ABDOMEN AND PELVIS FINDINGS VASCULAR Aorta: Diffuse atherosclerosis.  No aneurysm or stenosis. Celiac: High-grade narrowing, with narrowing accentuated by median arcuate ligament given the morphology. Mild hypertrophy of peripancreatic collaterals. SMA: Atherosclerosis without stenosis. Renals: Atherosclerosis without suspected flow limiting stenosis. Single bilateral renal arteries. IMA: Patent Inflow: Atheromatous without stenosis or occlusion.  No aneurysm. Veins: No obvious venous abnormality within the limitations of this arterial phase study. Review of the MIP images confirms the above findings. NON-VASCULAR Hepatobiliary: No focal liver abnormality.Chronic dilatation of the intrahepatic and extrahepatic biliary tree without visible mass or choledocholithiasis. The common bile duct measures up to 19 mm. The gallbladder is distended and there is mild pericholecystic fluid, similar to 02/05/2015 MRCP. Pancreas: Chronic prominence of  the main pancreatic duct measuring up to 4-5 mm. Unilocular pancreatic head cyst measuring 24 mm, which had layering secretions on previous MRI. Punctate calcifications seen dependently today. Spleen: Unremarkable. Adrenals/Urinary Tract: Negative adrenals. No hydronephrosis or stone. Simple appearing left renal cyst. The bladder appears thick walled with mild perivesicular stranding. Sensitivity is diminished by streak artifact from left hip prosthesis. Stomach/Bowel: Extensive distal colonic diverticulosis without active inflammation. No bowel obstruction. No appendicitis. Mid duodenal diverticulum wrapping behind the pancreatic head. Lymphatic:   No mass or adenopathy. Reproductive:No pathologic findings. Other: No ascites or pneumoperitoneum. Musculoskeletal: Severe lumbar disc degeneration with extensive sclerosis and endplate irregularity. Remote L1 superior endplate fracture. No acute fracture noted. Total left hip arthroplasty without adverse finding. Review of the MIP images confirms the above findings. IMPRESSION: 1. Negative for aortic dissection or other acute arterial finding. 2. Suspect cystitis.  Correlate with urinalysis. 3. Chronic biliary tree dilatation. Reference MRCP 06/19/2015. There is mild pericholecystic fluid, raising the possibility of cholecystitis, but this appearance was also present on December 2016 MRCP. 4. Unilocular, benign-appearing pancreatic head mass that is unchanged from MRCP. 5. 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 Electronically Signed   By: Monte Fantasia M.D.   On: 12/18/2015 20:42     LOS: 0 days   Oren Binet, MD  [facility] Pager:[phone]  If 7PM-7AM, please contact night-coverage [email or web address] Password [email or web address] 01/04/2016, 4:15 PM

## 2016-01-04 NOTE — Care Management Obs Status (Signed)
[facility] NOTIFICATION   Patient Details  Name: [name] MRN: [id number] Date of Birth: Dec 04, 1929   Medicare Observation Status Notification Given:  Yes    Carles Collet, RN 01/04/2016, 12:32 PM

## 2016-01-04 NOTE — Progress Notes (Signed)
BP 158/42, HR 64, Metoprolol 50 mg scheduled for 2200, MD notified at 2202. Metoprolol given per MD order.

## 2016-01-04 NOTE — Consult Note (Signed)
Reason for Consult: Abdominal pain Referring Physician: Hospital team  [name] is an 82 y.o. female.  HPI: Patient seen and examined and her hospital computer chart reviewed and our office computer chart reviewed and her case discussed with my partner Dr. Paulita Fujita and the medical team and currently she is doing better and she cannot say what brings on her pain nor would gets her better and she's had a complete workup except for either biliary manometry or an per sphincterotomy which is probably the next step however with her liver tests being normal today I think it is okay to let her eat and if liver test still normal tomorrow she can probably go home and decide if outpatient procedure is needed she does tell me she's had her pain off and on for a long time. But it has been worse since her gallbladder has been out  Past Medical History:  Diagnosis Date  . Chest pain    a. 07/2014 Neg MV, nl EF.  Marland Kitchen Chronic interstitial cystitis   . Chronic low back pain   . CKD (chronic kidney disease), stage III   . Diabetes mellitus without complication ([place])   . GERD (gastroesophageal reflux disease)   . Hyperlipidemia   . Hypertension   . Macular degeneration   . Microhematuria   . Osteopenia   . Pernicious anemia   . Postlaminectomy syndrome   . PVC (premature ventricular contraction)   . Right carpal tunnel syndrome     Past Surgical History:  Procedure Laterality Date  . BACK SURGERY    . CHOLECYSTECTOMY N/A 12/21/2015   Procedure: LAPAROSCOPIC CHOLECYSTECTOMY WITH INTRAOPERATIVE CHOLANGIOGRAM;  Surgeon: Alphonsa Overall, MD;  Location: [facility];  Service: General;  Laterality: N/A;  . EUS N/A 02/23/2015   Procedure: ESOPHAGEAL ENDOSCOPIC ULTRASOUND (EUS) RADIAL;  Surgeon: Arta Silence, MD;  Location: [facility];  Service: Endoscopy;  Laterality: N/A;  . JOINT REPLACEMENT Left    hip and knee    Family History  Problem Relation Age of Onset  . Arthritis Mother   . Heart attack Father      Social History:  reports that she has never smoked. She has never used smokeless tobacco. She reports that she does not drink alcohol or use drugs.  Allergies:  Allergies  Allergen Reactions  . Codeine Other (See Comments)    slurred speech    Medications: I have reviewed the patient's current medications.  Results for orders placed or performed during the hospital encounter of 01/03/16 (from the past 48 hour(s))  CBC with Differential/Platelet     Status: Abnormal   Collection Time: 01/03/16  1:10 PM  Result Value Ref Range   WBC 9.0 4.0 - 10.5 K/uL   RBC 4.51 3.87 - 5.11 MIL/uL   Hemoglobin 12.6 12.0 - 15.0 g/dL   HCT 39.0 36.0 - 46.0 %   MCV 86.5 78.0 - 100.0 fL   MCH 27.9 26.0 - 34.0 pg   MCHC 32.3 30.0 - 36.0 g/dL   RDW 14.1 11.5 - 15.5 %   Platelets 552 (H) 150 - 400 K/uL   Neutrophils Relative % 58 %   Neutro Abs 5.2 1.7 - 7.7 K/uL   Lymphocytes Relative 33 %   Lymphs Abs 2.9 0.7 - 4.0 K/uL   Monocytes Relative 8 %   Monocytes Absolute 0.8 0.1 - 1.0 K/uL   Eosinophils Relative 1 %   Eosinophils Absolute 0.1 0.0 - 0.7 K/uL   Basophils Relative 0 %  Basophils Absolute 0.0 0.0 - 0.1 K/uL  Comprehensive metabolic panel     Status: Abnormal   Collection Time: 01/03/16  1:10 PM  Result Value Ref Range   Sodium 142 135 - 145 mmol/L   Potassium 3.8 3.5 - 5.1 mmol/L   Chloride 105 101 - 111 mmol/L   CO2 29 22 - 32 mmol/L   Glucose, Bld 168 (H) 65 - 99 mg/dL   BUN 8 6 - 20 mg/dL   Creatinine, Ser 3.30 (H) 0.44 - 1.00 mg/dL   Calcium 9.7 8.9 - 19.7 mg/dL   Total Protein 7.4 6.5 - 8.1 g/dL   Albumin 3.6 3.5 - 5.0 g/dL   AST 23 15 - 41 U/L   ALT 17 14 - 54 U/L   Alkaline Phosphatase 104 38 - 126 U/L   Total Bilirubin 0.5 0.3 - 1.2 mg/dL   GFR calc non Af Amer 42 (L) >60 mL/min   GFR calc Af Amer 48 (L) >60 mL/min    Comment: (NOTE) The eGFR has been calculated using the CKD EPI equation. This calculation has not been validated in all clinical  situations. eGFR's persistently <60 mL/min signify possible Chronic Kidney Disease.    Anion gap 8 5 - 15  Lipase, blood     Status: None   Collection Time: 01/03/16  1:10 PM  Result Value Ref Range   Lipase 31 11 - 51 U/L  Glucose, capillary     Status: None   Collection Time: 01/03/16  5:01 PM  Result Value Ref Range   Glucose-Capillary 97 65 - 99 mg/dL  Glucose, capillary     Status: None   Collection Time: 01/03/16  9:41 PM  Result Value Ref Range   Glucose-Capillary 77 65 - 99 mg/dL  Glucose, capillary     Status: None   Collection Time: 01/04/16 12:13 AM  Result Value Ref Range   Glucose-Capillary 66 65 - 99 mg/dL  Glucose, capillary     Status: Abnormal   Collection Time: 01/04/16 12:32 AM  Result Value Ref Range   Glucose-Capillary 135 (H) 65 - 99 mg/dL  Glucose, capillary     Status: None   Collection Time: 01/04/16  4:49 AM  Result Value Ref Range   Glucose-Capillary 82 65 - 99 mg/dL  Glucose, capillary     Status: Abnormal   Collection Time: 01/04/16  7:34 AM  Result Value Ref Range   Glucose-Capillary 101 (H) 65 - 99 mg/dL  Comprehensive metabolic panel     Status: Abnormal   Collection Time: 01/04/16  8:08 AM  Result Value Ref Range   Sodium 143 135 - 145 mmol/L   Potassium 4.4 3.5 - 5.1 mmol/L   Chloride 111 101 - 111 mmol/L   CO2 26 22 - 32 mmol/L   Glucose, Bld 116 (H) 65 - 99 mg/dL   BUN <5 (L) 6 - 20 mg/dL   Creatinine, Ser 3.61 0.44 - 1.00 mg/dL   Calcium 8.8 (L) 8.9 - 10.3 mg/dL   Total Protein 5.6 (L) 6.5 - 8.1 g/dL   Albumin 2.8 (L) 3.5 - 5.0 g/dL   AST 17 15 - 41 U/L   ALT 12 (L) 14 - 54 U/L   Alkaline Phosphatase 81 38 - 126 U/L   Total Bilirubin 0.6 0.3 - 1.2 mg/dL   GFR calc non Af Amer 54 (L) >60 mL/min   GFR calc Af Amer >60 >60 mL/min    Comment: (NOTE) The eGFR has been calculated  using the CKD EPI equation. This calculation has not been validated in all clinical situations. eGFR's persistently <60 mL/min signify possible Chronic  Kidney Disease.    Anion gap 6 5 - 15  CBC     Status: Abnormal   Collection Time: 01/04/16  8:08 AM  Result Value Ref Range   WBC 5.6 4.0 - 10.5 K/uL   RBC 3.96 3.87 - 5.11 MIL/uL   Hemoglobin 10.8 (L) 12.0 - 15.0 g/dL   HCT 31.7 (L) 66.3 - 35.1 %   MCV 86.6 78.0 - 100.0 fL   MCH 27.3 26.0 - 34.0 pg   MCHC 31.5 30.0 - 36.0 g/dL   RDW 15.0 53.2 - 41.4 %   Platelets 376 150 - 400 K/uL    Dg Chest 2 View  Result Date: 01/03/2016 CLINICAL DATA:  Chronic heart failure, hypertension, diabetes mellitus, abdominal and epigastric pain after gallbladder resection 2 weeks ago EXAM: CHEST  2 VIEW COMPARISON:  12/18/2015 FINDINGS: Upper normal size of cardiac silhouette with slight pulmonary vascular congestion. Atherosclerotic calcification aorta. Scarring LEFT mid lung at lingula, stable. Mild central peribronchial thickening. No infiltrate, pleural effusion or pneumothorax. Diffuse osseous demineralization with BILATERAL glenohumeral degenerative changes. IMPRESSION: Bronchitic changes with lingular scarring. No acute abnormalities. Aortic atherosclerosis. Electronically Signed   By: Ulyses Southward M.D.   On: 01/03/2016 15:31   Ct Abdomen Pelvis W Contrast  Result Date: 01/03/2016 CLINICAL DATA:  Acute onset of generalized abdominal pain and diarrhea. Initial encounter. EXAM: CT ABDOMEN AND PELVIS WITH CONTRAST TECHNIQUE: Multidetector CT imaging of the abdomen and pelvis was performed using the standard protocol following bolus administration of intravenous contrast. CONTRAST:  100 mL ISOVUE-300 IOPAMIDOL (ISOVUE-300) INJECTION 61% COMPARISON:  CT of the abdomen and pelvis performed 02/03/2015, MRCP performed 12/19/2015, and right upper quadrant ultrasound performed 12/20/2015 FINDINGS: Lower chest: Minimal bibasilar atelectasis or scarring is noted. Diffuse coronary artery calcifications are seen. Hepatobiliary: Intrahepatic biliary ductal dilatation is noted, with dilatation of the common bile duct  to 1.8 cm. This is slightly more prominent than on prior pre-cholecystectomy studies. Pancreas: A 2.8 cm cystic lesion at the proximal body of the pancreas appears grossly stable from 2016, and appeared to reflect a simple cyst on recent MRCP. Spleen: The spleen is unremarkable in appearance. Adrenals/Urinary Tract: The adrenal glands are unremarkable in appearance. There is incomplete rotation of the right kidney. Small left renal cysts are noted. There is no evidence of hydronephrosis. No renal or ureteral stones are identified. Stomach/Bowel: The stomach is unremarkable in appearance. The small bowel is within normal limits. The appendix is not visualized; there is no evidence for appendicitis. Scattered diverticulosis is noted along the sigmoid colon, without evidence of diverticulitis. Contrast progresses to the level of the rectum. Vascular/Lymphatic: Diffuse calcification is seen along the abdominal aorta and its branches. The abdominal aorta is otherwise grossly unremarkable. The inferior vena cava is grossly unremarkable. No retroperitoneal lymphadenopathy is seen. No pelvic sidewall lymphadenopathy is identified. Reproductive: The bladder is mildly distended and within normal limits. The uterus is grossly unremarkable in appearance. The ovaries are relatively symmetric. No suspicious adnexal masses are seen. Evaluation of the pelvis is suboptimal due to metal artifact. Other: No additional soft tissue abnormalities are seen. Musculoskeletal: No acute osseous abnormalities are identified. Multilevel vacuum phenomenon and diffuse sclerosis is noted at the lumbar spine, with mild underlying facet disease. The patient's left hip arthroplasty is grossly unremarkable, though incompletely assessed. The visualized musculature is unremarkable in appearance. IMPRESSION: 1.  No acute abnormality seen to explain the patient's symptoms. 2. Mild diverticulosis along the sigmoid colon, without evidence of diverticulitis.  3. Intrahepatic biliary ductal dilatation, and dilatation of the common bile duct, is only slightly more prominent than on recent prior pre-cholecystectomy studies. 4. Diffuse coronary artery calcifications seen. 5. Relatively stable 2.8 cm cyst at the proximal body of the pancreas, as evaluated on recent MRCP. 6. Small left renal cysts noted. 7. Diffuse aortic atherosclerosis. 8. Mild degenerative change along the lumbar spine. Electronically Signed   By: Garald Balding M.D.   On: 01/03/2016 20:36    ROS negative except above Blood pressure (!) 178/48, pulse 62, temperature 98.2 F (36.8 C), temperature source Oral, resp. rate 16, height '5\' 4"'$  (1.626 m), weight 62.4 kg (137 lb 9.1 oz), SpO2 96 %. Physical Exam elderly pleasant no acute distress she does have tremors lungs are clear decreased heart sounds abdomen is soft minimal right-sided discomfort on palpation positive bowel sounds no guarding or rebound labs and CT okay  Assessment/Plan: Biliary stricture questionable significance and episodic abdominal pain Plan: Will advance diet and recheck liver tests tomorrow and consider ERCP at some point in the future if problem continues and case discussed with the hospital team as above as well as the patient  [facility] E 01/04/2016, 10:29 AM

## 2016-01-04 NOTE — Progress Notes (Signed)
Nutrition Brief Note  Patient identified on the Malnutrition Screening Tool (MST) Report  Wt Readings from Last 15 Encounters:  01/04/16 137 lb 9.1 oz (62.4 kg)  12/23/15 157 lb 14.4 oz (71.6 kg)  02/23/15 167 lb (75.8 kg)  02/16/15 167 lb (75.8 kg)  02/08/15 169 lb 5 oz (76.8 kg)  01/17/15 167 lb 1.7 oz (75.8 kg)  11/18/14 170 lb 6.4 oz (77.3 kg)  07/22/14 170 lb 3.1 oz (77.2 kg)   [name] is a 82 y.o. female with medical history significant for chronic diastolic congestive heart failure, hypertension, stage III chronic kidney disease, known pulmonary lesion or mass, type 2 diabetes, depression, dyslipidemia, and obesity. Patient was recently discharged on 10/20 after an admission for sepsis secondary to Klebsiella pneumonia bacteremia. This was associated with abdominal pain. Patient subsequent underwent uneventful laparoscopic cholecystectomy on 10/18 with intraoperative cholangiogram revealing markedly dilated common bile duct. Patient was initially treated with IV Zosyn for the bacteremia and was discharged on Ceftin which she just completed. Patient reports that she never stopped having abdominal pain and over the past 2 nights it has been worse and she describes it "unbearable". She has not had any nausea or vomiting although she has been reporting some loose stools. She is had subjective chills without fever. She was a direct admission to the medical floor.  Pt admitted with dilated CBD and abdominal pain.   Spoke with pt and pt daughter at bedside. Both report poor appetite over the past 1-2 days PTA due to abdominal pain. Prior to this, pt had a very hearty appetite. Pt typically consumes 2-3 meals per day and does not follow a specific diet. Per pt daughter, she generally consumes softer texture foods due to lack of teeth.   Pt and daughter deny any weight loss, other than "a few pounds" related to NPO status from hospitalizations.   Nutrition-Focused physical exam completed.  Findings are no fat depletion, no muscle depletion, and mild edema.   Pt daughter reports UBW around 160#. Given hx and exam, suspect current wt may be inaccurate.   Pt was just advanced to soft diet and is very eager to eat ("She'd eat right now if you gave her food"). She will occasionally consume Glucerna supplements when appetite is poor.   Body mass index is 23.61 kg/m. Patient meets criteria for normal weight range based on current BMI.   Current diet order is soft, patient is consuming approximately n/a% of meals at this time. Labs and medications reviewed.   No nutrition interventions warranted at this time. If nutrition issues arise, please consult RD.   Advik Weatherspoon A. Jimmye Norman, RD, LDN, CDE Pager: [phone] After hours Pager: [phone]

## 2016-01-05 DIAGNOSIS — K838 Other specified diseases of biliary tract: Secondary | ICD-10-CM | POA: Diagnosis not present

## 2016-01-05 DIAGNOSIS — R1010 Upper abdominal pain, unspecified: Secondary | ICD-10-CM | POA: Diagnosis not present

## 2016-01-05 DIAGNOSIS — N179 Acute kidney failure, unspecified: Secondary | ICD-10-CM | POA: Diagnosis not present

## 2016-01-05 DIAGNOSIS — E669 Obesity, unspecified: Secondary | ICD-10-CM | POA: Diagnosis not present

## 2016-01-05 DIAGNOSIS — I5032 Chronic diastolic (congestive) heart failure: Secondary | ICD-10-CM | POA: Diagnosis not present

## 2016-01-05 DIAGNOSIS — R109 Unspecified abdominal pain: Secondary | ICD-10-CM | POA: Diagnosis not present

## 2016-01-05 DIAGNOSIS — R1013 Epigastric pain: Secondary | ICD-10-CM | POA: Diagnosis not present

## 2016-01-05 LAB — COMPREHENSIVE METABOLIC PANEL
ALBUMIN: 2.8 g/dL — AB (ref 3.5–5.0)
ALK PHOS: 86 U/L (ref 38–126)
ALT: 12 U/L — ABNORMAL LOW (ref 14–54)
ANION GAP: 6 (ref 5–15)
AST: 18 U/L (ref 15–41)
BILIRUBIN TOTAL: 0.5 mg/dL (ref 0.3–1.2)
BUN: 9 mg/dL (ref 6–20)
CALCIUM: 9 mg/dL (ref 8.9–10.3)
CO2: 26 mmol/L (ref 22–32)
Chloride: 110 mmol/L (ref 101–111)
Creatinine, Ser: 1.03 mg/dL — ABNORMAL HIGH (ref 0.44–1.00)
GFR calc Af Amer: 56 mL/min — ABNORMAL LOW (ref >60)
GFR calc non Af Amer: 49 mL/min — ABNORMAL LOW (ref >60)
GLUCOSE: 152 mg/dL — AB (ref 65–99)
Potassium: 4.6 mmol/L (ref 3.5–5.1)
SODIUM: 142 mmol/L (ref 135–145)
TOTAL PROTEIN: 5.9 g/dL — AB (ref 6.5–8.1)

## 2016-01-05 LAB — GLUCOSE, CAPILLARY
Glucose-Capillary: 138 mg/dL — ABNORMAL HIGH (ref 65–99)
Glucose-Capillary: 140 mg/dL — ABNORMAL HIGH (ref 65–99)
Glucose-Capillary: 147 mg/dL — ABNORMAL HIGH (ref 65–99)

## 2016-01-05 LAB — HEPATIC FUNCTION PANEL
ALBUMIN: 2.9 g/dL — AB (ref 3.5–5.0)
ALK PHOS: 86 U/L (ref 38–126)
ALT: 13 U/L — ABNORMAL LOW (ref 14–54)
AST: 19 U/L (ref 15–41)
BILIRUBIN DIRECT: 0.2 mg/dL (ref 0.1–0.5)
BILIRUBIN TOTAL: 0.6 mg/dL (ref 0.3–1.2)
Indirect Bilirubin: 0.4 mg/dL (ref 0.3–0.9)
Total Protein: 6 g/dL — ABNORMAL LOW (ref 6.5–8.1)

## 2016-01-05 LAB — HEMOGLOBIN A1C
Hgb A1c MFr Bld: 7.8 % — ABNORMAL HIGH (ref 4.8–5.6)
Mean Plasma Glucose: 177 mg/dL

## 2016-01-05 NOTE — Progress Notes (Signed)
[name] 9:33 AM  Subjective: Patient feeling fine without abdominal pain and anxious to eat her breakfast and has no new complaints  Objective: Vital signs stable afebrile no acute distress abdomen is soft nontender good bowel sounds labs stable liver tests normal  Assessment: Questionable biliary dyskinesia versus periodic distal CBD obstruction from her diverticuli or stricture  Plan: Okay to go home and follow-up with me in the office in 2 weeks to rediscuss ERCP with her and her family and she agrees with that plan  [facility]  Pager [phone] After 5PM or if no answer call [phone]

## 2016-01-05 NOTE — Progress Notes (Addendum)
BP elevated, MD notified at [phone]. RN instructed to give BP meds scheduled for 1000 earlier. Day shift nurse notified, will administer meds as ordered.

## 2016-01-05 NOTE — Discharge Summary (Signed)
PATIENT DETAILS Name: [name] Age: 82 y.o. Sex: female Date of Birth: Sep 14, 1929 MRN: [id number]. Admitting Physician: Waldemar Dickens, MD [id number] Broadus John, MD  Admit Date: 01/03/2016 Discharge date: 01/05/2016  Recommendations for Outpatient Follow-up:  1. Follow up with PCP in 1-2 weeks 2. Ensure follow up with GI for possible ERCP.  3. Has known pancreatic cyst- will need periodic surveillance.   Admitted From:  Home  Disposition: [place]: No  Equipment/Devices: None  Discharge Condition: Stable  CODE STATUS: FULL CODE  Diet recommendation:  Heart Healthy  Brief Summary: See H&P, Labs, Consult and Test reports for all details in brief, Patient is a 82 y.o. female with a recent history of gram-negative bacteremia in the setting of chronic cholecystitis -status post laparoscopic cholecystectomy- with intra-operative cholangiogram showing a dilated CBD-admitted for evaluation of abdominal pain  Brief Hospital Course: Abdominal pain:? Biliary etiology-however LFTs are completely normal. CT of the abdomen with no acute abnormality. Abdominal pain has improved, she has tolerated a regular diet and GI is recommending further evaluation including ERCP to be done in an outpatient setting.   Dilated CBD: GI M.D. contemplating outpatient ERCP or biliary manometry  Hypertension: BP moderately controlled controlled-continue amlodipine, metoprolol and lisinopril. Further optimization deferred to PCP.  Type II DM: CBGs stable with SSI, resume Amaryl on discharge  Acute kidney injury: Likely mild prerenal azotemia-creatinine has normalized with IV fluids.  Chronic diastolic heart failure: Compensated- resume diuretics on discharge  GERD: Continue PPI  Depression/bipolar disorder: Appears stable-continue Vilazodone, lithium and Remeron  Obesity: Counseled regarding importance of weight loss  Procedures/Studies: None  Discharge Diagnoses:    Principal Problem:   Dilated cbd, acquired Active Problems:   Uncontrolled hypertension   Type 2 diabetes mellitus with hyperglycemia (HCC)   Hyperlipidemia   CKD (chronic kidney disease), stage III   Obesity (BMI 35.0-39.9 without comorbidity) (HCC)   Chronic diastolic congestive heart failure (HCC)   Lingular mass   Depression   Abdominal pain, epigastric   Nausea without vomiting   Discharge Instructions:  Activity:  As tolerated with Full fall precautions use walker/cane & assistance as needed   Discharge Instructions    Call MD for:  persistant nausea and vomiting    Complete by:  As directed    Call MD for:  severe uncontrolled pain    Complete by:  As directed    Diet - low sodium heart healthy    Complete by:  As directed    Increase activity slowly    Complete by:  As directed        Medication List    STOP taking these medications   cefUROXime 500 MG tablet Commonly known as:  CEFTIN     TAKE these medications   acetaminophen 500 MG tablet Commonly known as:  TYLENOL Take 500 mg by mouth every 6 (six) hours as needed for mild pain.   amLODipine 5 MG tablet Commonly known as:  NORVASC Take 1 tablet (5 mg total) by mouth daily.   aspirin EC 81 MG tablet Take 81 mg by mouth daily.   cholecalciferol 1000 units tablet Commonly known as:  VITAMIN D Take 2,000 Units by mouth daily.   conjugated estrogens vaginal cream Commonly known as:  PREMARIN Place 1 Applicatorful vaginally daily as needed (burning).   cyanocobalamin 1000 MCG/ML injection Commonly known as:  (VITAMIN B-12) Inject 1,000 mcg into the muscle every 30 (thirty) days.   furosemide 20 MG tablet Commonly  known as:  LASIX Take 2 tablets (40 mg total) by mouth daily. What changed:  how much to take   glimepiride 1 MG tablet Commonly known as:  AMARYL Take 2 tablets (2 mg total) by mouth every morning.   lisinopril 20 MG tablet Commonly known as:  PRINIVIL,ZESTRIL Take 1 tablet  (20 mg total) by mouth every evening.   lithium carbonate 150 MG capsule Take 150 mg by mouth daily.   metoprolol 50 MG tablet Commonly known as:  LOPRESSOR Take 1 tablet (50 mg total) by mouth 2 (two) times daily.   mirtazapine 30 MG tablet Commonly known as:  REMERON Take 30 mg by mouth at bedtime.   nitroGLYCERIN 0.4 MG SL tablet Commonly known as:  NITROSTAT Place 1 tablet (0.4 mg total) under the tongue every 5 (five) minutes as needed for chest pain.   omeprazole 40 MG capsule Commonly known as:  PRILOSEC Take 40 mg by mouth every evening.   oxybutynin 5 MG tablet Commonly known as:  DITROPAN Take 5 mg by mouth 2 (two) times daily.   pregabalin 75 MG capsule Commonly known as:  LYRICA Take 75 mg by mouth 2 (two) times daily.   traMADol 50 MG tablet Commonly known as:  ULTRAM Take 1 tablet (50 mg total) by mouth 2 (two) times daily as needed (pain). What changed:  when to take this   Vilazodone HCl 20 MG Tabs Take 20 mg by mouth daily. Viibryd   zolpidem 5 MG tablet Commonly known as:  AMBIEN Take 2.5 mg by mouth at bedtime.      Follow-up Information    Irven Shelling, MD. Schedule an appointment as soon as possible for a visit in 1 week(s).   Specialty:  Internal Medicine Contact information: [address] [place] [address] [phone]        MAGOD,MARC E, MD. Schedule an appointment as soon as possible for a visit in 2 week(s).   Specialty:  Gastroenterology Contact information: [id number] [place] [address] [phone]          Allergies  Allergen Reactions  . Codeine Other (See Comments)    slurred speech      Consultations:   GI   Other Procedures/Studies: Dg Chest 2 View  Result Date: 01/03/2016 CLINICAL DATA:  Chronic heart failure, hypertension, diabetes mellitus, abdominal and epigastric pain after gallbladder resection 2 weeks ago EXAM: CHEST  2 VIEW COMPARISON:  12/18/2015  FINDINGS: Upper normal size of cardiac silhouette with slight pulmonary vascular congestion. Atherosclerotic calcification aorta. Scarring LEFT mid lung at lingula, stable. Mild central peribronchial thickening. No infiltrate, pleural effusion or pneumothorax. Diffuse osseous demineralization with BILATERAL glenohumeral degenerative changes. IMPRESSION: Bronchitic changes with lingular scarring. No acute abnormalities. Aortic atherosclerosis. Electronically Signed   By: Lavonia Dana M.D.   On: 01/03/2016 15:31   Dg Cholangiogram Operative  Result Date: 12/21/2015 CLINICAL DATA:  Intraoperative cholangiogram during laparoscopic cholecystectomy. EXAM: INTRAOPERATIVE CHOLANGIOGRAM FLUOROSCOPY TIME:  32 seconds COMPARISON:  Right upper quadrant abdominal ultrasound - 12/20/2015 FINDINGS: Intraoperative cholangiographic images of the right upper abdominal quadrant during laparoscopic cholecystectomy are provided for review. Surgical clips overlie the expected location of the gallbladder fossa. Contrast injection demonstrates selective cannulation of the central aspect of the cystic duct. There is passage of contrast through the central aspect of the cystic duct with filling of a markedly dilated common bile duct. There is passage of contrast though the CBD with very faint opacification  of the duodenum. There is minimal reflux of injected contrast into the common hepatic duct and central aspect of a mildly dilated intrahepatic biliary system. There are no discrete filling defects within the opacified portions of the biliary system to suggest the presence of choledocholithiasis. IMPRESSION: 1. Marked dilatation of the common bile duct without evidence of choledocholithiasis. 2. Minimal passage of contrast into the duodenum, again, without discrete intraluminal filling defect. Findings are nonspecific though could be seen in the setting of biliary sphincter spasm/stenosis. Correlation with the operative report is  recommended. Further evaluation with ERCP could be performed as indicated. Electronically Signed   By: Sandi Mariscal M.D.   On: 12/21/2015 15:42   Ct Abdomen Pelvis W Contrast  Result Date: 01/03/2016 CLINICAL DATA:  Acute onset of generalized abdominal pain and diarrhea. Initial encounter. EXAM: CT ABDOMEN AND PELVIS WITH CONTRAST TECHNIQUE: Multidetector CT imaging of the abdomen and pelvis was performed using the standard protocol following bolus administration of intravenous contrast. CONTRAST:  100 mL ISOVUE-300 IOPAMIDOL (ISOVUE-300) INJECTION 61% COMPARISON:  CT of the abdomen and pelvis performed 02/03/2015, MRCP performed 12/19/2015, and right upper quadrant ultrasound performed 12/20/2015 FINDINGS: Lower chest: Minimal bibasilar atelectasis or scarring is noted. Diffuse coronary artery calcifications are seen. Hepatobiliary: Intrahepatic biliary ductal dilatation is noted, with dilatation of the common bile duct to 1.8 cm. This is slightly more prominent than on prior pre-cholecystectomy studies. Pancreas: A 2.8 cm cystic lesion at the proximal body of the pancreas appears grossly stable from 2016, and appeared to reflect a simple cyst on recent MRCP. Spleen: The spleen is unremarkable in appearance. Adrenals/Urinary Tract: The adrenal glands are unremarkable in appearance. There is incomplete rotation of the right kidney. Small left renal cysts are noted. There is no evidence of hydronephrosis. No renal or ureteral stones are identified. Stomach/Bowel: The stomach is unremarkable in appearance. The small bowel is within normal limits. The appendix is not visualized; there is no evidence for appendicitis. Scattered diverticulosis is noted along the sigmoid colon, without evidence of diverticulitis. Contrast progresses to the level of the rectum. Vascular/Lymphatic: Diffuse calcification is seen along the abdominal aorta and its branches. The abdominal aorta is otherwise grossly unremarkable. The  inferior vena cava is grossly unremarkable. No retroperitoneal lymphadenopathy is seen. No pelvic sidewall lymphadenopathy is identified. Reproductive: The bladder is mildly distended and within normal limits. The uterus is grossly unremarkable in appearance. The ovaries are relatively symmetric. No suspicious adnexal masses are seen. Evaluation of the pelvis is suboptimal due to metal artifact. Other: No additional soft tissue abnormalities are seen. Musculoskeletal: No acute osseous abnormalities are identified. Multilevel vacuum phenomenon and diffuse sclerosis is noted at the lumbar spine, with mild underlying facet disease. The patient's left hip arthroplasty is grossly unremarkable, though incompletely assessed. The visualized musculature is unremarkable in appearance. IMPRESSION: 1. No acute abnormality seen to explain the patient's symptoms. 2. Mild diverticulosis along the sigmoid colon, without evidence of diverticulitis. 3. Intrahepatic biliary ductal dilatation, and dilatation of the common bile duct, is only slightly more prominent than on recent prior pre-cholecystectomy studies. 4. Diffuse coronary artery calcifications seen. 5. Relatively stable 2.8 cm cyst at the proximal body of the pancreas, as evaluated on recent MRCP. 6. Small left renal cysts noted. 7. Diffuse aortic atherosclerosis. 8. Mild degenerative change along the lumbar spine. Electronically Signed   By: Garald Balding M.D.   On: 01/03/2016 20:36   Mr 3d Recon At Scanner  Result Date: 12/19/2015 CLINICAL DATA:  82 year old female  presenting with abnormal LFTs. Abdominal pain and nausea and vomiting. History of prior cholangitis. EXAM: MRI ABDOMEN WITHOUT AND WITH CONTRAST (INCLUDING MRCP) TECHNIQUE: Multiplanar multisequence MR imaging of the abdomen was performed both before and after the administration of intravenous contrast. Heavily T2-weighted images of the biliary and pancreatic ducts were obtained, and three-dimensional MRCP  images were rendered by post processing. CONTRAST:  62mL MULTIHANCE GADOBENATE DIMEGLUMINE 529 MG/ML IV SOLN COMPARISON:  Abdominal MRI dated 02/05/2015 FINDINGS: Evaluation of this exam is limited due to respiratory motion artifact. Lower chest: No acute findings. Hepatobiliary: The liver appears unremarkable. No abnormal enhancement or focal lesion identified. Mild intrahepatic biliary ductal dilatation. There is dilatation of the extrahepatic biliary tree and common bile duct. The common bile duct measures up to 14 mm proximally and 13 mm in diameter in the head of the pancreas. This findings are similar to the prior MRI. The gallbladder mildly distended. Trace fluid is noted adjacent to the gallbladder as seen on the prior studies. No stone identified within the gallbladder or in the CBD. Ultrasound may provide better evaluation of the gallbladder. No focal stenosis of the biliary tree. There is no significant irregularity or beaded appearance of the bile ducts. No definite abnormal enhancement to suggest cholangitis. No occlusive delivery lesions identified. Pancreas: There is a 2.5 cm simple appearing in the uncinate process of the pancreas as seen on the prior MRI. The pancreas otherwise appears unremarkable. The bony pancreatic duct measures approximately 4 mm in diameter. Spleen:  Within normal limits in size and appearance. Adrenals/Urinary Tract: The adrenal glands appear unremarkable. Small left renal measuring up to approximately 15 mm. There is mild atrophy of the renal parenchyma. No hydronephrosis. Stomach/Bowel: Visualized portions within the abdomen are unremarkable. Vascular/Lymphatic: No pathologically enlarged lymph nodes identified. No abdominal aortic aneurysm demonstrated. Other:  None Musculoskeletal: There is degenerative changes of the spine. IMPRESSION: Stable appearing dilatation of the biliary tree similar to prior study and without evidence of obstructing stone or lesion. No definite  evidence of acute cholangitis by MRI. Evaluation however is limited due to respiratory motion artifact. Correlation with clinical exam recommended. Small pericholecystic fluid as seen on the prior studies. Ultrasound may provide better evaluation of the gallbladder. Stable appearing simple cystic lesion at the head of the pancreas. Electronically Signed   By: Anner Crete M.D.   On: 12/19/2015 04:17   US Abdomen Limited  Result Date: 12/20/2015 CLINICAL DATA:  Abnormal LFTs. EXAM: US ABDOMEN LIMITED - RIGHT UPPER QUADRANT COMPARISON:  MRI 12/19/2015 . FINDINGS: Gallbladder: Mild amount of gallbladder sludge cannot be excluded. Severe thickening of the gallbladder wall to 7 mm noted. Small amount of pericholecystic fluid noted. Positive ultrasound Murphy's sign noted. Findings suggest cholecystitis. Common bile duct: Diameter: 17 mm. Liver: No focal hepatic abnormality identified. Echotexture normal. Intrahepatic biliary ductal dilatation noted. Similar finding noted on prior MRI of 12/19/2015. IMPRESSION: 1. Mild of mouth of gallbladder sludge cannot be excluded. Severe thickening of the gallbladder wall up to 7 mm noted. Small amount of pericholecystic fluid noted. Positive ultrasound Murphy sign noted. Findings suggest cholecystitis. 2. Distention of the common bile duct and intrahepatic biliary ducts. Similar finding noted on prior MRI of 12/19/2015. Electronically Signed   By: Marcello Moores  Register   On: 12/20/2015 10:25   Dg Chest Portable 1 View  Result Date: 12/18/2015 CLINICAL DATA:  Chest pain.  Concern for dissection. EXAM: PORTABLE CHEST 1 VIEW COMPARISON:  02/06/2015 FINDINGS: Heart size mildly enlarged. Negative for heart  failure. Mild streaky markings in the bases most consistent with scarring or atelectasis. Negative for pneumonia or effusion. Atherosclerotic calcification in the aorta. Mediastinum not widened. IMPRESSION: Mild bibasilar atelectasis/ scarring. Electronically Signed   By:  Franchot Gallo M.D.   On: 12/18/2015 18:26   Mr Abdomen Mrcp Moise Boring Contast  Result Date: 12/19/2015 CLINICAL DATA:  82 year old female presenting with abnormal LFTs. Abdominal pain and nausea and vomiting. History of prior cholangitis. EXAM: MRI ABDOMEN WITHOUT AND WITH CONTRAST (INCLUDING MRCP) TECHNIQUE: Multiplanar multisequence MR imaging of the abdomen was performed both before and after the administration of intravenous contrast. Heavily T2-weighted images of the biliary and pancreatic ducts were obtained, and three-dimensional MRCP images were rendered by post processing. CONTRAST:  10mL MULTIHANCE GADOBENATE DIMEGLUMINE 529 MG/ML IV SOLN COMPARISON:  Abdominal MRI dated 02/05/2015 FINDINGS: Evaluation of this exam is limited due to respiratory motion artifact. Lower chest: No acute findings. Hepatobiliary: The liver appears unremarkable. No abnormal enhancement or focal lesion identified. Mild intrahepatic biliary ductal dilatation. There is dilatation of the extrahepatic biliary tree and common bile duct. The common bile duct measures up to 14 mm proximally and 13 mm in diameter in the head of the pancreas. This findings are similar to the prior MRI. The gallbladder mildly distended. Trace fluid is noted adjacent to the gallbladder as seen on the prior studies. No stone identified within the gallbladder or in the CBD. Ultrasound may provide better evaluation of the gallbladder. No focal stenosis of the biliary tree. There is no significant irregularity or beaded appearance of the bile ducts. No definite abnormal enhancement to suggest cholangitis. No occlusive delivery lesions identified. Pancreas: There is a 2.5 cm simple appearing in the uncinate process of the pancreas as seen on the prior MRI. The pancreas otherwise appears unremarkable. The bony pancreatic duct measures approximately 4 mm in diameter. Spleen:  Within normal limits in size and appearance. Adrenals/Urinary Tract: The adrenal glands  appear unremarkable. Small left renal measuring up to approximately 15 mm. There is mild atrophy of the renal parenchyma. No hydronephrosis. Stomach/Bowel: Visualized portions within the abdomen are unremarkable. Vascular/Lymphatic: No pathologically enlarged lymph nodes identified. No abdominal aortic aneurysm demonstrated. Other:  None Musculoskeletal: There is degenerative changes of the spine. IMPRESSION: Stable appearing dilatation of the biliary tree similar to prior study and without evidence of obstructing stone or lesion. No definite evidence of acute cholangitis by MRI. Evaluation however is limited due to respiratory motion artifact. Correlation with clinical exam recommended. Small pericholecystic fluid as seen on the prior studies. Ultrasound may provide better evaluation of the gallbladder. Stable appearing simple cystic lesion at the head of the pancreas. Electronically Signed   By: Anner Crete M.D.   On: 12/19/2015 04:17   Ct Angio Chest/abd/pel For Dissection W And/or Wo Contrast  Result Date: 12/18/2015 CLINICAL DATA:  Chest pain.  Incontinence.  Concern for dissection. EXAM: CT ANGIOGRAPHY CHEST, ABDOMEN AND PELVIS TECHNIQUE: Multidetector CT imaging through the chest, abdomen and pelvis was performed using the standard protocol during bolus administration of intravenous contrast. Multiplanar reconstructed images and MIPs were obtained and reviewed to evaluate the vascular anatomy. CONTRAST:  80 cc Isovue 370 intravenous COMPARISON:  MRCP 06/19/2015. FINDINGS: CTA CHEST FINDINGS Cardiovascular: Noncontrast phase shows no intramural hematoma in the aorta. No aortic dissection, aneurysm, or stenosis. Atherosclerosis, including the aorta and coronary arteries. No cardiomegaly. No pericardial effusion. Limited evaluation the pulmonary arteries due to motion. No central filling defect identified. Mediastinum/Nodes: No adenopathy.  Negative  esophagus Lungs/Pleura: Motion degraded evaluation.  Mild atelectasis. There is no edema, consolidation, effusion, or pneumothorax. Musculoskeletal: See below Review of the MIP images confirms the above findings. CTA ABDOMEN AND PELVIS FINDINGS VASCULAR Aorta: Diffuse atherosclerosis.  No aneurysm or stenosis. Celiac: High-grade narrowing, with narrowing accentuated by median arcuate ligament given the morphology. Mild hypertrophy of peripancreatic collaterals. SMA: Atherosclerosis without stenosis. Renals: Atherosclerosis without suspected flow limiting stenosis. Single bilateral renal arteries. IMA: Patent Inflow: Atheromatous without stenosis or occlusion.  No aneurysm. Veins: No obvious venous abnormality within the limitations of this arterial phase study. Review of the MIP images confirms the above findings. NON-VASCULAR Hepatobiliary: No focal liver abnormality.Chronic dilatation of the intrahepatic and extrahepatic biliary tree without visible mass or choledocholithiasis. The common bile duct measures up to 19 mm. The gallbladder is distended and there is mild pericholecystic fluid, similar to 02/05/2015 MRCP. Pancreas: Chronic prominence of the main pancreatic duct measuring up to 4-5 mm. Unilocular pancreatic head cyst measuring 24 mm, which had layering secretions on previous MRI. Punctate calcifications seen dependently today. Spleen: Unremarkable. Adrenals/Urinary Tract: Negative adrenals. No hydronephrosis or stone. Simple appearing left renal cyst. The bladder appears thick walled with mild perivesicular stranding. Sensitivity is diminished by streak artifact from left hip prosthesis. Stomach/Bowel: Extensive distal colonic diverticulosis without active inflammation. No bowel obstruction. No appendicitis. Mid duodenal diverticulum wrapping behind the pancreatic head. Lymphatic:   No mass or adenopathy. Reproductive:No pathologic findings. Other: No ascites or pneumoperitoneum. Musculoskeletal: Severe lumbar disc degeneration with extensive sclerosis and  endplate irregularity. Remote L1 superior endplate fracture. No acute fracture noted. Total left hip arthroplasty without adverse finding. Review of the MIP images confirms the above findings. IMPRESSION: 1. Negative for aortic dissection or other acute arterial finding. 2. Suspect cystitis.  Correlate with urinalysis. 3. Chronic biliary tree dilatation. Reference MRCP 06/19/2015. There is mild pericholecystic fluid, raising the possibility of cholecystitis, but this appearance was also present on December 2016 MRCP. 4. Unilocular, benign-appearing pancreatic head mass that is unchanged from MRCP. 5. 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 Electronically Signed   By: Monte Fantasia M.D.   On: 12/18/2015 20:42      TODAY-DAY OF DISCHARGE:  Subjective:   [name] today has no headache,no chest abdominal pain,no new weakness tingling or numbness, feels much better wants to go home today.   Objective:   Blood pressure (!) 180/60, pulse 61, temperature 97.6 F (36.4 C), temperature source Oral, resp. rate 18, height 5\' 4"  (1.626 m), weight 64.6 kg (142 lb 6.7 oz), SpO2 98 %.  Intake/Output Summary (Last 24 hours) at 01/05/16 1038 Last data filed at 01/05/16 0940  Gross per 24 hour  Intake           816.25 ml  Output              510 ml  Net           306.25 ml   Filed Weights   01/03/16 1200 01/04/16 0505 01/05/16 0446  Weight: 69.9 kg (154 lb) 62.4 kg (137 lb 9.1 oz) 64.6 kg (142 lb 6.7 oz)    Exam: Awake Alert, Oriented *3, No new F.N deficits, Normal affect [place].AT,PERRAL Supple Neck,No JVD, No cervical lymphadenopathy appriciated.  Symmetrical Chest wall movement, Good air movement bilaterally, CTAB RRR,No Gallops,Rubs or new Murmurs, No Parasternal Heave +ve B.Sounds, Abd Soft, Non tender, No organomegaly appriciated, No rebound -guarding or rigidity. No Cyanosis, Clubbing or edema,  No new Rash  or bruise   PERTINENT RADIOLOGIC STUDIES: Dg Chest 2 View  Result Date: 01/03/2016 CLINICAL DATA:  Chronic heart failure, hypertension, diabetes mellitus, abdominal and epigastric pain after gallbladder resection 2 weeks ago EXAM: CHEST  2 VIEW COMPARISON:  12/18/2015 FINDINGS: Upper normal size of cardiac silhouette with slight pulmonary vascular congestion. Atherosclerotic calcification aorta. Scarring LEFT mid lung at lingula, stable. Mild central peribronchial thickening. No infiltrate, pleural effusion or pneumothorax. Diffuse osseous demineralization with BILATERAL glenohumeral degenerative changes. IMPRESSION: Bronchitic changes with lingular scarring. No acute abnormalities. Aortic atherosclerosis. Electronically Signed   By: Lavonia Dana M.D.   On: 01/03/2016 15:31   Dg Cholangiogram Operative  Result Date: 12/21/2015 CLINICAL DATA:  Intraoperative cholangiogram during laparoscopic cholecystectomy. EXAM: INTRAOPERATIVE CHOLANGIOGRAM FLUOROSCOPY TIME:  32 seconds COMPARISON:  Right upper quadrant abdominal ultrasound - 12/20/2015 FINDINGS: Intraoperative cholangiographic images of the right upper abdominal quadrant during laparoscopic cholecystectomy are provided for review. Surgical clips overlie the expected location of the gallbladder fossa. Contrast injection demonstrates selective cannulation of the central aspect of the cystic duct. There is passage of contrast through the central aspect of the cystic duct with filling of a markedly dilated common bile duct. There is passage of contrast though the CBD with very faint opacification of the duodenum. There is minimal reflux of injected contrast into the common hepatic duct and central aspect of a mildly dilated intrahepatic biliary system. There are no discrete filling defects within the opacified portions of the biliary system to suggest the presence of choledocholithiasis. IMPRESSION: 1. Marked dilatation of the common bile duct without  evidence of choledocholithiasis. 2. Minimal passage of contrast into the duodenum, again, without discrete intraluminal filling defect. Findings are nonspecific though could be seen in the setting of biliary sphincter spasm/stenosis. Correlation with the operative report is recommended. Further evaluation with ERCP could be performed as indicated. Electronically Signed   By: Sandi Mariscal M.D.   On: 12/21/2015 15:42   Ct Abdomen Pelvis W Contrast  Result Date: 01/03/2016 CLINICAL DATA:  Acute onset of generalized abdominal pain and diarrhea. Initial encounter. EXAM: CT ABDOMEN AND PELVIS WITH CONTRAST TECHNIQUE: Multidetector CT imaging of the abdomen and pelvis was performed using the standard protocol following bolus administration of intravenous contrast. CONTRAST:  100 mL ISOVUE-300 IOPAMIDOL (ISOVUE-300) INJECTION 61% COMPARISON:  CT of the abdomen and pelvis performed 02/03/2015, MRCP performed 12/19/2015, and right upper quadrant ultrasound performed 12/20/2015 FINDINGS: Lower chest: Minimal bibasilar atelectasis or scarring is noted. Diffuse coronary artery calcifications are seen. Hepatobiliary: Intrahepatic biliary ductal dilatation is noted, with dilatation of the common bile duct to 1.8 cm. This is slightly more prominent than on prior pre-cholecystectomy studies. Pancreas: A 2.8 cm cystic lesion at the proximal body of the pancreas appears grossly stable from 2016, and appeared to reflect a simple cyst on recent MRCP. Spleen: The spleen is unremarkable in appearance. Adrenals/Urinary Tract: The adrenal glands are unremarkable in appearance. There is incomplete rotation of the right kidney. Small left renal cysts are noted. There is no evidence of hydronephrosis. No renal or ureteral stones are identified. Stomach/Bowel: The stomach is unremarkable in appearance. The small bowel is within normal limits. The appendix is not visualized; there is no evidence for appendicitis. Scattered diverticulosis is  noted along the sigmoid colon, without evidence of diverticulitis. Contrast progresses to the level of the rectum. Vascular/Lymphatic: Diffuse calcification is seen along the abdominal aorta and its branches. The abdominal aorta is otherwise grossly unremarkable. The inferior vena cava  is grossly unremarkable. No retroperitoneal lymphadenopathy is seen. No pelvic sidewall lymphadenopathy is identified. Reproductive: The bladder is mildly distended and within normal limits. The uterus is grossly unremarkable in appearance. The ovaries are relatively symmetric. No suspicious adnexal masses are seen. Evaluation of the pelvis is suboptimal due to metal artifact. Other: No additional soft tissue abnormalities are seen. Musculoskeletal: No acute osseous abnormalities are identified. Multilevel vacuum phenomenon and diffuse sclerosis is noted at the lumbar spine, with mild underlying facet disease. The patient's left hip arthroplasty is grossly unremarkable, though incompletely assessed. The visualized musculature is unremarkable in appearance. IMPRESSION: 1. No acute abnormality seen to explain the patient's symptoms. 2. Mild diverticulosis along the sigmoid colon, without evidence of diverticulitis. 3. Intrahepatic biliary ductal dilatation, and dilatation of the common bile duct, is only slightly more prominent than on recent prior pre-cholecystectomy studies. 4. Diffuse coronary artery calcifications seen. 5. Relatively stable 2.8 cm cyst at the proximal body of the pancreas, as evaluated on recent MRCP. 6. Small left renal cysts noted. 7. Diffuse aortic atherosclerosis. 8. Mild degenerative change along the lumbar spine. Electronically Signed   By: Garald Balding M.D.   On: 01/03/2016 20:36   Mr 3d Recon At Scanner  Result Date: 12/19/2015 CLINICAL DATA:  82 year old female presenting with abnormal LFTs. Abdominal pain and nausea and vomiting. History of prior cholangitis. EXAM: MRI ABDOMEN WITHOUT AND WITH  CONTRAST (INCLUDING MRCP) TECHNIQUE: Multiplanar multisequence MR imaging of the abdomen was performed both before and after the administration of intravenous contrast. Heavily T2-weighted images of the biliary and pancreatic ducts were obtained, and three-dimensional MRCP images were rendered by post processing. CONTRAST:  42mL MULTIHANCE GADOBENATE DIMEGLUMINE 529 MG/ML IV SOLN COMPARISON:  Abdominal MRI dated 02/05/2015 FINDINGS: Evaluation of this exam is limited due to respiratory motion artifact. Lower chest: No acute findings. Hepatobiliary: The liver appears unremarkable. No abnormal enhancement or focal lesion identified. Mild intrahepatic biliary ductal dilatation. There is dilatation of the extrahepatic biliary tree and common bile duct. The common bile duct measures up to 14 mm proximally and 13 mm in diameter in the head of the pancreas. This findings are similar to the prior MRI. The gallbladder mildly distended. Trace fluid is noted adjacent to the gallbladder as seen on the prior studies. No stone identified within the gallbladder or in the CBD. Ultrasound may provide better evaluation of the gallbladder. No focal stenosis of the biliary tree. There is no significant irregularity or beaded appearance of the bile ducts. No definite abnormal enhancement to suggest cholangitis. No occlusive delivery lesions identified. Pancreas: There is a 2.5 cm simple appearing in the uncinate process of the pancreas as seen on the prior MRI. The pancreas otherwise appears unremarkable. The bony pancreatic duct measures approximately 4 mm in diameter. Spleen:  Within normal limits in size and appearance. Adrenals/Urinary Tract: The adrenal glands appear unremarkable. Small left renal measuring up to approximately 15 mm. There is mild atrophy of the renal parenchyma. No hydronephrosis. Stomach/Bowel: Visualized portions within the abdomen are unremarkable. Vascular/Lymphatic: No pathologically enlarged lymph nodes  identified. No abdominal aortic aneurysm demonstrated. Other:  None Musculoskeletal: There is degenerative changes of the spine. IMPRESSION: Stable appearing dilatation of the biliary tree similar to prior study and without evidence of obstructing stone or lesion. No definite evidence of acute cholangitis by MRI. Evaluation however is limited due to respiratory motion artifact. Correlation with clinical exam recommended. Small pericholecystic fluid as seen on the prior studies. Ultrasound may provide better evaluation of  the gallbladder. Stable appearing simple cystic lesion at the head of the pancreas. Electronically Signed   By: Anner Crete M.D.   On: 12/19/2015 04:17   US Abdomen Limited  Result Date: 12/20/2015 CLINICAL DATA:  Abnormal LFTs. EXAM: US ABDOMEN LIMITED - RIGHT UPPER QUADRANT COMPARISON:  MRI 12/19/2015 . FINDINGS: Gallbladder: Mild amount of gallbladder sludge cannot be excluded. Severe thickening of the gallbladder wall to 7 mm noted. Small amount of pericholecystic fluid noted. Positive ultrasound Murphy's sign noted. Findings suggest cholecystitis. Common bile duct: Diameter: 17 mm. Liver: No focal hepatic abnormality identified. Echotexture normal. Intrahepatic biliary ductal dilatation noted. Similar finding noted on prior MRI of 12/19/2015. IMPRESSION: 1. Mild of mouth of gallbladder sludge cannot be excluded. Severe thickening of the gallbladder wall up to 7 mm noted. Small amount of pericholecystic fluid noted. Positive ultrasound Murphy sign noted. Findings suggest cholecystitis. 2. Distention of the common bile duct and intrahepatic biliary ducts. Similar finding noted on prior MRI of 12/19/2015. Electronically Signed   By: Marcello Moores  Register   On: 12/20/2015 10:25   Dg Chest Portable 1 View  Result Date: 12/18/2015 CLINICAL DATA:  Chest pain.  Concern for dissection. EXAM: PORTABLE CHEST 1 VIEW COMPARISON:  02/06/2015 FINDINGS: Heart size mildly enlarged. Negative for heart  failure. Mild streaky markings in the bases most consistent with scarring or atelectasis. Negative for pneumonia or effusion. Atherosclerotic calcification in the aorta. Mediastinum not widened. IMPRESSION: Mild bibasilar atelectasis/ scarring. Electronically Signed   By: Franchot Gallo M.D.   On: 12/18/2015 18:26   Mr Abdomen Mrcp Moise Boring Contast  Result Date: 12/19/2015 CLINICAL DATA:  82 year old female presenting with abnormal LFTs. Abdominal pain and nausea and vomiting. History of prior cholangitis. EXAM: MRI ABDOMEN WITHOUT AND WITH CONTRAST (INCLUDING MRCP) TECHNIQUE: Multiplanar multisequence MR imaging of the abdomen was performed both before and after the administration of intravenous contrast. Heavily T2-weighted images of the biliary and pancreatic ducts were obtained, and three-dimensional MRCP images were rendered by post processing. CONTRAST:  72mL MULTIHANCE GADOBENATE DIMEGLUMINE 529 MG/ML IV SOLN COMPARISON:  Abdominal MRI dated 02/05/2015 FINDINGS: Evaluation of this exam is limited due to respiratory motion artifact. Lower chest: No acute findings. Hepatobiliary: The liver appears unremarkable. No abnormal enhancement or focal lesion identified. Mild intrahepatic biliary ductal dilatation. There is dilatation of the extrahepatic biliary tree and common bile duct. The common bile duct measures up to 14 mm proximally and 13 mm in diameter in the head of the pancreas. This findings are similar to the prior MRI. The gallbladder mildly distended. Trace fluid is noted adjacent to the gallbladder as seen on the prior studies. No stone identified within the gallbladder or in the CBD. Ultrasound may provide better evaluation of the gallbladder. No focal stenosis of the biliary tree. There is no significant irregularity or beaded appearance of the bile ducts. No definite abnormal enhancement to suggest cholangitis. No occlusive delivery lesions identified. Pancreas: There is a 2.5 cm simple appearing in  the uncinate process of the pancreas as seen on the prior MRI. The pancreas otherwise appears unremarkable. The bony pancreatic duct measures approximately 4 mm in diameter. Spleen:  Within normal limits in size and appearance. Adrenals/Urinary Tract: The adrenal glands appear unremarkable. Small left renal measuring up to approximately 15 mm. There is mild atrophy of the renal parenchyma. No hydronephrosis. Stomach/Bowel: Visualized portions within the abdomen are unremarkable. Vascular/Lymphatic: No pathologically enlarged lymph nodes identified. No abdominal aortic aneurysm demonstrated. Other:  None Musculoskeletal: There is  degenerative changes of the spine. IMPRESSION: Stable appearing dilatation of the biliary tree similar to prior study and without evidence of obstructing stone or lesion. No definite evidence of acute cholangitis by MRI. Evaluation however is limited due to respiratory motion artifact. Correlation with clinical exam recommended. Small pericholecystic fluid as seen on the prior studies. Ultrasound may provide better evaluation of the gallbladder. Stable appearing simple cystic lesion at the head of the pancreas. Electronically Signed   By: Anner Crete M.D.   On: 12/19/2015 04:17   Ct Angio Chest/abd/pel For Dissection W And/or Wo Contrast  Result Date: 12/18/2015 CLINICAL DATA:  Chest pain.  Incontinence.  Concern for dissection. EXAM: CT ANGIOGRAPHY CHEST, ABDOMEN AND PELVIS TECHNIQUE: Multidetector CT imaging through the chest, abdomen and pelvis was performed using the standard protocol during bolus administration of intravenous contrast. Multiplanar reconstructed images and MIPs were obtained and reviewed to evaluate the vascular anatomy. CONTRAST:  80 cc Isovue 370 intravenous COMPARISON:  MRCP 06/19/2015. FINDINGS: CTA CHEST FINDINGS Cardiovascular: Noncontrast phase shows no intramural hematoma in the aorta. No aortic dissection, aneurysm, or stenosis. Atherosclerosis,  including the aorta and coronary arteries. No cardiomegaly. No pericardial effusion. Limited evaluation the pulmonary arteries due to motion. No central filling defect identified. Mediastinum/Nodes: No adenopathy.  Negative esophagus Lungs/Pleura: Motion degraded evaluation. Mild atelectasis. There is no edema, consolidation, effusion, or pneumothorax. Musculoskeletal: See below Review of the MIP images confirms the above findings. CTA ABDOMEN AND PELVIS FINDINGS VASCULAR Aorta: Diffuse atherosclerosis.  No aneurysm or stenosis. Celiac: High-grade narrowing, with narrowing accentuated by median arcuate ligament given the morphology. Mild hypertrophy of peripancreatic collaterals. SMA: Atherosclerosis without stenosis. Renals: Atherosclerosis without suspected flow limiting stenosis. Single bilateral renal arteries. IMA: Patent Inflow: Atheromatous without stenosis or occlusion.  No aneurysm. Veins: No obvious venous abnormality within the limitations of this arterial phase study. Review of the MIP images confirms the above findings. NON-VASCULAR Hepatobiliary: No focal liver abnormality.Chronic dilatation of the intrahepatic and extrahepatic biliary tree without visible mass or choledocholithiasis. The common bile duct measures up to 19 mm. The gallbladder is distended and there is mild pericholecystic fluid, similar to 02/05/2015 MRCP. Pancreas: Chronic prominence of the main pancreatic duct measuring up to 4-5 mm. Unilocular pancreatic head cyst measuring 24 mm, which had layering secretions on previous MRI. Punctate calcifications seen dependently today. Spleen: Unremarkable. Adrenals/Urinary Tract: Negative adrenals. No hydronephrosis or stone. Simple appearing left renal cyst. The bladder appears thick walled with mild perivesicular stranding. Sensitivity is diminished by streak artifact from left hip prosthesis. Stomach/Bowel: Extensive distal colonic diverticulosis without active inflammation. No bowel  obstruction. No appendicitis. Mid duodenal diverticulum wrapping behind the pancreatic head. Lymphatic:   No mass or adenopathy. Reproductive:No pathologic findings. Other: No ascites or pneumoperitoneum. Musculoskeletal: Severe lumbar disc degeneration with extensive sclerosis and endplate irregularity. Remote L1 superior endplate fracture. No acute fracture noted. Total left hip arthroplasty without adverse finding. Review of the MIP images confirms the above findings. IMPRESSION: 1. Negative for aortic dissection or other acute arterial finding. 2. Suspect cystitis.  Correlate with urinalysis. 3. Chronic biliary tree dilatation. Reference MRCP 06/19/2015. There is mild pericholecystic fluid, raising the possibility of cholecystitis, but this appearance was also present on December 2016 MRCP. 4. Unilocular, benign-appearing pancreatic head mass that is unchanged from MRCP. 5. 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 Electronically Signed   By: Monte Fantasia M.D.   On: 12/18/2015 20:42     PERTINENT LAB  RESULTS: CBC:  Recent Labs  01/03/16 1310 01/04/16 0808  WBC 9.0 5.6  HGB 12.6 10.8*  HCT 39.0 34.3*  PLT 552* 376   CMET CMP     Component Value Date/Time   NA 142 01/05/2016 0635   K 4.6 01/05/2016 0635   CL 110 01/05/2016 0635   CO2 26 01/05/2016 0635   GLUCOSE 152 (H) 01/05/2016 0635   BUN 9 01/05/2016 0635   CREATININE 1.03 (H) 01/05/2016 0635   CALCIUM 9.0 01/05/2016 0635   PROT 5.9 (L) 01/05/2016 0635   PROT 6.0 (L) 01/05/2016 0635   ALBUMIN 2.8 (L) 01/05/2016 0635   ALBUMIN 2.9 (L) 01/05/2016 0635   AST 18 01/05/2016 0635   AST 19 01/05/2016 0635   ALT 12 (L) 01/05/2016 0635   ALT 13 (L) 01/05/2016 0635   ALKPHOS 86 01/05/2016 0635   ALKPHOS 86 01/05/2016 0635   BILITOT 0.5 01/05/2016 0635   BILITOT 0.6 01/05/2016 0635   GFRNONAA 49 (L) 01/05/2016 0635   GFRAA 56 (L) 01/05/2016  0635    GFR Estimated Creatinine Clearance: 35.1 mL/min (by C-G formula based on SCr of 1.03 mg/dL (H)).  Recent Labs  01/03/16 1310  LIPASE 31   No results for input(s): CKTOTAL, CKMB, CKMBINDEX, TROPONINI in the last 72 hours. Invalid input(s): POCBNP No results for input(s): DDIMER in the last 72 hours.  Recent Labs  01/04/16 0808  HGBA1C 7.8*   No results for input(s): CHOL, HDL, LDLCALC, TRIG, CHOLHDL, LDLDIRECT in the last 72 hours. No results for input(s): TSH, T4TOTAL, T3FREE, THYROIDAB in the last 72 hours.  Invalid input(s): FREET3 No results for input(s): VITAMINB12, FOLATE, FERRITIN, TIBC, IRON, RETICCTPCT in the last 72 hours. Coags: No results for input(s): INR in the last 72 hours.  Invalid input(s): PT Microbiology: No results found for this or any previous visit (from the past 240 hour(s)).  FURTHER DISCHARGE INSTRUCTIONS:  Get Medicines reviewed and adjusted: Please take all your medications with you for your next visit with your Primary MD  Laboratory/radiological data: Please request your Primary MD to go over all hospital tests and procedure/radiological results at the follow up, please ask your Primary MD to get all Hospital records sent to his/her office.  In some cases, they will be blood work, cultures and biopsy results pending at the time of your discharge. Please request that your primary care M.D. goes through all the records of your hospital data and follows up on these results.  Also Note the following: If you experience worsening of your admission symptoms, develop shortness of breath, life threatening emergency, suicidal or homicidal thoughts you must seek medical attention immediately by calling 911 or calling your MD immediately  if symptoms less severe.  You must read complete instructions/literature along with all the possible adverse reactions/side effects for all the Medicines you take and that have been prescribed to you. Take any  new Medicines after you have completely understood and accpet all the possible adverse reactions/side effects.   Do not drive when taking Pain medications or sleeping medications (Benzodaizepines)  Do not take more than prescribed Pain, Sleep and Anxiety Medications. It is not advisable to combine anxiety,sleep and pain medications without talking with your primary care practitioner  Special Instructions: If you have smoked or chewed Tobacco  in the last 2 yrs please stop smoking, stop any regular Alcohol  and or any Recreational drug use.  Wear Seat belts while driving.  Please note: You were cared for by  a hospitalist during your hospital stay. Once you are discharged, your primary care physician will handle any further medical issues. Please note that NO REFILLS for any discharge medications will be authorized once you are discharged, as it is imperative that you return to your primary care physician (or establish a relationship with a primary care physician if you do not have one) for your post hospital discharge needs so that they can reassess your need for medications and monitor your lab values.  Total Time spent coordinating discharge including counseling, education and face to face time equals 45 minutes.  Signed: Loni Muse 01/05/2016 10:38 AM   Attending MD note  Patient was seen, examined,treatment plan was discussed with the PA-S.  I have personally reviewed the clinical findings, lab, imaging studies and management of this patient in detail. I agree with the documentation, as recorded by the PA-S.   Patient is doing much better with no abdominal pain. She is tolerating regular diet. Abdomen is soft on exam.  On Exam: Gen. exam: Awake, alert, not in any distress Chest: Good air entry bilaterally, no rhonchi or rales CVS: S1-S2 regular, no murmurs Abdomen: Soft, nontender and nondistended Neurology: Non-focal Skin: No rash or lesions  Assessment and plan  Abdominal  pain: Suspect biliary etiology-however LFTs are still normal. CT scan of the abdomen on admission did not show any acute abnormalities. Since pain has improved with just supportive measures, GI is contemplating further workup including an ERCP to be done in the outpatient setting. No further recommendations from GI-patient is currently stable to be discharged home.  Rest as above  [facility]

## 2016-01-10 DIAGNOSIS — E118 Type 2 diabetes mellitus with unspecified complications: Secondary | ICD-10-CM

## 2016-03-13 ENCOUNTER — Emergency Department (HOSPITAL_COMMUNITY): Payer: PPO

## 2016-03-13 ENCOUNTER — Inpatient Hospital Stay (HOSPITAL_COMMUNITY)
Admission: EM | Admit: 2016-03-13 | Discharge: 2016-03-15 | DRG: 871 | Disposition: A | Discharge status: Home Health | Payer: PPO | Attending: Internal Medicine | Admitting: Internal Medicine

## 2016-03-13 ENCOUNTER — Encounter (HOSPITAL_COMMUNITY): Payer: Self-pay

## 2016-03-13 DIAGNOSIS — J121 Respiratory syncytial virus pneumonia: Secondary | ICD-10-CM | POA: Diagnosis not present

## 2016-03-13 DIAGNOSIS — E876 Hypokalemia: Secondary | ICD-10-CM | POA: Diagnosis not present

## 2016-03-13 DIAGNOSIS — E538 Deficiency of other specified B group vitamins: Secondary | ICD-10-CM | POA: Diagnosis not present

## 2016-03-13 DIAGNOSIS — I130 Hypertensive heart and chronic kidney disease with heart failure and stage 1 through stage 4 chronic kidney disease, or unspecified chronic kidney disease: Secondary | ICD-10-CM | POA: Diagnosis not present

## 2016-03-13 DIAGNOSIS — K838 Other specified diseases of biliary tract: Secondary | ICD-10-CM | POA: Diagnosis not present

## 2016-03-13 DIAGNOSIS — Z79891 Long term (current) use of opiate analgesic: Secondary | ICD-10-CM

## 2016-03-13 DIAGNOSIS — J111 Influenza due to unidentified influenza virus with other respiratory manifestations: Secondary | ICD-10-CM | POA: Diagnosis not present

## 2016-03-13 DIAGNOSIS — Z66 Do not resuscitate: Secondary | ICD-10-CM | POA: Diagnosis present

## 2016-03-13 DIAGNOSIS — Z96652 Presence of left artificial knee joint: Secondary | ICD-10-CM | POA: Diagnosis present

## 2016-03-13 DIAGNOSIS — N3010 Interstitial cystitis (chronic) without hematuria: Secondary | ICD-10-CM | POA: Diagnosis not present

## 2016-03-13 DIAGNOSIS — R0602 Shortness of breath: Secondary | ICD-10-CM | POA: Diagnosis not present

## 2016-03-13 DIAGNOSIS — Z79899 Other long term (current) drug therapy: Secondary | ICD-10-CM | POA: Diagnosis not present

## 2016-03-13 DIAGNOSIS — J181 Lobar pneumonia, unspecified organism: Secondary | ICD-10-CM | POA: Diagnosis present

## 2016-03-13 DIAGNOSIS — H3530 Unspecified macular degeneration: Secondary | ICD-10-CM | POA: Diagnosis not present

## 2016-03-13 DIAGNOSIS — J9601 Acute respiratory failure with hypoxia: Secondary | ICD-10-CM | POA: Diagnosis not present

## 2016-03-13 DIAGNOSIS — E441 Mild protein-calorie malnutrition: Secondary | ICD-10-CM | POA: Diagnosis not present

## 2016-03-13 DIAGNOSIS — K219 Gastro-esophageal reflux disease without esophagitis: Secondary | ICD-10-CM | POA: Diagnosis not present

## 2016-03-13 DIAGNOSIS — E1122 Type 2 diabetes mellitus with diabetic chronic kidney disease: Secondary | ICD-10-CM | POA: Diagnosis present

## 2016-03-13 DIAGNOSIS — G9341 Metabolic encephalopathy: Secondary | ICD-10-CM | POA: Diagnosis present

## 2016-03-13 DIAGNOSIS — Z96642 Presence of left artificial hip joint: Secondary | ICD-10-CM | POA: Diagnosis present

## 2016-03-13 DIAGNOSIS — Z7982 Long term (current) use of aspirin: Secondary | ICD-10-CM | POA: Diagnosis not present

## 2016-03-13 DIAGNOSIS — N183 Chronic kidney disease, stage 3 (moderate): Secondary | ICD-10-CM | POA: Diagnosis not present

## 2016-03-13 DIAGNOSIS — N179 Acute kidney failure, unspecified: Secondary | ICD-10-CM | POA: Diagnosis not present

## 2016-03-13 DIAGNOSIS — N189 Chronic kidney disease, unspecified: Secondary | ICD-10-CM

## 2016-03-13 DIAGNOSIS — A419 Sepsis, unspecified organism: Secondary | ICD-10-CM | POA: Diagnosis not present

## 2016-03-13 DIAGNOSIS — Z6824 Body mass index (BMI) 24.0-24.9, adult: Secondary | ICD-10-CM | POA: Diagnosis not present

## 2016-03-13 DIAGNOSIS — J189 Pneumonia, unspecified organism: Secondary | ICD-10-CM | POA: Diagnosis not present

## 2016-03-13 DIAGNOSIS — Z7984 Long term (current) use of oral hypoglycemic drugs: Secondary | ICD-10-CM

## 2016-03-13 DIAGNOSIS — E118 Type 2 diabetes mellitus with unspecified complications: Secondary | ICD-10-CM | POA: Diagnosis present

## 2016-03-13 DIAGNOSIS — I5032 Chronic diastolic (congestive) heart failure: Secondary | ICD-10-CM | POA: Diagnosis not present

## 2016-03-13 DIAGNOSIS — G9340 Encephalopathy, unspecified: Secondary | ICD-10-CM | POA: Diagnosis present

## 2016-03-13 DIAGNOSIS — F319 Bipolar disorder, unspecified: Secondary | ICD-10-CM | POA: Diagnosis not present

## 2016-03-13 DIAGNOSIS — R69 Illness, unspecified: Secondary | ICD-10-CM | POA: Diagnosis not present

## 2016-03-13 DIAGNOSIS — E785 Hyperlipidemia, unspecified: Secondary | ICD-10-CM | POA: Diagnosis not present

## 2016-03-13 DIAGNOSIS — R069 Unspecified abnormalities of breathing: Secondary | ICD-10-CM | POA: Diagnosis not present

## 2016-03-13 DIAGNOSIS — R6510 Systemic inflammatory response syndrome (SIRS) of non-infectious origin without acute organ dysfunction: Secondary | ICD-10-CM | POA: Diagnosis not present

## 2016-03-13 LAB — COMPREHENSIVE METABOLIC PANEL
ALT: 13 U/L — AB (ref 14–54)
AST: 17 U/L (ref 15–41)
Albumin: 3 g/dL — ABNORMAL LOW (ref 3.5–5.0)
Alkaline Phosphatase: 83 U/L (ref 38–126)
Anion gap: 12 (ref 5–15)
BUN: 19 mg/dL (ref 6–20)
CHLORIDE: 98 mmol/L — AB (ref 101–111)
CO2: 26 mmol/L (ref 22–32)
CREATININE: 1.25 mg/dL — AB (ref 0.44–1.00)
Calcium: 8.9 mg/dL (ref 8.9–10.3)
GFR, EST AFRICAN AMERICAN: 44 mL/min — AB (ref >60)
GFR, EST NON AFRICAN AMERICAN: 38 mL/min — AB (ref >60)
Glucose, Bld: 339 mg/dL — ABNORMAL HIGH (ref 65–99)
POTASSIUM: 3.4 mmol/L — AB (ref 3.5–5.1)
SODIUM: 136 mmol/L (ref 135–145)
Total Bilirubin: 0.9 mg/dL (ref 0.3–1.2)
Total Protein: 6.6 g/dL (ref 6.5–8.1)

## 2016-03-13 LAB — CBG MONITORING, ED: Glucose-Capillary: 306 mg/dL — ABNORMAL HIGH (ref 65–99)

## 2016-03-13 LAB — CBC WITH DIFFERENTIAL/PLATELET
Basophils Absolute: 0 10*3/uL (ref 0.0–0.1)
Basophils Relative: 0 %
EOS ABS: 0 10*3/uL (ref 0.0–0.7)
Eosinophils Relative: 0 %
HCT: 36.4 % (ref 36.0–46.0)
HEMOGLOBIN: 12.2 g/dL (ref 12.0–15.0)
LYMPHS ABS: 1.4 10*3/uL (ref 0.7–4.0)
LYMPHS PCT: 12 %
MCH: 27.7 pg (ref 26.0–34.0)
MCHC: 33.5 g/dL (ref 30.0–36.0)
MCV: 82.7 fL (ref 78.0–100.0)
MONOS PCT: 7 %
Monocytes Absolute: 0.8 10*3/uL (ref 0.1–1.0)
NEUTROS PCT: 81 %
Neutro Abs: 9 10*3/uL — ABNORMAL HIGH (ref 1.7–7.7)
Platelets: 209 10*3/uL (ref 150–400)
RBC: 4.4 MIL/uL (ref 3.87–5.11)
RDW: 13.7 % (ref 11.5–15.5)
WBC: 11.1 10*3/uL — AB (ref 4.0–10.5)

## 2016-03-13 LAB — URINALYSIS, ROUTINE W REFLEX MICROSCOPIC
BILIRUBIN URINE: NEGATIVE
GLUCOSE, UA: 150 mg/dL — AB
HGB URINE DIPSTICK: NEGATIVE
KETONES UR: NEGATIVE mg/dL
Leukocytes, UA: NEGATIVE
NITRITE: NEGATIVE
PH: 5 (ref 5.0–8.0)
Protein, ur: NEGATIVE mg/dL
SPECIFIC GRAVITY, URINE: 1.01 (ref 1.005–1.030)

## 2016-03-13 LAB — I-STAT CG4 LACTIC ACID, ED
LACTIC ACID, VENOUS: 1.68 mmol/L (ref 0.5–1.9)
LACTIC ACID, VENOUS: 0.96 mmol/L (ref 0.5–1.9)

## 2016-03-13 LAB — INFLUENZA PANEL BY PCR (TYPE A & B)
Influenza A By PCR: NEGATIVE
Influenza B By PCR: NEGATIVE

## 2016-03-13 LAB — I-STAT TROPONIN, ED: Troponin i, poc: 0.04 ng/mL (ref 0.00–0.08)

## 2016-03-13 MED ORDER — OXYBUTYNIN CHLORIDE 5 MG PO TABS
5.0000 mg | ORAL_TABLET | Freq: Two times a day (BID) | ORAL | Status: DC
  Administered 2016-03-13 – 2016-03-15 (×4): 5 mg via ORAL
  Filled 2016-03-13 (×4): qty 1

## 2016-03-13 MED ORDER — FUROSEMIDE 20 MG PO TABS
20.0000 mg | ORAL_TABLET | Freq: Every day | ORAL | Status: DC
  Administered 2016-03-14 – 2016-03-15 (×3): 20 mg via ORAL
  Filled 2016-03-13 (×3): qty 1

## 2016-03-13 MED ORDER — DEXTROSE 5 % IV SOLN
1.0000 g | INTRAVENOUS | Status: DC
  Administered 2016-03-14: 1 g via INTRAVENOUS
  Filled 2016-03-13 (×2): qty 10

## 2016-03-13 MED ORDER — PANTOPRAZOLE SODIUM 40 MG PO TBEC
40.0000 mg | DELAYED_RELEASE_TABLET | Freq: Every day | ORAL | Status: DC
  Administered 2016-03-14 – 2016-03-15 (×3): 40 mg via ORAL
  Filled 2016-03-13 (×3): qty 1

## 2016-03-13 MED ORDER — METOPROLOL TARTRATE 50 MG PO TABS
50.0000 mg | ORAL_TABLET | Freq: Two times a day (BID) | ORAL | Status: DC
  Administered 2016-03-13 – 2016-03-15 (×4): 50 mg via ORAL
  Filled 2016-03-13 (×4): qty 1

## 2016-03-13 MED ORDER — LITHIUM CARBONATE 150 MG PO CAPS
150.0000 mg | ORAL_CAPSULE | Freq: Every day | ORAL | Status: DC
  Administered 2016-03-13 – 2016-03-15 (×3): 150 mg via ORAL
  Filled 2016-03-13 (×3): qty 1

## 2016-03-13 MED ORDER — ASPIRIN EC 81 MG PO TBEC
81.0000 mg | DELAYED_RELEASE_TABLET | Freq: Every day | ORAL | Status: DC
  Administered 2016-03-14 – 2016-03-15 (×3): 81 mg via ORAL
  Filled 2016-03-13 (×3): qty 1

## 2016-03-13 MED ORDER — DEXTROSE 5 % IV SOLN
1.0000 g | Freq: Once | INTRAVENOUS | Status: AC
  Administered 2016-03-13: 1 g via INTRAVENOUS
  Filled 2016-03-13: qty 10

## 2016-03-13 MED ORDER — DEXTROSE 5 % IV SOLN: 500.0000 mg | Freq: Once | INTRAVENOUS | Status: DC

## 2016-03-13 MED ORDER — DEXTROSE 5 % IV SOLN
500.0000 mg | INTRAVENOUS | Status: DC
  Filled 2016-03-13: qty 500

## 2016-03-13 MED ORDER — LISINOPRIL 20 MG PO TABS
20.0000 mg | ORAL_TABLET | Freq: Every evening | ORAL | Status: DC
  Administered 2016-03-14 (×2): 20 mg via ORAL
  Filled 2016-03-13 (×3): qty 1

## 2016-03-13 MED ORDER — MIRTAZAPINE 30 MG PO TABS
30.0000 mg | ORAL_TABLET | Freq: Every day | ORAL | Status: DC
  Administered 2016-03-14 (×2): 30 mg via ORAL
  Filled 2016-03-13 (×3): qty 1

## 2016-03-13 MED ORDER — CEFTRIAXONE SODIUM 1 G IJ SOLR: 1.0000 g | Freq: Once | INTRAMUSCULAR | Status: DC

## 2016-03-13 MED ORDER — ALBUTEROL SULFATE (2.5 MG/3ML) 0.083% IN NEBU: 2.5000 mg | INHALATION_SOLUTION | RESPIRATORY_TRACT | Status: DC | PRN

## 2016-03-13 MED ORDER — VILAZODONE HCL 20 MG PO TABS
20.0000 mg | ORAL_TABLET | Freq: Every day | ORAL | Status: DC
  Administered 2016-03-14 – 2016-03-15 (×2): 20 mg via ORAL
  Filled 2016-03-13 (×2): qty 1

## 2016-03-13 MED ORDER — ACETAMINOPHEN 325 MG PO TABS
650.0000 mg | ORAL_TABLET | Freq: Once | ORAL | Status: AC
  Administered 2016-03-13: 650 mg via ORAL
  Filled 2016-03-13: qty 2

## 2016-03-13 MED ORDER — SODIUM CHLORIDE 0.9 % IV BOLUS (SEPSIS)
1000.0000 mL | Freq: Once | INTRAVENOUS | Status: AC
  Administered 2016-03-13: 1000 mL via INTRAVENOUS

## 2016-03-13 MED ORDER — ENOXAPARIN SODIUM 30 MG/0.3ML ~~LOC~~ SOLN
30.0000 mg | SUBCUTANEOUS | Status: DC
  Administered 2016-03-14 – 2016-03-15 (×2): 30 mg via SUBCUTANEOUS
  Filled 2016-03-13 (×2): qty 0.3

## 2016-03-13 MED ORDER — ZOLPIDEM TARTRATE 5 MG PO TABS
2.5000 mg | ORAL_TABLET | Freq: Every day | ORAL | Status: DC
  Administered 2016-03-13 – 2016-03-14 (×2): 2.5 mg via ORAL
  Filled 2016-03-13 (×2): qty 1

## 2016-03-13 MED ORDER — AMLODIPINE BESYLATE 5 MG PO TABS
5.0000 mg | ORAL_TABLET | Freq: Every day | ORAL | Status: DC
  Administered 2016-03-14 – 2016-03-15 (×3): 5 mg via ORAL
  Filled 2016-03-13 (×3): qty 1

## 2016-03-13 MED ORDER — AZITHROMYCIN 500 MG IV SOLR
500.0000 mg | Freq: Once | INTRAVENOUS | Status: AC
  Administered 2016-03-13: 500 mg via INTRAVENOUS
  Filled 2016-03-13: qty 500

## 2016-03-13 MED ORDER — TRAMADOL HCL 50 MG PO TABS
50.0000 mg | ORAL_TABLET | Freq: Two times a day (BID) | ORAL | Status: DC
  Administered 2016-03-13 – 2016-03-15 (×4): 50 mg via ORAL
  Filled 2016-03-13 (×4): qty 1

## 2016-03-13 MED ORDER — PREGABALIN 75 MG PO CAPS
75.0000 mg | ORAL_CAPSULE | Freq: Two times a day (BID) | ORAL | Status: DC
  Administered 2016-03-14 – 2016-03-15 (×4): 75 mg via ORAL
  Filled 2016-03-13 (×4): qty 1

## 2016-03-13 MED ORDER — OSELTAMIVIR PHOSPHATE 30 MG PO CAPS
30.0000 mg | ORAL_CAPSULE | Freq: Once | ORAL | Status: AC
  Administered 2016-03-13: 30 mg via ORAL
  Filled 2016-03-13: qty 1

## 2016-03-13 NOTE — H&P (Signed)
History and Physical    [name] [id number] DOB: 08/29/1929 DOA: 03/13/2016  PCP: Irven Shelling, MD Consultants:  [facility] psychiatrist Patient coming from: home - lives with husband; NOK: daughter, Eliott Nine  Chief Complaint: SOB  HPI: [name] is a 83 y.o. female with medical history significant of HTN, HLD, DM, CKD stage 3, and bipolar disorder presenting with SOB x 1 week.  Has been getting worse.  +cough, nonproductive.  +subjective fever.  No nasal congestion.  Slight LE edema.  No sick contacts.  Some confusion.  85% on RA.    She was previously admission from 10/31-11/2 for abdominal pain and found to have a dilated CBD.  She was to have outpatient GI f/u in 2 weeks and it is not clear if this took place.  ED Course: Per Dr. Eulis Foster:  Patient presented with hypoxia, and fever, concerning for pneumonia. Chest x-ray does not indicate pneumonia. Moderate suspicion for influenza. Initial IV fluid bolus given, fluids changed to [facility], after return of chest x-ray and lab results. Note patient's cardiac function on echo was normal, 2016. Patient stabilized on nasal cannula oxygen with saturation normal 97% on 2 L, at 16:15. Will admit for monitoring and further treatment.  Nursing Notes Reviewed/ Care Coordinated, and agree without changes.  Applicable Imaging Reviewed.  Interpretation of Laboratory Data incorporated into ED treatment  Plan: Admit  Review of Systems: As per HPI; otherwise 10 point review of systems reviewed and negative.   Ambulatory Status:  ambulates with a walker  Past Medical History:  Diagnosis Date  . Chest pain    a. 07/2014 Neg MV, nl EF.  Marland Kitchen Chronic interstitial cystitis   . Chronic low back pain   . CKD (chronic kidney disease), stage III   . Diabetes mellitus without complication ([place])   . GERD (gastroesophageal reflux disease)   . Hyperlipidemia   . Hypertension   . Macular degeneration   . Microhematuria   . Osteopenia   .  Pernicious anemia   . Postlaminectomy syndrome   . PVC (premature ventricular contraction)   . Right carpal tunnel syndrome     Past Surgical History:  Procedure Laterality Date  . BACK SURGERY    . CHOLECYSTECTOMY N/A 12/21/2015   Procedure: LAPAROSCOPIC CHOLECYSTECTOMY WITH INTRAOPERATIVE CHOLANGIOGRAM;  Surgeon: Alphonsa Overall, MD;  Location: [place];  Service: General;  Laterality: N/A;  . EUS N/A 02/23/2015   Procedure: ESOPHAGEAL ENDOSCOPIC ULTRASOUND (EUS) RADIAL;  Surgeon: Arta Silence, MD;  Location: [facility];  Service: Endoscopy;  Laterality: N/A;  . JOINT REPLACEMENT Left    hip and knee    Social History   Social History  . Marital status: Married    Spouse name: N/A  . Number of children: N/A  . Years of education: N/A   Occupational History  . retired    Social History Main Topics  . Smoking status: Never Smoker  . Smokeless tobacco: Never Used  . Alcohol use No  . Drug use: No  . Sexual activity: Not on file   Other Topics Concern  . Not on file   Social History Narrative  . No narrative on file    Allergies  Allergen Reactions  . Codeine Other (See Comments)    slurred speech    Family History  Problem Relation Age of Onset  . Arthritis Mother   . Heart attack Father     Prior to Admission medications   Medication Sig Start Date End Date  Taking? Authorizing Provider  acetaminophen (TYLENOL) 500 MG tablet Take 500 mg by mouth every 6 (six) hours as needed for mild pain.    Historical Provider, MD  amLODipine (NORVASC) 5 MG tablet Take 1 tablet (5 mg total) by mouth daily. 11/21/14   Ripudeep Krystal Eaton, MD  aspirin EC 81 MG tablet Take 81 mg by mouth daily.    Historical Provider, MD  cholecalciferol (VITAMIN D) 1000 UNITS tablet Take 2,000 Units by mouth daily.     Historical Provider, MD  conjugated estrogens (PREMARIN) vaginal cream Place 1 Applicatorful vaginally daily as needed (burning).     Historical Provider, MD  cyanocobalamin  (,VITAMIN B-12,) 1000 MCG/ML injection Inject 1,000 mcg into the muscle every 30 (thirty) days.    Historical Provider, MD  furosemide (LASIX) 20 MG tablet Take 2 tablets (40 mg total) by mouth daily. Patient taking differently: Take 20 mg by mouth daily.  02/08/15   Domenic Polite, MD  glimepiride (AMARYL) 1 MG tablet Take 2 tablets (2 mg total) by mouth every morning. 01/17/15   Charlynne Cousins, MD  lisinopril (PRINIVIL,ZESTRIL) 20 MG tablet Take 1 tablet (20 mg total) by mouth every evening. 07/22/14   Maryann Mikhail, DO  lithium carbonate 150 MG capsule Take 150 mg by mouth daily.    Historical Provider, MD  metoprolol (LOPRESSOR) 50 MG tablet Take 1 tablet (50 mg total) by mouth 2 (two) times daily. 07/22/14   Maryann Mikhail, DO  mirtazapine (REMERON) 30 MG tablet Take 30 mg by mouth at bedtime.     Historical Provider, MD  nitroGLYCERIN (NITROSTAT) 0.4 MG SL tablet Place 1 tablet (0.4 mg total) under the tongue every 5 (five) minutes as needed for chest pain. 07/22/14   Maryann Mikhail, DO  omeprazole (PRILOSEC) 40 MG capsule Take 40 mg by mouth every evening.     Historical Provider, MD  oxybutynin (DITROPAN) 5 MG tablet Take 5 mg by mouth 2 (two) times daily.     Historical Provider, MD  pregabalin (LYRICA) 75 MG capsule Take 75 mg by mouth 2 (two) times daily.    Historical Provider, MD  traMADol (ULTRAM) 50 MG tablet Take 1 tablet (50 mg total) by mouth 2 (two) times daily as needed (pain). Patient taking differently: Take 50 mg by mouth 2 (two) times daily.  07/22/14   Maryann Mikhail, DO  Vilazodone HCl 20 MG TABS Take 20 mg by mouth daily. Viibryd    Historical Provider, MD  zolpidem (AMBIEN) 5 MG tablet Take 2.5 mg by mouth at bedtime.     Historical Provider, MD    Physical Exam: Vitals:   03/13/16 1945 03/13/16 2015 03/13/16 2030 03/13/16 2100  BP: (!) 138/53 140/61 141/88 133/55  Pulse: 78 87 86 84  Resp: 18 19 20 25   Temp:      TempSrc:      SpO2: 96% 96% 96% 97%    Weight:      Height:         General: Appears calm and comfortable and is NAD Eyes:  PERRL, EOMI, normal lids, iris ENT:  grossly normal hearing, lips & tongue, mmm Neck:  no LAD, masses or thyromegaly Cardiovascular:  RRR, no m/r/g. No LE edema.  Respiratory:  CTA bilaterally, no w/r/r. Normal respiratory effort. Abdomen:  soft, ntnd, NABS Skin:  no rash or induration seen on limited exam Musculoskeletal:  grossly normal tone BUE/BLE, good ROM, no bony abnormality Psychiatric:  blunted affect, speech sparse but appropriate, AOx3  Neurologic:  CN 2-12 grossly intact, moves all extremities in coordinated fashion, sensation intact  Labs on Admission: I have personally reviewed following labs and imaging studies  CBC:  Recent Labs Lab 03/13/16 1556  WBC 11.1*  NEUTROABS 9.0*  HGB 12.2  HCT 36.4  MCV 82.7  PLT [id number]   Basic Metabolic Panel:  Recent Labs Lab 03/13/16 1556  NA 136  K 3.4*  CL 98*  CO2 26  GLUCOSE 339*  BUN 19  CREATININE 1.25*  CALCIUM 8.9   GFR: Estimated Creatinine Clearance: 28.9 mL/min (by C-G formula based on SCr of 1.25 mg/dL (H)). Liver Function Tests:  Recent Labs Lab 03/13/16 1556  AST 17  ALT 13*  ALKPHOS 83  BILITOT 0.9  PROT 6.6  ALBUMIN 3.0*   No results for input(s): LIPASE, AMYLASE in the last 168 hours. No results for input(s): AMMONIA in the last 168 hours. Coagulation Profile: No results for input(s): INR, PROTIME in the last 168 hours. Cardiac Enzymes: No results for input(s): CKTOTAL, CKMB, CKMBINDEX, TROPONINI in the last 168 hours. BNP (last 3 results) No results for input(s): PROBNP in the last 8760 hours. HbA1C: No results for input(s): HGBA1C in the last 72 hours. CBG:  Recent Labs Lab 03/13/16 1701  GLUCAP 306*   Lipid Profile: No results for input(s): CHOL, HDL, LDLCALC, TRIG, CHOLHDL, LDLDIRECT in the last 72 hours. Thyroid Function Tests: No results for input(s): TSH, T4TOTAL, FREET4, T3FREE,  THYROIDAB in the last 72 hours. Anemia Panel: No results for input(s): VITAMINB12, FOLATE, FERRITIN, TIBC, IRON, RETICCTPCT in the last 72 hours. Urine analysis:    Component Value Date/Time   COLORURINE YELLOW 03/13/2016 1630   APPEARANCEUR CLEAR 03/13/2016 1630   LABSPEC 1.010 03/13/2016 1630   PHURINE 5.0 03/13/2016 1630   GLUCOSEU 150 (A) 03/13/2016 1630   HGBUR NEGATIVE 03/13/2016 1630   BILIRUBINUR NEGATIVE 03/13/2016 1630   KETONESUR NEGATIVE 03/13/2016 1630   PROTEINUR NEGATIVE 03/13/2016 1630   UROBILINOGEN 0.2 11/20/2014 1405   NITRITE NEGATIVE 03/13/2016 1630   LEUKOCYTESUR NEGATIVE 03/13/2016 1630    Creatinine Clearance: Estimated Creatinine Clearance: 28.9 mL/min (by C-G formula based on SCr of 1.25 mg/dL (H)).  Sepsis Labs: @LABRCNTIP (procalcitonin:4,lacticidven:4) )No results found for this or any previous visit (from the past 240 hour(s)).   Radiological Exams on Admission: Dg Chest Port 1 View  Result Date: 03/13/2016 CLINICAL DATA:  Lethargy and shortness of breath. EXAM: PORTABLE CHEST 1 VIEW COMPARISON:  01/03/2016 FINDINGS: There is evidence of diffuse pulmonary edema. No significant pleural fluid. The heart size is at the upper limits of normal. IMPRESSION: Diffuse pulmonary edema. Electronically Signed   By: Aletta Edouard M.D.   On: 03/13/2016 17:08    EKG: Independently reviewed.  NSR with rate 84; nonspecific ST changes with no evidence of acute ischemia, NSCSLT  Assessment/Plan Principal Problem:   Influenza-like illness Active Problems:   Acute encephalopathy   Acute respiratory failure with hypoxia (HCC)   Acute kidney injury superimposed on chronic kidney disease (HCC)   Dilated cbd, acquired   Diabetes mellitus with complication (HCC)   Mild protein-calorie malnutrition (HCC)   Bipolar disorder (HCC)   Flu-like illness -Influenza testing pending -CXR appeared to show volume overload - which perhaps was from sepsis bolus, but patient  does not appear clinically to be in CHF.  BNP was not done in the ER and so this has been ordered. -Encephalopathy thought to be related to acute febrile respiratory illness -She also did not have  obvious infiltrate on CXR, but she does have cough, fever, and hypoxic respiratory failure and so will treat as CAP for now. -Will start Azithromycin 500 mg PO daily x 5 days AND Rocephin. -Does not have clear sepsis physiology at this time, but blood and urine cultures are pending and will trend lactate. -Hold additional IVF at this time. - Fever control - Repeat CBC in am, WBC 11.1 today - Sputum cultures - albuterol PRN -Supplemental O2 as needed  DM -Glucose 306 -Hold home Amaryl, cover with SSI -She may need additional outpatient medication -Last A1c was 7.8 in 11/17  AKI on CKD -Creatinine 1.25 (1.03 on 11/2) -For now, has been effectively rehydrated  -Will resume home Lasix dose in AM  Mild malnutrition -Albumin 3.0 -Nutrition consult  Bipolar D/O - Continue home meds - Lithium (will check level), Remeron,  Vilazodone, Ambien  HTN -Continue home meds - Norvasc, Lisinopril (hold if creatinine increases any further), Lopressor  Dilated CBD -She does not currently c/o abdominal pain -It is unclear whether she followed up for this issue and had the outpatient ERCP performed -It does not appear that her current issue is related to this problem  DVT prophylaxis: Lovenox  Code Status: DNR - confirmed with patient Family Communication: None present Disposition Plan: Home once clinically improved Consults called: None  Admission status: It is my clinical opinion that referral for OBSERVATION is reasonable and necessary in this patient based on the above information provided. The aforementioned taken together are felt to place the patient at high risk for further clinical deterioration. However it is anticipated that the patient may be medically stable for discharge from the hospital  within 24 to 48 hours.     Karmen Bongo MD [facility]  If 7PM-7AM, please contact night-coverage [email or web address] Password [email or web address]  03/13/2016, 9:41 PM

## 2016-03-13 NOTE — ED Notes (Signed)
MD aware of pt BG.

## 2016-03-13 NOTE — ED Triage Notes (Signed)
Pt via EMS from [place] [facility]. Per pt daughter, pt with lethargy, SOB, and tachypnea since yesterday. Pt 85% on RA  And 93% on 2 L at PCP office with temp of 100.8 degrees. Per EMS, pt has rales bilaterally, given saline neb treatment en route. Per EMS, pt sinus on the monitor. 191/94, 97% on 4 L [place], HR 84, pt with hx of DM with CBG 394. 20 G in L AC. EMS reports pt has L sided weakness and mild L facial droop which is baseline due to past CVA. 100.5 degrees

## 2016-03-13 NOTE — ED Notes (Signed)
Admitting provider at bedside.

## 2016-03-13 NOTE — ED Notes (Addendum)
Unsuccessful IV attempt x 2. Will have another RN attempt to start second line. Phlebotomy called, will begin abx after second blood culture drawn.

## 2016-03-13 NOTE — ED Notes (Signed)
Pt given meal tray.

## 2016-03-13 NOTE — ED Notes (Signed)
Per Dr. Eulis Foster, pt able to eat. Carb modified food tray ordered.

## 2016-03-13 NOTE — ED Notes (Signed)
XR at bedside

## 2016-03-13 NOTE — ED Provider Notes (Signed)
[facility] DEPT Provider Note   CSN: [id number] Arrival date & time: 03/13/16  1526     History   Chief Complaint Chief Complaint  Patient presents with  . Shortness of Breath  . Fatigue    HPI [name] is a 83 y.o. female.  She presents for evaluation of altered mental status, fever and hypoxia, to 85% on room air. She went to see her PCP today, because of lethargy and trouble breathing. The patient is unable to give any history.  Level V caveat- altered mental status  HPI  Past Medical History:  Diagnosis Date  . Chest pain    a. 07/2014 Neg MV, nl EF.  Marland Kitchen Chronic interstitial cystitis   . Chronic low back pain   . CKD (chronic kidney disease), stage III   . Diabetes mellitus without complication ([place])   . GERD (gastroesophageal reflux disease)   . Hyperlipidemia   . Hypertension   . Macular degeneration   . Microhematuria   . Osteopenia   . Pernicious anemia   . Postlaminectomy syndrome   . PVC (premature ventricular contraction)   . Right carpal tunnel syndrome     Patient Active Problem List   Diagnosis Date Noted  . Diabetes mellitus with complication ([place])   . Dilated cbd, acquired 01/03/2016  . Abdominal pain, epigastric 01/03/2016  . Nausea without vomiting 01/03/2016  . Abdominal pain   . Abnormal LFTs 12/18/2015  . Depression 12/18/2015  . Pancreatitis, acute 02/13/2015  . Acute kidney injury superimposed on chronic kidney disease ([place]) 02/13/2015  . Cholecystitis 02/03/2015  . Sepsis ([place]) 02/03/2015  . Essential hypertension, malignant 01/14/2015  . CHF (congestive heart failure) ([place]) 01/14/2015  . Chronic diastolic congestive heart failure ([place]) 01/13/2015  . Atrial flutter, paroxysmal ([place]) 01/13/2015  . Acute respiratory failure with hypoxia ([place]) 01/13/2015  . Lingular mass 01/13/2015  . Ulcer of right heel ([place]) 01/13/2015  . Acute diastolic heart failure, NYHA class 1 ([place]) 01/13/2015  . Hypertensive emergency   . Acute  encephalopathy 11/19/2014  . UTI (urinary tract infection) 11/19/2014  . TIA (transient ischemic attack) 11/18/2014  . Obesity (BMI 35.0-39.9 without comorbidity) ([place]) 11/18/2014  . ALT (SGPT) level raised 11/18/2014  . Chronic diastolic heart failure ([place]) 07/20/2014  . CKD (chronic kidney disease), stage III   . Uncontrolled hypertension 07/18/2014  . Type 2 diabetes mellitus with hyperglycemia ([place]) 07/18/2014  . Hyperlipidemia 07/18/2014    Past Surgical History:  Procedure Laterality Date  . BACK SURGERY    . CHOLECYSTECTOMY N/A 12/21/2015   Procedure: LAPAROSCOPIC CHOLECYSTECTOMY WITH INTRAOPERATIVE CHOLANGIOGRAM;  Surgeon: Alphonsa Overall, MD;  Location: [facility];  Service: General;  Laterality: N/A;  . EUS N/A 02/23/2015   Procedure: ESOPHAGEAL ENDOSCOPIC ULTRASOUND (EUS) RADIAL;  Surgeon: Arta Silence, MD;  Location: [facility];  Service: Endoscopy;  Laterality: N/A;  . JOINT REPLACEMENT Left    hip and knee    OB History    No data available       Home Medications    Prior to Admission medications   Medication Sig Start Date End Date Taking? Authorizing Provider  acetaminophen (TYLENOL) 500 MG tablet Take 500 mg by mouth every 6 (six) hours as needed for mild pain.    Historical Provider, MD  amLODipine (NORVASC) 5 MG tablet Take 1 tablet (5 mg total) by mouth daily. 11/21/14   Ripudeep Krystal Eaton, MD  aspirin EC 81 MG tablet Take 81 mg by mouth daily.  Historical Provider, MD  cholecalciferol (VITAMIN D) 1000 UNITS tablet Take 2,000 Units by mouth daily.     Historical Provider, MD  conjugated estrogens (PREMARIN) vaginal cream Place 1 Applicatorful vaginally daily as needed (burning).     Historical Provider, MD  cyanocobalamin (,VITAMIN B-12,) 1000 MCG/ML injection Inject 1,000 mcg into the muscle every 30 (thirty) days.    Historical Provider, MD  furosemide (LASIX) 20 MG tablet Take 2 tablets (40 mg total) by mouth daily. Patient taking differently: Take 20 mg by  mouth daily.  02/08/15   Domenic Polite, MD  glimepiride (AMARYL) 1 MG tablet Take 2 tablets (2 mg total) by mouth every morning. 01/17/15   Charlynne Cousins, MD  lisinopril (PRINIVIL,ZESTRIL) 20 MG tablet Take 1 tablet (20 mg total) by mouth every evening. 07/22/14   Maryann Mikhail, DO  lithium carbonate 150 MG capsule Take 150 mg by mouth daily.    Historical Provider, MD  metoprolol (LOPRESSOR) 50 MG tablet Take 1 tablet (50 mg total) by mouth 2 (two) times daily. 07/22/14   Maryann Mikhail, DO  mirtazapine (REMERON) 30 MG tablet Take 30 mg by mouth at bedtime.     Historical Provider, MD  nitroGLYCERIN (NITROSTAT) 0.4 MG SL tablet Place 1 tablet (0.4 mg total) under the tongue every 5 (five) minutes as needed for chest pain. 07/22/14   Maryann Mikhail, DO  omeprazole (PRILOSEC) 40 MG capsule Take 40 mg by mouth every evening.     Historical Provider, MD  oxybutynin (DITROPAN) 5 MG tablet Take 5 mg by mouth 2 (two) times daily.     Historical Provider, MD  pregabalin (LYRICA) 75 MG capsule Take 75 mg by mouth 2 (two) times daily.    Historical Provider, MD  traMADol (ULTRAM) 50 MG tablet Take 1 tablet (50 mg total) by mouth 2 (two) times daily as needed (pain). Patient taking differently: Take 50 mg by mouth 2 (two) times daily.  07/22/14   Maryann Mikhail, DO  Vilazodone HCl 20 MG TABS Take 20 mg by mouth daily. Viibryd    Historical Provider, MD  zolpidem (AMBIEN) 5 MG tablet Take 2.5 mg by mouth at bedtime.     Historical Provider, MD    Family History Family History  Problem Relation Age of Onset  . Arthritis Mother   . Heart attack Father     Social History Social History  Substance Use Topics  . Smoking status: Never Smoker  . Smokeless tobacco: Never Used  . Alcohol use No     Allergies   Codeine   Review of Systems Review of Systems  Unable to perform ROS: Mental status change     Physical Exam Updated Vital Signs BP 133/66   Pulse 78   Temp (S) 101 F (38.3  C) (Rectal)   Resp (!) 27   Ht 5\' 4"  (1.626 m)   Wt 143 lb (64.9 kg)   SpO2 93%   BMI 24.55 kg/m   Physical Exam  Constitutional: She appears well-developed. She has a sickly appearance. She appears distressed (Globally weak, requires help to sit.).  Elderly, frail  HENT:  Head: Normocephalic and atraumatic.  Right Ear: External ear normal.  Left Ear: External ear normal.  Eyes: Conjunctivae and EOM are normal. Pupils are equal, round, and reactive to light. Right eye exhibits no discharge. Left eye exhibits no discharge.  Neck: Normal range of motion and phonation normal. Neck supple.  Cardiovascular: Normal rate, regular rhythm and normal heart sounds.  Pulmonary/Chest: Effort normal. She exhibits no bony tenderness.  Rales left base. No increased work of breathing.  Abdominal: Soft. She exhibits no distension. There is no tenderness.  Musculoskeletal: Normal range of motion. She exhibits no edema, tenderness or deformity.  Neurological: She is alert. No cranial nerve deficit or sensory deficit. She exhibits normal muscle tone. Coordination normal.  Skin: Skin is warm, dry and intact.  Psychiatric: She has a normal mood and affect. Her behavior is normal.  Nursing note and vitals reviewed.    ED Treatments / Results  Labs (all labs ordered are listed, but only abnormal results are displayed) Labs Reviewed  COMPREHENSIVE METABOLIC PANEL - Abnormal; Notable for the following:       Result Value   Potassium 3.4 (*)    Chloride 98 (*)    Glucose, Bld 339 (*)    Creatinine, Ser 1.25 (*)    Albumin 3.0 (*)    ALT 13 (*)    GFR calc non Af Amer 38 (*)    GFR calc Af Amer 44 (*)    All other components within normal limits  CBC WITH DIFFERENTIAL/PLATELET - Abnormal; Notable for the following:    WBC 11.1 (*)    Neutro Abs 9.0 (*)    All other components within normal limits  URINALYSIS, ROUTINE W REFLEX MICROSCOPIC - Abnormal; Notable for the following:    Glucose, UA 150  (*)    All other components within normal limits  CBG MONITORING, ED - Abnormal; Notable for the following:    Glucose-Capillary 306 (*)    All other components within normal limits  CULTURE, BLOOD (ROUTINE X 2)  CULTURE, BLOOD (ROUTINE X 2)  URINE CULTURE  INFLUENZA PANEL BY PCR (TYPE A & B, H1N1)  I-STAT CG4 LACTIC ACID, ED  I-STAT TROPOININ, ED  I-STAT CG4 LACTIC ACID, ED    EKG  EKG Interpretation  Date/Time:  Tuesday March 13 2016 17:51:50 EST Ventricular Rate:  79 PR Interval:    QRS Duration: 102 QT Interval:  418 QTC Calculation: 480 R Axis:   -16 Text Interpretation:  Sinus rhythm Abnormal R-wave progression, early transition Probable left ventricular hypertrophy Borderline T abnormalities, diffuse leads since last tracing no significant change Confirmed by Eulis Foster  MD, Roylene Heaton [phone]) on 03/13/2016 6:08:29 PM       Radiology Dg Chest Port 1 View  Result Date: 03/13/2016 CLINICAL DATA:  Lethargy and shortness of breath. EXAM: PORTABLE CHEST 1 VIEW COMPARISON:  01/03/2016 FINDINGS: There is evidence of diffuse pulmonary edema. No significant pleural fluid. The heart size is at the upper limits of normal. IMPRESSION: Diffuse pulmonary edema. Electronically Signed   By: Aletta Edouard M.D.   On: 03/13/2016 17:08    Procedures Procedures (including critical care time)  Medications Ordered in ED Medications  sodium chloride 0.9 % bolus 1,000 mL (0 mLs Intravenous Stopped 03/13/16 1723)    And  sodium chloride 0.9 % bolus 1,000 mL (0 mLs Intravenous Stopped 03/13/16 1732)  cefTRIAXone (ROCEPHIN) 1 g in dextrose 5 % 50 mL IVPB (0 g Intravenous Stopped 03/13/16 1723)  azithromycin (ZITHROMAX) 500 mg in dextrose 5 % 250 mL IVPB (0 mg Intravenous Stopped 03/13/16 1823)  acetaminophen (TYLENOL) tablet 650 mg (650 mg Oral Given 03/13/16 1652)  oseltamivir (TAMIFLU) capsule 30 mg (30 mg Oral Given 03/13/16 1833)     Initial Impression / Assessment and Plan / ED Course  I have  reviewed the triage vital signs and the nursing notes.  Pertinent labs & imaging results that were available during my care of the patient were reviewed by me and considered in my medical decision making (see chart for details).  Clinical Course as of Mar 13 1832  Tue Mar 13, 2016  1718 High Glucose-Capillary: (!) 306 [EW]  1718 Slight elevation WBC: (!) 11.1 [EW]  1719 Slightly low Potassium: (!) 3.4 [EW]  1719 Mild elevation Creatinine: (!) 1.25 [EW]  1719 No apparent pneumonia. Increased pulmonary fluid DG Chest Port 1 View [EW]  1730 Initial lactate normal. At this time. The patient is having increased trouble with cough. Blood pressure has been normal. Evaluation at this time is most consistent with influenza, less likely to indicate pneumonia. Therefore, IV fluids will be changed to [facility].  [EW]  1732 Normal Lactic Acid, Venous: 1.68 [EW]  1732 Slightly low Potassium: (!) 3.4 [EW]  1732 High Glucose: (!) 339 [EW]    Clinical Course User Index [EW] Daleen Bo, MD    Medications  sodium chloride 0.9 % bolus 1,000 mL (0 mLs Intravenous Stopped 03/13/16 1723)    And  sodium chloride 0.9 % bolus 1,000 mL (0 mLs Intravenous Stopped 03/13/16 1732)  cefTRIAXone (ROCEPHIN) 1 g in dextrose 5 % 50 mL IVPB (0 g Intravenous Stopped 03/13/16 1723)  azithromycin (ZITHROMAX) 500 mg in dextrose 5 % 250 mL IVPB (0 mg Intravenous Stopped 03/13/16 1823)  acetaminophen (TYLENOL) tablet 650 mg (650 mg Oral Given 03/13/16 1652)  oseltamivir (TAMIFLU) capsule 30 mg (30 mg Oral Given 03/13/16 1833)    Patient Vitals for the past 24 hrs:  BP Temp Temp src Pulse Resp SpO2 Height Weight  03/13/16 1815 133/66 - - 78 (!) 27 93 % - -  03/13/16 1800 132/79 - - 80 14 95 % - -  03/13/16 1745 130/59 - - 81 (!) 27 95 % - -  03/13/16 1730 160/71 - - 89 22 96 % - -  03/13/16 1715 (!) 134/122 - - 82 (!) 29 96 % - -  03/13/16 1700 172/62 - - 87 21 99 % - -  03/13/16 1615 139/75 - - 84 26 97 % - -  03/13/16 1607 - 101 F  (38.3 C) Rectal - - - - -  03/13/16 1606 174/83 - - 88 19 94 % - -  03/13/16 1545 167/55 - - 83 (!) 34 95 % - -  03/13/16 1533 - - - - - 94 % 5\' 4"  (1.626 m) 143 lb (64.9 kg)  03/13/16 1532 172/67 100.5 F (38.1 C) Oral 83 20 94 % - -  03/13/16 1530 172/67 - - 85 - 90 % - -   5:30 PM Reevaluation with update and discussion. After initial assessment and treatment, an updated evaluation reveals she remains alert, and confused. Daughter reports increased cough, after treatment here. Oxygen saturation normal 97% on nasal cannula oxygen. Patient family members updated on findings, questions answered. Adasha Boehme L   5:38 PM-Consult complete with Hospitalist. Patient case explained and discussed. She agrees to admit patient for further evaluation and treatment. Call ended at q8:25   Final Clinical Impressions(s) / ED Diagnoses   Final diagnoses:  Influenza-like illness    Patient presented with hypoxia, and fever, concerning for pneumonia. Chest x-ray does not indicate pneumonia. Moderate suspicion for influenza. Initial IV fluid bolus given, fluids changed to [facility], after return of chest x-ray and lab results. Note patient's cardiac function on echo was normal, 2016. Patient stabilized on nasal cannula oxygen with saturation  normal 97% on 2 L, at 16:15. Will admit for monitoring and further treatment.  Nursing Notes Reviewed/ Care Coordinated, and agree without changes. Applicable Imaging Reviewed.  Interpretation of Laboratory Data incorporated into ED treatment  Plan: Admit  New Prescriptions New Prescriptions   No medications on file     Daleen Bo, MD 03/13/16 [place]

## 2016-03-13 NOTE — ED Notes (Signed)
ED Provider at bedside. 

## 2016-03-14 ENCOUNTER — Observation Stay (HOSPITAL_COMMUNITY): Payer: PPO

## 2016-03-14 DIAGNOSIS — E538 Deficiency of other specified B group vitamins: Secondary | ICD-10-CM | POA: Diagnosis not present

## 2016-03-14 DIAGNOSIS — K219 Gastro-esophageal reflux disease without esophagitis: Secondary | ICD-10-CM | POA: Diagnosis not present

## 2016-03-14 DIAGNOSIS — R6510 Systemic inflammatory response syndrome (SIRS) of non-infectious origin without acute organ dysfunction: Secondary | ICD-10-CM | POA: Diagnosis not present

## 2016-03-14 DIAGNOSIS — J9601 Acute respiratory failure with hypoxia: Secondary | ICD-10-CM | POA: Diagnosis not present

## 2016-03-14 DIAGNOSIS — Z7982 Long term (current) use of aspirin: Secondary | ICD-10-CM | POA: Diagnosis not present

## 2016-03-14 DIAGNOSIS — Z6824 Body mass index (BMI) 24.0-24.9, adult: Secondary | ICD-10-CM | POA: Diagnosis not present

## 2016-03-14 DIAGNOSIS — E876 Hypokalemia: Secondary | ICD-10-CM | POA: Diagnosis not present

## 2016-03-14 DIAGNOSIS — Z66 Do not resuscitate: Secondary | ICD-10-CM | POA: Diagnosis not present

## 2016-03-14 DIAGNOSIS — E785 Hyperlipidemia, unspecified: Secondary | ICD-10-CM | POA: Diagnosis not present

## 2016-03-14 DIAGNOSIS — Z79899 Other long term (current) drug therapy: Secondary | ICD-10-CM | POA: Diagnosis not present

## 2016-03-14 DIAGNOSIS — I5033 Acute on chronic diastolic (congestive) heart failure: Secondary | ICD-10-CM | POA: Diagnosis not present

## 2016-03-14 DIAGNOSIS — F319 Bipolar disorder, unspecified: Secondary | ICD-10-CM | POA: Diagnosis not present

## 2016-03-14 DIAGNOSIS — Z7984 Long term (current) use of oral hypoglycemic drugs: Secondary | ICD-10-CM | POA: Diagnosis not present

## 2016-03-14 DIAGNOSIS — J121 Respiratory syncytial virus pneumonia: Secondary | ICD-10-CM | POA: Diagnosis not present

## 2016-03-14 DIAGNOSIS — K838 Other specified diseases of biliary tract: Secondary | ICD-10-CM | POA: Diagnosis not present

## 2016-03-14 DIAGNOSIS — I130 Hypertensive heart and chronic kidney disease with heart failure and stage 1 through stage 4 chronic kidney disease, or unspecified chronic kidney disease: Secondary | ICD-10-CM | POA: Diagnosis not present

## 2016-03-14 DIAGNOSIS — I2510 Atherosclerotic heart disease of native coronary artery without angina pectoris: Secondary | ICD-10-CM | POA: Diagnosis not present

## 2016-03-14 DIAGNOSIS — E441 Mild protein-calorie malnutrition: Secondary | ICD-10-CM | POA: Diagnosis not present

## 2016-03-14 DIAGNOSIS — F329 Major depressive disorder, single episode, unspecified: Secondary | ICD-10-CM | POA: Diagnosis not present

## 2016-03-14 DIAGNOSIS — M797 Fibromyalgia: Secondary | ICD-10-CM | POA: Diagnosis not present

## 2016-03-14 DIAGNOSIS — R05 Cough: Secondary | ICD-10-CM | POA: Diagnosis not present

## 2016-03-14 DIAGNOSIS — H3530 Unspecified macular degeneration: Secondary | ICD-10-CM | POA: Diagnosis not present

## 2016-03-14 DIAGNOSIS — R0602 Shortness of breath: Secondary | ICD-10-CM | POA: Diagnosis not present

## 2016-03-14 DIAGNOSIS — Z96652 Presence of left artificial knee joint: Secondary | ICD-10-CM | POA: Diagnosis not present

## 2016-03-14 DIAGNOSIS — N183 Chronic kidney disease, stage 3 (moderate): Secondary | ICD-10-CM | POA: Diagnosis not present

## 2016-03-14 DIAGNOSIS — N179 Acute kidney failure, unspecified: Secondary | ICD-10-CM | POA: Diagnosis not present

## 2016-03-14 DIAGNOSIS — N3010 Interstitial cystitis (chronic) without hematuria: Secondary | ICD-10-CM | POA: Diagnosis not present

## 2016-03-14 DIAGNOSIS — A419 Sepsis, unspecified organism: Secondary | ICD-10-CM | POA: Diagnosis not present

## 2016-03-14 DIAGNOSIS — J181 Lobar pneumonia, unspecified organism: Secondary | ICD-10-CM

## 2016-03-14 DIAGNOSIS — E1122 Type 2 diabetes mellitus with diabetic chronic kidney disease: Secondary | ICD-10-CM | POA: Diagnosis not present

## 2016-03-14 DIAGNOSIS — I5032 Chronic diastolic (congestive) heart failure: Secondary | ICD-10-CM | POA: Diagnosis not present

## 2016-03-14 DIAGNOSIS — G9341 Metabolic encephalopathy: Secondary | ICD-10-CM | POA: Diagnosis not present

## 2016-03-14 DIAGNOSIS — G9340 Encephalopathy, unspecified: Secondary | ICD-10-CM | POA: Diagnosis not present

## 2016-03-14 DIAGNOSIS — R279 Unspecified lack of coordination: Secondary | ICD-10-CM | POA: Diagnosis not present

## 2016-03-14 DIAGNOSIS — R69 Illness, unspecified: Secondary | ICD-10-CM | POA: Diagnosis not present

## 2016-03-14 DIAGNOSIS — M2450 Contracture, unspecified joint: Secondary | ICD-10-CM | POA: Diagnosis not present

## 2016-03-14 LAB — RESPIRATORY PANEL BY PCR
Adenovirus: NOT DETECTED
Bordetella pertussis: NOT DETECTED
Chlamydophila pneumoniae: NOT DETECTED
Coronavirus 229E: NOT DETECTED
Coronavirus HKU1: NOT DETECTED
Coronavirus NL63: NOT DETECTED
Coronavirus OC43: NOT DETECTED
Influenza A H1 2009: NOT DETECTED
Influenza A H1: NOT DETECTED
Influenza A H3: NOT DETECTED
Influenza A: NOT DETECTED
Influenza B: NOT DETECTED
Metapneumovirus: NOT DETECTED
Mycoplasma pneumoniae: NOT DETECTED
Parainfluenza Virus 1: NOT DETECTED
Parainfluenza Virus 2: NOT DETECTED
Parainfluenza Virus 3: NOT DETECTED
Parainfluenza Virus 4: NOT DETECTED
Respiratory Syncytial Virus: DETECTED — AB
Rhinovirus / Enterovirus: NOT DETECTED

## 2016-03-14 LAB — BASIC METABOLIC PANEL
ANION GAP: 12 (ref 5–15)
BUN: 16 mg/dL (ref 6–20)
CHLORIDE: 102 mmol/L (ref 101–111)
CO2: 24 mmol/L (ref 22–32)
Calcium: 8.4 mg/dL — ABNORMAL LOW (ref 8.9–10.3)
Creatinine, Ser: 1.09 mg/dL — ABNORMAL HIGH (ref 0.44–1.00)
GFR calc Af Amer: 52 mL/min — ABNORMAL LOW (ref >60)
GFR, EST NON AFRICAN AMERICAN: 45 mL/min — AB (ref >60)
GLUCOSE: 238 mg/dL — AB (ref 65–99)
POTASSIUM: 3 mmol/L — AB (ref 3.5–5.1)
Sodium: 138 mmol/L (ref 135–145)

## 2016-03-14 LAB — CBC WITH DIFFERENTIAL/PLATELET
Basophils Absolute: 0 K/uL (ref 0.0–0.1)
Basophils Relative: 0 %
Eosinophils Absolute: 0 K/uL (ref 0.0–0.7)
Eosinophils Relative: 0 %
HCT: 32.4 % — ABNORMAL LOW (ref 36.0–46.0)
Hemoglobin: 10.9 g/dL — ABNORMAL LOW (ref 12.0–15.0)
Lymphocytes Relative: 23 %
Lymphs Abs: 1.9 K/uL (ref 0.7–4.0)
MCH: 27.8 pg (ref 26.0–34.0)
MCHC: 33.6 g/dL (ref 30.0–36.0)
MCV: 82.7 fL (ref 78.0–100.0)
Monocytes Absolute: 0.6 K/uL (ref 0.1–1.0)
Monocytes Relative: 7 %
Neutro Abs: 5.9 K/uL (ref 1.7–7.7)
Neutrophils Relative %: 70 %
Platelets: 179 K/uL (ref 150–400)
RBC: 3.92 MIL/uL (ref 3.87–5.11)
RDW: 13.5 % (ref 11.5–15.5)
WBC: 8.4 K/uL (ref 4.0–10.5)

## 2016-03-14 LAB — APTT: aPTT: 33 s (ref 24–36)

## 2016-03-14 LAB — BRAIN NATRIURETIC PEPTIDE: B Natriuretic Peptide: 304.6 pg/mL — ABNORMAL HIGH (ref 0.0–100.0)

## 2016-03-14 LAB — URINE CULTURE

## 2016-03-14 LAB — LITHIUM LEVEL: Lithium Lvl: 0.4 mmol/L — ABNORMAL LOW (ref 0.60–1.20)

## 2016-03-14 LAB — PROCALCITONIN: PROCALCITONIN: 6.37 ng/mL

## 2016-03-14 LAB — LACTIC ACID, PLASMA: Lactic Acid, Venous: 1.4 mmol/L (ref 0.5–1.9)

## 2016-03-14 LAB — PROTIME-INR
INR: 1.23
Prothrombin Time: 15.6 s — ABNORMAL HIGH (ref 11.4–15.2)

## 2016-03-14 LAB — STREP PNEUMONIAE URINARY ANTIGEN: Strep Pneumo Urinary Antigen: POSITIVE — AB

## 2016-03-14 LAB — GLUCOSE, CAPILLARY: Glucose-Capillary: 219 mg/dL — ABNORMAL HIGH (ref 65–99)

## 2016-03-14 MED ORDER — GUAIFENESIN 100 MG/5ML PO SOLN
5.0000 mL | ORAL | Status: DC | PRN
  Administered 2016-03-14: 100 mg via ORAL
  Filled 2016-03-14: qty 5

## 2016-03-14 MED ORDER — ENSURE ENLIVE PO LIQD
237.0000 mL | Freq: Two times a day (BID) | ORAL | Status: DC
  Administered 2016-03-14 – 2016-03-15 (×2): 237 mL via ORAL

## 2016-03-14 MED ORDER — WHITE PETROLATUM GEL
Status: AC
  Administered 2016-03-14: 11:00:00
  Filled 2016-03-14: qty 1

## 2016-03-14 MED ORDER — POTASSIUM CHLORIDE CRYS ER 20 MEQ PO TBCR
40.0000 meq | EXTENDED_RELEASE_TABLET | ORAL | Status: AC
  Administered 2016-03-14 (×2): 40 meq via ORAL
  Filled 2016-03-14 (×2): qty 2

## 2016-03-14 NOTE — Progress Notes (Signed)
Initial Nutrition Assessment  DOCUMENTATION CODES:   Not applicable  INTERVENTION:   -Ensure Enlive po BID, each supplement provides 350 kcal and 20 grams of protein -Downgrade diet to dysphagia 2 (mechanical soft) for ease of intake  -Feeding assistance with meals  NUTRITION DIAGNOSIS:   Inadequate oral intake related to lethargy/confusion as evidenced by per patient/family report, meal completion < 50%.  GOAL:   Patient will meet greater than or equal to 90% of their needs  MONITOR:   PO intake, Supplement acceptance, Labs, Weight trends, Skin, I & O's  REASON FOR ASSESSMENT:   Malnutrition Screening Tool, Consult Assessment of nutrition requirement/status  ASSESSMENT:   [name] is a 83 y.o. female with medical history significant of HTN, HLD, DM, CKD stage 3, and bipolar disorder presenting with SOB x 1 week.  Has been getting worse.  +cough, nonproductive.  +subjective fever.  No nasal congestion.  Slight LE edema.  No sick contacts.  Some confusion.  85% on RA.    Pt admitted with flu-like illness.   Pt very lethargic at time of visit. Hx obtained from pt daughter at bedside, who is very involved in her care. Daughter reports that pt has a fair appetite at baseline, however, has experienced a general decline in intake over the past 3-4 weeks. Pt is now consuming just a few bites at meals and over the past few days has required feeding assistance (at baseline pt can feed herself). Pt has also been experiencing taste changes, reporting nothing tastes good. Of note, pt daughter also shares that pt has ulcers in her mouth that at times can make it very painful to eat and has difficulty consuming tough foods.   Pt was consuming Glucerna supplements PTA, however, started declining them PTA. Pt consumed about 50% of her breakfast this morning (75% of oatmeal, all of orange juice, coffee, and milk). The bread was too hard for pt to eat.  Pt daughter estimated UBW is around  140#. Noted a 7# (4.9%) wt loss over the past 2 months.   Nutrition-Focused physical exam completed. Findings are mild fat depletion, no muscle depletion, and no edema.   Pt daughter very concerned over pt's poor oral intake. Agreeable to downgrading diet for ease of intake. Also amenable to trying Ensure supplements, due to increased nutritional density. Ensure provides 350 kcals, 20 grams of protein (and 44 grams carbohydrate) Vs Glucerna shake which provides 220 kcals, 10 grams protein, and 26 grams carbohydrate.   Albumin has a half-life of 21 days and is strongly affected by stress response and inflammatory process, therefore, do not expect to see an improvement in this lab value during acute hospitalization.  Labs reviewed.   Diet Order:  Diet heart healthy/carb modified Room service appropriate? Yes; Fluid consistency: Thin  Skin:  Reviewed, no issues  Last BM:  03/13/16  Height:   Ht Readings from Last 1 Encounters:  03/13/16 5\' 4"  (1.626 m)    Weight:   Wt Readings from Last 1 Encounters:  03/13/16 135 lb 4.8 oz (61.4 kg)    Ideal Body Weight:  54.5 kg  BMI:  Body mass index is 23.22 kg/m.  Estimated Nutritional Needs:   Kcal:  1650-1850  Protein:  75-90 grams  Fluid:  1.6-1.8 L  EDUCATION NEEDS:   Education needs addressed  Talyn Eddie A. Jimmye Norman, RD, LDN, CDE Pager: [phone] After hours Pager: [phone]

## 2016-03-14 NOTE — Progress Notes (Signed)
Inpatient Diabetes Program Recommendations  AACE/ADA: New Consensus Statement on Inpatient Glycemic Control (2015)  Target Ranges:  Prepandial:   less than 140 mg/dL      Peak postprandial:   less than 180 mg/dL (1-2 hours)      Critically ill patients:  140 - 180 mg/dL   Lab Results  Component Value Date   GLUCAP 219 (H) 03/14/2016   HGBA1C 7.8 (H) 01/04/2016    Review of Glycemic Control:  Results for BALEE, IGNACIO (MRN [id number]) as of 03/14/2016 13:09  Ref. Range 03/13/2016 17:01 03/14/2016 00:54  Glucose-Capillary Latest Ref Range: 65 - 99 mg/dL 306 (H) 219 (H)   Diabetes history: Type 2 diabetes Outpatient Diabetes medications: Amaryl 2 mg daily Current orders for Inpatient glycemic control:  None Inpatient Diabetes Program Recommendations:   Text page sent to Dr. Clementeen Graham regarding potential need for Novolog correction tid with meals.  Thanks, Adah Perl, RN, BC-ADM Inpatient Diabetes Coordinator Pager [phone]

## 2016-03-14 NOTE — Progress Notes (Signed)
PROGRESS NOTE                                                                                                                                                                                                             Patient Demographics:    [name], is a 83 y.o. female, DOB - 1930/01/20, [id number]  Admit date - 03/13/2016   Admitting Physician Karmen Bongo, MD  Outpatient Primary MD for the patient is Irven Shelling, MD  LOS - 0  Outpatient Specialists:NONE  Chief Complaint  Patient presents with  . Shortness of Breath  . Fatigue       Brief Narrative  83 year old female with history of hypertension, hyperlipidemia, diabetes mellitus, sick ED [name] and bipolar disorder presented with one-week history of shortness of breath with associated nonproductive cough and subjective fever. Denies any sick contact. Reportedly had some confusion. Patient was hypoxic with O2 sat of 85% on room air. In the ED she had fever and was hypoxic on room air with symptoms concerning for pneumonia. Portable x-ray done was negative for infiltrate. Placed on observation for flulike symptoms with respiratory manifestations.   Subjective:   Patient informs her breathing slightly better but still has cough.   Assessment  & Plan :    Principal Problem:   SIRS with left lower pneumonia Patient meets criteria for SIRS today with fever of 101F and tachypnea. She still requiring 2 L via nasal cannula. Follow-up two-view chest x-ray showing left lower lobe focal infiltrate. Strep pneumonia antigen positive. Will narrow antibiotic to Rocephin. Flu PCR negative. Follow respiratory viral panel. Blood cultures negative.  Active Problems:   Acute metabolic encephalopathy Likely secondary to SIRS with pneumonia. Improved to baseline.    Acute respiratory failure with hypoxia (HCC) Secondary to pneumonia. Continue O2 via nasal cannula  and when necessary nebs.    Acute kidney injury superimposed on chronic kidney disease (HCC)  Mild. Received IV fluids     Diabetes mellitus with complication (HCC) On Amaryl which is held. Monitor on sliding cervical cultures.    Mild protein-calorie malnutrition ([place])    Bipolar disorder ([place]) Continue lithium. Level subtherapeutic.  B12 deficiency Receives monthly injection  Essential hypertension Continue amlodipine, Lasix and lisinopril.  Hypokalemia Replenished       Code Status : DO NOT RESUSCITATE  Family Communication  :  Daughter at bedside  Disposition Plan  : Home tomorrow if remains afebrile and respiratory symptoms improve  Barriers For Discharge : Active symptoms  Consults  :  None  Procedures  : None  DVT Prophylaxis  :  Lovenox -  Lab Results  Component Value Date   PLT 179 03/14/2016    Antibiotics  :    Anti-infectives    Start     Dose/Rate Route Frequency Ordered Stop   03/14/16 1600  cefTRIAXone (ROCEPHIN) 1 g in dextrose 5 % 50 mL IVPB     1 g 100 mL/hr over 30 Minutes Intravenous Every 24 hours 03/13/16 2145     03/14/16 1600  azithromycin (ZITHROMAX) 500 mg in dextrose 5 % 250 mL IVPB  Status:  Discontinued     500 mg 250 mL/hr over 60 Minutes Intravenous Every 24 hours 03/13/16 2145 03/14/16 1042   03/13/16 2145  cefTRIAXone (ROCEPHIN) 1 g in dextrose 5 % 50 mL IVPB  Status:  Discontinued     1 g 100 mL/hr over 30 Minutes Intravenous  Once 03/13/16 2131 03/13/16 2145   03/13/16 2145  azithromycin (ZITHROMAX) 500 mg in dextrose 5 % 250 mL IVPB  Status:  Discontinued     500 mg 250 mL/hr over 60 Minutes Intravenous  Once 03/13/16 2131 03/13/16 2145   03/13/16 1730  oseltamivir (TAMIFLU) capsule 30 mg     30 mg Oral  Once 03/13/16 1720 03/13/16 1833   03/13/16 1545  cefTRIAXone (ROCEPHIN) 1 g in dextrose 5 % 50 mL IVPB     1 g 100 mL/hr over 30 Minutes Intravenous  Once 03/13/16 1544 03/13/16 1723   03/13/16 1545  azithromycin  (ZITHROMAX) 500 mg in dextrose 5 % 250 mL IVPB     500 mg 250 mL/hr over 60 Minutes Intravenous  Once 03/13/16 1544 03/13/16 1823        Objective:   Vitals:   03/13/16 2215 03/13/16 2252 03/13/16 2330 03/14/16 0543  BP: (!) 145/40  (!) 158/56 (!) 163/51  Pulse: 84  84 79  Resp: 20  18 18   Temp:   98.3 F (36.8 C) 97.5 F (36.4 C)  TempSrc:   Oral Oral  SpO2: 99%  97% 98%  Weight:  61.4 kg (135 lb 4.8 oz)    Height:  5\' 4"  (1.626 m)      Wt Readings from Last 3 Encounters:  03/13/16 61.4 kg (135 lb 4.8 oz)  01/05/16 64.6 kg (142 lb 6.7 oz)  12/23/15 71.6 kg (157 lb 14.4 oz)     Intake/Output Summary (Last 24 hours) at 03/14/16 1501 Last data filed at 03/14/16 0500  Gross per 24 hour  Intake                0 ml  Output              251 ml  Net             -251 ml     Physical Exam  Gen: Appears fatigued HEENT: no pallor, moist mucosa, supple neck Chest: Coarse crackles over bilateral lung base CVS: N S1&S2, no murmurs, rubs or gallop GI: soft, NT, ND, BS+ Musculoskeletal: warm, no edema CNS: Alert and oriented    Data Review:    CBC  Recent Labs Lab 03/13/16 1556 03/14/16 0036  WBC 11.1* 8.4  HGB 12.2 10.9*  HCT 36.4 32.4*  PLT 209 179  MCV 82.7 82.7  MCH 27.7 27.8  MCHC 33.5 33.6  RDW 13.7 13.5  LYMPHSABS 1.4 1.9  MONOABS 0.8 0.6  EOSABS 0.0 0.0  BASOSABS 0.0 0.0    Chemistries   Recent Labs Lab 03/13/16 1556 03/14/16 0036  NA 136 138  K 3.4* 3.0*  CL 98* 102  CO2 26 24  GLUCOSE 339* 238*  BUN 19 16  CREATININE 1.25* 1.09*  CALCIUM 8.9 8.4*  AST 17  --   ALT 13*  --   ALKPHOS 83  --   BILITOT 0.9  --    ------------------------------------------------------------------------------------------------------------------ No results for input(s): CHOL, HDL, LDLCALC, TRIG, CHOLHDL, LDLDIRECT in the last 72 hours.  Lab Results  Component Value Date   HGBA1C 7.8 (H) 01/04/2016    ------------------------------------------------------------------------------------------------------------------ No results for input(s): TSH, T4TOTAL, T3FREE, THYROIDAB in the last 72 hours.  Invalid input(s): FREET3 ------------------------------------------------------------------------------------------------------------------ No results for input(s): VITAMINB12, FOLATE, FERRITIN, TIBC, IRON, RETICCTPCT in the last 72 hours.  Coagulation profile  Recent Labs Lab 03/14/16 0035  INR 1.23    No results for input(s): DDIMER in the last 72 hours.  Cardiac Enzymes No results for input(s): CKMB, TROPONINI, MYOGLOBIN in the last 168 hours.  Invalid input(s): CK ------------------------------------------------------------------------------------------------------------------    Component Value Date/Time   BNP 304.6 (H) 03/14/2016 0035    Inpatient Medications  Scheduled Meds: . amLODipine  5 mg Oral Daily  . aspirin EC  81 mg Oral Daily  . cefTRIAXone (ROCEPHIN)  IV  1 g Intravenous Q24H  . enoxaparin (LOVENOX) injection  30 mg Subcutaneous Q24H  . feeding supplement (ENSURE ENLIVE)  237 mL Oral BID BM  . furosemide  20 mg Oral Daily  . lisinopril  20 mg Oral QPM  . lithium carbonate  150 mg Oral Daily  . metoprolol  50 mg Oral BID  . mirtazapine  30 mg Oral QHS  . oxybutynin  5 mg Oral BID  . pantoprazole  40 mg Oral Daily  . pregabalin  75 mg Oral BID  . traMADol  50 mg Oral BID  . Vilazodone HCl  20 mg Oral Daily  . zolpidem  2.5 mg Oral QHS   Continuous Infusions: PRN Meds:.albuterol  Micro Results Recent Results (from the past 240 hour(s))  Blood Culture (routine x 2)     Status: None (Preliminary result)   Collection Time: 03/13/16  3:36 PM  Result Value Ref Range Status   Specimen Description BLOOD RIGHT ANTECUBITAL  Final   Special Requests BOTTLES DRAWN AEROBIC AND ANAEROBIC 10CC  Final   Culture NO GROWTH < 24 HOURS  Final   Report Status PENDING   Incomplete  Blood Culture (routine x 2)     Status: None (Preliminary result)   Collection Time: 03/13/16  4:53 PM  Result Value Ref Range Status   Specimen Description BLOOD RIGHT FOREARM  Final   Special Requests BOTTLES DRAWN AEROBIC AND ANAEROBIC 5CC  Final   Culture NO GROWTH < 24 HOURS  Final   Report Status PENDING  Incomplete  Culture, blood (routine x 2) Call MD if unable to obtain prior to antibiotics being given     Status: None (Preliminary result)   Collection Time: 03/14/16 12:57 AM  Result Value Ref Range Status   Specimen Description BLOOD RIGHT ARM  Final   Special Requests BOTTLES DRAWN AEROBIC AND ANAEROBIC 5ML  Final   Culture NO GROWTH < 12 HOURS  Final   Report Status PENDING  Incomplete  Culture, blood (routine x 2) Call MD if unable  to obtain prior to antibiotics being given     Status: None (Preliminary result)   Collection Time: 03/14/16  1:06 AM  Result Value Ref Range Status   Specimen Description BLOOD RIGHT ARM  Final   Special Requests AEROBIC BOTTLE ONLY 5ML  Final   Culture NO GROWTH < 12 HOURS  Final   Report Status PENDING  Incomplete    Radiology Reports Dg Chest 2 View  Result Date: 03/14/2016 CLINICAL DATA:  Follow-up of pneumonia. Patient was submitted yesterday lethargic, tachypnea, and exhibiting shortness of breath. Patient reports nonproductive cough today. EXAM: CHEST  2 VIEW COMPARISON:  Portable chest x-ray of March 13, 2016 FINDINGS: The pulmonary interstitial markings have decreased bilaterally. There is subtle nodular density peripherally in the right mid to lower lung. Left lower lobe infiltrate is less conspicuous today. The cardiac silhouette appears smaller. The pulmonary vascularity is less engorged. There is no significant pleural effusion. There is calcification in the wall of the aortic arch. There is moderate degenerative change of both shoulders. Fine details of the spine are not available on this study. There is moderate  dextrocurvature centered at the thoracolumbar junction which may be positional. IMPRESSION: Improved appearance of the pulmonary interstitium and pulmonary vascularity consistent with improving CHF. Decreased conspicuity of left lower lobe pneumonia. Subtle nodular density peripherally in the right lower lung is apparent today and will merit follow-up. This may reflect focal pneumonia or un underlying parenchymal nodule. Thoracic aortic atherosclerosis. Electronically Signed   By: David  Martinique M.D.   On: 03/14/2016 11:57   Dg Chest Port 1 View  Result Date: 03/13/2016 CLINICAL DATA:  Lethargy and shortness of breath. EXAM: PORTABLE CHEST 1 VIEW COMPARISON:  01/03/2016 FINDINGS: There is evidence of diffuse pulmonary edema. No significant pleural fluid. The heart size is at the upper limits of normal. IMPRESSION: Diffuse pulmonary edema. Electronically Signed   By: Aletta Edouard M.D.   On: 03/13/2016 17:08    Time Spent in minutes  25   Louellen Molder M.D on 03/14/2016 at 3:01 PM  Between 7am to 7pm - Pager - [phone]  After 7pm go to [email or web address] - password [email or web address]  [facility] -  Office  [phone]

## 2016-03-14 NOTE — Progress Notes (Signed)
Late entry for 1800 Called received from lab stating pt. Was positive for RSV.  Will continue to following and text page MD.  Alphonzo Lemmings, RN

## 2016-03-15 DIAGNOSIS — J181 Lobar pneumonia, unspecified organism: Secondary | ICD-10-CM | POA: Diagnosis present

## 2016-03-15 DIAGNOSIS — N179 Acute kidney failure, unspecified: Secondary | ICD-10-CM

## 2016-03-15 DIAGNOSIS — R6510 Systemic inflammatory response syndrome (SIRS) of non-infectious origin without acute organ dysfunction: Secondary | ICD-10-CM | POA: Diagnosis not present

## 2016-03-15 DIAGNOSIS — E876 Hypokalemia: Secondary | ICD-10-CM | POA: Diagnosis present

## 2016-03-15 DIAGNOSIS — N189 Chronic kidney disease, unspecified: Secondary | ICD-10-CM

## 2016-03-15 DIAGNOSIS — G9340 Encephalopathy, unspecified: Secondary | ICD-10-CM

## 2016-03-15 DIAGNOSIS — J121 Respiratory syncytial virus pneumonia: Secondary | ICD-10-CM | POA: Diagnosis present

## 2016-03-15 MED ORDER — ENSURE ENLIVE PO LIQD: 237.0000 mL | Freq: Two times a day (BID) | ORAL | 12 refills | Status: DC

## 2016-03-15 MED ORDER — LEVOFLOXACIN 750 MG PO TABS: 750.0000 mg | ORAL_TABLET | Freq: Every day | ORAL | 0 refills | Status: AC

## 2016-03-15 MED ORDER — GUAIFENESIN 100 MG/5ML PO SOLN: 5.0000 mL | ORAL | 0 refills | Status: DC | PRN

## 2016-03-15 NOTE — Care Management Note (Addendum)
Case Management Note  Patient Details  Name: [name] MRN: [id number] Date of Birth: Mar 03, 1930  Subjective/Objective:           Presented with influenza like illness.         Action/Plan: Plan is to d/c today with home oxygen.  Expected Discharge Date:       03/15/2016           Expected Discharge Plan:  [place]  In-House Referral:     Discharge planning Services  CM Consult  Post Acute Care Choice:    Choice offered to:  patient  DME Arranged:   home oxygen DME Agency:    [facility]/ referral made with [facility] @ [phone]. Order pending for home oxygen, request made to MD per CM.   HH Arranged:   PT,RN [place] Agency:  [facility]/ referral made with [facility] @ [phone]  Status of Service:  In process, will continue to follow  If discussed at Long Length of Stay Meetings, dates discussed:    Additional Comments:  Sharin Mons, RN 03/15/2016, 11:50 AM

## 2016-03-15 NOTE — Progress Notes (Addendum)
SATURATION QUALIFICATIONS: (This note is used to comply with regulatory documentation for home oxygen)  Patient Saturations on Room Air at Rest = 92%  Patient Saturations on Room Air while Ambulating = 84%  Patient Saturations on 2 Liters of oxygen while Ambulating = 93%  Please briefly explain why patient needs home oxygen: pt was only able to walk approximately 15 feet before having to sit down and rest.

## 2016-03-15 NOTE — Discharge Summary (Addendum)
Physician Discharge Summary  [name] [id number] DOB: January 27, 1930 DOA: 03/13/2016  PCP: Irven Shelling, MD  Admit date: 03/13/2016 Discharge date: 03/15/2016  Time spent: 30 minutes  Recommendations for Outpatient Follow-up:  #1 Follow up with PCP in 1 week. Patient will complete 5 day course of antibiotics on 1/15. #2  patient will require continuous home oxygen on ambulation and at bedtime (2 L via nasal cannula). Please assess for the need of oxygen during outpatient follow-up.   Home health: RN and PT Equipment: O2 via nasal cannula (2 L on ambulation and bedtime)   CODE STATUS: DO NOT RESUSCITATE Discharge Condition: Fair  Diet: Dysphagia level 2/ carb modified    Discharge Diagnoses:  Principal problem   SIRS (systemic inflammatory response syndrome) ([place])  Active Problems:     RSV (respiratory syncytial virus pneumonia)   Lobar pneumonia, unspecified organism ([place])   Acute encephalopathy   Acute respiratory failure with hypoxia ([place])   Acute kidney injury superimposed on chronic kidney disease (HCC)   Dilated cbd, acquired   Diabetes mellitus with complication (HCC)   Mild protein-calorie malnutrition (HCC)   Bipolar disorder ([place])  Brief narrative/history of present illness 83 year old female with history of hypertension, hyperlipidemia, diabetes mellitus, sick ED [name] and bipolar disorder presented with one-week history of shortness of breath with associated nonproductive cough and subjective fever. Denies any sick contact. Reportedly had some confusion. Patient was hypoxic with O2 sat of 85% on room air. In the ED she had fever and was hypoxic on room air with symptoms concerning for pneumonia. Portable x-ray done was negative for infiltrate. Placed on observation for flulike symptoms with respiratory manifestations.     Filed Weights   03/13/16 1533 03/13/16 2252  Weight: 64.9 kg (143 lb) 61.4 kg (135 lb 4.8 oz)    History of present illness:    Principal Problem:   SIRS with left lower pneumonia Follow-up two-view chest x-ray showing left lower lobe focal infiltrate. Strep pneumonia antigen positive. . Flu PCR negative. Respiratory viral panel positive for RSV. Blood cultures negative. Patient remains afebrile. Will discharge her on Levaquin to complete a total 5 day course of antibiotic. We will arrange home health.  Active Problems:   Acute metabolic encephalopathy Likely secondary to SIRS with pneumonia. Resolved    Acute respiratory failure with hypoxia (HCC) Secondary to pneumonia. Improving. Assess need for home O2.  Patient maintaining O2 sat on room air (92%), sats dropped to 84-91% on ambulation and improved to 93% on 2 L via nasal cannula. Patient will need oxygen (2 L) during ambulation and sleep.     Acute kidney injury superimposed on chronic kidney disease (HCC)  Mild. Prove with IV fluids.     Diabetes mellitus with complication (HCC) CBG stable. Resume Amaryl upon discharge.    Mild protein-calorie malnutrition (HCC) Added supplement.    Bipolar disorder ([place]) Continue lithium. Level subtherapeutic.  B12 deficiency Receives monthly injection  Essential hypertension Continue amlodipine, Lasix and lisinopril.  Hypokalemia Replenished   Patient stable to be discharged home with outpatient follow-up.     Family Communication  :  discussed with daughter bedside on 1/10  Disposition Plan  : Home with home health    Consults  :  None  Procedures  : None   Discharge Exam: Vitals:   03/15/16 0516 03/15/16 1126  BP: 117/80   Pulse: 72   Resp: 18   Temp: 99 F (37.2 C) 98.4 F (36.9 C)    Gen:  Appears fatigued HEENT:  moist mucosa, supple neck Chest: Coarse crackles over left lung CVS: N S1&S2, no murmurs, rubs or gallop GI: soft, NT, ND, BS+ Musculoskeletal: warm, no edema CNS: Alert and oriented  Discharge Instructions    Current Discharge  Medication List    START taking these medications   Details  feeding supplement, ENSURE ENLIVE, (ENSURE ENLIVE) LIQD Take 237 mLs by mouth 2 (two) times daily between meals. Qty: 237 mL, Refills: 12    guaiFENesin (ROBITUSSIN) 100 MG/5ML SOLN Take 5 mLs (100 mg total) by mouth every 4 (four) hours as needed for cough or to loosen phlegm. Qty: 118 mL, Refills: 0    levofloxacin (LEVAQUIN) 750 MG tablet Take 1 tablet (750 mg total) by mouth daily. Qty: 3 tablet, Refills: 0      CONTINUE these medications which have NOT CHANGED   Details  acetaminophen (TYLENOL) 500 MG tablet Take 500 mg by mouth every 6 (six) hours as needed for mild pain.    amLODipine (NORVASC) 5 MG tablet Take 1 tablet (5 mg total) by mouth daily. Qty: 30 tablet, Refills: 3    aspirin EC 81 MG tablet Take 81 mg by mouth daily.    cholecalciferol (VITAMIN D) 1000 UNITS tablet Take 2,000 Units by mouth daily.     conjugated estrogens (PREMARIN) vaginal cream Place 1 Applicatorful vaginally daily as needed (burning).     cyanocobalamin (,VITAMIN B-12,) 1000 MCG/ML injection Inject 1,000 mcg into the muscle every 30 (thirty) days.    furosemide (LASIX) 20 MG tablet Take 2 tablets (40 mg total) by mouth daily. Qty: 30 tablet    glimepiride (AMARYL) 1 MG tablet Take 2 tablets (2 mg total) by mouth every morning. Qty: 30 tablet, Refills: 0    lisinopril (PRINIVIL,ZESTRIL) 20 MG tablet Take 1 tablet (20 mg total) by mouth every evening. Qty: 60 tablet, Refills: 0    lithium carbonate 150 MG capsule Take 150 mg by mouth daily.    metoprolol (LOPRESSOR) 50 MG tablet Take 1 tablet (50 mg total) by mouth 2 (two) times daily. Qty: 60 tablet, Refills: 0    mirtazapine (REMERON) 30 MG tablet Take 30 mg by mouth at bedtime.     nitroGLYCERIN (NITROSTAT) 0.4 MG SL tablet Place 1 tablet (0.4 mg total) under the tongue every 5 (five) minutes as needed for chest pain. Qty: 30 tablet, Refills: 0    omeprazole (PRILOSEC)  40 MG capsule Take 40 mg by mouth every evening.     pregabalin (LYRICA) 75 MG capsule Take 75 mg by mouth 2 (two) times daily.    traMADol (ULTRAM) 50 MG tablet Take 1 tablet (50 mg total) by mouth 2 (two) times daily as needed (pain). Qty: 30 tablet, Refills: 0    Vilazodone HCl 20 MG TABS Take 20 mg by mouth daily. Viibryd    zolpidem (AMBIEN) 5 MG tablet Take 2.5 mg by mouth at bedtime.       STOP taking these medications     oxybutynin (DITROPAN) 5 MG tablet        Allergies  Allergen Reactions  . Codeine Other (See Comments)    slurred speech   Follow-up [facility] Follow up.   Why:  home oxygen arranged Contact information: [address] [place]  [address] [phone]            The results of significant diagnostics from this hospitalization (including imaging, microbiology, ancillary and  laboratory) are listed below for reference.    Significant Diagnostic Studies: Dg Chest 2 View  Result Date: 03/14/2016 CLINICAL DATA:  Follow-up of pneumonia. Patient was submitted yesterday lethargic, tachypnea, and exhibiting shortness of breath. Patient reports nonproductive cough today. EXAM: CHEST  2 VIEW COMPARISON:  Portable chest x-ray of March 13, 2016 FINDINGS: The pulmonary interstitial markings have decreased bilaterally. There is subtle nodular density peripherally in the right mid to lower lung. Left lower lobe infiltrate is less conspicuous today. The cardiac silhouette appears smaller. The pulmonary vascularity is less engorged. There is no significant pleural effusion. There is calcification in the wall of the aortic arch. There is moderate degenerative change of both shoulders. Fine details of the spine are not available on this study. There is moderate dextrocurvature centered at the thoracolumbar junction which may be positional. IMPRESSION: Improved appearance of the pulmonary interstitium and pulmonary vascularity  consistent with improving CHF. Decreased conspicuity of left lower lobe pneumonia. Subtle nodular density peripherally in the right lower lung is apparent today and will merit follow-up. This may reflect focal pneumonia or un underlying parenchymal nodule. Thoracic aortic atherosclerosis. Electronically Signed   By: David  Martinique M.D.   On: 03/14/2016 11:57   Dg Chest Port 1 View  Result Date: 03/13/2016 CLINICAL DATA:  Lethargy and shortness of breath. EXAM: PORTABLE CHEST 1 VIEW COMPARISON:  01/03/2016 FINDINGS: There is evidence of diffuse pulmonary edema. No significant pleural fluid. The heart size is at the upper limits of normal. IMPRESSION: Diffuse pulmonary edema. Electronically Signed   By: Aletta Edouard M.D.   On: 03/13/2016 17:08    Microbiology: Recent Results (from the past 240 hour(s))  Blood Culture (routine x 2)     Status: None (Preliminary result)   Collection Time: 03/13/16  3:36 PM  Result Value Ref Range Status   Specimen Description BLOOD RIGHT ANTECUBITAL  Final   Special Requests BOTTLES DRAWN AEROBIC AND ANAEROBIC 10CC  Final   Culture NO GROWTH < 24 HOURS  Final   Report Status PENDING  Incomplete  Urine culture     Status: Abnormal   Collection Time: 03/13/16  4:30 PM  Result Value Ref Range Status   Specimen Description URINE, CLEAN CATCH  Final   Special Requests NONE  Final   Culture MULTIPLE SPECIES PRESENT, SUGGEST RECOLLECTION (A)  Final   Report Status 03/14/2016 FINAL  Final  Blood Culture (routine x 2)     Status: None (Preliminary result)   Collection Time: 03/13/16  4:53 PM  Result Value Ref Range Status   Specimen Description BLOOD RIGHT FOREARM  Final   Special Requests BOTTLES DRAWN AEROBIC AND ANAEROBIC 5CC  Final   Culture NO GROWTH < 24 HOURS  Final   Report Status PENDING  Incomplete  Culture, blood (routine x 2) Call MD if unable to obtain prior to antibiotics being given     Status: None (Preliminary result)   Collection Time: 03/14/16  12:57 AM  Result Value Ref Range Status   Specimen Description BLOOD RIGHT ARM  Final   Special Requests BOTTLES DRAWN AEROBIC AND ANAEROBIC 5ML  Final   Culture NO GROWTH < 12 HOURS  Final   Report Status PENDING  Incomplete  Culture, blood (routine x 2) Call MD if unable to obtain prior to antibiotics being given     Status: None (Preliminary result)   Collection Time: 03/14/16  1:06 AM  Result Value Ref Range Status   Specimen Description BLOOD RIGHT ARM  Final   Special Requests AEROBIC BOTTLE ONLY 5ML  Final   Culture NO GROWTH < 12 HOURS  Final   Report Status PENDING  Incomplete  Respiratory Panel by PCR     Status: Abnormal   Collection Time: 03/14/16  8:24 AM  Result Value Ref Range Status   Adenovirus NOT DETECTED NOT DETECTED Final   Coronavirus 229E NOT DETECTED NOT DETECTED Final   Coronavirus HKU1 NOT DETECTED NOT DETECTED Final   Coronavirus NL63 NOT DETECTED NOT DETECTED Final   Coronavirus OC43 NOT DETECTED NOT DETECTED Final   Metapneumovirus NOT DETECTED NOT DETECTED Final   Rhinovirus / Enterovirus NOT DETECTED NOT DETECTED Final   Influenza A NOT DETECTED NOT DETECTED Final   Influenza A H1 NOT DETECTED NOT DETECTED Final   Influenza A H1 2009 NOT DETECTED NOT DETECTED Final   Influenza A H3 NOT DETECTED NOT DETECTED Final   Influenza B NOT DETECTED NOT DETECTED Final   Parainfluenza Virus 1 NOT DETECTED NOT DETECTED Final   Parainfluenza Virus 2 NOT DETECTED NOT DETECTED Final   Parainfluenza Virus 3 NOT DETECTED NOT DETECTED Final   Parainfluenza Virus 4 NOT DETECTED NOT DETECTED Final   Respiratory Syncytial Virus DETECTED (A) NOT DETECTED Final    Comment: CRITICAL RESULT CALLED TO, READ BACK BY AND VERIFIED WITHEtheleen Mayhew RN [id number] 03/14/16 A BROWNING    Bordetella pertussis NOT DETECTED NOT DETECTED Final   Chlamydophila pneumoniae NOT DETECTED NOT DETECTED Final   Mycoplasma pneumoniae NOT DETECTED NOT DETECTED Final     Labs: Basic Metabolic  Panel:  Recent Labs Lab 03/13/16 1556 03/14/16 0036  NA 136 138  K 3.4* 3.0*  CL 98* 102  CO2 26 24  GLUCOSE 339* 238*  BUN 19 16  CREATININE 1.25* 1.09*  CALCIUM 8.9 8.4*   Liver Function Tests:  Recent Labs Lab 03/13/16 1556  AST 17  ALT 13*  ALKPHOS 83  BILITOT 0.9  PROT 6.6  ALBUMIN 3.0*   No results for input(s): LIPASE, AMYLASE in the last 168 hours. No results for input(s): AMMONIA in the last 168 hours. CBC:  Recent Labs Lab 03/13/16 1556 03/14/16 0036  WBC 11.1* 8.4  NEUTROABS 9.0* 5.9  HGB 12.2 10.9*  HCT 36.4 32.4*  MCV 82.7 82.7  PLT 209 179   Cardiac Enzymes: No results for input(s): CKTOTAL, CKMB, CKMBINDEX, TROPONINI in the last 168 hours. BNP: BNP (last 3 results)  Recent Labs  03/14/16 0035  BNP 304.6*    ProBNP (last 3 results) No results for input(s): PROBNP in the last 8760 hours.  CBG:  Recent Labs Lab 03/13/16 1701 03/14/16 0054  GLUCAP 306* 219*       Signed:  Louellen Molder MD.  [facility] 03/15/2016, 1:15 PM

## 2016-03-17 DIAGNOSIS — Z9981 Dependence on supplemental oxygen: Secondary | ICD-10-CM | POA: Diagnosis not present

## 2016-03-17 DIAGNOSIS — R6510 Systemic inflammatory response syndrome (SIRS) of non-infectious origin without acute organ dysfunction: Secondary | ICD-10-CM | POA: Diagnosis not present

## 2016-03-17 DIAGNOSIS — E440 Moderate protein-calorie malnutrition: Secondary | ICD-10-CM | POA: Diagnosis not present

## 2016-03-17 DIAGNOSIS — E539 Vitamin B deficiency, unspecified: Secondary | ICD-10-CM | POA: Diagnosis not present

## 2016-03-17 DIAGNOSIS — Z7984 Long term (current) use of oral hypoglycemic drugs: Secondary | ICD-10-CM | POA: Diagnosis not present

## 2016-03-17 DIAGNOSIS — J181 Lobar pneumonia, unspecified organism: Secondary | ICD-10-CM | POA: Diagnosis not present

## 2016-03-17 DIAGNOSIS — E785 Hyperlipidemia, unspecified: Secondary | ICD-10-CM | POA: Diagnosis not present

## 2016-03-17 DIAGNOSIS — J121 Respiratory syncytial virus pneumonia: Secondary | ICD-10-CM | POA: Diagnosis not present

## 2016-03-17 DIAGNOSIS — I129 Hypertensive chronic kidney disease with stage 1 through stage 4 chronic kidney disease, or unspecified chronic kidney disease: Secondary | ICD-10-CM | POA: Diagnosis not present

## 2016-03-17 DIAGNOSIS — N189 Chronic kidney disease, unspecified: Secondary | ICD-10-CM | POA: Diagnosis not present

## 2016-03-17 DIAGNOSIS — Z7982 Long term (current) use of aspirin: Secondary | ICD-10-CM | POA: Diagnosis not present

## 2016-03-17 DIAGNOSIS — N179 Acute kidney failure, unspecified: Secondary | ICD-10-CM | POA: Diagnosis not present

## 2016-03-17 DIAGNOSIS — F319 Bipolar disorder, unspecified: Secondary | ICD-10-CM | POA: Diagnosis not present

## 2016-03-17 DIAGNOSIS — E1122 Type 2 diabetes mellitus with diabetic chronic kidney disease: Secondary | ICD-10-CM | POA: Diagnosis not present

## 2016-03-18 LAB — CULTURE, BLOOD (ROUTINE X 2)
CULTURE: NO GROWTH
Culture: NO GROWTH

## 2016-03-19 LAB — CULTURE, BLOOD (ROUTINE X 2)
CULTURE: NO GROWTH
Culture: NO GROWTH

## 2016-03-30 DIAGNOSIS — F319 Bipolar disorder, unspecified: Secondary | ICD-10-CM | POA: Diagnosis not present

## 2016-03-30 DIAGNOSIS — E539 Vitamin B deficiency, unspecified: Secondary | ICD-10-CM | POA: Diagnosis not present

## 2016-03-30 DIAGNOSIS — Z7982 Long term (current) use of aspirin: Secondary | ICD-10-CM | POA: Diagnosis not present

## 2016-03-30 DIAGNOSIS — E785 Hyperlipidemia, unspecified: Secondary | ICD-10-CM | POA: Diagnosis not present

## 2016-03-30 DIAGNOSIS — N179 Acute kidney failure, unspecified: Secondary | ICD-10-CM | POA: Diagnosis not present

## 2016-03-30 DIAGNOSIS — I129 Hypertensive chronic kidney disease with stage 1 through stage 4 chronic kidney disease, or unspecified chronic kidney disease: Secondary | ICD-10-CM | POA: Diagnosis not present

## 2016-03-30 DIAGNOSIS — J121 Respiratory syncytial virus pneumonia: Secondary | ICD-10-CM | POA: Diagnosis not present

## 2016-03-30 DIAGNOSIS — E1122 Type 2 diabetes mellitus with diabetic chronic kidney disease: Secondary | ICD-10-CM | POA: Diagnosis not present

## 2016-03-30 DIAGNOSIS — R6510 Systemic inflammatory response syndrome (SIRS) of non-infectious origin without acute organ dysfunction: Secondary | ICD-10-CM | POA: Diagnosis not present

## 2016-03-30 DIAGNOSIS — N189 Chronic kidney disease, unspecified: Secondary | ICD-10-CM | POA: Diagnosis not present

## 2016-03-30 DIAGNOSIS — Z7984 Long term (current) use of oral hypoglycemic drugs: Secondary | ICD-10-CM | POA: Diagnosis not present

## 2016-03-30 DIAGNOSIS — E440 Moderate protein-calorie malnutrition: Secondary | ICD-10-CM | POA: Diagnosis not present

## 2016-03-30 DIAGNOSIS — J181 Lobar pneumonia, unspecified organism: Secondary | ICD-10-CM | POA: Diagnosis not present

## 2016-03-30 DIAGNOSIS — Z9981 Dependence on supplemental oxygen: Secondary | ICD-10-CM | POA: Diagnosis not present

## 2016-04-02 DIAGNOSIS — Z7982 Long term (current) use of aspirin: Secondary | ICD-10-CM | POA: Diagnosis not present

## 2016-04-02 DIAGNOSIS — R6510 Systemic inflammatory response syndrome (SIRS) of non-infectious origin without acute organ dysfunction: Secondary | ICD-10-CM | POA: Diagnosis not present

## 2016-04-02 DIAGNOSIS — F319 Bipolar disorder, unspecified: Secondary | ICD-10-CM | POA: Diagnosis not present

## 2016-04-02 DIAGNOSIS — J121 Respiratory syncytial virus pneumonia: Secondary | ICD-10-CM | POA: Diagnosis not present

## 2016-04-02 DIAGNOSIS — E539 Vitamin B deficiency, unspecified: Secondary | ICD-10-CM | POA: Diagnosis not present

## 2016-04-02 DIAGNOSIS — Z9981 Dependence on supplemental oxygen: Secondary | ICD-10-CM | POA: Diagnosis not present

## 2016-04-02 DIAGNOSIS — N179 Acute kidney failure, unspecified: Secondary | ICD-10-CM | POA: Diagnosis not present

## 2016-04-02 DIAGNOSIS — E785 Hyperlipidemia, unspecified: Secondary | ICD-10-CM | POA: Diagnosis not present

## 2016-04-02 DIAGNOSIS — E1122 Type 2 diabetes mellitus with diabetic chronic kidney disease: Secondary | ICD-10-CM | POA: Diagnosis not present

## 2016-04-02 DIAGNOSIS — Z7984 Long term (current) use of oral hypoglycemic drugs: Secondary | ICD-10-CM | POA: Diagnosis not present

## 2016-04-02 DIAGNOSIS — I129 Hypertensive chronic kidney disease with stage 1 through stage 4 chronic kidney disease, or unspecified chronic kidney disease: Secondary | ICD-10-CM | POA: Diagnosis not present

## 2016-04-02 DIAGNOSIS — N189 Chronic kidney disease, unspecified: Secondary | ICD-10-CM | POA: Diagnosis not present

## 2016-04-02 DIAGNOSIS — J181 Lobar pneumonia, unspecified organism: Secondary | ICD-10-CM | POA: Diagnosis not present

## 2016-04-02 DIAGNOSIS — E440 Moderate protein-calorie malnutrition: Secondary | ICD-10-CM | POA: Diagnosis not present

## 2016-04-03 DIAGNOSIS — E785 Hyperlipidemia, unspecified: Secondary | ICD-10-CM | POA: Diagnosis not present

## 2016-04-03 DIAGNOSIS — R6510 Systemic inflammatory response syndrome (SIRS) of non-infectious origin without acute organ dysfunction: Secondary | ICD-10-CM | POA: Diagnosis not present

## 2016-04-03 DIAGNOSIS — N189 Chronic kidney disease, unspecified: Secondary | ICD-10-CM | POA: Diagnosis not present

## 2016-04-03 DIAGNOSIS — J181 Lobar pneumonia, unspecified organism: Secondary | ICD-10-CM | POA: Diagnosis not present

## 2016-04-03 DIAGNOSIS — Z7984 Long term (current) use of oral hypoglycemic drugs: Secondary | ICD-10-CM | POA: Diagnosis not present

## 2016-04-03 DIAGNOSIS — J121 Respiratory syncytial virus pneumonia: Secondary | ICD-10-CM | POA: Diagnosis not present

## 2016-04-03 DIAGNOSIS — F319 Bipolar disorder, unspecified: Secondary | ICD-10-CM | POA: Diagnosis not present

## 2016-04-03 DIAGNOSIS — N179 Acute kidney failure, unspecified: Secondary | ICD-10-CM | POA: Diagnosis not present

## 2016-04-03 DIAGNOSIS — E440 Moderate protein-calorie malnutrition: Secondary | ICD-10-CM | POA: Diagnosis not present

## 2016-04-03 DIAGNOSIS — E539 Vitamin B deficiency, unspecified: Secondary | ICD-10-CM | POA: Diagnosis not present

## 2016-04-03 DIAGNOSIS — Z7982 Long term (current) use of aspirin: Secondary | ICD-10-CM | POA: Diagnosis not present

## 2016-04-03 DIAGNOSIS — I129 Hypertensive chronic kidney disease with stage 1 through stage 4 chronic kidney disease, or unspecified chronic kidney disease: Secondary | ICD-10-CM | POA: Diagnosis not present

## 2016-04-03 DIAGNOSIS — Z9981 Dependence on supplemental oxygen: Secondary | ICD-10-CM | POA: Diagnosis not present

## 2016-04-03 DIAGNOSIS — E1122 Type 2 diabetes mellitus with diabetic chronic kidney disease: Secondary | ICD-10-CM | POA: Diagnosis not present

## 2016-04-05 DIAGNOSIS — E539 Vitamin B deficiency, unspecified: Secondary | ICD-10-CM | POA: Diagnosis not present

## 2016-04-05 DIAGNOSIS — N189 Chronic kidney disease, unspecified: Secondary | ICD-10-CM | POA: Diagnosis not present

## 2016-04-05 DIAGNOSIS — E785 Hyperlipidemia, unspecified: Secondary | ICD-10-CM | POA: Diagnosis not present

## 2016-04-05 DIAGNOSIS — E1122 Type 2 diabetes mellitus with diabetic chronic kidney disease: Secondary | ICD-10-CM | POA: Diagnosis not present

## 2016-04-05 DIAGNOSIS — Z7982 Long term (current) use of aspirin: Secondary | ICD-10-CM | POA: Diagnosis not present

## 2016-04-05 DIAGNOSIS — E440 Moderate protein-calorie malnutrition: Secondary | ICD-10-CM | POA: Diagnosis not present

## 2016-04-05 DIAGNOSIS — J181 Lobar pneumonia, unspecified organism: Secondary | ICD-10-CM | POA: Diagnosis not present

## 2016-04-05 DIAGNOSIS — R6510 Systemic inflammatory response syndrome (SIRS) of non-infectious origin without acute organ dysfunction: Secondary | ICD-10-CM | POA: Diagnosis not present

## 2016-04-05 DIAGNOSIS — J121 Respiratory syncytial virus pneumonia: Secondary | ICD-10-CM | POA: Diagnosis not present

## 2016-04-05 DIAGNOSIS — N179 Acute kidney failure, unspecified: Secondary | ICD-10-CM | POA: Diagnosis not present

## 2016-04-05 DIAGNOSIS — Z9981 Dependence on supplemental oxygen: Secondary | ICD-10-CM | POA: Diagnosis not present

## 2016-04-05 DIAGNOSIS — F319 Bipolar disorder, unspecified: Secondary | ICD-10-CM | POA: Diagnosis not present

## 2016-04-05 DIAGNOSIS — I129 Hypertensive chronic kidney disease with stage 1 through stage 4 chronic kidney disease, or unspecified chronic kidney disease: Secondary | ICD-10-CM | POA: Diagnosis not present

## 2016-04-05 DIAGNOSIS — Z7984 Long term (current) use of oral hypoglycemic drugs: Secondary | ICD-10-CM | POA: Diagnosis not present

## 2016-04-06 DIAGNOSIS — F3132 Bipolar disorder, current episode depressed, moderate: Secondary | ICD-10-CM | POA: Diagnosis not present

## 2016-04-09 DIAGNOSIS — J210 Acute bronchiolitis due to respiratory syncytial virus: Secondary | ICD-10-CM | POA: Diagnosis not present

## 2016-04-09 DIAGNOSIS — E440 Moderate protein-calorie malnutrition: Secondary | ICD-10-CM | POA: Diagnosis not present

## 2016-04-09 DIAGNOSIS — Z7984 Long term (current) use of oral hypoglycemic drugs: Secondary | ICD-10-CM | POA: Diagnosis not present

## 2016-04-09 DIAGNOSIS — E1142 Type 2 diabetes mellitus with diabetic polyneuropathy: Secondary | ICD-10-CM | POA: Diagnosis not present

## 2016-04-09 DIAGNOSIS — I129 Hypertensive chronic kidney disease with stage 1 through stage 4 chronic kidney disease, or unspecified chronic kidney disease: Secondary | ICD-10-CM | POA: Diagnosis not present

## 2016-04-09 DIAGNOSIS — E1122 Type 2 diabetes mellitus with diabetic chronic kidney disease: Secondary | ICD-10-CM | POA: Diagnosis not present

## 2016-04-09 DIAGNOSIS — N183 Chronic kidney disease, stage 3 (moderate): Secondary | ICD-10-CM | POA: Diagnosis not present

## 2016-04-09 DIAGNOSIS — K219 Gastro-esophageal reflux disease without esophagitis: Secondary | ICD-10-CM | POA: Diagnosis not present

## 2016-04-10 DIAGNOSIS — E440 Moderate protein-calorie malnutrition: Secondary | ICD-10-CM | POA: Diagnosis not present

## 2016-04-10 DIAGNOSIS — J181 Lobar pneumonia, unspecified organism: Secondary | ICD-10-CM | POA: Diagnosis not present

## 2016-04-10 DIAGNOSIS — N189 Chronic kidney disease, unspecified: Secondary | ICD-10-CM | POA: Diagnosis not present

## 2016-04-10 DIAGNOSIS — F319 Bipolar disorder, unspecified: Secondary | ICD-10-CM | POA: Diagnosis not present

## 2016-04-10 DIAGNOSIS — R6510 Systemic inflammatory response syndrome (SIRS) of non-infectious origin without acute organ dysfunction: Secondary | ICD-10-CM | POA: Diagnosis not present

## 2016-04-10 DIAGNOSIS — E539 Vitamin B deficiency, unspecified: Secondary | ICD-10-CM | POA: Diagnosis not present

## 2016-04-10 DIAGNOSIS — Z7984 Long term (current) use of oral hypoglycemic drugs: Secondary | ICD-10-CM | POA: Diagnosis not present

## 2016-04-10 DIAGNOSIS — J121 Respiratory syncytial virus pneumonia: Secondary | ICD-10-CM | POA: Diagnosis not present

## 2016-04-10 DIAGNOSIS — I129 Hypertensive chronic kidney disease with stage 1 through stage 4 chronic kidney disease, or unspecified chronic kidney disease: Secondary | ICD-10-CM | POA: Diagnosis not present

## 2016-04-10 DIAGNOSIS — E785 Hyperlipidemia, unspecified: Secondary | ICD-10-CM | POA: Diagnosis not present

## 2016-04-10 DIAGNOSIS — E1122 Type 2 diabetes mellitus with diabetic chronic kidney disease: Secondary | ICD-10-CM | POA: Diagnosis not present

## 2016-04-10 DIAGNOSIS — N179 Acute kidney failure, unspecified: Secondary | ICD-10-CM | POA: Diagnosis not present

## 2016-04-10 DIAGNOSIS — Z7982 Long term (current) use of aspirin: Secondary | ICD-10-CM | POA: Diagnosis not present

## 2016-04-10 DIAGNOSIS — Z9981 Dependence on supplemental oxygen: Secondary | ICD-10-CM | POA: Diagnosis not present

## 2016-04-12 DIAGNOSIS — R6510 Systemic inflammatory response syndrome (SIRS) of non-infectious origin without acute organ dysfunction: Secondary | ICD-10-CM | POA: Diagnosis not present

## 2016-04-12 DIAGNOSIS — F319 Bipolar disorder, unspecified: Secondary | ICD-10-CM | POA: Diagnosis not present

## 2016-04-12 DIAGNOSIS — E539 Vitamin B deficiency, unspecified: Secondary | ICD-10-CM | POA: Diagnosis not present

## 2016-04-12 DIAGNOSIS — E1122 Type 2 diabetes mellitus with diabetic chronic kidney disease: Secondary | ICD-10-CM | POA: Diagnosis not present

## 2016-04-12 DIAGNOSIS — I129 Hypertensive chronic kidney disease with stage 1 through stage 4 chronic kidney disease, or unspecified chronic kidney disease: Secondary | ICD-10-CM | POA: Diagnosis not present

## 2016-04-12 DIAGNOSIS — J181 Lobar pneumonia, unspecified organism: Secondary | ICD-10-CM | POA: Diagnosis not present

## 2016-04-12 DIAGNOSIS — Z9981 Dependence on supplemental oxygen: Secondary | ICD-10-CM | POA: Diagnosis not present

## 2016-04-12 DIAGNOSIS — Z7982 Long term (current) use of aspirin: Secondary | ICD-10-CM | POA: Diagnosis not present

## 2016-04-12 DIAGNOSIS — N189 Chronic kidney disease, unspecified: Secondary | ICD-10-CM | POA: Diagnosis not present

## 2016-04-12 DIAGNOSIS — Z7984 Long term (current) use of oral hypoglycemic drugs: Secondary | ICD-10-CM | POA: Diagnosis not present

## 2016-04-12 DIAGNOSIS — J121 Respiratory syncytial virus pneumonia: Secondary | ICD-10-CM | POA: Diagnosis not present

## 2016-04-12 DIAGNOSIS — N179 Acute kidney failure, unspecified: Secondary | ICD-10-CM | POA: Diagnosis not present

## 2016-04-12 DIAGNOSIS — E440 Moderate protein-calorie malnutrition: Secondary | ICD-10-CM | POA: Diagnosis not present

## 2016-04-12 DIAGNOSIS — E785 Hyperlipidemia, unspecified: Secondary | ICD-10-CM | POA: Diagnosis not present

## 2016-04-17 DIAGNOSIS — J181 Lobar pneumonia, unspecified organism: Secondary | ICD-10-CM | POA: Diagnosis not present

## 2016-04-17 DIAGNOSIS — N179 Acute kidney failure, unspecified: Secondary | ICD-10-CM | POA: Diagnosis not present

## 2016-04-17 DIAGNOSIS — E1122 Type 2 diabetes mellitus with diabetic chronic kidney disease: Secondary | ICD-10-CM | POA: Diagnosis not present

## 2016-04-17 DIAGNOSIS — I129 Hypertensive chronic kidney disease with stage 1 through stage 4 chronic kidney disease, or unspecified chronic kidney disease: Secondary | ICD-10-CM | POA: Diagnosis not present

## 2016-04-17 DIAGNOSIS — J121 Respiratory syncytial virus pneumonia: Secondary | ICD-10-CM | POA: Diagnosis not present

## 2016-04-17 DIAGNOSIS — Z9981 Dependence on supplemental oxygen: Secondary | ICD-10-CM | POA: Diagnosis not present

## 2016-04-17 DIAGNOSIS — E539 Vitamin B deficiency, unspecified: Secondary | ICD-10-CM | POA: Diagnosis not present

## 2016-04-17 DIAGNOSIS — N189 Chronic kidney disease, unspecified: Secondary | ICD-10-CM | POA: Diagnosis not present

## 2016-04-17 DIAGNOSIS — E440 Moderate protein-calorie malnutrition: Secondary | ICD-10-CM | POA: Diagnosis not present

## 2016-04-17 DIAGNOSIS — F319 Bipolar disorder, unspecified: Secondary | ICD-10-CM | POA: Diagnosis not present

## 2016-04-17 DIAGNOSIS — E785 Hyperlipidemia, unspecified: Secondary | ICD-10-CM | POA: Diagnosis not present

## 2016-04-17 DIAGNOSIS — R6510 Systemic inflammatory response syndrome (SIRS) of non-infectious origin without acute organ dysfunction: Secondary | ICD-10-CM | POA: Diagnosis not present

## 2016-04-17 DIAGNOSIS — Z7984 Long term (current) use of oral hypoglycemic drugs: Secondary | ICD-10-CM | POA: Diagnosis not present

## 2016-04-17 DIAGNOSIS — Z7982 Long term (current) use of aspirin: Secondary | ICD-10-CM | POA: Diagnosis not present

## 2016-07-13 DIAGNOSIS — F3132 Bipolar disorder, current episode depressed, moderate: Secondary | ICD-10-CM | POA: Diagnosis not present

## 2016-07-17 DIAGNOSIS — H353211 Exudative age-related macular degeneration, right eye, with active choroidal neovascularization: Secondary | ICD-10-CM | POA: Diagnosis not present

## 2016-07-17 DIAGNOSIS — H3562 Retinal hemorrhage, left eye: Secondary | ICD-10-CM | POA: Diagnosis not present

## 2016-07-17 DIAGNOSIS — H5316 Psychophysical visual disturbances: Secondary | ICD-10-CM | POA: Diagnosis not present

## 2016-07-17 DIAGNOSIS — H353221 Exudative age-related macular degeneration, left eye, with active choroidal neovascularization: Secondary | ICD-10-CM | POA: Diagnosis not present

## 2016-07-17 DIAGNOSIS — D3132 Benign neoplasm of left choroid: Secondary | ICD-10-CM | POA: Diagnosis not present

## 2016-07-24 DIAGNOSIS — H01001 Unspecified blepharitis right upper eyelid: Secondary | ICD-10-CM | POA: Diagnosis not present

## 2016-07-24 DIAGNOSIS — H5319 Other subjective visual disturbances: Secondary | ICD-10-CM | POA: Diagnosis not present

## 2016-07-24 DIAGNOSIS — H353231 Exudative age-related macular degeneration, bilateral, with active choroidal neovascularization: Secondary | ICD-10-CM | POA: Diagnosis not present

## 2016-07-24 DIAGNOSIS — H01002 Unspecified blepharitis right lower eyelid: Secondary | ICD-10-CM | POA: Diagnosis not present

## 2016-07-26 DIAGNOSIS — H353113 Nonexudative age-related macular degeneration, right eye, advanced atrophic without subfoveal involvement: Secondary | ICD-10-CM | POA: Diagnosis not present

## 2016-07-26 DIAGNOSIS — H35721 Serous detachment of retinal pigment epithelium, right eye: Secondary | ICD-10-CM | POA: Diagnosis not present

## 2016-07-26 DIAGNOSIS — H5316 Psychophysical visual disturbances: Secondary | ICD-10-CM | POA: Diagnosis not present

## 2016-07-26 DIAGNOSIS — H3561 Retinal hemorrhage, right eye: Secondary | ICD-10-CM | POA: Diagnosis not present

## 2016-07-26 DIAGNOSIS — H353211 Exudative age-related macular degeneration, right eye, with active choroidal neovascularization: Secondary | ICD-10-CM | POA: Diagnosis not present

## 2016-07-26 DIAGNOSIS — H353221 Exudative age-related macular degeneration, left eye, with active choroidal neovascularization: Secondary | ICD-10-CM | POA: Diagnosis not present

## 2016-08-01 DIAGNOSIS — H353221 Exudative age-related macular degeneration, left eye, with active choroidal neovascularization: Secondary | ICD-10-CM | POA: Diagnosis not present

## 2016-08-03 DIAGNOSIS — H3530 Unspecified macular degeneration: Secondary | ICD-10-CM

## 2016-08-03 HISTORY — DX: Unspecified macular degeneration: H35.30

## 2016-08-08 ENCOUNTER — Emergency Department (HOSPITAL_COMMUNITY): Payer: PPO

## 2016-08-08 ENCOUNTER — Inpatient Hospital Stay (HOSPITAL_COMMUNITY)
Admission: EM | Admit: 2016-08-08 | Discharge: 2016-08-18 | DRG: 872 | Disposition: A | Discharge status: Home Health | Payer: PPO | Attending: Internal Medicine | Admitting: Internal Medicine

## 2016-08-08 ENCOUNTER — Encounter (HOSPITAL_COMMUNITY): Payer: Self-pay | Admitting: Emergency Medicine

## 2016-08-08 DIAGNOSIS — Z96652 Presence of left artificial knee joint: Secondary | ICD-10-CM | POA: Diagnosis present

## 2016-08-08 DIAGNOSIS — N183 Chronic kidney disease, stage 3 unspecified: Secondary | ICD-10-CM | POA: Diagnosis present

## 2016-08-08 DIAGNOSIS — M797 Fibromyalgia: Secondary | ICD-10-CM | POA: Diagnosis not present

## 2016-08-08 DIAGNOSIS — Z79899 Other long term (current) drug therapy: Secondary | ICD-10-CM

## 2016-08-08 DIAGNOSIS — R0602 Shortness of breath: Secondary | ICD-10-CM | POA: Diagnosis not present

## 2016-08-08 DIAGNOSIS — A419 Sepsis, unspecified organism: Secondary | ICD-10-CM | POA: Diagnosis present

## 2016-08-08 DIAGNOSIS — I272 Pulmonary hypertension, unspecified: Secondary | ICD-10-CM | POA: Diagnosis not present

## 2016-08-08 DIAGNOSIS — R1010 Upper abdominal pain, unspecified: Secondary | ICD-10-CM | POA: Diagnosis not present

## 2016-08-08 DIAGNOSIS — I5032 Chronic diastolic (congestive) heart failure: Secondary | ICD-10-CM | POA: Diagnosis not present

## 2016-08-08 DIAGNOSIS — K922 Gastrointestinal hemorrhage, unspecified: Secondary | ICD-10-CM | POA: Diagnosis not present

## 2016-08-08 DIAGNOSIS — T85520A Displacement of bile duct prosthesis, initial encounter: Secondary | ICD-10-CM | POA: Diagnosis not present

## 2016-08-08 DIAGNOSIS — E872 Acidosis, unspecified: Secondary | ICD-10-CM

## 2016-08-08 DIAGNOSIS — I130 Hypertensive heart and chronic kidney disease with heart failure and stage 1 through stage 4 chronic kidney disease, or unspecified chronic kidney disease: Secondary | ICD-10-CM | POA: Diagnosis not present

## 2016-08-08 DIAGNOSIS — E1165 Type 2 diabetes mellitus with hyperglycemia: Secondary | ICD-10-CM | POA: Diagnosis present

## 2016-08-08 DIAGNOSIS — K8309 Other cholangitis: Secondary | ICD-10-CM

## 2016-08-08 DIAGNOSIS — E785 Hyperlipidemia, unspecified: Secondary | ICD-10-CM | POA: Diagnosis present

## 2016-08-08 DIAGNOSIS — K830 Cholangitis: Secondary | ICD-10-CM | POA: Diagnosis present

## 2016-08-08 DIAGNOSIS — Z96642 Presence of left artificial hip joint: Secondary | ICD-10-CM | POA: Diagnosis present

## 2016-08-08 DIAGNOSIS — H3530 Unspecified macular degeneration: Secondary | ICD-10-CM | POA: Diagnosis present

## 2016-08-08 DIAGNOSIS — R7989 Other specified abnormal findings of blood chemistry: Secondary | ICD-10-CM | POA: Diagnosis present

## 2016-08-08 DIAGNOSIS — R740 Nonspecific elevation of levels of transaminase and lactic acid dehydrogenase [LDH]: Secondary | ICD-10-CM | POA: Diagnosis present

## 2016-08-08 DIAGNOSIS — F319 Bipolar disorder, unspecified: Secondary | ICD-10-CM | POA: Diagnosis not present

## 2016-08-08 DIAGNOSIS — D120 Benign neoplasm of cecum: Secondary | ICD-10-CM | POA: Diagnosis present

## 2016-08-08 DIAGNOSIS — Z7982 Long term (current) use of aspirin: Secondary | ICD-10-CM

## 2016-08-08 DIAGNOSIS — E876 Hypokalemia: Secondary | ICD-10-CM | POA: Diagnosis not present

## 2016-08-08 DIAGNOSIS — Z9049 Acquired absence of other specified parts of digestive tract: Secondary | ICD-10-CM

## 2016-08-08 DIAGNOSIS — F3113 Bipolar disorder, current episode manic without psychotic features, severe: Secondary | ICD-10-CM | POA: Diagnosis not present

## 2016-08-08 DIAGNOSIS — K921 Melena: Secondary | ICD-10-CM | POA: Diagnosis not present

## 2016-08-08 DIAGNOSIS — A4159 Other Gram-negative sepsis: Principal | ICD-10-CM | POA: Diagnosis present

## 2016-08-08 DIAGNOSIS — G8929 Other chronic pain: Secondary | ICD-10-CM | POA: Diagnosis not present

## 2016-08-08 DIAGNOSIS — N3010 Interstitial cystitis (chronic) without hematuria: Secondary | ICD-10-CM | POA: Diagnosis not present

## 2016-08-08 DIAGNOSIS — Y838 Other surgical procedures as the cause of abnormal reaction of the patient, or of later complication, without mention of misadventure at the time of the procedure: Secondary | ICD-10-CM | POA: Diagnosis not present

## 2016-08-08 DIAGNOSIS — E1122 Type 2 diabetes mellitus with diabetic chronic kidney disease: Secondary | ICD-10-CM | POA: Diagnosis present

## 2016-08-08 DIAGNOSIS — R7881 Bacteremia: Secondary | ICD-10-CM | POA: Diagnosis not present

## 2016-08-08 DIAGNOSIS — I361 Nonrheumatic tricuspid (valve) insufficiency: Secondary | ICD-10-CM | POA: Diagnosis not present

## 2016-08-08 DIAGNOSIS — R7401 Elevation of levels of liver transaminase levels: Secondary | ICD-10-CM

## 2016-08-08 DIAGNOSIS — Z8261 Family history of arthritis: Secondary | ICD-10-CM

## 2016-08-08 DIAGNOSIS — Z7984 Long term (current) use of oral hypoglycemic drugs: Secondary | ICD-10-CM

## 2016-08-08 DIAGNOSIS — Z66 Do not resuscitate: Secondary | ICD-10-CM | POA: Diagnosis present

## 2016-08-08 DIAGNOSIS — Z885 Allergy status to narcotic agent status: Secondary | ICD-10-CM

## 2016-08-08 DIAGNOSIS — E118 Type 2 diabetes mellitus with unspecified complications: Secondary | ICD-10-CM | POA: Diagnosis not present

## 2016-08-08 DIAGNOSIS — E669 Obesity, unspecified: Secondary | ICD-10-CM | POA: Diagnosis present

## 2016-08-08 DIAGNOSIS — K5750 Diverticulosis of both small and large intestine without perforation or abscess without bleeding: Secondary | ICD-10-CM | POA: Diagnosis present

## 2016-08-08 DIAGNOSIS — F310 Bipolar disorder, current episode hypomanic: Secondary | ICD-10-CM | POA: Diagnosis not present

## 2016-08-08 DIAGNOSIS — D62 Acute posthemorrhagic anemia: Secondary | ICD-10-CM | POA: Diagnosis not present

## 2016-08-08 DIAGNOSIS — Z8249 Family history of ischemic heart disease and other diseases of the circulatory system: Secondary | ICD-10-CM

## 2016-08-08 DIAGNOSIS — K219 Gastro-esophageal reflux disease without esophagitis: Secondary | ICD-10-CM | POA: Diagnosis present

## 2016-08-08 DIAGNOSIS — M545 Low back pain: Secondary | ICD-10-CM | POA: Diagnosis not present

## 2016-08-08 DIAGNOSIS — Z22322 Carrier or suspected carrier of Methicillin resistant Staphylococcus aureus: Secondary | ICD-10-CM

## 2016-08-08 DIAGNOSIS — I248 Other forms of acute ischemic heart disease: Secondary | ICD-10-CM | POA: Diagnosis not present

## 2016-08-08 DIAGNOSIS — A414 Sepsis due to anaerobes: Secondary | ICD-10-CM | POA: Diagnosis not present

## 2016-08-08 DIAGNOSIS — N179 Acute kidney failure, unspecified: Secondary | ICD-10-CM | POA: Diagnosis present

## 2016-08-08 DIAGNOSIS — I10 Essential (primary) hypertension: Secondary | ICD-10-CM | POA: Diagnosis present

## 2016-08-08 DIAGNOSIS — Z419 Encounter for procedure for purposes other than remedying health state, unspecified: Secondary | ICD-10-CM

## 2016-08-08 DIAGNOSIS — K811 Chronic cholecystitis: Secondary | ICD-10-CM | POA: Diagnosis not present

## 2016-08-08 DIAGNOSIS — K625 Hemorrhage of anus and rectum: Secondary | ICD-10-CM

## 2016-08-08 DIAGNOSIS — R072 Precordial pain: Secondary | ICD-10-CM | POA: Diagnosis not present

## 2016-08-08 DIAGNOSIS — R778 Other specified abnormalities of plasma proteins: Secondary | ICD-10-CM

## 2016-08-08 DIAGNOSIS — K640 First degree hemorrhoids: Secondary | ICD-10-CM | POA: Diagnosis present

## 2016-08-08 DIAGNOSIS — B961 Klebsiella pneumoniae [K. pneumoniae] as the cause of diseases classified elsewhere: Secondary | ICD-10-CM | POA: Diagnosis present

## 2016-08-08 DIAGNOSIS — R748 Abnormal levels of other serum enzymes: Secondary | ICD-10-CM | POA: Diagnosis not present

## 2016-08-08 DIAGNOSIS — Z6828 Body mass index (BMI) 28.0-28.9, adult: Secondary | ICD-10-CM

## 2016-08-08 DIAGNOSIS — R0600 Dyspnea, unspecified: Secondary | ICD-10-CM | POA: Diagnosis not present

## 2016-08-08 DIAGNOSIS — N281 Cyst of kidney, acquired: Secondary | ICD-10-CM | POA: Diagnosis not present

## 2016-08-08 DIAGNOSIS — E1121 Type 2 diabetes mellitus with diabetic nephropathy: Secondary | ICD-10-CM | POA: Diagnosis not present

## 2016-08-08 DIAGNOSIS — D122 Benign neoplasm of ascending colon: Secondary | ICD-10-CM | POA: Diagnosis present

## 2016-08-08 DIAGNOSIS — R079 Chest pain, unspecified: Secondary | ICD-10-CM

## 2016-08-08 DIAGNOSIS — R109 Unspecified abdominal pain: Secondary | ICD-10-CM

## 2016-08-08 HISTORY — DX: Unspecified macular degeneration: H35.30

## 2016-08-08 LAB — BASIC METABOLIC PANEL
ANION GAP: 15 (ref 5–15)
BUN: 32 mg/dL — ABNORMAL HIGH (ref 6–20)
CALCIUM: 9.6 mg/dL (ref 8.9–10.3)
CHLORIDE: 96 mmol/L — AB (ref 101–111)
CO2: 24 mmol/L (ref 22–32)
Creatinine, Ser: 1.24 mg/dL — ABNORMAL HIGH (ref 0.44–1.00)
GFR calc non Af Amer: 38 mL/min — ABNORMAL LOW (ref >60)
GFR, EST AFRICAN AMERICAN: 45 mL/min — AB (ref >60)
GLUCOSE: 271 mg/dL — AB (ref 65–99)
Potassium: 3.9 mmol/L (ref 3.5–5.1)
Sodium: 135 mmol/L (ref 135–145)

## 2016-08-08 LAB — HEPATIC FUNCTION PANEL
ALK PHOS: 423 U/L — AB (ref 38–126)
ALT: 1484 U/L — AB (ref 14–54)
AST: 2610 U/L — AB (ref 15–41)
Albumin: 3.8 g/dL (ref 3.5–5.0)
BILIRUBIN DIRECT: 1.6 mg/dL — AB (ref 0.1–0.5)
BILIRUBIN TOTAL: 2.6 mg/dL — AB (ref 0.3–1.2)
Indirect Bilirubin: 1 mg/dL — ABNORMAL HIGH (ref 0.3–0.9)
Total Protein: 7.4 g/dL (ref 6.5–8.1)

## 2016-08-08 LAB — URINALYSIS, ROUTINE W REFLEX MICROSCOPIC
BILIRUBIN URINE: NEGATIVE
Bacteria, UA: NONE SEEN
GLUCOSE, UA: 150 mg/dL — AB
HGB URINE DIPSTICK: NEGATIVE
KETONES UR: NEGATIVE mg/dL
LEUKOCYTES UA: NEGATIVE
NITRITE: NEGATIVE
PH: 5 (ref 5.0–8.0)
PROTEIN: 30 mg/dL — AB
Specific Gravity, Urine: 1.014 (ref 1.005–1.030)

## 2016-08-08 LAB — DIFFERENTIAL
Basophils Absolute: 0 10*3/uL (ref 0.0–0.1)
Basophils Relative: 0 %
EOS PCT: 0 %
Eosinophils Absolute: 0 10*3/uL (ref 0.0–0.7)
LYMPHS ABS: 0.8 10*3/uL (ref 0.7–4.0)
Lymphocytes Relative: 8 %
MONO ABS: 0.6 10*3/uL (ref 0.1–1.0)
MONOS PCT: 7 %
NEUTROS PCT: 85 %
Neutro Abs: 7.9 10*3/uL — ABNORMAL HIGH (ref 1.7–7.7)

## 2016-08-08 LAB — I-STAT CG4 LACTIC ACID, ED
Lactic Acid, Venous: 4.08 mmol/L (ref 0.5–1.9)
Lactic Acid, Venous: 4.8 mmol/L (ref 0.5–1.9)

## 2016-08-08 LAB — CBC
HCT: 40.3 % (ref 36.0–46.0)
HEMOGLOBIN: 13 g/dL (ref 12.0–15.0)
MCH: 29.2 pg (ref 26.0–34.0)
MCHC: 32.3 g/dL (ref 30.0–36.0)
MCV: 90.6 fL (ref 78.0–100.0)
Platelets: 214 10*3/uL (ref 150–400)
RBC: 4.45 MIL/uL (ref 3.87–5.11)
RDW: 14.1 % (ref 11.5–15.5)
WBC: 9.5 10*3/uL (ref 4.0–10.5)

## 2016-08-08 LAB — I-STAT TROPONIN, ED
Troponin i, poc: 0 ng/mL (ref 0.00–0.08)
Troponin i, poc: 0.29 ng/mL (ref 0.00–0.08)

## 2016-08-08 LAB — BRAIN NATRIURETIC PEPTIDE: B Natriuretic Peptide: 35.2 pg/mL (ref 0.0–100.0)

## 2016-08-08 LAB — PROTIME-INR
INR: 1.13
PROTHROMBIN TIME: 14.5 s (ref 11.4–15.2)

## 2016-08-08 LAB — LIPASE, BLOOD: Lipase: 42 U/L (ref 11–51)

## 2016-08-08 LAB — ACETAMINOPHEN LEVEL: Acetaminophen (Tylenol), Serum: <10 ug/mL — ABNORMAL LOW (ref 10–30)

## 2016-08-08 MED ORDER — SODIUM CHLORIDE 0.9 % IV BOLUS (SEPSIS)
1000.0000 mL | Freq: Once | INTRAVENOUS | Status: AC
  Administered 2016-08-08: 1000 mL via INTRAVENOUS

## 2016-08-08 MED ORDER — PIPERACILLIN-TAZOBACTAM 3.375 G IVPB 30 MIN
3.3750 g | Freq: Once | INTRAVENOUS | Status: AC
  Administered 2016-08-08: 3.375 g via INTRAVENOUS
  Filled 2016-08-08: qty 50

## 2016-08-08 MED ORDER — PIPERACILLIN-TAZOBACTAM 3.375 G IVPB
3.3750 g | Freq: Three times a day (TID) | INTRAVENOUS | Status: DC
  Administered 2016-08-09 – 2016-08-12 (×9): 3.375 g via INTRAVENOUS
  Filled 2016-08-08 (×17): qty 50

## 2016-08-08 MED ORDER — VANCOMYCIN HCL IN DEXTROSE 1-5 GM/200ML-% IV SOLN
1000.0000 mg | Freq: Once | INTRAVENOUS | Status: AC
  Administered 2016-08-08: 1000 mg via INTRAVENOUS
  Filled 2016-08-08: qty 200

## 2016-08-08 MED ORDER — NITROGLYCERIN 0.4 MG SL SUBL
0.4000 mg | SUBLINGUAL_TABLET | SUBLINGUAL | Status: DC | PRN
  Administered 2016-08-08: 0.4 mg via SUBLINGUAL
  Filled 2016-08-08: qty 1

## 2016-08-08 MED ORDER — VANCOMYCIN HCL 500 MG IV SOLR: 500.0000 mg | INTRAVENOUS | Status: DC

## 2016-08-08 MED ORDER — ONDANSETRON HCL 4 MG/2ML IJ SOLN
4.0000 mg | Freq: Once | INTRAMUSCULAR | Status: AC
  Administered 2016-08-08: 4 mg via INTRAVENOUS
  Filled 2016-08-08: qty 2

## 2016-08-08 MED ORDER — IOPAMIDOL (ISOVUE-370) INJECTION 76%
INTRAVENOUS | Status: AC
  Administered 2016-08-08: 100 mL
  Filled 2016-08-08: qty 100

## 2016-08-08 MED ORDER — FENTANYL CITRATE (PF) 100 MCG/2ML IJ SOLN
50.0000 ug | Freq: Once | INTRAMUSCULAR | Status: AC
  Administered 2016-08-08: 50 ug via INTRAVENOUS
  Filled 2016-08-08: qty 2

## 2016-08-08 NOTE — ED Notes (Signed)
Pt noted to have oxygen saturations at 87% with good pleth when pt returned from CT. Pt placed on 2L [place] and saturations improved to 95%. MD notified. Will cont to monitor.

## 2016-08-08 NOTE — ED Notes (Signed)
Patient transported to CT 

## 2016-08-08 NOTE — ED Notes (Signed)
Lactic Acid 4.08

## 2016-08-08 NOTE — ED Provider Notes (Signed)
[facility] DEPT Provider Note   CSN: [id number] Arrival date & time: 08/08/16  1836     History   Chief Complaint Chief Complaint  Patient presents with  . Chest Pain    HPI [name] is a 83 y.o. female.  HPI  83 year old female presents with concern for epigastric and substernal chest pain. Reports that she had some yesterday, that it improved, then at 3 PM today it returned. Reports the pain is 9 out of 10, sharp, associated with shortness of breath and nausea. Reports she's had chills at home but denies any known fevers. Has had nausea and vomiting. No diarrhea. Denies cough, urinary symptoms, rash.  Daughter reports this is similar to prior presentation for gallbladder pathology. Patient points to substernal chest as area for pain. Hx of fibromyalgia with diffuse baseline tenderness.    Daughter reports she is DNR/DNI but would want surgery/procedures if they were to help her.   Past Medical History:  Diagnosis Date  . Chest pain    a. 07/2014 Neg MV, nl EF.  Marland Kitchen Chronic interstitial cystitis   . Chronic low back pain   . CKD (chronic kidney disease), stage III   . Diabetes mellitus without complication ([place])   . GERD (gastroesophageal reflux disease)   . Hyperlipidemia   . Hypertension   . Macular degeneration   . Microhematuria   . Osteopenia   . Pernicious anemia   . Postlaminectomy syndrome   . PVC (premature ventricular contraction)   . Right carpal tunnel syndrome     Patient Active Problem List   Diagnosis Date Noted  . RSV (respiratory syncytial virus pneumonia) 03/15/2016  . Lobar pneumonia, unspecified organism ([place]) 03/15/2016  . SIRS (systemic inflammatory response syndrome) ([place]) 03/15/2016  . Hypokalemia 03/15/2016  . Mild protein-calorie malnutrition ([place]) 03/13/2016  . Bipolar disorder ([place]) 03/13/2016  . Diabetes mellitus with complication ([place])   . Dilated cbd, acquired 01/03/2016  . Abdominal pain, epigastric 01/03/2016  . Nausea  without vomiting 01/03/2016  . Abnormal LFTs 12/18/2015  . Depression 12/18/2015  . Pancreatitis, acute 02/13/2015  . Acute kidney injury superimposed on chronic kidney disease ([place]) 02/13/2015  . Cholecystitis 02/03/2015  . Sepsis ([place]) 02/03/2015  . Essential hypertension, malignant 01/14/2015  . CHF (congestive heart failure) ([place]) 01/14/2015  . Chronic diastolic congestive heart failure ([place]) 01/13/2015  . Atrial flutter, paroxysmal ([place]) 01/13/2015  . Acute respiratory failure with hypoxia ([place]) 01/13/2015  . Lingular mass 01/13/2015  . Ulcer of right heel ([place]) 01/13/2015  . Acute diastolic heart failure, NYHA class 1 ([place]) 01/13/2015  . Hypertensive emergency   . Acute encephalopathy 11/19/2014  . UTI (urinary tract infection) 11/19/2014  . TIA (transient ischemic attack) 11/18/2014  . Obesity (BMI 35.0-39.9 without comorbidity) ([place]) 11/18/2014  . ALT (SGPT) level raised 11/18/2014  . CKD (chronic kidney disease), stage III   . Essential hypertension 07/18/2014  . Type 2 diabetes mellitus with hyperglycemia ([place]) 07/18/2014  . Hyperlipidemia 07/18/2014    Past Surgical History:  Procedure Laterality Date  . BACK SURGERY    . CHOLECYSTECTOMY N/A 12/21/2015   Procedure: LAPAROSCOPIC CHOLECYSTECTOMY WITH INTRAOPERATIVE CHOLANGIOGRAM;  Surgeon: Alphonsa Overall, MD;  Location: [facility];  Service: General;  Laterality: N/A;  . EUS N/A 02/23/2015   Procedure: ESOPHAGEAL ENDOSCOPIC ULTRASOUND (EUS) RADIAL;  Surgeon: Arta Silence, MD;  Location: [facility];  Service: Endoscopy;  Laterality: N/A;  . JOINT REPLACEMENT Left    hip and knee    OB History  No data available       Home Medications    Prior to Admission medications   Medication Sig Start Date End Date Taking? Authorizing Provider  acetaminophen (TYLENOL) 500 MG tablet Take 500 mg by mouth every 6 (six) hours as needed for mild pain.    [provider]  amLODipine (NORVASC) 5 MG tablet Take 1 tablet  (5 mg total) by mouth daily. 11/21/14   Rai, Vernelle Emerald, MD  aspirin EC 81 MG tablet Take 81 mg by mouth daily.    [provider]  cholecalciferol (VITAMIN D) 1000 UNITS tablet Take 2,000 Units by mouth daily.     [provider]  conjugated estrogens (PREMARIN) vaginal cream Place 1 Applicatorful vaginally daily as needed (burning).     [provider]  cyanocobalamin (,VITAMIN B-12,) 1000 MCG/ML injection Inject 1,000 mcg into the muscle every 30 (thirty) days.    [provider]  feeding supplement, ENSURE ENLIVE, (ENSURE ENLIVE) LIQD Take 237 mLs by mouth 2 (two) times daily between meals. 03/15/16   Dhungel, Flonnie Overman, MD  furosemide (LASIX) 20 MG tablet Take 2 tablets (40 mg total) by mouth daily. Patient taking differently: Take 20 mg by mouth daily.  02/08/15   Domenic Polite, MD  glimepiride (AMARYL) 1 MG tablet Take 2 tablets (2 mg total) by mouth every morning. 01/17/15   Charlynne Cousins, MD  guaiFENesin (ROBITUSSIN) 100 MG/5ML SOLN Take 5 mLs (100 mg total) by mouth every 4 (four) hours as needed for cough or to loosen phlegm. 03/15/16   Dhungel, Nishant, MD  lisinopril (PRINIVIL,ZESTRIL) 20 MG tablet Take 1 tablet (20 mg total) by mouth every evening. 07/22/14   Cristal Ford, DO  lithium carbonate 150 MG capsule Take 150 mg by mouth daily.    [provider]  metoprolol (LOPRESSOR) 50 MG tablet Take 1 tablet (50 mg total) by mouth 2 (two) times daily. 07/22/14   Mikhail, Velta Addison, DO  mirtazapine (REMERON) 30 MG tablet Take 30 mg by mouth at bedtime.     [provider]  nitroGLYCERIN (NITROSTAT) 0.4 MG SL tablet Place 1 tablet (0.4 mg total) under the tongue every 5 (five) minutes as needed for chest pain. 07/22/14   Mikhail, Velta Addison, DO  omeprazole (PRILOSEC) 40 MG capsule Take 40 mg by mouth every evening.     [provider]  pregabalin (LYRICA) 75 MG capsule Take 75 mg by mouth 2 (two) times daily.    [provider]  traMADol (ULTRAM) 50 MG tablet Take 1 tablet (50 mg total) by mouth 2 (two) times daily as needed (pain). Patient taking differently: Take 50 mg by mouth 2 (two) times daily.  07/22/14   Mikhail, Velta Addison, DO  Vilazodone HCl 20 MG TABS Take 20 mg by mouth daily. Viibryd    [provider]  zolpidem (AMBIEN) 5 MG tablet Take 2.5 mg by mouth at bedtime.     [provider]    Family History Family History  Problem Relation Age of Onset  . Arthritis Mother   . Heart attack Father     Social History Social History  Substance Use Topics  . Smoking status: Never Smoker  . Smokeless tobacco: Never Used  . Alcohol use No     Allergies   Codeine   Review of Systems Review of Systems  Constitutional: Positive for chills. Negative for fever.  HENT: Negative for sore throat.   Eyes: Negative for visual disturbance.  Respiratory: Positive for  shortness of breath. Negative for cough.   Cardiovascular: Positive for chest pain.  Gastrointestinal: Positive for abdominal pain, nausea and vomiting. Negative for constipation and diarrhea.  Genitourinary: Negative for difficulty urinating and dysuria.  Musculoskeletal: Negative for back pain and neck pain.  Skin: Negative for rash.  Neurological: Negative for syncope and headaches.     Physical Exam Updated Vital Signs BP (!) 135/42   Pulse 95   Temp (!) 102.3 F (39.1 C) (Rectal)   Resp (!) 36   Wt 59.9 kg (132 lb)   SpO2 98%   BMI 22.66 kg/m   Physical Exam  Constitutional: She is oriented to person, place, and time. She appears well-developed and well-nourished. She appears ill. No distress.  HENT:  Head: Normocephalic and atraumatic.  Eyes: Conjunctivae and EOM are normal.  Neck: Normal range of motion.  Cardiovascular: Regular rhythm, normal heart sounds and intact distal pulses.  Tachycardia present.  Exam reveals no gallop and no friction rub.   No murmur heard. Pulmonary/Chest: Effort  normal and breath sounds normal. No respiratory distress. She has no wheezes. She has no rales.  Abdominal: Soft. She exhibits no distension. There is tenderness (mild epigastric, daughter reports hx of tenderness due to fibromyalgia). There is no guarding.  Musculoskeletal: She exhibits no edema or tenderness.  Neurological: She is alert and oriented to person, place, and time. She displays tremor.  Skin: Skin is warm and dry. No rash noted. She is not diaphoretic. No erythema.  Nursing note and vitals reviewed.    ED Treatments / Results  Labs (all labs ordered are listed, but only abnormal results are displayed) Labs Reviewed  BASIC METABOLIC PANEL - Abnormal; Notable for the following:       Result Value   Chloride 96 (*)    Glucose, Bld 271 (*)    BUN 32 (*)    Creatinine, Ser 1.24 (*)    GFR calc non Af Amer 38 (*)    GFR calc Af Amer 45 (*)    All other components within normal limits  URINALYSIS, ROUTINE W REFLEX MICROSCOPIC - Abnormal; Notable for the following:    APPearance HAZY (*)    Glucose, UA 150 (*)    Protein, ur 30 (*)    Squamous Epithelial / LPF 0-5 (*)    All other components within normal limits  HEPATIC FUNCTION PANEL - Abnormal; Notable for the following:    AST 2,610 (*)    ALT 1,484 (*)    Alkaline Phosphatase 423 (*)    Total Bilirubin 2.6 (*)    Bilirubin, Direct 1.6 (*)    Indirect Bilirubin 1.0 (*)    All other components within normal limits  DIFFERENTIAL - Abnormal; Notable for the following:    Neutro Abs 7.9 (*)    All other components within normal limits  ACETAMINOPHEN LEVEL - Abnormal; Notable for the following:    Acetaminophen (Tylenol), Serum <10 (*)    All other components within normal limits  CK - Abnormal; Notable for the following:    Total CK 21 (*)    All other components within normal limits  TROPONIN I - Abnormal; Notable for the following:    Troponin I 0.41 (*)    All other components within normal limits  I-STAT CG4  LACTIC ACID, ED - Abnormal; Notable for the following:    Lactic Acid, Venous 4.08 (*)    All other components within normal limits  I-STAT CG4 LACTIC ACID, ED -  Abnormal; Notable for the following:    Lactic Acid, Venous 4.80 (*)    All other components within normal limits  I-STAT TROPOININ, ED - Abnormal; Notable for the following:    Troponin i, poc 0.29 (*)    All other components within normal limits  CULTURE, BLOOD (ROUTINE X 2)  CULTURE, BLOOD (ROUTINE X 2)  URINE CULTURE  CBC  BRAIN NATRIURETIC PEPTIDE  LIPASE, BLOOD  PROTIME-INR  HEPATITIS PANEL, ACUTE  LITHIUM LEVEL  I-STAT TROPOININ, ED  I-STAT CG4 LACTIC ACID, ED  I-STAT CG4 LACTIC ACID, ED    EKG  EKG Interpretation  Date/Time:  Wednesday August 08 2016 18:51:07 EDT Ventricular Rate:  163 PR Interval:    QRS Duration: 86 QT Interval:  186 QTC Calculation: 306 R Axis:   40 Text Interpretation:  Suspect atrial flutter Artifact secondary to patient tremors. Unclear rhythm with exam severely limited by tremor Abnormal ECG Confirmed by Gareth Morgan [phone]) on 08/09/2016 12:21:08 AM       Radiology Ct Abdomen Pelvis Wo Contrast  Result Date: 08/09/2016 CLINICAL DATA:  Dyspnea and nausea starting today. Elevated lactic acid levels. EXAM: CT ABDOMEN AND PELVIS WITHOUT CONTRAST TECHNIQUE: Multidetector CT imaging of the abdomen and pelvis was performed following the standard protocol without IV contrast. COMPARISON:  01/03/2016 CT and chest CT performed earlier on the same day FINDINGS: Lower chest: Borderline cardiomegaly with bibasilar atelectasis and/or scarring. No pericardial effusion. There is coronary arteriosclerosis. Hepatobiliary: Status post cholecystectomy with chronic stable dilatation of the intra and extrahepatic biliary ducts without calculus noted. The common bile duct is slightly more dilated on current exam up to 2.1 cm at the pancreatic head versus 1.7 cm in 2017. No mural thickening of the bowel ducts  is identified. Lack of IV contrast limits assessment for mucosal enhancement. Patient reportedly received IV contrast earlier for a chest CT. On that exam, no definite mucosal enhancement is noted. Pancreas: Stable 2.7 cm cystic lesion in the proximal body of the pancreas. No ductal dilatation is noted. Spleen: Normal in size without focal abnormality. Adrenals/Urinary Tract: Normal bilateral adrenal glands. Small left renal cysts are again visualized. Symmetric pyelograms noted bilaterally status post prior CT administration for the chest. No obstructive uropathy. Contrast opacified urine distended bladder. Stomach/Bowel: All the stomach and small intestine are normal. There is a moderate amount of fecal retention throughout large bowel with circular muscle hypertrophy and extensive sigmoid diverticulosis noted similar in appearance to prior. The appendix is not visualized but no pericecal inflammation is noted. Vascular/Lymphatic: Diffuse atherosclerosis of the abdominal aorta and branch vessels without aneurysm. No retroperitoneal or pelvic sidewall adenopathy. No inguinal lymphadenopathy. Reproductive: Uterus and bilateral adnexa are unremarkable. Other: No abdominal wall hernia or abnormality. No abdominopelvic ascites. Musculoskeletal: Thoracolumbar spondylosis with multilevel degenerative disc disease along the lumbar spine. Osteopenia is noted. No acute fracture nor bone destruction. IMPRESSION: 1. Coronary arteriosclerosis and aortic atherosclerosis. 2. Extensive sigmoid diverticulosis without acute diverticulitis. 3. Status post cholecystectomy with chronic intrahepatic and extrahepatic ductal dilatation without mural thickening or enhancement seen on recent same day CT with IV contrast. No conclusive findings of cholangitis. 4. Stable 2.7 cm pancreatic head cyst and small left-sided renal cysts. 5. Spondylosis of the lumbar spine. Electronically Signed   By: Ashley Royalty M.D.   On: 08/09/2016 00:04   Ct  Angio Chest Pe W Or Wo Contrast  Result Date: 08/08/2016 CLINICAL DATA:  83 year old with acute onset of chest pain, shortness of breath and nausea and  vomiting that began earlier today. EXAM: CT ANGIOGRAPHY CHEST WITH CONTRAST TECHNIQUE: Multidetector CT imaging of the chest was performed using the standard protocol during bolus administration of intravenous contrast. Multiplanar CT image reconstructions and MIPs were obtained to evaluate the vascular anatomy. CONTRAST:  100 mL Isovue 370 IV. COMPARISON:  Unenhanced CT chest 01/13/2015. FINDINGS: Beam hardening streak artifact is present as the patient was unable to raise the arms over the head. Respiratory motion blurred many of the images. Cardiovascular: Contrast opacification of the pulmonary arteries is very good. No filling defects within either main pulmonary artery or their segmental branches in either lung to suggest pulmonary embolism. Heart moderately enlarged with left ventricular predominance. Severe LAD and right coronary artery atherosclerosis. No pericardial effusion. Severe atherosclerosis involving the thoracic and upper abdominal aorta without evidence of aneurysm or dissection. Gas is present within the right subclavian vein and the lower right internal jugular vein, possibly related to an intravenous injection in the right upper extremity. Mediastinum/Nodes: Scattered normal sized mediastinal lymph nodes. No pathologically enlarged mediastinal, hilar or axillary lymph nodes. No mediastinal masses. Normal-appearing esophagus. Visualized thyroid gland unremarkable. Lungs/Pleura: Low lung volumes. Scattered areas of localized hyperlucency in both lungs indicating focal air trapping. Scarring in the lingula and in the lower lobes. No confluent airspace consolidation. No significant pulmonary parenchymal nodules or masses, as there is a stable subpleural nodule adjacent to the right major fissure (series 7, image 45), indicating a subpleural lymph  node. No pleural effusions. Subpleural fibrosis is present bilaterally, most prominent in the lingula. Central airways patent. Upper Abdomen: Small hiatal hernia. Prior cholecystectomy which accounts for the dilated proximal common bile duct. Scarring and a simple cyst is present in the upper pole of the visualized left kidney. Visualized upper abdomen otherwise unremarkable for the early phase of enhancement. Musculoskeletal: Lower cervical degenerative disc disease and spondylosis. Osseous demineralization. No acute abnormalities. Review of the MIP images confirms the above findings. IMPRESSION: 1. No evidence of pulmonary embolism. 2. Moderate cardiomegaly. LAD and right coronary artery atherosclerosis. 3. Severe atherosclerosis involving the thoracic and upper abdominal aorta without aneurysm. 4. Low lung volumes. Scattered areas of localized air trapping in both lungs as can be seen in patients with asthma or bronchitis. Chronic lung changes as detailed above No acute cardiopulmonary disease otherwise. 5. Small hiatal hernia. Electronically Signed   By: Evangeline Dakin M.D.   On: 08/08/2016 20:47   Dg Chest Portable 1 View  Result Date: 08/08/2016 CLINICAL DATA:  Dyspnea and EXAM: PORTABLE CHEST 1 VIEW COMPARISON:  03/14/2016 FINDINGS: The heart size and mediastinal contours are within normal limits. Aortic atherosclerosis is noted. No pneumonic consolidation, effusion or pneumothorax. Chronic subpleural interstitial prominence may reflect areas of fibrosis, scarring and/or chronic interstitial lung disease. Osteoarthritis of both glenohumeral joints with spurring and joint space narrowing. Slight remodeling along the undersurface of the distal acromion and clavicle on the left. This can be seen with chronic rotator cuff tear or rheumatoid arthritis. IMPRESSION: 1. Aortic atherosclerosis. 2. Mild stable interstitial prominence which may reflect subpleural fibrosis, scarring or sequela of chronic  interstitial disease. 3. No acute pneumonic consolidation or CHF. Electronically Signed   By: Ashley Royalty M.D.   On: 08/08/2016 19:52    Procedures Procedures (including critical care time)  Medications Ordered in ED Medications  piperacillin-tazobactam (ZOSYN) IVPB 3.375 g (not administered)  vancomycin (VANCOCIN) 500 mg in sodium chloride 0.9 % 100 mL IVPB (not administered)  nitroGLYCERIN (NITROSTAT) SL tablet 0.4 mg (0.4  mg Sublingual Given 08/08/16 2115)  0.9 %  sodium chloride infusion (not administered)  sodium chloride 0.9 % bolus 1,000 mL (0 mLs Intravenous Stopped 08/08/16 2104)    And  sodium chloride 0.9 % bolus 1,000 mL (0 mLs Intravenous Stopped 08/08/16 2207)  piperacillin-tazobactam (ZOSYN) IVPB 3.375 g (0 g Intravenous Stopped 08/08/16 2104)  vancomycin (VANCOCIN) IVPB 1000 mg/200 mL premix (0 mg Intravenous Stopped 08/08/16 2116)  iopamidol (ISOVUE-370) 76 % injection (100 mLs  Contrast Given 08/08/16 2013)  fentaNYL (SUBLIMAZE) injection 50 mcg (50 mcg Intravenous Given 08/08/16 2235)  ondansetron (ZOFRAN) injection 4 mg (4 mg Intravenous Given 08/08/16 2249)  aspirin chewable tablet 324 mg (324 mg Oral Given 08/09/16 0047)   CRITICAL CARE Performed by: Alvino Chapel   Total critical care time: 30 minutes  Critical care time was exclusive of separately billable procedures and treating other patients.  Critical care was necessary to treat or prevent imminent or life-threatening deterioration.  Critical care was time spent personally by me on the following activities: development of treatment plan with patient and/or surrogate as well as nursing, discussions with consultants, evaluation of patient's response to treatment, examination of patient, obtaining history from patient or surrogate, ordering and performing treatments and interventions, ordering and review of laboratory studies, ordering and review of radiographic studies, pulse oximetry and re-evaluation of  patient's condition.   Initial Impression / Assessment and Plan / ED Course  I have reviewed the triage vital signs and the nursing notes.  Pertinent labs & imaging results that were available during my care of the patient were reviewed by me and considered in my medical decision making (see chart for details).     83 year old female with history of chronic cholecystitis with laparoscopic cholecystectomy performed in October 2017, dilated common bile duct,  gram-negative bacteremia, diabetes, diastolic heart failure, depression/bipolar disorder presents with concern for chest pain/epigastric pain.    Patient on a febrile to 100.6 on arrival to the emergency department, tachycardic, with rhythm difficult to interpret due to artifact and tremor and lactic acid of 4.  She was given 30 mL/kg of normal saline, vancomycin and Zosyn for possible sepsis of unknown etiology. Code sepsis was not initially called given concern for possible other etiology of mildly elevated temperature, chest pain and dyspnea. CT PE study was ordered which showed no sign of pulmonary embolus or pneumonia.   Temperature rectally found to be 102. Labs returned showing significant elevation in her transaminases 2000 and 1000.  Added tylenol level, hepatitis panel, and CT abdomen/pelvis to evaluate for other distal obstruction/signs of cholangitis.    Tylenol negative, family reports little tylenol use (1-2 tablets at night). CT abdomen/pelvis not clearly showing cholangitis or any other etiologies of fever or abdominal pain.  Overall, doubt NMS/serotonin syndrome, not rigid, CK normal, reports feeling chills.  Added on lithium level.   Lactic acid with persistent elevation after 30cc/kg of normal saline. Patient with some desaturation after receiving fentanyl, improved with 2L NS.  She is DNR/DNI, and given normal blood pressures, improved heart rate, do not feel further fluid boluses indicated at this time and feel liver  dysfunction may also contribute to lactic acid elevation.    ECG initially difficult to interpret due to artifact.  Repeat ECG show possible afif/flutter with artifact, and other repeat ECG shows sinus rhythm with TW depressions in lateral leads.  Troponin initially negative, then increased to .41. Discussed with Cardiology, likely stress from acute illness/sepsis.    83yo female  with chest pain, abdominal pain, emesis, fever and will admit for concern for sepsis of unknown etiology (r/o cholangitis/bacteremia), transaminitis likely shock liver, and lactic acidosis.  Dr. Loleta Books accepting pt.     Final Clinical Impressions(s) / ED Diagnoses   Final diagnoses:  Chest pain  Transaminitis  Lactic acidosis  Sepsis, due to unspecified organism ([place])  Elevated troponin    New Prescriptions New Prescriptions   No medications on file     Gareth Morgan, MD 08/09/16 0107

## 2016-08-08 NOTE — Progress Notes (Signed)
Pharmacy Antibiotic Note  [name] is a 83 y.o. female admitted on 08/08/2016 with sepsis.  Pharmacy has been consulted for vancomycin and zosyn dosing. WBC normal and temperature 100.6. LA elevated at 4.08. SCr 1.24 for estimated CrCl ~ 25-30 mL/min.   Plan: Vancomycin 1g IV x1, then 500 mg IV q24hr  Zosyn 3.375 g IV q8hr  Vancomycin trough at [facility] and as needed Monitor renal function, clinical picture, culture data F/u length of therapy    Temp (24hrs), Avg:100.6 F (38.1 C), Min:100.6 F (38.1 C), Max:100.6 F (38.1 C)   Recent Labs Lab 08/08/16 1845 08/08/16 1858  WBC 9.5  --   CREATININE 1.24*  --   LATICACIDVEN  --  4.08*    CrCl cannot be calculated (Unknown ideal weight.).    Allergies  Allergen Reactions  . Codeine Other (See Comments)    slurred speech    Antimicrobials this admission: 6/6 Vanc >>  6/6 Zosyn >>   Dose adjustments this admission: n/a   Microbiology results: pending   Argie Ramming, PharmD Pharmacy Resident  Pager [phone] 08/08/16 7:56 PM

## 2016-08-08 NOTE — ED Notes (Signed)
Portable at bedside 

## 2016-08-08 NOTE — ED Notes (Signed)
Informed first nurse of lactic acid 4.08

## 2016-08-08 NOTE — ED Notes (Signed)
Daughter, [name], [phone]

## 2016-08-08 NOTE — ED Triage Notes (Signed)
Pt states she started having CP yesterday, it went away and then came back again at 3pm today, pt 9/10 CP, with SOB, nausea. Pt is shaking so accurate vital signs are difficult. Mild fever 100.6.

## 2016-08-09 ENCOUNTER — Encounter (HOSPITAL_COMMUNITY)
Admission: EM | Disposition: A | Discharge status: Home Health | Payer: Self-pay | Source: Home / Self Care | Attending: Internal Medicine

## 2016-08-09 ENCOUNTER — Inpatient Hospital Stay (HOSPITAL_COMMUNITY): Payer: PPO | Admitting: Certified Registered"

## 2016-08-09 ENCOUNTER — Encounter (HOSPITAL_COMMUNITY): Payer: Self-pay | Admitting: Family Medicine

## 2016-08-09 ENCOUNTER — Inpatient Hospital Stay (HOSPITAL_COMMUNITY): Payer: PPO

## 2016-08-09 DIAGNOSIS — I130 Hypertensive heart and chronic kidney disease with heart failure and stage 1 through stage 4 chronic kidney disease, or unspecified chronic kidney disease: Secondary | ICD-10-CM | POA: Diagnosis not present

## 2016-08-09 DIAGNOSIS — T85520D Displacement of bile duct prosthesis, subsequent encounter: Secondary | ICD-10-CM | POA: Diagnosis not present

## 2016-08-09 DIAGNOSIS — R1010 Upper abdominal pain, unspecified: Secondary | ICD-10-CM | POA: Diagnosis not present

## 2016-08-09 DIAGNOSIS — A419 Sepsis, unspecified organism: Secondary | ICD-10-CM

## 2016-08-09 DIAGNOSIS — M797 Fibromyalgia: Secondary | ICD-10-CM | POA: Diagnosis not present

## 2016-08-09 DIAGNOSIS — K5140 Inflammatory polyps of colon without complications: Secondary | ICD-10-CM | POA: Diagnosis not present

## 2016-08-09 DIAGNOSIS — I5032 Chronic diastolic (congestive) heart failure: Secondary | ICD-10-CM | POA: Diagnosis not present

## 2016-08-09 DIAGNOSIS — I10 Essential (primary) hypertension: Secondary | ICD-10-CM

## 2016-08-09 DIAGNOSIS — K635 Polyp of colon: Secondary | ICD-10-CM | POA: Diagnosis not present

## 2016-08-09 DIAGNOSIS — K219 Gastro-esophageal reflux disease without esophagitis: Secondary | ICD-10-CM | POA: Diagnosis not present

## 2016-08-09 DIAGNOSIS — E1122 Type 2 diabetes mellitus with diabetic chronic kidney disease: Secondary | ICD-10-CM | POA: Diagnosis not present

## 2016-08-09 DIAGNOSIS — A414 Sepsis due to anaerobes: Secondary | ICD-10-CM | POA: Diagnosis not present

## 2016-08-09 DIAGNOSIS — F319 Bipolar disorder, unspecified: Secondary | ICD-10-CM

## 2016-08-09 DIAGNOSIS — K839 Disease of biliary tract, unspecified: Secondary | ICD-10-CM | POA: Diagnosis not present

## 2016-08-09 DIAGNOSIS — H3530 Unspecified macular degeneration: Secondary | ICD-10-CM | POA: Diagnosis not present

## 2016-08-09 DIAGNOSIS — E872 Acidosis: Secondary | ICD-10-CM | POA: Diagnosis not present

## 2016-08-09 DIAGNOSIS — E1165 Type 2 diabetes mellitus with hyperglycemia: Secondary | ICD-10-CM | POA: Diagnosis not present

## 2016-08-09 DIAGNOSIS — I272 Pulmonary hypertension, unspecified: Secondary | ICD-10-CM | POA: Diagnosis not present

## 2016-08-09 DIAGNOSIS — D62 Acute posthemorrhagic anemia: Secondary | ICD-10-CM | POA: Diagnosis not present

## 2016-08-09 DIAGNOSIS — Z66 Do not resuscitate: Secondary | ICD-10-CM | POA: Diagnosis not present

## 2016-08-09 DIAGNOSIS — K625 Hemorrhage of anus and rectum: Secondary | ICD-10-CM | POA: Diagnosis not present

## 2016-08-09 DIAGNOSIS — R1013 Epigastric pain: Secondary | ICD-10-CM | POA: Diagnosis not present

## 2016-08-09 DIAGNOSIS — I248 Other forms of acute ischemic heart disease: Secondary | ICD-10-CM | POA: Diagnosis not present

## 2016-08-09 DIAGNOSIS — R945 Abnormal results of liver function studies: Secondary | ICD-10-CM | POA: Diagnosis not present

## 2016-08-09 DIAGNOSIS — R748 Abnormal levels of other serum enzymes: Secondary | ICD-10-CM

## 2016-08-09 DIAGNOSIS — E118 Type 2 diabetes mellitus with unspecified complications: Secondary | ICD-10-CM

## 2016-08-09 DIAGNOSIS — T85520A Displacement of bile duct prosthesis, initial encounter: Secondary | ICD-10-CM | POA: Diagnosis not present

## 2016-08-09 DIAGNOSIS — K830 Cholangitis: Secondary | ICD-10-CM

## 2016-08-09 DIAGNOSIS — N183 Chronic kidney disease, stage 3 (moderate): Secondary | ICD-10-CM | POA: Diagnosis not present

## 2016-08-09 DIAGNOSIS — K922 Gastrointestinal hemorrhage, unspecified: Secondary | ICD-10-CM | POA: Diagnosis not present

## 2016-08-09 DIAGNOSIS — K921 Melena: Secondary | ICD-10-CM | POA: Diagnosis not present

## 2016-08-09 DIAGNOSIS — M545 Low back pain: Secondary | ICD-10-CM | POA: Diagnosis not present

## 2016-08-09 DIAGNOSIS — I361 Nonrheumatic tricuspid (valve) insufficiency: Secondary | ICD-10-CM | POA: Diagnosis not present

## 2016-08-09 DIAGNOSIS — Y838 Other surgical procedures as the cause of abnormal reaction of the patient, or of later complication, without mention of misadventure at the time of the procedure: Secondary | ICD-10-CM | POA: Diagnosis not present

## 2016-08-09 DIAGNOSIS — F310 Bipolar disorder, current episode hypomanic: Secondary | ICD-10-CM | POA: Diagnosis not present

## 2016-08-09 DIAGNOSIS — F3113 Bipolar disorder, current episode manic without psychotic features, severe: Secondary | ICD-10-CM | POA: Diagnosis not present

## 2016-08-09 DIAGNOSIS — N3010 Interstitial cystitis (chronic) without hematuria: Secondary | ICD-10-CM | POA: Diagnosis not present

## 2016-08-09 DIAGNOSIS — A4159 Other Gram-negative sepsis: Secondary | ICD-10-CM | POA: Diagnosis not present

## 2016-08-09 DIAGNOSIS — N179 Acute kidney failure, unspecified: Secondary | ICD-10-CM | POA: Diagnosis not present

## 2016-08-09 DIAGNOSIS — K838 Other specified diseases of biliary tract: Secondary | ICD-10-CM | POA: Diagnosis not present

## 2016-08-09 DIAGNOSIS — D120 Benign neoplasm of cecum: Secondary | ICD-10-CM | POA: Diagnosis not present

## 2016-08-09 DIAGNOSIS — R778 Other specified abnormalities of plasma proteins: Secondary | ICD-10-CM | POA: Diagnosis present

## 2016-08-09 DIAGNOSIS — G8929 Other chronic pain: Secondary | ICD-10-CM | POA: Diagnosis not present

## 2016-08-09 DIAGNOSIS — D122 Benign neoplasm of ascending colon: Secondary | ICD-10-CM | POA: Diagnosis not present

## 2016-08-09 DIAGNOSIS — R17 Unspecified jaundice: Secondary | ICD-10-CM | POA: Diagnosis not present

## 2016-08-09 DIAGNOSIS — E46 Unspecified protein-calorie malnutrition: Secondary | ICD-10-CM | POA: Diagnosis not present

## 2016-08-09 DIAGNOSIS — R740 Nonspecific elevation of levels of transaminase and lactic acid dehydrogenase [LDH]: Secondary | ICD-10-CM | POA: Diagnosis not present

## 2016-08-09 DIAGNOSIS — E1121 Type 2 diabetes mellitus with diabetic nephropathy: Secondary | ICD-10-CM | POA: Diagnosis not present

## 2016-08-09 DIAGNOSIS — R109 Unspecified abdominal pain: Secondary | ICD-10-CM | POA: Diagnosis not present

## 2016-08-09 DIAGNOSIS — D649 Anemia, unspecified: Secondary | ICD-10-CM | POA: Diagnosis not present

## 2016-08-09 DIAGNOSIS — E785 Hyperlipidemia, unspecified: Secondary | ICD-10-CM | POA: Diagnosis not present

## 2016-08-09 DIAGNOSIS — R7989 Other specified abnormal findings of blood chemistry: Secondary | ICD-10-CM

## 2016-08-09 DIAGNOSIS — Z9049 Acquired absence of other specified parts of digestive tract: Secondary | ICD-10-CM | POA: Diagnosis not present

## 2016-08-09 DIAGNOSIS — K811 Chronic cholecystitis: Secondary | ICD-10-CM | POA: Diagnosis not present

## 2016-08-09 DIAGNOSIS — K8309 Other cholangitis: Secondary | ICD-10-CM | POA: Diagnosis present

## 2016-08-09 DIAGNOSIS — R1011 Right upper quadrant pain: Secondary | ICD-10-CM | POA: Diagnosis not present

## 2016-08-09 DIAGNOSIS — K8030 Calculus of bile duct with cholangitis, unspecified, without obstruction: Secondary | ICD-10-CM | POA: Diagnosis not present

## 2016-08-09 DIAGNOSIS — Z4682 Encounter for fitting and adjustment of non-vascular catheter: Secondary | ICD-10-CM | POA: Diagnosis not present

## 2016-08-09 DIAGNOSIS — R7881 Bacteremia: Secondary | ICD-10-CM | POA: Diagnosis not present

## 2016-08-09 HISTORY — PX: ERCP: SHX5425

## 2016-08-09 LAB — GLUCOSE, CAPILLARY
GLUCOSE-CAPILLARY: 236 mg/dL — AB (ref 65–99)
GLUCOSE-CAPILLARY: 134 mg/dL — AB (ref 65–99)
GLUCOSE-CAPILLARY: 55 mg/dL — AB (ref 65–99)
GLUCOSE-CAPILLARY: 97 mg/dL (ref 65–99)
Glucose-Capillary: 181 mg/dL — ABNORMAL HIGH (ref 65–99)
Glucose-Capillary: 208 mg/dL — ABNORMAL HIGH (ref 65–99)
Glucose-Capillary: 238 mg/dL — ABNORMAL HIGH (ref 65–99)
Glucose-Capillary: 54 mg/dL — ABNORMAL LOW (ref 65–99)

## 2016-08-09 LAB — COMPREHENSIVE METABOLIC PANEL
ALK PHOS: 331 U/L — AB (ref 38–126)
ALT: 1270 U/L — AB (ref 14–54)
AST: 1391 U/L — AB (ref 15–41)
Albumin: 2.9 g/dL — ABNORMAL LOW (ref 3.5–5.0)
Anion gap: 13 (ref 5–15)
BILIRUBIN TOTAL: 3.7 mg/dL — AB (ref 0.3–1.2)
BUN: 29 mg/dL — AB (ref 6–20)
CALCIUM: 8.8 mg/dL — AB (ref 8.9–10.3)
CHLORIDE: 102 mmol/L (ref 101–111)
CO2: 24 mmol/L (ref 22–32)
CREATININE: 1.44 mg/dL — AB (ref 0.44–1.00)
GFR calc Af Amer: 37 mL/min — ABNORMAL LOW (ref >60)
GFR, EST NON AFRICAN AMERICAN: 32 mL/min — AB (ref >60)
Glucose, Bld: 248 mg/dL — ABNORMAL HIGH (ref 65–99)
Potassium: 3.4 mmol/L — ABNORMAL LOW (ref 3.5–5.1)
Sodium: 139 mmol/L (ref 135–145)
TOTAL PROTEIN: 5.5 g/dL — AB (ref 6.5–8.1)

## 2016-08-09 LAB — BLOOD CULTURE ID PANEL (REFLEXED)
Acinetobacter baumannii: NOT DETECTED
CANDIDA GLABRATA: NOT DETECTED
CANDIDA KRUSEI: NOT DETECTED
CANDIDA TROPICALIS: NOT DETECTED
Candida albicans: NOT DETECTED
Candida parapsilosis: NOT DETECTED
Carbapenem resistance: NOT DETECTED
ESCHERICHIA COLI: NOT DETECTED
Enterobacter cloacae complex: NOT DETECTED
Enterobacteriaceae species: DETECTED — AB
Enterococcus species: NOT DETECTED
Haemophilus influenzae: NOT DETECTED
Klebsiella oxytoca: NOT DETECTED
Klebsiella pneumoniae: DETECTED — AB
Listeria monocytogenes: NOT DETECTED
NEISSERIA MENINGITIDIS: NOT DETECTED
PROTEUS SPECIES: NOT DETECTED
Pseudomonas aeruginosa: NOT DETECTED
SERRATIA MARCESCENS: NOT DETECTED
STAPHYLOCOCCUS SPECIES: NOT DETECTED
STREPTOCOCCUS AGALACTIAE: NOT DETECTED
Staphylococcus aureus (BCID): NOT DETECTED
Streptococcus pneumoniae: NOT DETECTED
Streptococcus pyogenes: NOT DETECTED
Streptococcus species: NOT DETECTED

## 2016-08-09 LAB — CBC
HCT: 32.5 % — ABNORMAL LOW (ref 36.0–46.0)
Hemoglobin: 10.6 g/dL — ABNORMAL LOW (ref 12.0–15.0)
MCH: 29.1 pg (ref 26.0–34.0)
MCHC: 32.6 g/dL (ref 30.0–36.0)
MCV: 89.3 fL (ref 78.0–100.0)
PLATELETS: 166 10*3/uL (ref 150–400)
RBC: 3.64 MIL/uL — ABNORMAL LOW (ref 3.87–5.11)
RDW: 14.4 % (ref 11.5–15.5)
WBC: 15.3 10*3/uL — AB (ref 4.0–10.5)

## 2016-08-09 LAB — MRSA PCR SCREENING: MRSA by PCR: POSITIVE — AB

## 2016-08-09 LAB — LACTIC ACID, PLASMA
Lactic Acid, Venous: 1.6 mmol/L (ref 0.5–1.9)
Lactic Acid, Venous: 2.8 mmol/L (ref 0.5–1.9)

## 2016-08-09 LAB — CK: Total CK: 21 U/L — ABNORMAL LOW (ref 38–234)

## 2016-08-09 LAB — TROPONIN I
Troponin I: 0.41 ng/mL (ref <0.03)
Troponin I: 1.03 ng/mL (ref <0.03)
Troponin I: 0.49 ng/mL (ref <0.03)

## 2016-08-09 LAB — LITHIUM LEVEL: Lithium Lvl: 0.3 mmol/L — ABNORMAL LOW (ref 0.60–1.20)

## 2016-08-09 SURGERY — ERCP, WITH INTERVENTION IF INDICATED
Anesthesia: General

## 2016-08-09 MED ORDER — LACTATED RINGERS IV SOLN
INTRAVENOUS | Status: DC | PRN
  Administered 2016-08-09: 13:00:00 via INTRAVENOUS

## 2016-08-09 MED ORDER — ONDANSETRON HCL 4 MG PO TABS: 4.0000 mg | ORAL_TABLET | Freq: Four times a day (QID) | ORAL | Status: DC | PRN

## 2016-08-09 MED ORDER — MUPIROCIN 2 % EX OINT
1.0000 "application " | TOPICAL_OINTMENT | Freq: Two times a day (BID) | CUTANEOUS | Status: AC
  Administered 2016-08-09 – 2016-08-14 (×8): 1 via NASAL
  Filled 2016-08-09 (×3): qty 22

## 2016-08-09 MED ORDER — POTASSIUM CHLORIDE 2 MEQ/ML IV SOLN
INTRAVENOUS | Status: DC
Start: 2016-08-09 — End: 2016-08-10
  Administered 2016-08-09: 21:00:00 via INTRAVENOUS
  Filled 2016-08-09 (×2): qty 1000

## 2016-08-09 MED ORDER — FLUOXETINE HCL 20 MG PO CAPS
20.0000 mg | ORAL_CAPSULE | Freq: Every day | ORAL | Status: DC
  Administered 2016-08-09 – 2016-08-18 (×9): 20 mg via ORAL
  Filled 2016-08-09 (×10): qty 1

## 2016-08-09 MED ORDER — SUGAMMADEX SODIUM 200 MG/2ML IV SOLN
INTRAVENOUS | Status: DC | PRN
  Administered 2016-08-09: 200 mg via INTRAVENOUS

## 2016-08-09 MED ORDER — INDOMETHACIN 50 MG RE SUPP
RECTAL | Status: AC
  Filled 2016-08-09: qty 2

## 2016-08-09 MED ORDER — LITHIUM CARBONATE 150 MG PO CAPS
150.0000 mg | ORAL_CAPSULE | Freq: Every day | ORAL | Status: DC
  Administered 2016-08-09 – 2016-08-18 (×7): 150 mg via ORAL
  Filled 2016-08-09 (×10): qty 1

## 2016-08-09 MED ORDER — ASPIRIN 81 MG PO CHEW
324.0000 mg | CHEWABLE_TABLET | Freq: Once | ORAL | Status: AC
  Administered 2016-08-09: 324 mg via ORAL
  Filled 2016-08-09: qty 4

## 2016-08-09 MED ORDER — FENTANYL CITRATE (PF) 100 MCG/2ML IJ SOLN
INTRAMUSCULAR | Status: DC | PRN
  Administered 2016-08-09: 50 ug via INTRAVENOUS
  Administered 2016-08-09: 25 ug via INTRAVENOUS

## 2016-08-09 MED ORDER — SODIUM CHLORIDE 0.9 % IV SOLN: INTRAVENOUS | Status: DC

## 2016-08-09 MED ORDER — SODIUM CHLORIDE 0.9 % IV SOLN
INTRAVENOUS | Status: DC | PRN
  Administered 2016-08-09: 20 mL

## 2016-08-09 MED ORDER — ENOXAPARIN SODIUM 30 MG/0.3ML ~~LOC~~ SOLN
30.0000 mg | SUBCUTANEOUS | Status: DC
  Administered 2016-08-10: 30 mg via SUBCUTANEOUS
  Filled 2016-08-09: qty 0.3

## 2016-08-09 MED ORDER — HYDROMORPHONE HCL 1 MG/ML IJ SOLN
1.0000 mg | INTRAMUSCULAR | Status: DC | PRN
  Administered 2016-08-10: 1 mg via INTRAVENOUS
  Filled 2016-08-09: qty 1

## 2016-08-09 MED ORDER — IOPAMIDOL (ISOVUE-300) INJECTION 61%
INTRAVENOUS | Status: AC
  Filled 2016-08-09: qty 50

## 2016-08-09 MED ORDER — ONDANSETRON HCL 4 MG/2ML IJ SOLN
4.0000 mg | Freq: Four times a day (QID) | INTRAMUSCULAR | Status: DC | PRN
  Administered 2016-08-09 – 2016-08-15 (×5): 4 mg via INTRAVENOUS
  Filled 2016-08-09 (×4): qty 2

## 2016-08-09 MED ORDER — LIDOCAINE 2% (20 MG/ML) 5 ML SYRINGE
INTRAMUSCULAR | Status: DC | PRN
  Administered 2016-08-09: 60 mg via INTRAVENOUS

## 2016-08-09 MED ORDER — OXYBUTYNIN CHLORIDE 5 MG PO TABS
5.0000 mg | ORAL_TABLET | Freq: Two times a day (BID) | ORAL | Status: DC
  Administered 2016-08-09 – 2016-08-18 (×17): 5 mg via ORAL
  Filled 2016-08-09 (×17): qty 1

## 2016-08-09 MED ORDER — ATORVASTATIN CALCIUM 10 MG PO TABS
5.0000 mg | ORAL_TABLET | Freq: Every day | ORAL | Status: DC
  Administered 2016-08-09 – 2016-08-18 (×9): 5 mg via ORAL
  Filled 2016-08-09 (×9): qty 1

## 2016-08-09 MED ORDER — LACTATED RINGERS IV SOLN: INTRAVENOUS | Status: DC | PRN

## 2016-08-09 MED ORDER — PHENYLEPHRINE 40 MCG/ML (10ML) SYRINGE FOR IV PUSH (FOR BLOOD PRESSURE SUPPORT)
PREFILLED_SYRINGE | INTRAVENOUS | Status: DC | PRN
  Administered 2016-08-09: 80 ug via INTRAVENOUS

## 2016-08-09 MED ORDER — ASPIRIN EC 81 MG PO TBEC
81.0000 mg | DELAYED_RELEASE_TABLET | Freq: Every day | ORAL | Status: DC
  Administered 2016-08-09 – 2016-08-11 (×3): 81 mg via ORAL
  Filled 2016-08-09 (×3): qty 1

## 2016-08-09 MED ORDER — PROPOFOL 10 MG/ML IV BOLUS
INTRAVENOUS | Status: DC | PRN
  Administered 2016-08-09: 140 mg via INTRAVENOUS

## 2016-08-09 MED ORDER — MIRTAZAPINE 30 MG PO TABS
30.0000 mg | ORAL_TABLET | Freq: Every day | ORAL | Status: DC
  Administered 2016-08-09 – 2016-08-17 (×9): 30 mg via ORAL
  Filled 2016-08-09 (×5): qty 1
  Filled 2016-08-09: qty 2
  Filled 2016-08-09 (×3): qty 1

## 2016-08-09 MED ORDER — VILAZODONE HCL 20 MG PO TABS
20.0000 mg | ORAL_TABLET | Freq: Every day | ORAL | Status: DC
  Administered 2016-08-09 – 2016-08-18 (×8): 20 mg via ORAL
  Filled 2016-08-09 (×11): qty 1

## 2016-08-09 MED ORDER — GLYCOPYRROLATE 0.2 MG/ML IV SOSY
PREFILLED_SYRINGE | INTRAVENOUS | Status: DC | PRN
  Administered 2016-08-09: .1 mg via INTRAVENOUS

## 2016-08-09 MED ORDER — METOPROLOL TARTRATE 12.5 MG HALF TABLET
12.5000 mg | ORAL_TABLET | Freq: Two times a day (BID) | ORAL | Status: DC
  Administered 2016-08-09 – 2016-08-16 (×11): 12.5 mg via ORAL
  Filled 2016-08-09 (×13): qty 1

## 2016-08-09 MED ORDER — CHLORHEXIDINE GLUCONATE CLOTH 2 % EX PADS
6.0000 | MEDICATED_PAD | Freq: Every day | CUTANEOUS | Status: AC
  Administered 2016-08-09 – 2016-08-13 (×4): 6 via TOPICAL

## 2016-08-09 MED ORDER — ZOLPIDEM TARTRATE 5 MG PO TABS
2.5000 mg | ORAL_TABLET | Freq: Every day | ORAL | Status: DC
  Administered 2016-08-09 – 2016-08-17 (×9): 2.5 mg via ORAL
  Filled 2016-08-09 (×9): qty 1

## 2016-08-09 MED ORDER — INSULIN ASPART 100 UNIT/ML ~~LOC~~ SOLN
0.0000 [IU] | SUBCUTANEOUS | Status: DC
  Administered 2016-08-09: 3 [IU] via SUBCUTANEOUS
  Administered 2016-08-12: 2 [IU] via SUBCUTANEOUS
  Administered 2016-08-12 – 2016-08-14 (×4): 1 [IU] via SUBCUTANEOUS
  Administered 2016-08-14: 2 [IU] via SUBCUTANEOUS
  Administered 2016-08-14: 1 [IU] via SUBCUTANEOUS
  Administered 2016-08-15 – 2016-08-17 (×4): 2 [IU] via SUBCUTANEOUS
  Administered 2016-08-17: 1 [IU] via SUBCUTANEOUS
  Administered 2016-08-17 – 2016-08-18 (×3): 2 [IU] via SUBCUTANEOUS

## 2016-08-09 MED ORDER — SODIUM CHLORIDE 0.9 % IV SOLN
INTRAVENOUS | Status: DC
  Administered 2016-08-09 (×2): via INTRAVENOUS

## 2016-08-09 MED ORDER — FENTANYL CITRATE (PF) 100 MCG/2ML IJ SOLN: 25.0000 ug | INTRAMUSCULAR | Status: DC | PRN

## 2016-08-09 MED ORDER — ROCURONIUM BROMIDE 100 MG/10ML IV SOLN
INTRAVENOUS | Status: DC | PRN
  Administered 2016-08-09: 40 mg via INTRAVENOUS

## 2016-08-09 NOTE — H&P (Signed)
History and Physical  Patient Name: [name]     WUJ:[id number]    DOB: Aug 03, 1929    DOA: 08/08/2016 PCP: Lavone Orn, MD   Patient coming from: Home  Chief Complaint: Epigastric pain, fever  HPI: [name] is a 83 y.o. female with a past medical history significant for Bipolar disorder, HTN, NIDDM, CKD baseline Cr 1.1, HFpEF and chronic pain (interstitial cystitis and chronic back pain) who presents with fever and epigastric pain.  Caveat that all history is collected from EDP and daughter by phone, as patient is unable to provide her own history.  The patient was in her usual state of health until this week when she had a brief episode of epigastric pain earlier in the week that went away by itself after a few hours.  Then this afternoon it returned, severe, with shakes and nausea and fever, so her family brought her to the ER.   ED course: -Febrile to 102.89F, heart rate 92, respirations 22, blood pressure 191/78, pulse ox treatment -Na 135, K 3.9, Cr 1.24 (baseline 1.1), WBC 9.5K, Hgb 13 -AST/ALT 2610/1484  -Total bilirubin 2.6, alkaline phosphatase >400 -Lipase normal -Lactic acid 4 --> 4.8 -BNP normal, troponin normal --> 0.29 --> 0.4 -UA without WBC, bacteriuria -Acetaminophen level negative -CK normal -INR 1.1 -CT angiogram of the chest showed no PE, dilated CBD to 2 cm (similar to previous images), status post cholecystectomy -CT abdomen without contrast was mostly unremarkable -Chest x-ray showed no pneumonia -Blood cultures were obtained, she was given 30 cc/kg normal saline and chewable aspirin 324 as well as vancomycin and Zosyn and TRH were asked to evaluate for sepsis     Dec 2016: patient had cholecystitis/pancreatitis treated with antibiotics to resolution,  Dec 2016: Had an EUS by Dr. Paulita Fujita afterwards for a pancreatic head cyst, no mention of malignancy.   Oct 2017: Admitted for similar picture, sepsis from cholecystitis, had a cholecystectomy with intra-op  cholangiogram showed "marked dilatation of the CBD without stone" and nonspecific poor passage of contrast into the duodenum from some form of obstruction, but no stone specifically seen. ERCP was recommended by GI, but doesn't appear to have happened.        ROS: Review of Systems  Unable to perform ROS: Acuity of condition          Past Medical History:  Diagnosis Date  . Chest pain    a. 07/2014 Neg MV, nl EF.  Marland Kitchen Chronic interstitial cystitis   . Chronic low back pain   . CKD (chronic kidney disease), stage III   . Diabetes mellitus without complication ([place])   . GERD (gastroesophageal reflux disease)   . Hyperlipidemia   . Hypertension   . Macular degeneration   . Microhematuria   . Osteopenia   . Pernicious anemia   . Postlaminectomy syndrome   . PVC (premature ventricular contraction)   . Right carpal tunnel syndrome     Past Surgical History:  Procedure Laterality Date  . BACK SURGERY    . CHOLECYSTECTOMY N/A 12/21/2015   Procedure: LAPAROSCOPIC CHOLECYSTECTOMY WITH INTRAOPERATIVE CHOLANGIOGRAM;  Surgeon: Alphonsa Overall, MD;  Location: [place];  Service: General;  Laterality: N/A;  . EUS N/A 02/23/2015   Procedure: ESOPHAGEAL ENDOSCOPIC ULTRASOUND (EUS) RADIAL;  Surgeon: Arta Silence, MD;  Location: [facility];  Service: Endoscopy;  Laterality: N/A;  . JOINT REPLACEMENT Left    hip and knee    Social History: Patient lives with her husband.  The patient  walks with a walker or without.  Non smoker.  Allergies  Allergen Reactions  . Codeine Other (See Comments)    slurred speech    Family history: family history includes Arthritis in her mother; Heart attack in her father.  Prior to Admission medications   Medication Sig Start Date End Date Taking? Authorizing Provider  acetaminophen (TYLENOL) 500 MG tablet Take 500 mg by mouth every 6 (six) hours as needed for mild pain.    [provider]  amLODipine (NORVASC) 5 MG tablet Take 1 tablet (5 mg  total) by mouth daily. 11/21/14   Rai, Vernelle Emerald, MD  aspirin EC 81 MG tablet Take 81 mg by mouth daily.    [provider]  cholecalciferol (VITAMIN D) 1000 UNITS tablet Take 2,000 Units by mouth daily.     [provider]  conjugated estrogens (PREMARIN) vaginal cream Place 1 Applicatorful vaginally daily as needed (burning).     [provider]  cyanocobalamin (,VITAMIN B-12,) 1000 MCG/ML injection Inject 1,000 mcg into the muscle every 30 (thirty) days.    [provider]  feeding supplement, ENSURE ENLIVE, (ENSURE ENLIVE) LIQD Take 237 mLs by mouth 2 (two) times daily between meals. 03/15/16   Dhungel, Flonnie Overman, MD  furosemide (LASIX) 20 MG tablet Take 2 tablets (40 mg total) by mouth daily. Patient taking differently: Take 20 mg by mouth daily.  02/08/15   Domenic Polite, MD  glimepiride (AMARYL) 1 MG tablet Take 2 tablets (2 mg total) by mouth every morning. 01/17/15   Charlynne Cousins, MD  guaiFENesin (ROBITUSSIN) 100 MG/5ML SOLN Take 5 mLs (100 mg total) by mouth every 4 (four) hours as needed for cough or to loosen phlegm. 03/15/16   Dhungel, Nishant, MD  lisinopril (PRINIVIL,ZESTRIL) 20 MG tablet Take 1 tablet (20 mg total) by mouth every evening. 07/22/14   Cristal Ford, DO  lithium carbonate 150 MG capsule Take 150 mg by mouth daily.    [provider]  metoprolol (LOPRESSOR) 50 MG tablet Take 1 tablet (50 mg total) by mouth 2 (two) times daily. 07/22/14   Mikhail, Velta Addison, DO  mirtazapine (REMERON) 30 MG tablet Take 30 mg by mouth at bedtime.     [provider]  nitroGLYCERIN (NITROSTAT) 0.4 MG SL tablet Place 1 tablet (0.4 mg total) under the tongue every 5 (five) minutes as needed for chest pain. 07/22/14   Mikhail, Velta Addison, DO  omeprazole (PRILOSEC) 40 MG capsule Take 40 mg by mouth every evening.     [provider]  pregabalin (LYRICA) 75 MG capsule Take 75 mg by mouth 2 (two) times daily.    [provider]   traMADol (ULTRAM) 50 MG tablet Take 1 tablet (50 mg total) by mouth 2 (two) times daily as needed (pain). Patient taking differently: Take 50 mg by mouth 2 (two) times daily.  07/22/14   Mikhail, Velta Addison, DO  Vilazodone HCl 20 MG TABS Take 20 mg by mouth daily. Viibryd    [provider]  zolpidem (AMBIEN) 5 MG tablet Take 2.5 mg by mouth at bedtime.     [provider]       Physical Exam: BP (!) 130/45   Pulse 95   Temp (!) 102.3 F (39.1 C) (Rectal)   Resp (!) 34   Wt 59.9 kg (132 lb)   SpO2 98%   BMI 22.66 kg/m  General appearance: Elderly adult female, awake but disoriented, weak, in moderate distress from fever, sepsis.   Eyes:  Anicteric on my exam, conjunctiva pink, lids and lashes normal. PERRL.    ENT: No nasal deformity, discharge, epistaxis.  Hearing unable to assess. OP dry without lesions.   Neck: No neck masses.  Trachea midline.  No thyromegaly/tenderness. Lymph: No cervical or supraclavicular lymphadenopathy. Skin: Hot diaphoretic.  No jaundice.  No suspicious rashes or lesions. Cardiac: Tachcyardic, regular, nl S1-S2, no murmurs appreciated.  Capillary refill is brisk.  JVP not visible.  No LE edema.  Radial and DP pulses 2+ and symmetric. Respiratory: Normal respiratory rate and rhythm.  CTAB without rales or wheezes. Abdomen: Abdomen soft.  Mild to moderate epigastric TTP without rebound. No ascites, distension, hepatosplenomegaly.   MSK: No deformities or effusions.  No cyanosis or clubbing. Neuro: Not oriented to place, year.  States she lives with husband.  Globally very weak but strength symmetric.  Cranial nerves grossly normal. Speech is fluent.    Psych: Confused, disoriented.  Awake and follows simple commands.       Labs on Admission:  I have personally reviewed following labs and imaging studies: CBC:  Recent Labs Lab 08/08/16 1845  WBC 9.5  NEUTROABS 7.9*  HGB 13.0  HCT 40.3  MCV 90.6  PLT 683   Basic Metabolic  Panel:  Recent Labs Lab 08/08/16 1845  NA 135  K 3.9  CL 96*  CO2 24  GLUCOSE 271*  BUN 32*  CREATININE 1.24*  CALCIUM 9.6   GFR: Estimated Creatinine Clearance: 28.6 mL/min (A) (by C-G formula based on SCr of 1.24 mg/dL (H)).  Liver Function Tests:  Recent Labs Lab 08/08/16 1845  AST 2,610*  ALT 1,484*  ALKPHOS 423*  BILITOT 2.6*  PROT 7.4  ALBUMIN 3.8    Recent Labs Lab 08/08/16 1845  LIPASE 42   No results for input(s): AMMONIA in the last 168 hours. Coagulation Profile:  Recent Labs Lab 08/08/16 2323  INR 1.13   Cardiac Enzymes:  Recent Labs Lab 08/08/16 2323  CKTOTAL 21*  TROPONINI 0.41*   BNP (last 3 results) No results for input(s): PROBNP in the last 8760 hours. HbA1C: No results for input(s): HGBA1C in the last 72 hours. CBG: No results for input(s): GLUCAP in the last 168 hours. Lipid Profile: No results for input(s): CHOL, HDL, LDLCALC, TRIG, CHOLHDL, LDLDIRECT in the last 72 hours. Thyroid Function Tests: No results for input(s): TSH, T4TOTAL, FREET4, T3FREE, THYROIDAB in the last 72 hours. Anemia Panel: No results for input(s): VITAMINB12, FOLATE, FERRITIN, TIBC, IRON, RETICCTPCT in the last 72 hours. Sepsis Labs: Lactic acid 4.8 Invalid input(s): PROCALCITONIN, LACTICIDVEN No results found for this or any previous visit (from the past 240 hour(s)).       Radiological Exams on Admission: Personally reviewed CXR shows no airspace disease or effusion; CT chest and CT abdomen report reviewed: Ct Abdomen Pelvis Wo Contrast  Result Date: 08/09/2016 CLINICAL DATA:  Dyspnea and nausea starting today. Elevated lactic acid levels. EXAM: CT ABDOMEN AND PELVIS WITHOUT CONTRAST TECHNIQUE: Multidetector CT imaging of the abdomen and pelvis was performed following the standard protocol without IV contrast. COMPARISON:  01/03/2016 CT and chest CT performed earlier on the same day FINDINGS: Lower chest: Borderline cardiomegaly with bibasilar  atelectasis and/or scarring. No pericardial effusion. There is coronary arteriosclerosis. Hepatobiliary: Status post cholecystectomy with chronic stable dilatation of the intra and extrahepatic biliary ducts without calculus noted. The common bile duct is slightly more dilated on current exam up to 2.1 cm at the pancreatic head versus 1.7 cm in 2017.  No mural thickening of the bowel ducts is identified. Lack of IV contrast limits assessment for mucosal enhancement. Patient reportedly received IV contrast earlier for a chest CT. On that exam, no definite mucosal enhancement is noted. Pancreas: Stable 2.7 cm cystic lesion in the proximal body of the pancreas. No ductal dilatation is noted. Spleen: Normal in size without focal abnormality. Adrenals/Urinary Tract: Normal bilateral adrenal glands. Small left renal cysts are again visualized. Symmetric pyelograms noted bilaterally status post prior CT administration for the chest. No obstructive uropathy. Contrast opacified urine distended bladder. Stomach/Bowel: All the stomach and small intestine are normal. There is a moderate amount of fecal retention throughout large bowel with circular muscle hypertrophy and extensive sigmoid diverticulosis noted similar in appearance to prior. The appendix is not visualized but no pericecal inflammation is noted. Vascular/Lymphatic: Diffuse atherosclerosis of the abdominal aorta and branch vessels without aneurysm. No retroperitoneal or pelvic sidewall adenopathy. No inguinal lymphadenopathy. Reproductive: Uterus and bilateral adnexa are unremarkable. Other: No abdominal wall hernia or abnormality. No abdominopelvic ascites. Musculoskeletal: Thoracolumbar spondylosis with multilevel degenerative disc disease along the lumbar spine. Osteopenia is noted. No acute fracture nor bone destruction. IMPRESSION: 1. Coronary arteriosclerosis and aortic atherosclerosis. 2. Extensive sigmoid diverticulosis without acute diverticulitis. 3.  Status post cholecystectomy with chronic intrahepatic and extrahepatic ductal dilatation without mural thickening or enhancement seen on recent same day CT with IV contrast. No conclusive findings of cholangitis. 4. Stable 2.7 cm pancreatic head cyst and small left-sided renal cysts. 5. Spondylosis of the lumbar spine. Electronically Signed   By: Ashley Royalty M.D.   On: 08/09/2016 00:04   Ct Angio Chest Pe W Or Wo Contrast  Result Date: 08/08/2016 CLINICAL DATA:  83 year old with acute onset of chest pain, shortness of breath and nausea and vomiting that began earlier today. EXAM: CT ANGIOGRAPHY CHEST WITH CONTRAST TECHNIQUE: Multidetector CT imaging of the chest was performed using the standard protocol during bolus administration of intravenous contrast. Multiplanar CT image reconstructions and MIPs were obtained to evaluate the vascular anatomy. CONTRAST:  100 mL Isovue 370 IV. COMPARISON:  Unenhanced CT chest 01/13/2015. FINDINGS: Beam hardening streak artifact is present as the patient was unable to raise the arms over the head. Respiratory motion blurred many of the images. Cardiovascular: Contrast opacification of the pulmonary arteries is very good. No filling defects within either main pulmonary artery or their segmental branches in either lung to suggest pulmonary embolism. Heart moderately enlarged with left ventricular predominance. Severe LAD and right coronary artery atherosclerosis. No pericardial effusion. Severe atherosclerosis involving the thoracic and upper abdominal aorta without evidence of aneurysm or dissection. Gas is present within the right subclavian vein and the lower right internal jugular vein, possibly related to an intravenous injection in the right upper extremity. Mediastinum/Nodes: Scattered normal sized mediastinal lymph nodes. No pathologically enlarged mediastinal, hilar or axillary lymph nodes. No mediastinal masses. Normal-appearing esophagus. Visualized thyroid gland  unremarkable. Lungs/Pleura: Low lung volumes. Scattered areas of localized hyperlucency in both lungs indicating focal air trapping. Scarring in the lingula and in the lower lobes. No confluent airspace consolidation. No significant pulmonary parenchymal nodules or masses, as there is a stable subpleural nodule adjacent to the right major fissure (series 7, image 45), indicating a subpleural lymph node. No pleural effusions. Subpleural fibrosis is present bilaterally, most prominent in the lingula. Central airways patent. Upper Abdomen: Small hiatal hernia. Prior cholecystectomy which accounts for the dilated proximal common bile duct. Scarring and a simple cyst is present in the  upper pole of the visualized left kidney. Visualized upper abdomen otherwise unremarkable for the early phase of enhancement. Musculoskeletal: Lower cervical degenerative disc disease and spondylosis. Osseous demineralization. No acute abnormalities. Review of the MIP images confirms the above findings. IMPRESSION: 1. No evidence of pulmonary embolism. 2. Moderate cardiomegaly. LAD and right coronary artery atherosclerosis. 3. Severe atherosclerosis involving the thoracic and upper abdominal aorta without aneurysm. 4. Low lung volumes. Scattered areas of localized air trapping in both lungs as can be seen in patients with asthma or bronchitis. Chronic lung changes as detailed above No acute cardiopulmonary disease otherwise. 5. Small hiatal hernia. Electronically Signed   By: Evangeline Dakin M.D.   On: 08/08/2016 20:47   Dg Chest Portable 1 View  Result Date: 08/08/2016 CLINICAL DATA:  Dyspnea and EXAM: PORTABLE CHEST 1 VIEW COMPARISON:  03/14/2016 FINDINGS: The heart size and mediastinal contours are within normal limits. Aortic atherosclerosis is noted. No pneumonic consolidation, effusion or pneumothorax. Chronic subpleural interstitial prominence may reflect areas of fibrosis, scarring and/or chronic interstitial lung disease.  Osteoarthritis of both glenohumeral joints with spurring and joint space narrowing. Slight remodeling along the undersurface of the distal acromion and clavicle on the left. This can be seen with chronic rotator cuff tear or rheumatoid arthritis. IMPRESSION: 1. Aortic atherosclerosis. 2. Mild stable interstitial prominence which may reflect subpleural fibrosis, scarring or sequela of chronic interstitial disease. 3. No acute pneumonic consolidation or CHF. Electronically Signed   By: Ashley Royalty M.D.   On: 08/08/2016 19:52    EKG: Independently reviewed. Artifact, no A fib/flutter.  In sinus rhythm on monitor.  Echocardiogram 2016: Report reviewed EF 60% PAP 39         Assessment/Plan  1. Sepsis from cholangitis:  Patient meets criteria given tachycardia, tachypnea, fever, and evidence of organ dysfunction.  Lactate 4.8 mmol/L and repeat ordered within 6 hours.  This patient is at high risk of poor outcomes with a qSOFA score of 2.  Antibiotics delivered in the ED.    -Sepsis bundle utilized:  -Blood and urine cultures drawn  -30 ml/kg bolus given in ED, will repeat lactic acid  -Antibiotics: Zosyn  -Repeat renal function and complete blood count in AM  -Code SEPSIS called to [place]  -Consult to GI re: ERCP  -RUQ [place] ordered  -NPO, MIVF  -IV dilaudid for pain, ondansetron for nausea   2. Elevated troponin:  Suspect demand, doubt ACS given slow rise. -Continue trend, monitor on telemetry  3. CKD III:  Baseline Cr 1.1, stable -IV fluids and trend Cr  4. Hypertension, chronic diastolic CHF:  -Hold Lasix -Hold amlodipine, lisinopril, metoprolol until hemodynamics are more clear -Continue aspirin  5. Type 2 diabetes:  -Hold orals -SSI every 4 hours  6. Bipolar:  -Follow-up lithium level -Continue the vilazodone, lithium  7. Other medications:  -Hold mirtazapine, Lyrica until mentation improves     DVT prophylaxis: Lovenox  Code Status: DO NOT RESUSCITATE   Family Communication: Daughter, POA by phone.  CODE STATUS confirmed.  Disposition Plan: Anticipate IV fluids, broad spectrum antibiotics, GI consultation re: ERCP.   Consults called: GI, Dr. Amedeo Plenty, discussed above plan Admission status: INPATIENT    Medical decision making: Patient seen at 12:30 AM on 08/09/2016.  The patient was discussed with Dr. Billy Fischer.  What exists of the patient's chart was reviewed in depth and summarized above.  Clinical condition: sepsis, suspect degree of lactate elevation is from liver dysfunction in setting of cholangitis.  Edwin Dada [facility] Pager [phone]      At the time of admission, it appears that the appropriate admission status for this patient is INPATIENT. This is judged to be reasonable and necessary in order to provide the required intensity of service to ensure the patient's safety given the presenting symptoms, physical exam findings, and initial radiographic and laboratory data in the context of their chronic comorbidities.  Together, these circumstances are felt to place her at high risk for further clinical deterioration threatening life, limb, or organ.   Patient requires inpatient status due to high intensity of service, high risk for further deterioration and high frequency of surveillance required because of this acute illness that poses a threat to life, limb or bodily function.  Factors to support inpatient status including sepsis, advanced age, evidence of liver and cardiac injury, and elevated lactic acid  I certify that at the point of admission it is my clinical judgment that the patient will require inpatient hospital care spanning beyond 2 midnights from the point of admission and that early discharge would result in unnecessary risk of decompensation and readmission or threat to life, limb or bodily function.

## 2016-08-09 NOTE — Op Note (Signed)
[facility] Patient Name: [name] Procedure Date : 08/09/2016 MRN: [id number] Attending MD: Ronnette Juniper , MD Date of Birth: 11-01-1929 CSN: [id number] Age: 83 Admit Type: Outpatient Procedure:                ERCP Indications:              Epigastric abdominal pain, Abdominal pain in the                            right upper quadrant, For therapy of ascending                            cholangitis, Jaundice, Elevated liver enzymes Providers:                Ronnette Juniper, MD, Carolynn Comment, RN, Corliss Parish, Technician Referring MD:              Medicines:                Monitored Anesthesia Care Complications:            No immediate complications. Estimated Blood Loss:     Estimated blood loss was minimal. Procedure:                Pre-Anesthesia Assessment:                           - Prior to the procedure, a History and Physical                            was performed, and patient medications and                            allergies were reviewed. The patient's tolerance of                            previous anesthesia was also reviewed. The risks                            and benefits of the procedure and the sedation                            options and risks were discussed with the patient.                            All questions were answered, and informed consent                            was obtained. Prior Anticoagulants: The patient has                            taken aspirin, last dose was day of procedure. ASA  Grade Assessment: III - A patient with severe                            systemic disease. After reviewing the risks and                            benefits, the patient was deemed in satisfactory                            condition to undergo the procedure.                           After obtaining informed consent, the scope was                            passed under direct vision.  Throughout the                            procedure, the patient's blood pressure, pulse, and                            oxygen saturations were monitored continuously. The                            Duodenoscope was introduced through the mouth, and                            used to inject contrast into and used to inject                            contrast into the bile duct. The ERCP was                            accomplished without difficulty. The patient                            tolerated the procedure well. Scope In: Scope Out: Findings:      The scout film was normal. The esophagus was successfully intubated       under direct vision. The scope was advanced to a normal major papilla in       the descending duodenum without detailed examination of the pharynx,       larynx and associated structures, and upper GI tract. The upper GI tract       was grossly normal. The major papilla was on the rim of a diverticulum.       The major papilla was flat. The bile duct was deeply cannulated with the       sphincterotome. Contrast was injected. I personally interpreted the bile       duct images. There was brisk flow of contrast through the ducts. Image       quality was excellent. Contrast extended to the main bile duct. The main       bile duct was markedly dilated, uncertain etiology. The largest diameter       was 20 mm. A cholecystectomy had been  performed. A straight Roadrunner       wire was passed into the biliary tree. A 15 mm biliary sphincterotomy       was made with a braided sphincterotome using ERBE electrocautery. There       was self limited oozing from the sphincterotomy which did not require       treatment. The biliary tree was swept with a 15 mm balloon starting at       the upper third of the main bile duct. Pus was swept from the duct. One       7 Fr by 5 cm plastic stent with a single external flap and a single       internal flap was placed 4.5 cm into the common  bile duct. Pus flowed       through the stent. The stent was in good position. Impression:               - The major papilla was on the rim of a                            diverticulum.                           - The major papilla appeared to be flat.                           - The entire main bile duct was markedly dilated,                            uncertain etiology.                           - The patient has had a cholecystectomy.                           - A biliary sphincterotomy was performed.                           - The biliary tree was swept and pus was found.                           - One plastic stent was placed into the common bile                            duct. Recommendation:           - Full liquid diet today.                           - Resume regular diet from tomorrow if abdominal                            pain has improved.                           - Continue present medications(antibiotics and IVF).                           -  Return to referring gastroenterologist for stent                            removal at UGI endoscopy in 1 month.                           - Check liver enzymes (AST, ALT, alkaline                            phosphatase, bilirubin) in the morning. Procedure Code(s):        --- Professional ---                           [phone], Endoscopic retrograde                            cholangiopancreatography (ERCP); with placement of                            endoscopic stent into biliary or pancreatic duct,                            including pre- and post-dilation and guide wire                            passage, when performed, including sphincterotomy,                            when performed, each stent                           [id number], Endoscopic catheterization of the biliary                            ductal system, radiological supervision and                            interpretation Diagnosis Code(s):        --- Professional  ---                           K83.9, Disease of biliary tract, unspecified                           Z90.49, Acquired absence of other specified parts                            of digestive tract                           R10.13, Epigastric pain                           R10.11, Right upper quadrant pain                           K83.0, Cholangitis  R17, Unspecified jaundice                           R74.8, Abnormal levels of other serum enzymes                           K83.8, Other specified diseases of biliary tract CPT copyright 2016 [employer]. All rights reserved. The codes documented in this report are preliminary and upon coder review may  be revised to meet current compliance requirements. Ronnette Juniper, MD 08/09/2016 1:53:46 PM This report has been signed electronically. Number of Addenda: 0

## 2016-08-09 NOTE — Brief Op Note (Signed)
08/08/2016 - 08/09/2016  1:56 PM  PATIENT:  [name]  83 y.o. female  PRE-OPERATIVE DIAGNOSIS:  Cholangitis, dilated CBD, abnormal LFTs, fever, abdominal pain  POST-OPERATIVE DIAGNOSIS:  sphinterotomy, balloon sweeep, stent placement  PROCEDURE:  Procedure(s) with comments: ENDOSCOPIC RETROGRADE CHOLANGIOPANCREATOGRAPHY (ERCP) (N/A) - prone position  SURGEON:  Surgeon(s) and Role:    Ronnette Juniper, MD - Primary  PHYSICIAN ASSISTANT:   ASSISTANTS: none   ANESTHESIA:   MAC  EBL:  Total I/O In: 50 [IV Piggyback:50] Out: -   BLOOD ADMINISTERED:none  DRAINS: none   LOCAL MEDICATIONS USED:  NONE  SPECIMEN:  No Specimen  DISPOSITION OF SPECIMEN:  N/A  COUNTS:  YES  TOURNIQUET:  * No tourniquets in log *  DICTATION: .Dragon Dictation  PLAN OF CARE: Admit to inpatient   PATIENT DISPOSITION:  PACU - hemodynamically stable.   Delay start of Pharmacological VTE agent (>24hrs) due to surgical blood loss or risk of bleeding: no

## 2016-08-09 NOTE — Progress Notes (Signed)
PHARMACY - PHYSICIAN COMMUNICATION CRITICAL VALUE ALERT - BLOOD CULTURE IDENTIFICATION (BCID)  Results for orders placed or performed during the hospital encounter of 12/18/15  Blood Culture ID Panel (Reflexed) (Collected: 12/18/2015  7:07 PM)  Result Value Ref Range   Enterococcus species NOT DETECTED NOT DETECTED   Listeria monocytogenes NOT DETECTED NOT DETECTED   Staphylococcus species NOT DETECTED NOT DETECTED   Staphylococcus aureus NOT DETECTED NOT DETECTED   Streptococcus species NOT DETECTED NOT DETECTED   Streptococcus agalactiae NOT DETECTED NOT DETECTED   Streptococcus pneumoniae NOT DETECTED NOT DETECTED   Streptococcus pyogenes NOT DETECTED NOT DETECTED   Acinetobacter baumannii NOT DETECTED NOT DETECTED   Enterobacteriaceae species DETECTED (A) NOT DETECTED   Enterobacter cloacae complex NOT DETECTED NOT DETECTED   Escherichia coli NOT DETECTED NOT DETECTED   Klebsiella oxytoca NOT DETECTED NOT DETECTED   Klebsiella pneumoniae DETECTED (A) NOT DETECTED   Proteus species NOT DETECTED NOT DETECTED   Serratia marcescens NOT DETECTED NOT DETECTED   Carbapenem resistance NOT DETECTED NOT DETECTED   Haemophilus influenzae NOT DETECTED NOT DETECTED   Neisseria meningitidis NOT DETECTED NOT DETECTED   Pseudomonas aeruginosa NOT DETECTED NOT DETECTED   Candida albicans NOT DETECTED NOT DETECTED   Candida glabrata NOT DETECTED NOT DETECTED   Candida krusei NOT DETECTED NOT DETECTED   Candida parapsilosis NOT DETECTED NOT DETECTED   Candida tropicalis NOT DETECTED NOT DETECTED    Name of physician (or Provider) Contacted: Dr Thereasa Solo  Changes to prescribed antibiotics required: None patient already on zosyn  Levester Fresh, PharmD, BCPS, BCCCP Clinical Pharmacist Clinical phone for 08/09/2016 from 7a-3:30p: [phone] If after 3:30p, please call main pharmacy at: [phone] 08/09/2016 10:20 AM

## 2016-08-09 NOTE — Care Management Note (Signed)
Case Management Note  Patient Details  Name: [name] MRN: [id number] Date of Birth: [birth year]  Subjective/Objective:                    Action/Plan:   PTA from home with husband.  Daughters at bedside.  Pt uses walker to walk in the home.  Per daughters pt has PCP and denied barriers to obtaining prescribed medications   Expected Discharge Date:                  Expected Discharge Plan:     In-House Referral:     Discharge planning Services  CM Consult  Post Acute Care Choice:    Choice offered to:     DME Arranged:    DME Agency:     HH Arranged:    HH Agency:     Status of Service:     If discussed at [employer], dates discussed:    Additional Comments:  Maryclare Labrador, RN 08/09/2016, 10:22 AM

## 2016-08-09 NOTE — Consult Note (Signed)
[facility] Consult  Referring Provider: Edwin Dada, MD Primary Care Physician:  Lavone Orn, MD Primary Gastroenterologist: Dr.Outlaw  Reason for Consultation: Cholangitis  HPI: [name] is a 83 y.o. Caucasian female was admitted overnight with abdominal pain, abnormal LFTs and fever suspicious for cholangitis. Patient is somnolent (likely from narcotic she is received for pain), easily arousable but it is difficult to get a complete history from her. I spoke with the daughter Bernardo Heater at [phone] to obtain further details.  Patient complained of epigastric pain 2 days ago which was self-limited, however, at 5:30 PM yesterday she called her daughter and stated that she had pain in the upper abdomen, epigastric area and under her right breast that radiated to her back. This was associated with nausea. Her daughter suggested calling 911 which the patient refused. Eventually her daughter brought her to the ER, en route she had an episode of vomiting. Patient states she was febrile at home.  There is no history of recent change in medications, excessive use of over-the-counter medications, use of herbal products, recent travel or sick contacts.  The patient and family members deny noticing yellowish discoloration of skin, or change in the color of stool or urine. Patient does take Tylenol at night on a regular basis.  She had a cholecystectomy in October 2017 for chronic cholecystitis.  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  CAT scan on this admission shows chronic stable dilatation of intra-and extrahepatic biliary duct without obvious calculus. Increased dilatation of CBD to 2.1 cm. Stable 2.7 cm cystic lesion in proximal body of pancreas without ductal  dilatation  Past Medical History:  Diagnosis Date  . Chest pain    a. 07/2014 Neg MV, nl EF.  Marland Kitchen Chronic interstitial cystitis   . Chronic low back pain   . CKD (chronic kidney disease), stage III   . Diabetes mellitus without complication ([place])   . GERD (gastroesophageal reflux disease)   . Hyperlipidemia   . Hypertension   . Macular degeneration   . Microhematuria   . Osteopenia   . Pernicious anemia   . Postlaminectomy syndrome   . PVC (premature ventricular contraction)   . Right carpal tunnel syndrome     Past Surgical History:  Procedure Laterality Date  . BACK SURGERY    . CHOLECYSTECTOMY N/A 12/21/2015   Procedure: LAPAROSCOPIC CHOLECYSTECTOMY WITH INTRAOPERATIVE CHOLANGIOGRAM;  Surgeon: Alphonsa Overall, MD;  Location: [facility];  Service: General;  Laterality: N/A;  . EUS N/A 02/23/2015   Procedure: ESOPHAGEAL ENDOSCOPIC ULTRASOUND (EUS) RADIAL;  Surgeon: Arta Silence, MD;  Location: [facility];  Service: Endoscopy;  Laterality: N/A;  . JOINT REPLACEMENT Left    hip and knee    Prior to Admission medications   Medication Sig Start Date End Date Taking? Authorizing Provider  acetaminophen (TYLENOL) 500 MG tablet Take 500 mg by mouth every 6 (six) hours as needed for mild pain.    [provider]  amLODipine (NORVASC) 5 MG tablet Take 1 tablet (5 mg total) by mouth daily. 11/21/14   Rai, Vernelle Emerald, MD  aspirin EC 81 MG tablet Take 81 mg by mouth daily.    [provider]  cholecalciferol (VITAMIN D) 1000 UNITS tablet Take 2,000 Units by mouth daily.     [provider]  conjugated estrogens (PREMARIN) vaginal cream  Place 1 Applicatorful vaginally daily as needed (burning).     [provider]  cyanocobalamin (,VITAMIN B-12,) 1000 MCG/ML injection Inject 1,000 mcg into the muscle every 30 (thirty) days.    [provider]  feeding supplement, ENSURE ENLIVE, (ENSURE ENLIVE) LIQD Take 237 mLs by mouth 2 (two) times daily between  meals. 03/15/16   Dhungel, Flonnie Overman, MD  furosemide (LASIX) 20 MG tablet Take 2 tablets (40 mg total) by mouth daily. Patient taking differently: Take 20 mg by mouth daily.  02/08/15   Domenic Polite, MD  glimepiride (AMARYL) 1 MG tablet Take 2 tablets (2 mg total) by mouth every morning. 01/17/15   Charlynne Cousins, MD  guaiFENesin (ROBITUSSIN) 100 MG/5ML SOLN Take 5 mLs (100 mg total) by mouth every 4 (four) hours as needed for cough or to loosen phlegm. 03/15/16   Dhungel, Nishant, MD  lisinopril (PRINIVIL,ZESTRIL) 20 MG tablet Take 1 tablet (20 mg total) by mouth every evening. 07/22/14   Cristal Ford, DO  lithium carbonate 150 MG capsule Take 150 mg by mouth daily.    [provider]  metoprolol (LOPRESSOR) 50 MG tablet Take 1 tablet (50 mg total) by mouth 2 (two) times daily. 07/22/14   Mikhail, Velta Addison, DO  mirtazapine (REMERON) 30 MG tablet Take 30 mg by mouth at bedtime.     [provider]  nitroGLYCERIN (NITROSTAT) 0.4 MG SL tablet Place 1 tablet (0.4 mg total) under the tongue every 5 (five) minutes as needed for chest pain. 07/22/14   Mikhail, Velta Addison, DO  omeprazole (PRILOSEC) 40 MG capsule Take 40 mg by mouth every evening.     [provider]  pregabalin (LYRICA) 75 MG capsule Take 75 mg by mouth 2 (two) times daily.    [provider]  traMADol (ULTRAM) 50 MG tablet Take 1 tablet (50 mg total) by mouth 2 (two) times daily as needed (pain). Patient taking differently: Take 50 mg by mouth 2 (two) times daily.  07/22/14   Mikhail, Velta Addison, DO  Vilazodone HCl 20 MG TABS Take 20 mg by mouth daily. Viibryd    [provider]  zolpidem (AMBIEN) 5 MG tablet Take 2.5 mg by mouth at bedtime.     [provider]    Current Facility-Administered Medications  Medication Dose Route Frequency Provider Last Rate Last Dose  . 0.9 %  sodium chloride infusion   Intravenous Continuous Edwin Dada, MD 125 mL/hr at 08/09/16 0301    .  aspirin EC tablet 81 mg  81 mg Oral Daily Danford, Christopher P, MD      . enoxaparin (LOVENOX) injection 30 mg  30 mg Subcutaneous Q24H Danford, Christopher P, MD      . HYDROmorphone (DILAUDID) injection 1 mg  1 mg Intravenous Q4H PRN Danford, Suann Larry, MD      . insulin aspart (novoLOG) injection 0-9 Units  0-9 Units Subcutaneous Q4H Edwin Dada, MD   3 Units at 08/09/16 [phone]  . lithium carbonate capsule 150 mg  150 mg Oral Daily Danford, Suann Larry, MD      . ondansetron (ZOFRAN) tablet 4 mg  4 mg Oral Q6H PRN Danford, Suann Larry, MD       Or  . ondansetron (ZOFRAN) injection 4 mg  4 mg Intravenous Q6H PRN Danford, Suann Larry, MD      . piperacillin-tazobactam (ZOSYN) IVPB 3.375 g  3.375 g Intravenous Q8H Honor Loh, [place]   Stopped at 08/09/16 0703  . Vilazodone  HCl TABS 20 mg  20 mg Oral Daily Danford, Suann Larry, MD        Allergies as of 08/08/2016 - Review Complete 08/08/2016  Allergen Reaction Noted  . Codeine Other (See Comments) 08/31/2013    Family History  Problem Relation Age of Onset  . Arthritis Mother   . Heart attack Father     Social History   Social History  . Marital status: Married    Spouse name: N/A  . Number of children: N/A  . Years of education: N/A   Occupational History  . retired    Social History Main Topics  . Smoking status: Never Smoker  . Smokeless tobacco: Never Used  . Alcohol use No  . Drug use: No  . Sexual activity: Not on file   Other Topics Concern  . Not on file   Social History Narrative  . No narrative on file    Review of Systems: Positive for: Abdominal pain, nausea, vomiting, fever GI: Described in detail in HPI.    Gen: Positive for fever Denies any anorexia, fatigue, weakness, malaise, involuntary weight loss, and sleep disorder CV: Denies chest pain, angina, palpitations, syncope, orthopnea, PND, peripheral edema, and claudication. Resp: Denies dyspnea, cough, sputum, wheezing,  coughing up blood. GU : Denies urinary burning, blood in urine, urinary frequency, urinary hesitancy, nocturnal urination, and urinary incontinence. MS: Denies joint pain or swelling.  Denies muscle weakness, cramps, atrophy.  Derm: Denies rash, itching, oral ulcerations, hives, unhealing ulcers.  Psych: Denies depression, anxiety, memory loss, suicidal ideation, hallucinations,  and confusion. Heme: Denies bruising, bleeding, and enlarged lymph nodes. Neuro:  Denies any headaches, dizziness, paresthesias. Endo: Has diabetes Denies any problems with thyroid, adrenal function.  Physical Exam: Vital signs in last 24 hours: Temp:  [99.2 F (37.3 C)-102.3 F (39.1 C)] 99.2 F (37.3 C) (06/07 0340) Pulse Rate:  [73-111] 73 (06/07 0500) Resp:  [22-49] 24 (06/07 0500) BP: (98-191)/(34-89) 98/34 (06/07 0500) SpO2:  [88 %-100 %] 100 % (06/07 0500) Weight:  [59.9 kg (132 lb)-65.1 kg (143 lb 8.3 oz)] 65.1 kg (143 lb 8.3 oz) (06/07 0500)    General:   Somnolent but arousable,  Well-developed, well-nourished, pleasant and cooperative in NAD Head:  Normocephalic and atraumatic. Eyes:  Sclera clear, mild icterus icterus.   Conjunctiva pink. Ears:  Normal auditory acuity. Nose:  No deformity, discharge,  or lesions. Mouth:  No deformity or lesions.  Oropharynx pink & moist. Neck:  Supple; no masses or thyromegaly. Lungs:  Clear throughout to auscultation.   No wheezes, crackles, or rhonchi. No acute distress. Heart:  Regular rate and rhythm; no murmurs, clicks, rubs,  or gallops. Extremities:  Without clubbing or edema. Neurologic:  Alert and  oriented x4;  grossly normal neurologically. Skin:  Intact without significant lesions or rashes. Psych:  Alert and cooperative. Normal mood and affect. Abdomen:  Voluntary guarding , mild generalized tenderness , more pronounced in epigastric and right upper quadrant ,Soft,nondistended. No masses, hepatosplenomegaly or hernias noted. Normal bowel sounds.           Lab Results:  Recent Labs  08/08/16 1845 08/09/16 0441  WBC 9.5 15.3*  HGB 13.0 10.6*  HCT 40.3 32.5*  PLT 214 166   BMET  Recent Labs  08/08/16 1845 08/09/16 0441  NA 135 139  K 3.9 3.4*  CL 96* 102  CO2 24 24  GLUCOSE 271* 248*  BUN 32* 29*  CREATININE 1.24* 1.44*  CALCIUM 9.6 8.8*  LFT  Recent Labs  08/08/16 1845 08/09/16 0441  PROT 7.4 5.5*  ALBUMIN 3.8 2.9*  AST 2,610* 1,391*  ALT 1,484* 1,270*  ALKPHOS 423* 331*  BILITOT 2.6* 3.7*  BILIDIR 1.6*  --   IBILI 1.0*  --    PT/INR  Recent Labs  08/08/16 2323  LABPROT 14.5  INR 1.13    Studies/Results: Ct Abdomen Pelvis Wo Contrast  Result Date: 08/09/2016 CLINICAL DATA:  Dyspnea and nausea starting today. Elevated lactic acid levels. EXAM: CT ABDOMEN AND PELVIS WITHOUT CONTRAST TECHNIQUE: Multidetector CT imaging of the abdomen and pelvis was performed following the standard protocol without IV contrast. COMPARISON:  01/03/2016 CT and chest CT performed earlier on the same day FINDINGS: Lower chest: Borderline cardiomegaly with bibasilar atelectasis and/or scarring. No pericardial effusion. There is coronary arteriosclerosis. Hepatobiliary: Status post cholecystectomy with chronic stable dilatation of the intra and extrahepatic biliary ducts without calculus noted. The common bile duct is slightly more dilated on current exam up to 2.1 cm at the pancreatic head versus 1.7 cm in 2017. No mural thickening of the bowel ducts is identified. Lack of IV contrast limits assessment for mucosal enhancement. Patient reportedly received IV contrast earlier for a chest CT. On that exam, no definite mucosal enhancement is noted. Pancreas: Stable 2.7 cm cystic lesion in the proximal body of the pancreas. No ductal dilatation is noted. Spleen: Normal in size without focal abnormality. Adrenals/Urinary Tract: Normal bilateral adrenal glands. Small left renal cysts are again visualized. Symmetric pyelograms noted  bilaterally status post prior CT administration for the chest. No obstructive uropathy. Contrast opacified urine distended bladder. Stomach/Bowel: All the stomach and small intestine are normal. There is a moderate amount of fecal retention throughout large bowel with circular muscle hypertrophy and extensive sigmoid diverticulosis noted similar in appearance to prior. The appendix is not visualized but no pericecal inflammation is noted. Vascular/Lymphatic: Diffuse atherosclerosis of the abdominal aorta and branch vessels without aneurysm. No retroperitoneal or pelvic sidewall adenopathy. No inguinal lymphadenopathy. Reproductive: Uterus and bilateral adnexa are unremarkable. Other: No abdominal wall hernia or abnormality. No abdominopelvic ascites. Musculoskeletal: Thoracolumbar spondylosis with multilevel degenerative disc disease along the lumbar spine. Osteopenia is noted. No acute fracture nor bone destruction. IMPRESSION: 1. Coronary arteriosclerosis and aortic atherosclerosis. 2. Extensive sigmoid diverticulosis without acute diverticulitis. 3. Status post cholecystectomy with chronic intrahepatic and extrahepatic ductal dilatation without mural thickening or enhancement seen on recent same day CT with IV contrast. No conclusive findings of cholangitis. 4. Stable 2.7 cm pancreatic head cyst and small left-sided renal cysts. 5. Spondylosis of the lumbar spine. Electronically Signed   By: Ashley Royalty M.D.   On: 08/09/2016 00:04   Ct Angio Chest Pe W Or Wo Contrast  Result Date: 08/08/2016 CLINICAL DATA:  83 year old with acute onset of chest pain, shortness of breath and nausea and vomiting that began earlier today. EXAM: CT ANGIOGRAPHY CHEST WITH CONTRAST TECHNIQUE: Multidetector CT imaging of the chest was performed using the standard protocol during bolus administration of intravenous contrast. Multiplanar CT image reconstructions and MIPs were obtained to evaluate the vascular anatomy. CONTRAST:  100 mL  Isovue 370 IV. COMPARISON:  Unenhanced CT chest 01/13/2015. FINDINGS: Beam hardening streak artifact is present as the patient was unable to raise the arms over the head. Respiratory motion blurred many of the images. Cardiovascular: Contrast opacification of the pulmonary arteries is very good. No filling defects within either main pulmonary artery or their segmental branches in either lung to suggest pulmonary embolism.  Heart moderately enlarged with left ventricular predominance. Severe LAD and right coronary artery atherosclerosis. No pericardial effusion. Severe atherosclerosis involving the thoracic and upper abdominal aorta without evidence of aneurysm or dissection. Gas is present within the right subclavian vein and the lower right internal jugular vein, possibly related to an intravenous injection in the right upper extremity. Mediastinum/Nodes: Scattered normal sized mediastinal lymph nodes. No pathologically enlarged mediastinal, hilar or axillary lymph nodes. No mediastinal masses. Normal-appearing esophagus. Visualized thyroid gland unremarkable. Lungs/Pleura: Low lung volumes. Scattered areas of localized hyperlucency in both lungs indicating focal air trapping. Scarring in the lingula and in the lower lobes. No confluent airspace consolidation. No significant pulmonary parenchymal nodules or masses, as there is a stable subpleural nodule adjacent to the right major fissure (series 7, image 45), indicating a subpleural lymph node. No pleural effusions. Subpleural fibrosis is present bilaterally, most prominent in the lingula. Central airways patent. Upper Abdomen: Small hiatal hernia. Prior cholecystectomy which accounts for the dilated proximal common bile duct. Scarring and a simple cyst is present in the upper pole of the visualized left kidney. Visualized upper abdomen otherwise unremarkable for the early phase of enhancement. Musculoskeletal: Lower cervical degenerative disc disease and  spondylosis. Osseous demineralization. No acute abnormalities. Review of the MIP images confirms the above findings. IMPRESSION: 1. No evidence of pulmonary embolism. 2. Moderate cardiomegaly. LAD and right coronary artery atherosclerosis. 3. Severe atherosclerosis involving the thoracic and upper abdominal aorta without aneurysm. 4. Low lung volumes. Scattered areas of localized air trapping in both lungs as can be seen in patients with asthma or bronchitis. Chronic lung changes as detailed above No acute cardiopulmonary disease otherwise. 5. Small hiatal hernia. Electronically Signed   By: Evangeline Dakin M.D.   On: 08/08/2016 20:47   Dg Chest Portable 1 View  Result Date: 08/08/2016 CLINICAL DATA:  Dyspnea and EXAM: PORTABLE CHEST 1 VIEW COMPARISON:  03/14/2016 FINDINGS: The heart size and mediastinal contours are within normal limits. Aortic atherosclerosis is noted. No pneumonic consolidation, effusion or pneumothorax. Chronic subpleural interstitial prominence may reflect areas of fibrosis, scarring and/or chronic interstitial lung disease. Osteoarthritis of both glenohumeral joints with spurring and joint space narrowing. Slight remodeling along the undersurface of the distal acromion and clavicle on the left. This can be seen with chronic rotator cuff tear or rheumatoid arthritis. IMPRESSION: 1. Aortic atherosclerosis. 2. Mild stable interstitial prominence which may reflect subpleural fibrosis, scarring or sequela of chronic interstitial disease. 3. No acute pneumonic consolidation or CHF. Electronically Signed   By: Ashley Royalty M.D.   On: 08/08/2016 19:52    Impression: 1. Fever, abnormal LFTs, abdominal pain, dilated CBD suggestive of cholangitis 2. Elevation in LFTs(hepatocellular dysfunction more prominent than cholestasis), AST 2610 to 1391, AST 1484 to 1270, total bili 2.6 to 3.7, alkaline phosphatase 423 to 331. 3. Lactic acidosis(4.8 to 1.6 to 2.8) 4. Leukocytosis, normal platelet, normal  INR 5. Renal dysfunction(low GFR, elevated creatinine), normal CK total which makes rhabdomyolysis unlikely. 6. Hyperglycemia  Plan: 1. ERCP today for possible sphincterotomy. Patient known to have a large periampullary diverticulum, which may make cannulation technically difficult. Patient on normal saline at 125 mL per hour, patient received aspirin 81 mg last dose today. Already on Zosyn 3.375 g every 8 hours IV.s/p 1 dose IV vancomycin 1 g.  2. Will send viral serologies(HAV IgM, HBc IgM, HCV Ab, HSV IgM, CMV IgM and EBV IgM) 3. Normal coags, somnolence likely related to use of narcotics and not underlying encephalopathy.  Consent will be taken from patient's daughter, who is also her power of attorney.        LOS: 0 days   Ronnette Juniper, MD  08/09/2016, 8:26 AM  Pager [phone] If no answer or after 5 PM call [phone]

## 2016-08-09 NOTE — Anesthesia Postprocedure Evaluation (Signed)
Anesthesia Post Note  Patient: [name]  Procedure(s) Performed: Procedure(s) (LRB): ENDOSCOPIC RETROGRADE CHOLANGIOPANCREATOGRAPHY (ERCP) (N/A)     Patient location during evaluation: PACU Anesthesia Type: General Level of consciousness: awake Pain management: pain level controlled Respiratory status: spontaneous breathing Cardiovascular status: stable Anesthetic complications: no    Last Vitals:  Vitals:   08/09/16 1359 08/09/16 1414  BP: (!) 131/56 (!) 141/40  Pulse: 88 82  Resp: 20 18  Temp: 36.4 C     Last Pain:  Vitals:   08/09/16 1359  TempSrc: Axillary  PainSc:                  Owenn Rothermel

## 2016-08-09 NOTE — Anesthesia Procedure Notes (Signed)
Procedure Name: Intubation Date/Time: 08/09/2016 12:55 PM Performed by: Orlie Dakin Pre-anesthesia Checklist: Patient identified, Emergency Drugs available, Suction available, Patient being monitored and Timeout performed Patient Re-evaluated:Patient Re-evaluated prior to inductionOxygen Delivery Method: Circle system utilized Preoxygenation: Pre-oxygenation with 100% oxygen Intubation Type: IV induction Ventilation: Mask ventilation without difficulty Laryngoscope Size: Miller and 3 Grade View: Grade II Tube type: Oral Tube size: 7.0 mm Number of attempts: 1 Airway Equipment and Method: Stylet Placement Confirmation: ETT inserted through vocal cords under direct vision,  positive ETCO2 and breath sounds checked- equal and bilateral Secured at: 22 cm Tube secured with: Tape Dental Injury: Teeth and Oropharynx as per pre-operative assessment

## 2016-08-09 NOTE — Progress Notes (Signed)
CRITICAL VALUE ALERT  Critical Value:  Trop 1.03  Date & Time Notied:  08/09/2016 @ 07.45  Provider Notified: Dr. Thereasa Solo  Orders Received/Actions taken: none

## 2016-08-09 NOTE — Progress Notes (Signed)
[name] TEAM 1 - Stepdown/ICU TEAM  [name]  TDV:[id number] DOB: Jan 21, 1930 DOA: 08/08/2016 PCP: Lavone Orn, MD    Brief Narrative:  83 y.o. female with hx of Bipolar disorder, HTN, DM, CKD baseline Cr 1.1, CHF, and chronic pain (interstitial cystitis and chronic back pain) who presented with fever and epigastric pain.  In the ED she had a temp of 102.49F, AST/ALT 2610/1484, Total bilirubin 2.6, alkaline phosphatase >400, and normal Lipase.  CT angiogram of the chest showed a dilated CBD to 2 cm (similar to previous images), status post cholecystectomy.  Subjective: Pt is seen for a f/u visit.    Assessment & Plan:  Sepsis due to cholangitis  Gram-negative rod bacteremia  Elevated troponin  CKD stage III Baseline creatinine 1.1  Chronic diastolic congestive heart failure  HTN  DM 2  Bipolar disorder  MRSA screen +  DVT prophylaxis: Lovenox Code Status: NO CODE - DNR  Family Communication: no family present at time of exam  Disposition Plan:   Consultants:  [facility]  Procedures: 6/7 ERCP - diffusely dilated CBD (2cm) w/ no visible filling defect - sphincterotomy performed leading to drainage of pus - CBD stent placed   Antimicrobials:  Zosyn 6/6 > Vancomycin 6/6  Objective: Blood pressure 138/65, pulse 85, temperature 98.7 F (37.1 C), temperature source Oral, resp. rate 20, height 5\' 5"  (1.651 m), weight 65.1 kg (143 lb 8.3 oz), SpO2 97 %.  Intake/Output Summary (Last 24 hours) at 08/09/16 1516 Last data filed at 08/09/16 1358  Gross per 24 hour  Intake             3400 ml  Output                0 ml  Net             3400 ml   Filed Weights   08/08/16 1921 08/09/16 0500  Weight: 59.9 kg (132 lb) 65.1 kg (143 lb 8.3 oz)    Examination: Pt was seen for a f/u visit.    CBC:  Recent Labs Lab 08/08/16 1845 08/09/16 0441  WBC 9.5 15.3*  NEUTROABS 7.9*  --   HGB 13.0 10.6*  HCT 40.3 32.5*  MCV 90.6 89.3  PLT 214 062   Basic Metabolic  Panel:  Recent Labs Lab 08/08/16 1845 08/09/16 0441  NA 135 139  K 3.9 3.4*  CL 96* 102  CO2 24 24  GLUCOSE 271* 248*  BUN 32* 29*  CREATININE 1.24* 1.44*  CALCIUM 9.6 8.8*   GFR: Estimated Creatinine Clearance: 25.7 mL/min (A) (by C-G formula based on SCr of 1.44 mg/dL (H)).  Liver Function Tests:  Recent Labs Lab 08/08/16 1845 08/09/16 0441  AST 2,610* 1,391*  ALT 1,484* 1,270*  ALKPHOS 423* 331*  BILITOT 2.6* 3.7*  PROT 7.4 5.5*  ALBUMIN 3.8 2.9*    Recent Labs Lab 08/08/16 1845  LIPASE 42    Coagulation Profile:  Recent Labs Lab 08/08/16 2323  INR 1.13    Cardiac Enzymes:  Recent Labs Lab 08/08/16 2323 08/09/16 0441  CKTOTAL 21*  --   TROPONINI 0.41* 1.03*    HbA1C: Hgb A1c MFr Bld  Date/Time Value Ref Range Status  01/04/2016 08:08 AM 7.8 (H) 4.8 - 5.6 % Final    Comment:    (NOTE)         Pre-diabetes: 5.7 - 6.4         Diabetes: >6.4  Glycemic control for adults with diabetes: <7.0   01/16/2015 03:14 PM 7.9 (H) 4.8 - 5.6 % Final    Comment:    (NOTE)         Pre-diabetes: 5.7 - 6.4         Diabetes: >6.4         Glycemic control for adults with diabetes: <7.0     CBG:  Recent Labs Lab 08/09/16 0411 08/09/16 0828 08/09/16 1126 08/09/16 1510  GLUCAP 238* 236* 181* 134*    Recent Results (from the past 240 hour(s))  Blood Culture (routine x 2)     Status: None (Preliminary result)   Collection Time: 08/08/16  7:20 PM  Result Value Ref Range Status   Specimen Description BLOOD RIGHT WRIST  Final   Special Requests   Final    BOTTLES DRAWN AEROBIC AND ANAEROBIC Blood Culture adequate volume   Culture  Setup Time   Final    GRAM NEGATIVE RODS IN BOTH AEROBIC AND ANAEROBIC BOTTLES CRITICAL VALUE NOTED.  VALUE IS CONSISTENT WITH PREVIOUSLY REPORTED AND CALLED VALUE.    Culture GRAM NEGATIVE RODS  Final   Report Status PENDING  Incomplete  Blood Culture (routine x 2)     Status: None (Preliminary result)    Collection Time: 08/08/16  8:01 PM  Result Value Ref Range Status   Specimen Description BLOOD LEFT ANTECUBITAL  Final   Special Requests IN PEDIATRIC BOTTLE Blood Culture adequate volume  Final   Culture  Setup Time   Final    GRAM NEGATIVE RODS IN PEDIATRIC BOTTLE Organism ID to follow CRITICAL RESULT CALLED TO, READ BACK BY AND VERIFIED WITH: Hughie Closs Pharm.D. 10:15 08/09/16 ([email or web address])    Culture GRAM NEGATIVE RODS  Final   Report Status PENDING  Incomplete  Blood Culture ID Panel (Reflexed)     Status: Abnormal   Collection Time: 08/08/16  8:01 PM  Result Value Ref Range Status   Enterococcus species NOT DETECTED NOT DETECTED Final   Listeria monocytogenes NOT DETECTED NOT DETECTED Final   Staphylococcus species NOT DETECTED NOT DETECTED Final   Staphylococcus aureus NOT DETECTED NOT DETECTED Final   Streptococcus species NOT DETECTED NOT DETECTED Final   Streptococcus agalactiae NOT DETECTED NOT DETECTED Final   Streptococcus pneumoniae NOT DETECTED NOT DETECTED Final   Streptococcus pyogenes NOT DETECTED NOT DETECTED Final   Acinetobacter baumannii NOT DETECTED NOT DETECTED Final   Enterobacteriaceae species DETECTED (A) NOT DETECTED Final    Comment: Enterobacteriaceae represent a large family of gram-negative bacteria, not a single organism. CRITICAL RESULT CALLED TO, READ BACK BY AND VERIFIED WITH: Hughie Closs Pharm.D. 10:15 08/09/16 ([email or web address])    Enterobacter cloacae complex NOT DETECTED NOT DETECTED Final   Escherichia coli NOT DETECTED NOT DETECTED Final   Klebsiella oxytoca NOT DETECTED NOT DETECTED Final   Klebsiella pneumoniae DETECTED (A) NOT DETECTED Final    Comment: CRITICAL RESULT CALLED TO, READ BACK BY AND VERIFIED WITH: Hughie Closs Pharm.D. 10:15 08/09/16 ([email or web address])    Proteus species NOT DETECTED NOT DETECTED Final   Serratia marcescens NOT DETECTED NOT DETECTED Final   Carbapenem resistance NOT DETECTED NOT DETECTED Final   Haemophilus influenzae NOT DETECTED NOT  DETECTED Final   Neisseria meningitidis NOT DETECTED NOT DETECTED Final   Pseudomonas aeruginosa NOT DETECTED NOT DETECTED Final   Candida albicans NOT DETECTED NOT DETECTED Final   Candida glabrata NOT DETECTED NOT DETECTED Final   Candida krusei NOT DETECTED NOT DETECTED Final   Candida  parapsilosis NOT DETECTED NOT DETECTED Final   Candida tropicalis NOT DETECTED NOT DETECTED Final  MRSA PCR Screening     Status: Abnormal   Collection Time: 08/09/16  4:07 AM  Result Value Ref Range Status   MRSA by PCR POSITIVE (A) NEGATIVE Final    Comment:        The GeneXpert MRSA Assay (FDA approved for NASAL specimens only), is one component of a comprehensive MRSA colonization surveillance program. It is not intended to diagnose MRSA infection nor to guide or monitor treatment for MRSA infections. RESULT CALLED TO, READ BACK BY AND VERIFIED WITH: D.CORREA RN AT 2831 08/09/16 BY A.DAVIS      Scheduled Meds: . aspirin EC  81 mg Oral Daily  . atorvastatin  5 mg Oral Daily  . Chlorhexidine Gluconate Cloth  6 each Topical Q0600  . enoxaparin (LOVENOX) injection  30 mg Subcutaneous Q24H  . FLUoxetine  20 mg Oral Daily  . insulin aspart  0-9 Units Subcutaneous Q4H  . lithium carbonate  150 mg Oral Daily  . mupirocin ointment  1 application Nasal BID  . oxybutynin  5 mg Oral BID  . Vilazodone HCl  20 mg Oral Daily  . zolpidem  2.5 mg Oral QHS     LOS: 0 days   Time spent: No Charge  Cherene Altes, MD [facility] Office  [phone] Pager - Text Page per Amion as per below:  On-Call/Text Page:      [email or web address]      password [email or web address]  If 7PM-7AM, please contact night-coverage [email or web address] Password [email or web address] 08/09/2016, 3:16 PM

## 2016-08-09 NOTE — Anesthesia Preprocedure Evaluation (Addendum)
Anesthesia Evaluation  Patient identified by MRN, date of birth, ID band Patient awake    Reviewed: Allergy & Precautions, NPO status , Patient's Chart, lab work & pertinent test results  Airway Mallampati: II  TM Distance: >3 FB     Dental   Pulmonary    breath sounds clear to auscultation       Cardiovascular hypertension, +CHF   Rhythm:Regular Rate:Normal     Neuro/Psych    GI/Hepatic Neg liver ROS, GERD  ,  Endo/Other  diabetes  Renal/GU Renal disease     Musculoskeletal   Abdominal   Peds  Hematology  (+) anemia ,   Anesthesia Other Findings   Reproductive/Obstetrics                             Anesthesia Physical Anesthesia Plan  ASA: III  Anesthesia Plan: General   Post-op Pain Management:    Induction: Intravenous  PONV Risk Score and Plan: Ondansetron and Propofol  Airway Management Planned: Oral ETT  Additional Equipment:   Intra-op Plan:   Post-operative Plan: Extubation in OR  Informed Consent: I have reviewed the patients History and Physical, chart, labs and discussed the procedure including the risks, benefits and alternatives for the proposed anesthesia with the patient or authorized representative who has indicated his/her understanding and acceptance.   Dental advisory given  Plan Discussed with: CRNA and Anesthesiologist  Anesthesia Plan Comments:         Anesthesia Quick Evaluation

## 2016-08-09 NOTE — Op Note (Signed)
ERCP was performed today. Ampulla was noted in the diverticular rim,in the second portion of the duodenum, visible only after lavage(obscured by retained food products). Deep cannulation, and contrast injection showed, diffusely dilated CBD up to 2 cm. There was no obvious filling defect. Sphincterotomy was performed and resulted in drainage of pus. After several balloon sweeps, a plastic CBD stent 5 cm 7 [place] was left, good drainage noted post-stent placement.  Recommend full liquid diet today, regular diet in a.m. if no abdominal pain. Continue IV Zosyn and IV fluids. Recommend monitoring LFTs in a.m.Marland Kitchen Patient will need an EGD in 1 month, as an outpatient for removal of CBD stent which will be scheduled.  Gari Crown

## 2016-08-09 NOTE — Transfer of Care (Signed)
Immediate Anesthesia Transfer of Care Note  Patient: [name]  Procedure(s) Performed: Procedure(s) with comments: ENDOSCOPIC RETROGRADE CHOLANGIOPANCREATOGRAPHY (ERCP) (N/A) - prone position  Patient Location: Endoscopy Unit  Anesthesia Type:General  Level of Consciousness: awake and patient cooperative  Airway & Oxygen Therapy: Patient Spontanous Breathing and Patient connected to nasal cannula oxygen  Post-op Assessment: Report given to RN and Post -op Vital signs reviewed and stable  Post vital signs: Reviewed and stable  Last Vitals:  Vitals:   08/09/16 1121 08/09/16 1359  BP: (!) 133/33 (!) 131/56  Pulse: 65 88  Resp: 16 20  Temp: 36.5 C 36.4 C    Last Pain:  Vitals:   08/09/16 1359  TempSrc: Axillary  PainSc:          Complications: No apparent anesthesia complications

## 2016-08-09 NOTE — Progress Notes (Signed)
Initial Nutrition Assessment  DOCUMENTATION CODES:   Not applicable  INTERVENTION:   -RD will follow for diet advancement and supplement as appropriate  NUTRITION DIAGNOSIS:   Inadequate oral intake related to altered GI function as evidenced by NPO status.  GOAL:   Patient will meet greater than or equal to 90% of their needs  MONITOR:   PO intake, Supplement acceptance, Diet advancement, Labs, Weight trends, Skin, I & O's  REASON FOR ASSESSMENT:   Malnutrition Screening Tool    ASSESSMENT:   [name] is a 83 y.o. female with a past medical history significant for Bipolar disorder, HTN, NIDDM, CKD baseline Cr 1.1, HFpEF and chronic pain (interstitial cystitis and chronic back pain) who presents with fever and epigastric pain.  Pt admitted with sepsis from cholangitis.   Attempted to see pt x 2, however, in with MD or out of room for procedure at times of visits. Pt to undergo ERCP with possible sphincterectomy today.   Unable to complete Nutrition-Focused physical exam at this time.   Reviewed wt hx, which revealed a 8.9% wt loss within the past 8 months.   Labs reviewed: K: 3.4, CBGS: 236-238.   Diet Order:  Diet NPO time specified Except for: Sips with Meds, Other (See Comments), Ice Chips  Skin:  Reviewed, no issues  Last BM:  PTA  Height:   Ht Readings from Last 1 Encounters:  08/09/16 5\' 5"  (1.651 m)    Weight:   Wt Readings from Last 1 Encounters:  08/09/16 143 lb 8.3 oz (65.1 kg)    Ideal Body Weight:  56.8 kg  BMI:  Body mass index is 23.88 kg/m.  Estimated Nutritional Needs:   Kcal:  1450-1650  Protein:  70-85 grams  Fluid:  1.4-1.6 L  EDUCATION NEEDS:   Education needs no appropriate at this time  Ailine Hefferan A. Jimmye Norman, RD, LDN, CDE Pager: [phone] After hours Pager: [phone]

## 2016-08-10 ENCOUNTER — Encounter (HOSPITAL_COMMUNITY): Payer: Self-pay

## 2016-08-10 DIAGNOSIS — E1121 Type 2 diabetes mellitus with diabetic nephropathy: Secondary | ICD-10-CM

## 2016-08-10 DIAGNOSIS — A414 Sepsis due to anaerobes: Secondary | ICD-10-CM

## 2016-08-10 DIAGNOSIS — I272 Pulmonary hypertension, unspecified: Secondary | ICD-10-CM

## 2016-08-10 DIAGNOSIS — F3113 Bipolar disorder, current episode manic without psychotic features, severe: Secondary | ICD-10-CM

## 2016-08-10 DIAGNOSIS — E1165 Type 2 diabetes mellitus with hyperglycemia: Secondary | ICD-10-CM

## 2016-08-10 DIAGNOSIS — R7881 Bacteremia: Secondary | ICD-10-CM

## 2016-08-10 LAB — LIPID PANEL
CHOL/HDL RATIO: 11.9 ratio
Cholesterol: 166 mg/dL (ref 0–200)
HDL: 14 mg/dL — AB (ref >40)
LDL CALC: 91 mg/dL (ref 0–99)
TRIGLYCERIDES: 305 mg/dL — AB (ref <150)
VLDL: 61 mg/dL — ABNORMAL HIGH (ref 0–40)

## 2016-08-10 LAB — GLUCOSE, CAPILLARY
GLUCOSE-CAPILLARY: 133 mg/dL — AB (ref 65–99)
Glucose-Capillary: 144 mg/dL — ABNORMAL HIGH (ref 65–99)
Glucose-Capillary: 122 mg/dL — ABNORMAL HIGH (ref 65–99)
Glucose-Capillary: 125 mg/dL — ABNORMAL HIGH (ref 65–99)
Glucose-Capillary: 141 mg/dL — ABNORMAL HIGH (ref 65–99)

## 2016-08-10 LAB — COMPREHENSIVE METABOLIC PANEL
ALT: 775 U/L — AB (ref 14–54)
ANION GAP: 9 (ref 5–15)
AST: 392 U/L — ABNORMAL HIGH (ref 15–41)
Albumin: 2.6 g/dL — ABNORMAL LOW (ref 3.5–5.0)
Alkaline Phosphatase: 254 U/L — ABNORMAL HIGH (ref 38–126)
BUN: 27 mg/dL — ABNORMAL HIGH (ref 6–20)
CHLORIDE: 106 mmol/L (ref 101–111)
CO2: 25 mmol/L (ref 22–32)
Calcium: 8.3 mg/dL — ABNORMAL LOW (ref 8.9–10.3)
Creatinine, Ser: 1.4 mg/dL — ABNORMAL HIGH (ref 0.44–1.00)
GFR calc non Af Amer: 33 mL/min — ABNORMAL LOW (ref >60)
GFR, EST AFRICAN AMERICAN: 39 mL/min — AB (ref >60)
Glucose, Bld: 140 mg/dL — ABNORMAL HIGH (ref 65–99)
POTASSIUM: 3.6 mmol/L (ref 3.5–5.1)
SODIUM: 140 mmol/L (ref 135–145)
Total Bilirubin: 3.9 mg/dL — ABNORMAL HIGH (ref 0.3–1.2)
Total Protein: 5.4 g/dL — ABNORMAL LOW (ref 6.5–8.1)

## 2016-08-10 LAB — CBC
HEMATOCRIT: 31.2 % — AB (ref 36.0–46.0)
HEMOGLOBIN: 10.2 g/dL — AB (ref 12.0–15.0)
MCH: 29.7 pg (ref 26.0–34.0)
MCHC: 32.7 g/dL (ref 30.0–36.0)
MCV: 90.7 fL (ref 78.0–100.0)
Platelets: 120 10*3/uL — ABNORMAL LOW (ref 150–400)
RBC: 3.44 MIL/uL — AB (ref 3.87–5.11)
RDW: 15.3 % (ref 11.5–15.5)
WBC: 8.2 10*3/uL (ref 4.0–10.5)

## 2016-08-10 LAB — HEPATITIS PANEL, ACUTE
HCV Ab: <0.1 s/co ratio (ref 0.0–0.9)
HEP A IGM: NEGATIVE
HEP B C IGM: NEGATIVE
Hepatitis B Surface Ag: NEGATIVE

## 2016-08-10 LAB — EPSTEIN-BARR VIRUS VCA, IGM

## 2016-08-10 LAB — CMV IGM: CMV IgM: <30 AU/mL (ref 0.0–29.9)

## 2016-08-10 LAB — URINE CULTURE

## 2016-08-10 LAB — HEPATITIS C ANTIBODY

## 2016-08-10 LAB — HEPATITIS B CORE ANTIBODY, IGM: Hep B C IgM: NEGATIVE

## 2016-08-10 LAB — HEPATITIS A ANTIBODY, IGM: HEP A IGM: NEGATIVE

## 2016-08-10 LAB — LACTIC ACID, PLASMA: Lactic Acid, Venous: 1 mmol/L (ref 0.5–1.9)

## 2016-08-10 LAB — TROPONIN I: Troponin I: 0.33 ng/mL (ref <0.03)

## 2016-08-10 MED ORDER — OXYCODONE-ACETAMINOPHEN 7.5-325 MG PO TABS
1.0000 | ORAL_TABLET | Freq: Four times a day (QID) | ORAL | Status: DC | PRN
  Administered 2016-08-10: 1 via ORAL
  Filled 2016-08-10: qty 1

## 2016-08-10 MED ORDER — POLYETHYLENE GLYCOL 3350 17 G PO PACK
17.0000 g | PACK | Freq: Every day | ORAL | Status: DC
  Administered 2016-08-10 – 2016-08-18 (×5): 17 g via ORAL
  Filled 2016-08-10 (×7): qty 1

## 2016-08-10 MED ORDER — SODIUM CHLORIDE 0.9 % IV SOLN
INTRAVENOUS | Status: DC
  Administered 2016-08-10 – 2016-08-12 (×4): via INTRAVENOUS
  Administered 2016-08-12: 75 mL/h via INTRAVENOUS
  Administered 2016-08-14: 18:00:00 via INTRAVENOUS

## 2016-08-10 NOTE — Progress Notes (Signed)
ANTIBIOTIC CONSULT NOTE -  Pharmacy Consult for Zosyn Indication: Sepsis, bacteremia  Allergies  Allergen Reactions  . Codeine Other (See Comments)    slurred speech    Patient Measurements: Height: 5\' 5"  (165.1 cm) Weight: 143 lb 8.3 oz (65.1 kg) IBW/kg (Calculated) : 57 Adjusted Body Weight:   Vital Signs: Temp: 98.9 F (37.2 C) (06/08 0813) Temp Source: Oral (06/08 0813) BP: 155/52 (06/08 0813) Pulse Rate: 73 (06/08 0813) Intake/Output from previous day: 06/07 0701 - 06/08 0700 In: 2705 [P.O.:240; I.V.:2415; IV Piggyback:50] Out: 4599 [Urine:1125] Intake/Output from this shift: No intake/output data recorded.  Labs:  Recent Labs  08/08/16 1845 08/09/16 0441 08/10/16 0429  WBC 9.5 15.3* 8.2  HGB 13.0 10.6* 10.2*  PLT 214 166 120*  CREATININE 1.24* 1.44* 1.40*   Estimated Creatinine Clearance: 26.4 mL/min (A) (by C-G formula based on SCr of 1.4 mg/dL (H)). No results for input(s): VANCOTROUGH, VANCOPEAK, VANCORANDOM, GENTTROUGH, GENTPEAK, GENTRANDOM, TOBRATROUGH, TOBRAPEAK, TOBRARND, AMIKACINPEAK, AMIKACINTROU, AMIKACIN in the last 72 hours.   Microbiology:   Medical History: Past Medical History:  Diagnosis Date  . Chest pain    a. 07/2014 Neg MV, nl EF.  Marland Kitchen Chronic interstitial cystitis   . Chronic low back pain   . CKD (chronic kidney disease), stage III   . Diabetes mellitus without complication ([place])   . GERD (gastroesophageal reflux disease)   . Hyperlipidemia   . Hypertension   . Macular degeneration   . Microhematuria   . Osteopenia   . Pernicious anemia   . Postlaminectomy syndrome   . PVC (premature ventricular contraction)   . Right carpal tunnel syndrome     Assessment:  Anticoag: LVX for DVT PX 30mg /d. Hgb down 10.2. Plts down to 120 (about 50% of baseline).  ID: Abx for sepsis from cholangitis, GNR bacteremia, WBC 15.3>8.2, Temp 99.4, LA 2.8 still elevated (calculated CrCl 30)  6/6 Vanc x1 dose 6/6 Zosyn >>   6/7 MRSA PCR:  Pos 6/6 BCx: GN rods x 2 6/6 UCx: multiple species 6/6 BCID: Enterobacter, Klebsiella   Goal of Therapy:  Eradication of infection  Plan:  Zosyn 3.375 g IV q8hr  Watch platelets on LMWH   Kenyan Karnes S. Alford Highland, PharmD, BCPS Clinical Staff Pharmacist Pager [phone]  Eilene Ghazi Stillinger 08/10/2016,8:42 AM

## 2016-08-10 NOTE — Progress Notes (Signed)
PROGRESS NOTE    [name]  FXT:[id number] DOB: [birth year] DOA: 08/08/2016 PCP: Lavone Orn, MD   Brief Narrative:  83 y.o.WF PMHx Bipolar disorder, HTN, DM, CKD stage III ( baseline Cr 1.1), CHF, and chronic pain (interstitial cystitis and chronic back pain)  Who presented with fever and epigastric pain.  In the ED she had a temp of 102.36F, AST/ALT 2610/1484, Total bilirubin 2.6, alkaline phosphatase >400, and normal Lipase.  CT angiogram of the chest showed a dilated CBD to 2 cm (similar to previous images), status post cholecystectomy.    Subjective: 6/8 A/O 4 , positive abdominal pain, negative CP,   Assessment & Plan:   Principal Problem:   Sepsis ([place]) Active Problems:   CKD (chronic kidney disease), stage III   Chronic diastolic congestive heart failure (HCC)   Essential hypertension, malignant   Diabetes mellitus with complication (HCC)   Bipolar disorder (HCC)   Recurrent cholangitis   Elevated troponin   Sepsis due to Klebsiella/Cholangitis -Resolving S/P stent placement CBD -Per GI Patient will need an EGD in 1 month, as an outpatient for removal of CBD stent which will be scheduled. -Patient still not taking PO diet/medication. Have encouraged patient to at least consume soups/juices. -Discontinue Dilaudid, start Percocet PO PRN pain -MiraLAX daily next line-normal saline 75 ml/hr  Klebsiella pneumoniae Bacteremia -Continue IV antibiotics. Will change over to PO once we are certain patient can tolerate PO diet/medication(awaiting susceptibility)   Elevated troponin -Most likely secondary to demand ischemia, trending down continue to monitor. However given patient's. 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 -Hemoglobin A1c pending -Lipid panel pending  Bipolar disorder -Lithium 150 mg daily -Remeron 30 mg daily  MRSA screen positive   DVT prophylaxis: Lovenox Code Status: NO CODE - DNR  Family Communication: no family present at time of exam  Disposition Plan:      Consultants:  [facility]    Procedures/Significant Events:  6/7 ERCP - diffusely dilated CBD (2cm) w/ no visible filling defect - sphincterotomy performed leading to drainage of pus - CBD stent placed  plastic CBD stent 5 cm 7 French    VENTILATOR SETTINGS:    Cultures 6/6 Hepatitis A/B/C negative 6/6 blood positive Enterobacteriaceae species/Klebsiella pneumoniae 6/6 urine positive multiple species 6/7 MRSA by PCR positive      Antimicrobials: Anti-infectives    Start     Stop   08/09/16 2000  vancomycin (VANCOCIN) 500 mg in sodium chloride 0.9 % 100 mL IVPB  Status:  Discontinued     08/09/16 0125   08/09/16 0400  piperacillin-tazobactam (ZOSYN) IVPB 3.375 g         08/08/16 1930  piperacillin-tazobactam (ZOSYN) IVPB 3.375 g     08/08/16 2104   08/08/16 1930  vancomycin (VANCOCIN) IVPB 1000 mg/200 mL premix     08/08/16 2116       Devices    LINES /  TUBES:  None    Continuous Infusions: . sodium chloride 75 mL/hr at 08/10/16 0523  . piperacillin-tazobactam (ZOSYN)  IV 3.375 g (08/10/16 0523)     Objective: Vitals:   08/10/16 0200 08/10/16 0349 08/10/16 0400 08/10/16 0813  BP: (!) 100/41 (!) 121/51 (!) 122/46 (!) 155/52  Pulse: 74 73 70 73  Resp: (!) 23 (!) 23 20 13   Temp:  98.4 F (36.9 C)  98.9 F (37.2 C)  TempSrc:  Oral  Oral    SpO2: 97% 96% 97% 100%  Weight:      Height:        Intake/Output Summary (Last 24 hours) at 08/10/16 0842 Last data filed at 08/10/16 0500  Gross per 24 hour  Intake             2405 ml  Output             1125 ml  Net             1280 ml   Filed Weights   08/08/16 1921 08/09/16 0500  Weight: 132 lb (59.9 kg) 143 lb 8.3 oz (65.1 kg)    Examination:  General: A/O 4, positive abdominal pain, No acute respiratory distress Eyes: negative scleral hemorrhage, negative anisocoria, negative icterus ENT: Negative Runny nose, negative gingival bleeding, Neck:  Negative scars, masses, torticollis, lymphadenopathy, JVD Lungs: Clear to auscultation bilaterally without wheezes or crackles Cardiovascular: Regular rate and rhythm without murmur gallop or rub normal S1 and S2 Abdomen: Positive abdominal pain RUQ/RLQ, nondistended, positive soft, bowel sounds, no rebound, no ascites, no appreciable mass Extremities: No significant cyanosis, clubbing, or edema bilateral lower extremities Skin: Negative rashes, lesions, ulcers Psychiatric:  Negative depression, negative anxiety, negative fatigue, negative mania  Central nervous system:  Cranial nerves II through XII intact, tongue/uvula midline, all extremities muscle strength 5/5, sensation intact throughout, negative dysarthria, negative expressive aphasia, negative receptive aphasia.  .     Data Reviewed: Care during the described time interval was provided by me .  I have reviewed this patient's available data, including medical history, events of note, physical examination, and all test results as part of my evaluation. I have personally reviewed and interpreted all radiology studies.  CBC:  Recent Labs Lab 08/08/16 1845 08/09/16 0441 08/10/16 0429  WBC 9.5 15.3* 8.2  NEUTROABS 7.9*  --   --   HGB 13.0 10.6* 10.2*  HCT 40.3 32.5* 31.2*  MCV 90.6 89.3 90.7  PLT 214 166 852*   Basic Metabolic Panel:  Recent Labs Lab  08/08/16 1845 08/09/16 0441 08/10/16 0429  NA 135 139 140  K 3.9 3.4* 3.6  CL 96* 102 106  CO2 24 24 25   GLUCOSE 271* 248* 140*  BUN 32* 29* 27*  CREATININE 1.24* 1.44* 1.40*  CALCIUM 9.6 8.8* 8.3*   GFR: Estimated Creatinine Clearance: 26.4 mL/min (A) (by C-G formula based on SCr of 1.4 mg/dL (H)). Liver Function Tests:  Recent Labs Lab 08/08/16 1845 08/09/16 0441 08/10/16 0429  AST 2,610* 1,391* 392*  ALT 1,484* 1,270* 775*  ALKPHOS 423* 331* 254*  BILITOT 2.6* 3.7* 3.9*  PROT 7.4 5.5* 5.4*  ALBUMIN 3.8 2.9* 2.6*    Recent Labs Lab 08/08/16 1845  LIPASE 42   No results for input(s): AMMONIA in the last 168 hours. Coagulation Profile:  Recent Labs Lab 08/08/16 2323  INR 1.13   Cardiac Enzymes:  Recent Labs Lab 08/08/16 2323 08/09/16 0441 08/09/16 1522 08/10/16 0429  CKTOTAL 21*  --   --   --  TROPONINI 0.41* 1.03* 0.49* 0.33*   BNP (last 3 results) No results for input(s): PROBNP in the last 8760 hours. HbA1C: No results for input(s): HGBA1C in the last 72 hours. CBG:  Recent Labs Lab 08/09/16 2012 08/09/16 2042 08/09/16 2341 08/10/16 0348 08/10/16 0757  GLUCAP 55* 97 208* 144* 133*   Lipid Profile: No results for input(s): CHOL, HDL, LDLCALC, TRIG, CHOLHDL, LDLDIRECT in the last 72 hours. Thyroid Function Tests: No results for input(s): TSH, T4TOTAL, FREET4, T3FREE, THYROIDAB in the last 72 hours. Anemia Panel: No results for input(s): VITAMINB12, FOLATE, FERRITIN, TIBC, IRON, RETICCTPCT in the last 72 hours. Urine analysis:    Component Value Date/Time   COLORURINE YELLOW 08/08/2016 2004   APPEARANCEUR HAZY (A) 08/08/2016 2004   LABSPEC 1.014 08/08/2016 2004   PHURINE 5.0 08/08/2016 2004   GLUCOSEU 150 (A) 08/08/2016 2004   HGBUR NEGATIVE 08/08/2016 2004   BILIRUBINUR NEGATIVE 08/08/2016 2004   [place] NEGATIVE 08/08/2016 2004   PROTEINUR 30 (A) 08/08/2016 2004   UROBILINOGEN 0.2 11/20/2014 1405   NITRITE NEGATIVE  08/08/2016 2004   LEUKOCYTESUR NEGATIVE 08/08/2016 2004   Sepsis Labs: @LABRCNTIP (procalcitonin:4,lacticidven:4)  ) Recent Results (from the past 240 hour(s))  Blood Culture (routine x 2)     Status: None (Preliminary result)   Collection Time: 08/08/16  7:20 PM  Result Value Ref Range Status   Specimen Description BLOOD RIGHT WRIST  Final   Special Requests   Final    BOTTLES DRAWN AEROBIC AND ANAEROBIC Blood Culture adequate volume   Culture  Setup Time   Final    GRAM NEGATIVE RODS IN BOTH AEROBIC AND ANAEROBIC BOTTLES CRITICAL VALUE NOTED.  VALUE IS CONSISTENT WITH PREVIOUSLY REPORTED AND CALLED VALUE.    Culture GRAM NEGATIVE RODS  Final   Report Status PENDING  Incomplete  Blood Culture (routine x 2)     Status: None (Preliminary result)   Collection Time: 08/08/16  8:01 PM  Result Value Ref Range Status   Specimen Description BLOOD LEFT ANTECUBITAL  Final   Special Requests IN PEDIATRIC BOTTLE Blood Culture adequate volume  Final   Culture  Setup Time   Final    GRAM NEGATIVE RODS IN PEDIATRIC BOTTLE Organism ID to follow CRITICAL RESULT CALLED TO, READ BACK BY AND VERIFIED WITH: Hughie Closs Pharm.D. 10:15 08/09/16 ([email or web address])    Culture GRAM NEGATIVE RODS  Final   Report Status PENDING  Incomplete  Blood Culture ID Panel (Reflexed)     Status: Abnormal   Collection Time: 08/08/16  8:01 PM  Result Value Ref Range Status   Enterococcus species NOT DETECTED NOT DETECTED Final   Listeria monocytogenes NOT DETECTED NOT DETECTED Final   Staphylococcus species NOT DETECTED NOT DETECTED Final   Staphylococcus aureus NOT DETECTED NOT DETECTED Final   Streptococcus species NOT DETECTED NOT DETECTED Final   Streptococcus agalactiae NOT DETECTED NOT DETECTED Final   Streptococcus pneumoniae NOT DETECTED NOT DETECTED Final   Streptococcus pyogenes NOT DETECTED NOT DETECTED Final   Acinetobacter baumannii NOT DETECTED NOT DETECTED Final   Enterobacteriaceae species DETECTED (A) NOT  DETECTED Final    Comment: Enterobacteriaceae represent a large family of gram-negative bacteria, not a single organism. CRITICAL RESULT CALLED TO, READ BACK BY AND VERIFIED WITH: Hughie Closs Pharm.D. 10:15 08/09/16 ([email or web address])    Enterobacter cloacae complex NOT DETECTED NOT DETECTED Final   Escherichia coli NOT DETECTED NOT DETECTED Final   Klebsiella oxytoca NOT DETECTED NOT DETECTED Final   Klebsiella pneumoniae DETECTED (A) NOT  DETECTED Final    Comment: CRITICAL RESULT CALLED TO, READ BACK BY AND VERIFIED WITH: M. Prentiss Bells Pharm.D. 10:15 08/09/16 ([email or web address])    Proteus species NOT DETECTED NOT DETECTED Final   Serratia marcescens NOT DETECTED NOT DETECTED Final   Carbapenem resistance NOT DETECTED NOT DETECTED Final   Haemophilus influenzae NOT DETECTED NOT DETECTED Final   Neisseria meningitidis NOT DETECTED NOT DETECTED Final   Pseudomonas aeruginosa NOT DETECTED NOT DETECTED Final   Candida albicans NOT DETECTED NOT DETECTED Final   Candida glabrata NOT DETECTED NOT DETECTED Final   Candida krusei NOT DETECTED NOT DETECTED Final   Candida parapsilosis NOT DETECTED NOT DETECTED Final   Candida tropicalis NOT DETECTED NOT DETECTED Final  Urine culture     Status: Abnormal   Collection Time: 08/08/16  8:04 PM  Result Value Ref Range Status   Specimen Description URINE, RANDOM  Final   Special Requests NONE  Final   Culture MULTIPLE SPECIES PRESENT, SUGGEST RECOLLECTION (A)  Final   Report Status 08/10/2016 FINAL  Final  MRSA PCR Screening     Status: Abnormal   Collection Time: 08/09/16  4:07 AM  Result Value Ref Range Status   MRSA by PCR POSITIVE (A) NEGATIVE Final    Comment:        The GeneXpert MRSA Assay (FDA approved for NASAL specimens only), is one component of a comprehensive MRSA colonization surveillance program. It is not intended to diagnose MRSA infection nor to guide or monitor treatment for MRSA infections. RESULT CALLED TO, READ BACK BY AND VERIFIED  WITH: [facility] RN AT [phone] 08/09/16 BY A.DAVIS          Radiology Studies: Ct Abdomen Pelvis Wo Contrast  Result Date: 08/09/2016 CLINICAL DATA:  Dyspnea and nausea starting today. Elevated lactic acid levels. EXAM: CT ABDOMEN AND PELVIS WITHOUT CONTRAST TECHNIQUE: Multidetector CT imaging of the abdomen and pelvis was performed following the standard protocol without IV contrast. COMPARISON:  01/03/2016 CT and chest CT performed earlier on the same day FINDINGS: Lower chest: Borderline cardiomegaly with bibasilar atelectasis and/or scarring. No pericardial effusion. There is coronary arteriosclerosis. Hepatobiliary: Status post cholecystectomy with chronic stable dilatation of the intra and extrahepatic biliary ducts without calculus noted. The common bile duct is slightly more dilated on current exam up to 2.1 cm at the pancreatic head versus 1.7 cm in 2017. No mural thickening of the bowel ducts is identified. Lack of IV contrast limits assessment for mucosal enhancement. Patient reportedly received IV contrast earlier for a chest CT. On that exam, no definite mucosal enhancement is noted. Pancreas: Stable 2.7 cm cystic lesion in the proximal body of the pancreas. No ductal dilatation is noted. Spleen: Normal in size without focal abnormality. Adrenals/Urinary Tract: Normal bilateral adrenal glands. Small left renal cysts are again visualized. Symmetric pyelograms noted bilaterally status post prior CT administration for the chest. No obstructive uropathy. Contrast opacified urine distended bladder. Stomach/Bowel: All the stomach and small intestine are normal. There is a moderate amount of fecal retention throughout large bowel with circular muscle hypertrophy and extensive sigmoid diverticulosis noted similar in appearance to prior. The appendix is not visualized but no pericecal inflammation is noted. Vascular/Lymphatic: Diffuse atherosclerosis of the abdominal aorta and branch vessels without  aneurysm. No retroperitoneal or pelvic sidewall adenopathy. No inguinal lymphadenopathy. Reproductive: Uterus and bilateral adnexa are unremarkable. Other: No abdominal wall hernia or abnormality. No abdominopelvic ascites. Musculoskeletal: Thoracolumbar spondylosis with multilevel degenerative disc disease along the lumbar spine. Osteopenia is noted.  No acute fracture nor bone destruction. IMPRESSION: 1. Coronary arteriosclerosis and aortic atherosclerosis. 2. Extensive sigmoid diverticulosis without acute diverticulitis. 3. Status post cholecystectomy with chronic intrahepatic and extrahepatic ductal dilatation without mural thickening or enhancement seen on recent same day CT with IV contrast. No conclusive findings of cholangitis. 4. Stable 2.7 cm pancreatic head cyst and small left-sided renal cysts. 5. Spondylosis of the lumbar spine. Electronically Signed   By: Ashley Royalty M.D.   On: 08/09/2016 00:04   Ct Angio Chest Pe W Or Wo Contrast  Result Date: 08/08/2016 CLINICAL DATA:  83 year old with acute onset of chest pain, shortness of breath and nausea and vomiting that began earlier today. EXAM: CT ANGIOGRAPHY CHEST WITH CONTRAST TECHNIQUE: Multidetector CT imaging of the chest was performed using the standard protocol during bolus administration of intravenous contrast. Multiplanar CT image reconstructions and MIPs were obtained to evaluate the vascular anatomy. CONTRAST:  100 mL Isovue 370 IV. COMPARISON:  Unenhanced CT chest 01/13/2015. FINDINGS: Beam hardening streak artifact is present as the patient was unable to raise the arms over the head. Respiratory motion blurred many of the images. Cardiovascular: Contrast opacification of the pulmonary arteries is very good. No filling defects within either main pulmonary artery or their segmental branches in either lung to suggest pulmonary embolism. Heart moderately enlarged with left ventricular predominance. Severe LAD and right coronary artery  atherosclerosis. No pericardial effusion. Severe atherosclerosis involving the thoracic and upper abdominal aorta without evidence of aneurysm or dissection. Gas is present within the right subclavian vein and the lower right internal jugular vein, possibly related to an intravenous injection in the right upper extremity. Mediastinum/Nodes: Scattered normal sized mediastinal lymph nodes. No pathologically enlarged mediastinal, hilar or axillary lymph nodes. No mediastinal masses. Normal-appearing esophagus. Visualized thyroid gland unremarkable. Lungs/Pleura: Low lung volumes. Scattered areas of localized hyperlucency in both lungs indicating focal air trapping. Scarring in the lingula and in the lower lobes. No confluent airspace consolidation. No significant pulmonary parenchymal nodules or masses, as there is a stable subpleural nodule adjacent to the right major fissure (series 7, image 45), indicating a subpleural lymph node. No pleural effusions. Subpleural fibrosis is present bilaterally, most prominent in the lingula. Central airways patent. Upper Abdomen: Small hiatal hernia. Prior cholecystectomy which accounts for the dilated proximal common bile duct. Scarring and a simple cyst is present in the upper pole of the visualized left kidney. Visualized upper abdomen otherwise unremarkable for the early phase of enhancement. Musculoskeletal: Lower cervical degenerative disc disease and spondylosis. Osseous demineralization. No acute abnormalities. Review of the MIP images confirms the above findings. IMPRESSION: 1. No evidence of pulmonary embolism. 2. Moderate cardiomegaly. LAD and right coronary artery atherosclerosis. 3. Severe atherosclerosis involving the thoracic and upper abdominal aorta without aneurysm. 4. Low lung volumes. Scattered areas of localized air trapping in both lungs as can be seen in patients with asthma or bronchitis. Chronic lung changes as detailed above No acute cardiopulmonary  disease otherwise. 5. Small hiatal hernia. Electronically Signed   By: Evangeline Dakin M.D.   On: 08/08/2016 20:47   Dg Chest Portable 1 View  Result Date: 08/08/2016 CLINICAL DATA:  Dyspnea and EXAM: PORTABLE CHEST 1 VIEW COMPARISON:  03/14/2016 FINDINGS: The heart size and mediastinal contours are within normal limits. Aortic atherosclerosis is noted. No pneumonic consolidation, effusion or pneumothorax. Chronic subpleural interstitial prominence may reflect areas of fibrosis, scarring and/or chronic interstitial lung disease. Osteoarthritis of both glenohumeral joints with spurring and joint space narrowing. Slight remodeling  along the undersurface of the distal acromion and clavicle on the left. This can be seen with chronic rotator cuff tear or rheumatoid arthritis. IMPRESSION: 1. Aortic atherosclerosis. 2. Mild stable interstitial prominence which may reflect subpleural fibrosis, scarring or sequela of chronic interstitial disease. 3. No acute pneumonic consolidation or CHF. Electronically Signed   By: Ashley Royalty M.D.   On: 08/08/2016 19:52   Dg Ercp  Result Date: 08/09/2016 CLINICAL DATA:  ERCP EXAM: ERCP TECHNIQUE: Multiple spot images obtained with the fluoroscopic device and submitted for interpretation post-procedure. COMPARISON:  CT abdomen pelvis - 08/08/2016 FINDINGS: 3 spot intraoperative fluoroscopic images of the right upper abdominal quadrant during ERCP are provided for review Initial image demonstrates an ERCP probe overlying the right upper abdominal quadrant. Post cholecystectomy. There is selective cannulation opacification of the common bile duct which appears markedly dilated compatible with the findings seen on preceding abdominal CT. There is no definitive opacification of the pancreatic or residual cystic ducts. There is no significant opacification of the intrahepatic biliary system. No definitive filling defects are seen with the opacified portions of the common bile duct to  suggest the presence of choledocholithiasis. Completion image demonstrates placement of a internal biliary stent overlying the distal aspect of the common bile duct. IMPRESSION: ERCP with biliary stent placement as above. These images were submitted for radiologic interpretation only. Please see the procedural report for the amount of contrast and the fluoroscopy time utilized. Electronically Signed   By: Sandi Mariscal M.D.   On: 08/09/2016 14:36   US Abdomen Limited Ruq  Result Date: 08/10/2016 CLINICAL DATA:  Cholangitis. ERCP earlier this day with stent placement. EXAM: ULTRASOUND ABDOMEN LIMITED RIGHT UPPER QUADRANT COMPARISON:  Abdominal CT yesterday. FINDINGS: Gallbladder: Surgically absent. Common bile duct: Diameter: 17 mm at the porta hepatis. 2.2 x 2.5 cm distally. Biliary stent is visualized. Liver: No focal lesion identified. Linear echogenic shadowing is likely pneumobilia related to recent ERCP. Normal directional flow in the main portal vein. 2.6 cm cyst in the pancreatic head, as seen on CT. IMPRESSION: 1. Biliary dilatation with biliary stent visualized in the common bile duct. The degree of biliary dilatation appears similar to CT yesterday. 2. Linear echogenic shadowing structures in the liver likely pneumobilia. Electronically Signed   By: Jeb Levering M.D.   On: 08/10/2016 02:27        Scheduled Meds: . aspirin EC  81 mg Oral Daily  . atorvastatin  5 mg Oral Daily  . Chlorhexidine Gluconate Cloth  6 each Topical Q0600  . enoxaparin (LOVENOX) injection  30 mg Subcutaneous Q24H  . FLUoxetine  20 mg Oral Daily  . insulin aspart  0-9 Units Subcutaneous Q4H  . lithium carbonate  150 mg Oral Daily  . metoprolol tartrate  12.5 mg Oral BID  . mirtazapine  30 mg Oral QHS  . mupirocin ointment  1 application Nasal BID  . oxybutynin  5 mg Oral BID  . Vilazodone HCl  20 mg Oral Daily  . zolpidem  2.5 mg Oral QHS   Continuous Infusions: . sodium chloride 75 mL/hr at 08/10/16 0523   . piperacillin-tazobactam (ZOSYN)  IV 3.375 g (08/10/16 0523)     LOS: 1 day    Time spent: 40 minutes    Rosette Bellavance, Geraldo Docker, MD [facility] Pager [phone]   If 7PM-7AM, please contact night-coverage [email or web address] Password [employer] [facility] 08/10/2016, 8:42 AM

## 2016-08-10 NOTE — Progress Notes (Signed)
Subjective: The patient was seen and examined earlier today morning.  She has remained afebrile, hypotension has resolved, and she is not tachycardic anymore. Patient states the pain in epigastrium and right upper quadrant has resolved, however, complains of discomfort in the right lower quadrant. She has a decreased appetite.  Objective: Vital signs in last 24 hours: Temp:  [97.5 F (36.4 C)-99.4 F (37.4 C)] 98.9 F (37.2 C) (06/08 0813) Pulse Rate:  [65-88] 73 (06/08 0813) Resp:  [13-26] 13 (06/08 0813) BP: (100-155)/(33-65) 155/52 (06/08 0813) SpO2:  [95 %-100 %] 100 % (06/08 0813) Weight change:     PE: Not in acute distress GENERAL: Mild icterus, hives pallor ABDOMEN: Soft, distended, voluntary guarding, mild tenderness and right lower quadrant, sluggish bowel sounds EXTREMITIES: No edema, soreness in bilateral lower legs(not new)  Lab Results: Results for orders placed or performed during the hospital encounter of 08/08/16 (from the past 48 hour(s))  Basic metabolic panel     Status: Abnormal   Collection Time: 08/08/16  6:45 PM  Result Value Ref Range   Sodium 135 135 - 145 mmol/L   Potassium 3.9 3.5 - 5.1 mmol/L   Chloride 96 (L) 101 - 111 mmol/L   CO2 24 22 - 32 mmol/L   Glucose, Bld 271 (H) 65 - 99 mg/dL   BUN 32 (H) 6 - 20 mg/dL   Creatinine, Ser 1.24 (H) 0.44 - 1.00 mg/dL   Calcium 9.6 8.9 - 10.3 mg/dL   GFR calc non Af Amer 38 (L) >60 mL/min   GFR calc Af Amer 45 (L) >60 mL/min    Comment: (NOTE) The eGFR has been calculated using the CKD EPI equation. This calculation has not been validated in all clinical situations. eGFR's persistently <60 mL/min signify possible Chronic Kidney Disease.    Anion gap 15 5 - 15  CBC     Status: None   Collection Time: 08/08/16  6:45 PM  Result Value Ref Range   WBC 9.5 4.0 - 10.5 K/uL   RBC 4.45 3.87 - 5.11 MIL/uL   Hemoglobin 13.0 12.0 - 15.0 g/dL   HCT 40.3 36.0 - 46.0 %   MCV 90.6 78.0 - 100.0 fL   MCH 29.2 26.0  - 34.0 pg   MCHC 32.3 30.0 - 36.0 g/dL   RDW 14.1 11.5 - 15.5 %   Platelets 214 150 - 400 K/uL  Brain natriuretic peptide     Status: None   Collection Time: 08/08/16  6:45 PM  Result Value Ref Range   B Natriuretic Peptide 35.2 0.0 - 100.0 pg/mL  Hepatic function panel     Status: Abnormal   Collection Time: 08/08/16  6:45 PM  Result Value Ref Range   Total Protein 7.4 6.5 - 8.1 g/dL   Albumin 3.8 3.5 - 5.0 g/dL   AST 2,610 (H) 15 - 41 U/L    Comment: RESULTS CONFIRMED BY MANUAL DILUTION   ALT 1,484 (H) 14 - 54 U/L   Alkaline Phosphatase 423 (H) 38 - 126 U/L   Total Bilirubin 2.6 (H) 0.3 - 1.2 mg/dL   Bilirubin, Direct 1.6 (H) 0.1 - 0.5 mg/dL   Indirect Bilirubin 1.0 (H) 0.3 - 0.9 mg/dL  Lipase, blood     Status: None   Collection Time: 08/08/16  6:45 PM  Result Value Ref Range   Lipase 42 11 - 51 U/L  Differential     Status: Abnormal   Collection Time: 08/08/16  6:45 PM  Result Value Ref  Range   Neutrophils Relative % 85 %   Neutro Abs 7.9 (H) 1.7 - 7.7 K/uL   Lymphocytes Relative 8 %   Lymphs Abs 0.8 0.7 - 4.0 K/uL   Monocytes Relative 7 %   Monocytes Absolute 0.6 0.1 - 1.0 K/uL   Eosinophils Relative 0 %   Eosinophils Absolute 0.0 0.0 - 0.7 K/uL   Basophils Relative 0 %   Basophils Absolute 0.0 0.0 - 0.1 K/uL  I-stat troponin, ED     Status: None   Collection Time: 08/08/16  6:56 PM  Result Value Ref Range   Troponin i, poc 0.00 0.00 - 0.08 ng/mL   Comment 3            Comment: Due to the release kinetics of cTnI, a negative result within the first hours of the onset of symptoms does not rule out myocardial infarction with certainty. If myocardial infarction is still suspected, repeat the test at appropriate intervals.   I-Stat CG4 Lactic Acid, ED     Status: Abnormal   Collection Time: 08/08/16  6:58 PM  Result Value Ref Range   Lactic Acid, Venous 4.08 (HH) 0.5 - 1.9 mmol/L   Comment NOTIFIED PHYSICIAN   Blood Culture (routine x 2)     Status: None  (Preliminary result)   Collection Time: 08/08/16  7:20 PM  Result Value Ref Range   Specimen Description BLOOD RIGHT WRIST    Special Requests      BOTTLES DRAWN AEROBIC AND ANAEROBIC Blood Culture adequate volume   Culture  Setup Time      GRAM NEGATIVE RODS IN BOTH AEROBIC AND ANAEROBIC BOTTLES CRITICAL VALUE NOTED.  VALUE IS CONSISTENT WITH PREVIOUSLY REPORTED AND CALLED VALUE.    Culture      GRAM NEGATIVE RODS IDENTIFICATION AND SUSCEPTIBILITIES TO FOLLOW    Report Status PENDING   Blood Culture (routine x 2)     Status: None (Preliminary result)   Collection Time: 08/08/16  8:01 PM  Result Value Ref Range   Specimen Description BLOOD LEFT ANTECUBITAL    Special Requests IN PEDIATRIC BOTTLE Blood Culture adequate volume    Culture  Setup Time      GRAM NEGATIVE RODS IN PEDIATRIC BOTTLE Organism ID to follow CRITICAL RESULT CALLED TO, READ BACK BY AND VERIFIED WITH: Hughie Closs Pharm.D. 10:15 08/09/16 ([email or web address])    Culture      GRAM NEGATIVE RODS IDENTIFICATION AND SUSCEPTIBILITIES TO FOLLOW    Report Status PENDING   Blood Culture ID Panel (Reflexed)     Status: Abnormal   Collection Time: 08/08/16  8:01 PM  Result Value Ref Range   Enterococcus species NOT DETECTED NOT DETECTED   Listeria monocytogenes NOT DETECTED NOT DETECTED   Staphylococcus species NOT DETECTED NOT DETECTED   Staphylococcus aureus NOT DETECTED NOT DETECTED   Streptococcus species NOT DETECTED NOT DETECTED   Streptococcus agalactiae NOT DETECTED NOT DETECTED   Streptococcus pneumoniae NOT DETECTED NOT DETECTED   Streptococcus pyogenes NOT DETECTED NOT DETECTED   Acinetobacter baumannii NOT DETECTED NOT DETECTED   Enterobacteriaceae species DETECTED (A) NOT DETECTED    Comment: Enterobacteriaceae represent a large family of gram-negative bacteria, not a single organism. CRITICAL RESULT CALLED TO, READ BACK BY AND VERIFIED WITH: Hughie Closs Pharm.D. 10:15 08/09/16 ([email or web address])    Enterobacter cloacae  complex NOT DETECTED NOT DETECTED   Escherichia coli NOT DETECTED NOT DETECTED   Klebsiella oxytoca NOT DETECTED NOT DETECTED   Klebsiella pneumoniae DETECTED (A) NOT DETECTED  Comment: CRITICAL RESULT CALLED TO, READ BACK BY AND VERIFIED WITH: M. Prentiss Bells Pharm.D. 10:15 08/09/16 ([email or web address])    Proteus species NOT DETECTED NOT DETECTED   Serratia marcescens NOT DETECTED NOT DETECTED   Carbapenem resistance NOT DETECTED NOT DETECTED   Haemophilus influenzae NOT DETECTED NOT DETECTED   Neisseria meningitidis NOT DETECTED NOT DETECTED   Pseudomonas aeruginosa NOT DETECTED NOT DETECTED   Candida albicans NOT DETECTED NOT DETECTED   Candida glabrata NOT DETECTED NOT DETECTED   Candida krusei NOT DETECTED NOT DETECTED   Candida parapsilosis NOT DETECTED NOT DETECTED   Candida tropicalis NOT DETECTED NOT DETECTED  Urinalysis, Routine w reflex microscopic     Status: Abnormal   Collection Time: 08/08/16  8:04 PM  Result Value Ref Range   Color, Urine YELLOW YELLOW   APPearance HAZY (A) CLEAR   Specific Gravity, Urine 1.014 1.005 - 1.030   pH 5.0 5.0 - 8.0   Glucose, UA 150 (A) NEGATIVE mg/dL   Hgb urine dipstick NEGATIVE NEGATIVE   Bilirubin Urine NEGATIVE NEGATIVE   Ketones, ur NEGATIVE NEGATIVE mg/dL   Protein, ur 30 (A) NEGATIVE mg/dL   Nitrite NEGATIVE NEGATIVE   Leukocytes, UA NEGATIVE NEGATIVE   RBC / HPF 0-5 0 - 5 RBC/hpf   WBC, UA 0-5 0 - 5 WBC/hpf   Bacteria, UA NONE SEEN NONE SEEN   Squamous Epithelial / LPF 0-5 (A) NONE SEEN   Mucous PRESENT   Urine culture     Status: Abnormal   Collection Time: 08/08/16  8:04 PM  Result Value Ref Range   Specimen Description URINE, RANDOM    Special Requests NONE    Culture MULTIPLE SPECIES PRESENT, SUGGEST RECOLLECTION (A)    Report Status 08/10/2016 FINAL   Acetaminophen level     Status: Abnormal   Collection Time: 08/08/16 10:13 PM  Result Value Ref Range   Acetaminophen (Tylenol), Serum <10 (L) 10 - 30 ug/mL    Comment:         THERAPEUTIC CONCENTRATIONS VARY SIGNIFICANTLY. A RANGE OF 10-30 ug/mL MAY BE AN EFFECTIVE CONCENTRATION FOR MANY PATIENTS. HOWEVER, SOME ARE BEST TREATED AT CONCENTRATIONS OUTSIDE THIS RANGE. ACETAMINOPHEN CONCENTRATIONS >150 ug/mL AT 4 HOURS AFTER INGESTION AND >50 ug/mL AT 12 HOURS AFTER INGESTION ARE OFTEN ASSOCIATED WITH TOXIC REACTIONS.   Hepatitis panel, acute     Status: None   Collection Time: 08/08/16 10:13 PM  Result Value Ref Range   Hepatitis B Surface Ag Negative Negative   HCV Ab <0.1 0.0 - 0.9 s/co ratio    Comment: (NOTE)                                  Negative:     < 0.8                             Indeterminate: 0.8 - 0.9                                  Positive:     > 0.9 The CDC recommends that a positive HCV antibody result be followed up with a HCV Nucleic Acid Amplification test (836629). Performed At: [facility] [place], [place] [id number] Lindon Romp MD [id number]    Hep A IgM Negative Negative   Hep B  C IgM Negative Negative  I-Stat Troponin, ED (not at [facility])     Status: Abnormal   Collection Time: 08/08/16 10:23 PM  Result Value Ref Range   Troponin i, poc 0.29 (HH) 0.00 - 0.08 ng/mL   Comment NOTIFIED PHYSICIAN    Comment 3            Comment: Due to the release kinetics of cTnI, a negative result within the first hours of the onset of symptoms does not rule out myocardial infarction with certainty. If myocardial infarction is still suspected, repeat the test at appropriate intervals.   I-Stat CG4 Lactic Acid, ED     Status: Abnormal   Collection Time: 08/08/16 10:25 PM  Result Value Ref Range   Lactic Acid, Venous 4.80 (HH) 0.5 - 1.9 mmol/L   Comment NOTIFIED PHYSICIAN   CK     Status: Abnormal   Collection Time: 08/08/16 11:23 PM  Result Value Ref Range   Total CK 21 (L) 38 - 234 U/L  Protime-INR     Status: None   Collection Time: 08/08/16 11:23 PM  Result Value Ref Range   Prothrombin Time  14.5 11.4 - 15.2 seconds   INR 1.13   Troponin I     Status: Abnormal   Collection Time: 08/08/16 11:23 PM  Result Value Ref Range   Troponin I 0.41 (HH) <0.03 ng/mL    Comment: CRITICAL RESULT CALLED TO, READ BACK BY AND VERIFIED WITH: C.BAIN,RN 0025 08/09/16 M.CAMPBELL   Lithium level     Status: Abnormal   Collection Time: 08/09/16  3:05 AM  Result Value Ref Range   Lithium Lvl 0.30 (L) 0.60 - 1.20 mmol/L  MRSA PCR Screening     Status: Abnormal   Collection Time: 08/09/16  4:07 AM  Result Value Ref Range   MRSA by PCR POSITIVE (A) NEGATIVE    Comment:        The GeneXpert MRSA Assay (FDA approved for NASAL specimens only), is one component of a comprehensive MRSA colonization surveillance program. It is not intended to diagnose MRSA infection nor to guide or monitor treatment for MRSA infections. RESULT CALLED TO, READ BACK BY AND VERIFIED WITH: D.CORREA RN AT [id number] 08/09/16 BY A.DAVIS   Glucose, capillary     Status: Abnormal   Collection Time: 08/09/16  4:11 AM  Result Value Ref Range   Glucose-Capillary 238 (H) 65 - 99 mg/dL   Comment 1 Notify RN   Lactic acid, plasma     Status: None   Collection Time: 08/09/16  4:41 AM  Result Value Ref Range   Lactic Acid, Venous 1.6 0.5 - 1.9 mmol/L  Troponin I     Status: Abnormal   Collection Time: 08/09/16  4:41 AM  Result Value Ref Range   Troponin I 1.03 (HH) <0.03 ng/mL    Comment: CRITICAL RESULT CALLED TO, READ BACK BY AND VERIFIED WITH: [name] [id number] WILDERK   Comprehensive metabolic panel     Status: Abnormal   Collection Time: 08/09/16  4:41 AM  Result Value Ref Range   Sodium 139 135 - 145 mmol/L   Potassium 3.4 (L) 3.5 - 5.1 mmol/L   Chloride 102 101 - 111 mmol/L   CO2 24 22 - 32 mmol/L   Glucose, Bld 248 (H) 65 - 99 mg/dL   BUN 29 (H) 6 - 20 mg/dL   Creatinine, Ser 1.44 (H) 0.44 - 1.00 mg/dL   Calcium 8.8 (L) 8.9 - 10.3 mg/dL  Total Protein 5.5 (L) 6.5 - 8.1 g/dL   Albumin 2.9 (L) 3.5 - 5.0  g/dL   AST 1,391 (H) 15 - 41 U/L   ALT 1,270 (H) 14 - 54 U/L   Alkaline Phosphatase 331 (H) 38 - 126 U/L   Total Bilirubin 3.7 (H) 0.3 - 1.2 mg/dL   GFR calc non Af Amer 32 (L) >60 mL/min   GFR calc Af Amer 37 (L) >60 mL/min    Comment: (NOTE) The eGFR has been calculated using the CKD EPI equation. This calculation has not been validated in all clinical situations. eGFR's persistently <60 mL/min signify possible Chronic Kidney Disease.    Anion gap 13 5 - 15  CBC     Status: Abnormal   Collection Time: 08/09/16  4:41 AM  Result Value Ref Range   WBC 15.3 (H) 4.0 - 10.5 K/uL   RBC 3.64 (L) 3.87 - 5.11 MIL/uL   Hemoglobin 10.6 (L) 12.0 - 15.0 g/dL   HCT 32.5 (L) 36.0 - 46.0 %   MCV 89.3 78.0 - 100.0 fL   MCH 29.1 26.0 - 34.0 pg   MCHC 32.6 30.0 - 36.0 g/dL   RDW 14.4 11.5 - 15.5 %   Platelets 166 150 - 400 K/uL  Lactic acid, plasma     Status: Abnormal   Collection Time: 08/09/16  6:47 AM  Result Value Ref Range   Lactic Acid, Venous 2.8 (HH) 0.5 - 1.9 mmol/L    Comment: CRITICAL RESULT CALLED TO, READ BACK BY AND VERIFIED WITH: E SOLOMON,RN [id number] WILDERK   Glucose, capillary     Status: Abnormal   Collection Time: 08/09/16  8:28 AM  Result Value Ref Range   Glucose-Capillary 236 (H) 65 - 99 mg/dL  Glucose, capillary     Status: Abnormal   Collection Time: 08/09/16 11:26 AM  Result Value Ref Range   Glucose-Capillary 181 (H) 65 - 99 mg/dL  Glucose, capillary     Status: Abnormal   Collection Time: 08/09/16  3:10 PM  Result Value Ref Range   Glucose-Capillary 134 (H) 65 - 99 mg/dL  Troponin I     Status: Abnormal   Collection Time: 08/09/16  3:22 PM  Result Value Ref Range   Troponin I 0.49 (HH) <0.03 ng/mL    Comment: CRITICAL VALUE NOTED.  VALUE IS CONSISTENT WITH PREVIOUSLY REPORTED AND CALLED VALUE.  Hepatitis B core antibody, IgM     Status: None   Collection Time: 08/09/16  3:22 PM  Result Value Ref Range   Hep B C IgM Negative Negative    Comment:  (NOTE) Performed At: [facility] [place], [place] [id number] Lindon Romp MD [id number]   Hepatitis C antibody     Status: None   Collection Time: 08/09/16  3:22 PM  Result Value Ref Range   HCV Ab <0.1 0.0 - 0.9 s/co ratio    Comment: (NOTE)                                  Negative:     < 0.8                             Indeterminate: 0.8 - 0.9  Positive:     > 0.9 The CDC recommends that a positive HCV antibody result be followed up with a HCV Nucleic Acid Amplification test (035465). Performed At: [facility] [address] [place], [place] [id number] Lindon Romp MD [id number]   Hepatitis A antibody, IgM     Status: None   Collection Time: 08/09/16  3:22 PM  Result Value Ref Range   Hep A IgM Negative Negative    Comment: (NOTE) Performed At: [facility] [place], [place] [id number] Lindon Romp MD [id number]   CMV IgM     Status: None   Collection Time: 08/09/16  3:22 PM  Result Value Ref Range   CMV IgM <30.0 0.0 - 29.9 AU/mL    Comment: (NOTE)                                Negative         <30.0                                Equivocal  30.0 - 34.9                                Positive         >34.9 A positive result is generally indicative of acute infection, reactivation or persistent IgM production. Performed At: [facility] [place], [place] [id number] Lindon Romp MD [id number]   Glucose, capillary     Status: Abnormal   Collection Time: 08/09/16  7:45 PM  Result Value Ref Range   Glucose-Capillary 54 (L) 65 - 99 mg/dL   Comment 1 Notify RN    Comment 2 Document in Chart   Glucose, capillary     Status: Abnormal   Collection Time: 08/09/16  8:12 PM  Result Value Ref Range   Glucose-Capillary 55 (L) 65 - 99 mg/dL   Comment 1 Notify RN    Comment 2 Document in Chart   Glucose, capillary     Status: None    Collection Time: 08/09/16  8:42 PM  Result Value Ref Range   Glucose-Capillary 97 65 - 99 mg/dL  Glucose, capillary     Status: Abnormal   Collection Time: 08/09/16 11:41 PM  Result Value Ref Range   Glucose-Capillary 208 (H) 65 - 99 mg/dL  Glucose, capillary     Status: Abnormal   Collection Time: 08/10/16  3:48 AM  Result Value Ref Range   Glucose-Capillary 144 (H) 65 - 99 mg/dL   Comment 1 Notify RN    Comment 2 Document in Chart   Comprehensive metabolic panel     Status: Abnormal   Collection Time: 08/10/16  4:29 AM  Result Value Ref Range   Sodium 140 135 - 145 mmol/L   Potassium 3.6 3.5 - 5.1 mmol/L   Chloride 106 101 - 111 mmol/L   CO2 25 22 - 32 mmol/L   Glucose, Bld 140 (H) 65 - 99 mg/dL   BUN 27 (H) 6 - 20 mg/dL   Creatinine, Ser 1.40 (H) 0.44 - 1.00 mg/dL   Calcium 8.3 (L) 8.9 - 10.3 mg/dL   Total Protein 5.4 (L) 6.5 - 8.1 g/dL   Albumin 2.6 (L) 3.5 - 5.0 g/dL   AST 392 (H) 15 -  41 U/L   ALT 775 (H) 14 - 54 U/L   Alkaline Phosphatase 254 (H) 38 - 126 U/L   Total Bilirubin 3.9 (H) 0.3 - 1.2 mg/dL   GFR calc non Af Amer 33 (L) >60 mL/min   GFR calc Af Amer 39 (L) >60 mL/min    Comment: (NOTE) The eGFR has been calculated using the CKD EPI equation. This calculation has not been validated in all clinical situations. eGFR's persistently <60 mL/min signify possible Chronic Kidney Disease.    Anion gap 9 5 - 15  CBC     Status: Abnormal   Collection Time: 08/10/16  4:29 AM  Result Value Ref Range   WBC 8.2 4.0 - 10.5 K/uL   RBC 3.44 (L) 3.87 - 5.11 MIL/uL   Hemoglobin 10.2 (L) 12.0 - 15.0 g/dL   HCT 31.2 (L) 36.0 - 46.0 %   MCV 90.7 78.0 - 100.0 fL   MCH 29.7 26.0 - 34.0 pg   MCHC 32.7 30.0 - 36.0 g/dL   RDW 15.3 11.5 - 15.5 %   Platelets 120 (L) 150 - 400 K/uL  Troponin I     Status: Abnormal   Collection Time: 08/10/16  4:29 AM  Result Value Ref Range   Troponin I 0.33 (HH) <0.03 ng/mL    Comment: CRITICAL VALUE NOTED.  VALUE IS CONSISTENT WITH  PREVIOUSLY REPORTED AND CALLED VALUE.  Glucose, capillary     Status: Abnormal   Collection Time: 08/10/16  7:57 AM  Result Value Ref Range   Glucose-Capillary 133 (H) 65 - 99 mg/dL    Studies/Results: Ct Abdomen Pelvis Wo Contrast  Result Date: 08/09/2016 CLINICAL DATA:  Dyspnea and nausea starting today. Elevated lactic acid levels. EXAM: CT ABDOMEN AND PELVIS WITHOUT CONTRAST TECHNIQUE: Multidetector CT imaging of the abdomen and pelvis was performed following the standard protocol without IV contrast. COMPARISON:  01/03/2016 CT and chest CT performed earlier on the same day FINDINGS: Lower chest: Borderline cardiomegaly with bibasilar atelectasis and/or scarring. No pericardial effusion. There is coronary arteriosclerosis. Hepatobiliary: Status post cholecystectomy with chronic stable dilatation of the intra and extrahepatic biliary ducts without calculus noted. The common bile duct is slightly more dilated on current exam up to 2.1 cm at the pancreatic head versus 1.7 cm in 2017. No mural thickening of the bowel ducts is identified. Lack of IV contrast limits assessment for mucosal enhancement. Patient reportedly received IV contrast earlier for a chest CT. On that exam, no definite mucosal enhancement is noted. Pancreas: Stable 2.7 cm cystic lesion in the proximal body of the pancreas. No ductal dilatation is noted. Spleen: Normal in size without focal abnormality. Adrenals/Urinary Tract: Normal bilateral adrenal glands. Small left renal cysts are again visualized. Symmetric pyelograms noted bilaterally status post prior CT administration for the chest. No obstructive uropathy. Contrast opacified urine distended bladder. Stomach/Bowel: All the stomach and small intestine are normal. There is a moderate amount of fecal retention throughout large bowel with circular muscle hypertrophy and extensive sigmoid diverticulosis noted similar in appearance to prior. The appendix is not visualized but no  pericecal inflammation is noted. Vascular/Lymphatic: Diffuse atherosclerosis of the abdominal aorta and branch vessels without aneurysm. No retroperitoneal or pelvic sidewall adenopathy. No inguinal lymphadenopathy. Reproductive: Uterus and bilateral adnexa are unremarkable. Other: No abdominal wall hernia or abnormality. No abdominopelvic ascites. Musculoskeletal: Thoracolumbar spondylosis with multilevel degenerative disc disease along the lumbar spine. Osteopenia is noted. No acute fracture nor bone destruction. IMPRESSION: 1. Coronary arteriosclerosis and aortic atherosclerosis.  2. Extensive sigmoid diverticulosis without acute diverticulitis. 3. Status post cholecystectomy with chronic intrahepatic and extrahepatic ductal dilatation without mural thickening or enhancement seen on recent same day CT with IV contrast. No conclusive findings of cholangitis. 4. Stable 2.7 cm pancreatic head cyst and small left-sided renal cysts. 5. Spondylosis of the lumbar spine. Electronically Signed   By: Ashley Royalty M.D.   On: 08/09/2016 00:04   Ct Angio Chest Pe W Or Wo Contrast  Result Date: 08/08/2016 CLINICAL DATA:  83 year old with acute onset of chest pain, shortness of breath and nausea and vomiting that began earlier today. EXAM: CT ANGIOGRAPHY CHEST WITH CONTRAST TECHNIQUE: Multidetector CT imaging of the chest was performed using the standard protocol during bolus administration of intravenous contrast. Multiplanar CT image reconstructions and MIPs were obtained to evaluate the vascular anatomy. CONTRAST:  100 mL Isovue 370 IV. COMPARISON:  Unenhanced CT chest 01/13/2015. FINDINGS: Beam hardening streak artifact is present as the patient was unable to raise the arms over the head. Respiratory motion blurred many of the images. Cardiovascular: Contrast opacification of the pulmonary arteries is very good. No filling defects within either main pulmonary artery or their segmental branches in either lung to suggest  pulmonary embolism. Heart moderately enlarged with left ventricular predominance. Severe LAD and right coronary artery atherosclerosis. No pericardial effusion. Severe atherosclerosis involving the thoracic and upper abdominal aorta without evidence of aneurysm or dissection. Gas is present within the right subclavian vein and the lower right internal jugular vein, possibly related to an intravenous injection in the right upper extremity. Mediastinum/Nodes: Scattered normal sized mediastinal lymph nodes. No pathologically enlarged mediastinal, hilar or axillary lymph nodes. No mediastinal masses. Normal-appearing esophagus. Visualized thyroid gland unremarkable. Lungs/Pleura: Low lung volumes. Scattered areas of localized hyperlucency in both lungs indicating focal air trapping. Scarring in the lingula and in the lower lobes. No confluent airspace consolidation. No significant pulmonary parenchymal nodules or masses, as there is a stable subpleural nodule adjacent to the right major fissure (series 7, image 45), indicating a subpleural lymph node. No pleural effusions. Subpleural fibrosis is present bilaterally, most prominent in the lingula. Central airways patent. Upper Abdomen: Small hiatal hernia. Prior cholecystectomy which accounts for the dilated proximal common bile duct. Scarring and a simple cyst is present in the upper pole of the visualized left kidney. Visualized upper abdomen otherwise unremarkable for the early phase of enhancement. Musculoskeletal: Lower cervical degenerative disc disease and spondylosis. Osseous demineralization. No acute abnormalities. Review of the MIP images confirms the above findings. IMPRESSION: 1. No evidence of pulmonary embolism. 2. Moderate cardiomegaly. LAD and right coronary artery atherosclerosis. 3. Severe atherosclerosis involving the thoracic and upper abdominal aorta without aneurysm. 4. Low lung volumes. Scattered areas of localized air trapping in both lungs as  can be seen in patients with asthma or bronchitis. Chronic lung changes as detailed above No acute cardiopulmonary disease otherwise. 5. Small hiatal hernia. Electronically Signed   By: Evangeline Dakin M.D.   On: 08/08/2016 20:47   Dg Chest Portable 1 View  Result Date: 08/08/2016 CLINICAL DATA:  Dyspnea and EXAM: PORTABLE CHEST 1 VIEW COMPARISON:  03/14/2016 FINDINGS: The heart size and mediastinal contours are within normal limits. Aortic atherosclerosis is noted. No pneumonic consolidation, effusion or pneumothorax. Chronic subpleural interstitial prominence may reflect areas of fibrosis, scarring and/or chronic interstitial lung disease. Osteoarthritis of both glenohumeral joints with spurring and joint space narrowing. Slight remodeling along the undersurface of the distal acromion and clavicle on the left. This  can be seen with chronic rotator cuff tear or rheumatoid arthritis. IMPRESSION: 1. Aortic atherosclerosis. 2. Mild stable interstitial prominence which may reflect subpleural fibrosis, scarring or sequela of chronic interstitial disease. 3. No acute pneumonic consolidation or CHF. Electronically Signed   By: Ashley Royalty M.D.   On: 08/08/2016 19:52   Dg Ercp  Result Date: 08/09/2016 CLINICAL DATA:  ERCP EXAM: ERCP TECHNIQUE: Multiple spot images obtained with the fluoroscopic device and submitted for interpretation post-procedure. COMPARISON:  CT abdomen pelvis - 08/08/2016 FINDINGS: 3 spot intraoperative fluoroscopic images of the right upper abdominal quadrant during ERCP are provided for review Initial image demonstrates an ERCP probe overlying the right upper abdominal quadrant. Post cholecystectomy. There is selective cannulation opacification of the common bile duct which appears markedly dilated compatible with the findings seen on preceding abdominal CT. There is no definitive opacification of the pancreatic or residual cystic ducts. There is no significant opacification of the  intrahepatic biliary system. No definitive filling defects are seen with the opacified portions of the common bile duct to suggest the presence of choledocholithiasis. Completion image demonstrates placement of a internal biliary stent overlying the distal aspect of the common bile duct. IMPRESSION: ERCP with biliary stent placement as above. These images were submitted for radiologic interpretation only. Please see the procedural report for the amount of contrast and the fluoroscopy time utilized. Electronically Signed   By: Sandi Mariscal M.D.   On: 08/09/2016 14:36   US Abdomen Limited Ruq  Result Date: 08/10/2016 CLINICAL DATA:  Cholangitis. ERCP earlier this day with stent placement. EXAM: ULTRASOUND ABDOMEN LIMITED RIGHT UPPER QUADRANT COMPARISON:  Abdominal CT yesterday. FINDINGS: Gallbladder: Surgically absent. Common bile duct: Diameter: 17 mm at the porta hepatis. 2.2 x 2.5 cm distally. Biliary stent is visualized. Liver: No focal lesion identified. Linear echogenic shadowing is likely pneumobilia related to recent ERCP. Normal directional flow in the main portal vein. 2.6 cm cyst in the pancreatic head, as seen on CT. IMPRESSION: 1. Biliary dilatation with biliary stent visualized in the common bile duct. The degree of biliary dilatation appears similar to CT yesterday. 2. Linear echogenic shadowing structures in the liver likely pneumobilia. Electronically Signed   By: Jeb Levering M.D.   On: 08/10/2016 02:27    Medications: I have reviewed the patient's current medications.  Assessment: 1. Acute cholangitis, status post ERCP, retrieval of pus- post sphincterotomy, plastic CBD stent in place  Improvement in LFTs(AST 2610 to 1391 to 392 today; ALT 1484 to 1270 to 775 today; T bili 2.6 to 3.7to 3.9 today, alkaline phosphatase 423 to 331 to 254 today). 2. Gram-negative bacteremia(Klebsiella, Enterobacter) 3. Sepsis 4. History of benign pancreatic cyst(mucinous, on FNA/ EUS)  Plan: 1.  Continue IV antibiotics, IV Zosyn 3.375 g every 8 hours 2. Expect further improvement in LFTs 3. We will need by mouth antibiotics on discharge for gram-negative bacteremia. 4. Will need EGD as an outpatient for removal of stent in 4 weeks.    Ronnette Juniper 08/10/2016, 9:56 AM   Pager [phone] If no answer or after 5 PM call [phone]

## 2016-08-11 ENCOUNTER — Inpatient Hospital Stay (HOSPITAL_COMMUNITY): Payer: PPO

## 2016-08-11 ENCOUNTER — Other Ambulatory Visit (HOSPITAL_COMMUNITY): Payer: PPO

## 2016-08-11 DIAGNOSIS — I361 Nonrheumatic tricuspid (valve) insufficiency: Secondary | ICD-10-CM

## 2016-08-11 DIAGNOSIS — K922 Gastrointestinal hemorrhage, unspecified: Secondary | ICD-10-CM

## 2016-08-11 LAB — ECHOCARDIOGRAM COMPLETE
AVLVOTPG: 5 mmHg
CHL CUP MV DEC (S): 292
E/e' ratio: 10.42
EWDT: 292 ms
FS: 27 % — AB (ref 28–44)
Height: 61 in
IV/PV OW: 0.92
LA diam end sys: 43 mm
LA vol: 45.7 mL
LADIAMINDEX: 2.57 cm/m2
LASIZE: 43 mm
LAVOLA4C: 46.7 mL
LAVOLIN: 27.4 mL/m2
LV E/e' medial: 10.42
LV E/e'average: 10.42
LV e' LATERAL: 8.38 cm/s
LVOT SV: 76 mL
LVOT VTI: 26.9 cm
LVOT area: 2.84 cm2
LVOT peak vel: 111 cm/s
LVOTD: 19 mm
Lateral S' vel: 15.1 cm/s
MV Peak grad: 3 mmHg
MV pk A vel: 127 m/s
MV pk E vel: 87.3 m/s
PW: 12 mm — AB (ref 0.6–1.1)
RV TAPSE: 17.4 mm
RV sys press: 60 mmHg
Reg peak vel: 379 cm/s
TDI e' lateral: 8.38
TDI e' medial: 6.64
TRMAXVEL: 379 cm/s
Weight: 2391.55 oz

## 2016-08-11 LAB — COMPREHENSIVE METABOLIC PANEL
ALK PHOS: 236 U/L — AB (ref 38–126)
ALT: 476 U/L — AB (ref 14–54)
AST: 120 U/L — AB (ref 15–41)
Albumin: 2.6 g/dL — ABNORMAL LOW (ref 3.5–5.0)
Anion gap: 9 (ref 5–15)
BUN: 19 mg/dL (ref 6–20)
CALCIUM: 8.9 mg/dL (ref 8.9–10.3)
CO2: 25 mmol/L (ref 22–32)
CREATININE: 1.13 mg/dL — AB (ref 0.44–1.00)
Chloride: 110 mmol/L (ref 101–111)
GFR calc Af Amer: 50 mL/min — ABNORMAL LOW (ref >60)
GFR, EST NON AFRICAN AMERICAN: 43 mL/min — AB (ref >60)
Glucose, Bld: 152 mg/dL — ABNORMAL HIGH (ref 65–99)
Potassium: 4 mmol/L (ref 3.5–5.1)
SODIUM: 144 mmol/L (ref 135–145)
Total Bilirubin: 3.2 mg/dL — ABNORMAL HIGH (ref 0.3–1.2)
Total Protein: 5.8 g/dL — ABNORMAL LOW (ref 6.5–8.1)

## 2016-08-11 LAB — HSV(HERPES SIMPLEX VRS) I + II AB-IGM: HSVI/II Comb IgM: <0.91 Ratio (ref 0.00–0.90)

## 2016-08-11 LAB — CULTURE, BLOOD (ROUTINE X 2)
SPECIAL REQUESTS: ADEQUATE
Special Requests: ADEQUATE

## 2016-08-11 LAB — CBC
HEMATOCRIT: 27.5 % — AB (ref 36.0–46.0)
HEMOGLOBIN: 8.7 g/dL — AB (ref 12.0–15.0)
MCH: 28.7 pg (ref 26.0–34.0)
MCHC: 31.6 g/dL (ref 30.0–36.0)
MCV: 90.8 fL (ref 78.0–100.0)
Platelets: 134 10*3/uL — ABNORMAL LOW (ref 150–400)
RBC: 3.03 MIL/uL — ABNORMAL LOW (ref 3.87–5.11)
RDW: 15 % (ref 11.5–15.5)
WBC: 6.7 10*3/uL (ref 4.0–10.5)

## 2016-08-11 LAB — GLUCOSE, CAPILLARY
Glucose-Capillary: 120 mg/dL — ABNORMAL HIGH (ref 65–99)
Glucose-Capillary: 126 mg/dL — ABNORMAL HIGH (ref 65–99)
Glucose-Capillary: 148 mg/dL — ABNORMAL HIGH (ref 65–99)

## 2016-08-11 LAB — PREPARE RBC (CROSSMATCH)

## 2016-08-11 MED ORDER — SODIUM CHLORIDE 0.9 % IV SOLN: Freq: Once | INTRAVENOUS | Status: DC

## 2016-08-11 MED ORDER — PANTOPRAZOLE SODIUM 40 MG IV SOLR
40.0000 mg | Freq: Two times a day (BID) | INTRAVENOUS | Status: DC
  Administered 2016-08-11 – 2016-08-16 (×10): 40 mg via INTRAVENOUS
  Filled 2016-08-11 (×11): qty 40

## 2016-08-11 MED ORDER — IOPAMIDOL (ISOVUE-300) INJECTION 61%
INTRAVENOUS | Status: AC
  Administered 2016-08-11: 30 mL
  Filled 2016-08-11: qty 30

## 2016-08-11 MED ORDER — HYDRALAZINE HCL 20 MG/ML IJ SOLN
10.0000 mg | INTRAMUSCULAR | Status: DC | PRN
  Administered 2016-08-11: 10 mg via INTRAVENOUS
  Filled 2016-08-11: qty 1

## 2016-08-11 MED ORDER — HYDRALAZINE HCL 20 MG/ML IJ SOLN
5.0000 mg | INTRAMUSCULAR | Status: DC | PRN
Start: 2016-08-11 — End: 2016-08-18
  Administered 2016-08-15 – 2016-08-17 (×2): 5 mg via INTRAVENOUS
  Filled 2016-08-11 (×2): qty 1

## 2016-08-11 MED ORDER — OXYCODONE HCL 5 MG PO TABS
5.0000 mg | ORAL_TABLET | Freq: Four times a day (QID) | ORAL | Status: DC | PRN
  Administered 2016-08-11 – 2016-08-17 (×8): 5 mg via ORAL
  Filled 2016-08-11 (×9): qty 1

## 2016-08-11 MED ORDER — AMLODIPINE BESYLATE 5 MG PO TABS: 5.0000 mg | ORAL_TABLET | Freq: Every day | ORAL | Status: DC

## 2016-08-11 MED ORDER — AMLODIPINE BESYLATE 10 MG PO TABS
10.0000 mg | ORAL_TABLET | Freq: Every day | ORAL | Status: DC
  Filled 2016-08-11: qty 1

## 2016-08-11 NOTE — Progress Notes (Signed)
Pt b/p-180/60, MD advised.

## 2016-08-11 NOTE — Progress Notes (Signed)
Pt just had a large dark bloody stool. DR notified

## 2016-08-11 NOTE — Evaluation (Signed)
Physical Therapy Evaluation Patient Details Name: [name] MRN: [id number] DOB: 12-27-1929 Today's Date: 08/11/2016   History of Present Illness  83 y.o.female admitted 08/08/16 with fever and epigastric pain.  Patient with bloody stools. S/P ERCP with CBD stent.    PMHx Bipolar disorder, HTN, DM, CKD stage III ( baseline Cr 1.1), CHF, and chronic pain (interstitial cystitis and chronic back pain)  Clinical Impression  Patient presents with problems listed below.  Will benefit from acute PT to maximize functional mobility prior to d/c.  Patient with general weakness impacting mobility/gait.  Patient able to ambulate 70' with RW and min guard assist.  Recommend HHPT for continued therapy at d/c.       Follow Up Recommendations Home health PT;Supervision/Assistance - 24 hour    Equipment Recommendations  None recommended by PT    Recommendations for Other Services       Precautions / Restrictions Precautions Precautions: Fall Restrictions Weight Bearing Restrictions: No      Mobility  Bed Mobility Overal bed mobility: Needs Assistance Bed Mobility: Supine to Sit;Sit to Supine     Supine to sit: Min assist Sit to supine: Min guard   General bed mobility comments: Assist to bring trunk to sitting position.  Transfers Overall transfer level: Needs assistance Equipment used: Rolling walker (2 wheeled) Transfers: Sit to/from Stand Sit to Stand: Min guard         General transfer comment: Verbal cues for hand placement.  No physical assist needed from bed or chair.  Ambulation/Gait Ambulation/Gait assistance: Min guard Ambulation Distance (Feet): 50 Feet (with 1 sitting rest break) Assistive device: Rolling walker (2 wheeled) Gait Pattern/deviations: Step-through pattern;Decreased stride length;Shuffle;Trunk flexed Gait velocity: decreased Gait velocity interpretation: Below normal speed for age/gender General Gait Details: Verbal cues to stand upright during gait.   Patient with slow, steady gait with RW.  Patient became nauseated during gait and sat in chair.  RN brought patient nause meds.  Stairs            Wheelchair Mobility    Modified Rankin (Stroke Patients Only)       Balance Overall balance assessment: Needs assistance         Standing balance support: Bilateral upper extremity supported Standing balance-Leahy Scale: Poor                               Pertinent Vitals/Pain Pain Assessment: Faces Faces Pain Scale: Hurts little more Pain Location: Rt upper abdomen Pain Descriptors / Indicators: Sore Pain Intervention(s): Monitored during session;Repositioned    Home Living Family/patient expects to be discharged to:: Private residence Living Arrangements: Spouse/significant other;Children (Husband; Daughter and Son-in-law) Available Help at Discharge: Family;Available 24 hours/day;Personal care attendant (Daughter lives 30-45 minutes from patient's home.  ) Type of Home: House Home Access: Stairs to enter Entrance Stairs-Rails: Psychiatric nurse of Steps: 3 Home Layout: One level Home Equipment: Environmental consultant - 4 wheels;Cane - single point;Shower seat;Bedside commode;Wheelchair - manual Additional Comments: Husband is with patient 24 hours.  Unsure of assist he can provide.    Prior Function Level of Independence: Independent with assistive device(s);Needs assistance   Gait / Transfers Assistance Needed: walks with rollator  ADL's / Homemaking Assistance Needed: Aide comes 2x/week to assist with bathing and housekeeping.  Husband cooks.        Hand Dominance   Dominant Hand: Right    Extremity/Trunk Assessment   Upper Extremity Assessment Upper  Extremity Assessment: Generalized weakness    Lower Extremity Assessment Lower Extremity Assessment: Generalized weakness       Communication   Communication: No difficulties  Cognition Arousal/Alertness: Awake/alert Behavior During  Therapy: WFL for tasks assessed/performed;Flat affect Overall Cognitive Status: Within Functional Limits for tasks assessed (For basic tasks)                                        General Comments      Exercises     Assessment/Plan    PT Assessment Patient needs continued PT services  PT Problem List Decreased strength;Decreased activity tolerance;Decreased balance;Decreased mobility;Decreased knowledge of use of DME;Pain       PT Treatment Interventions DME instruction;Gait training;Functional mobility training;Therapeutic activities;Therapeutic exercise;Patient/family education    PT Goals (Current goals can be found in the Care Plan section)  Acute Rehab PT Goals Patient Stated Goal: To go home PT Goal Formulation: With patient Time For Goal Achievement: 08/18/16 Potential to Achieve Goals: Good    Frequency Min 3X/week   Barriers to discharge        Co-evaluation               AM-PAC PT "6 Clicks" Daily Activity  Outcome Measure Difficulty turning over in bed (including adjusting bedclothes, sheets and blankets)?: A Little Difficulty moving from lying on back to sitting on the side of the bed? : Total Difficulty sitting down on and standing up from a chair with arms (e.g., wheelchair, bedside commode, etc,.)?: A Little Help needed moving to and from a bed to chair (including a wheelchair)?: A Little Help needed walking in hospital room?: A Little Help needed climbing 3-5 steps with a railing? : A Little 6 Click Score: 16    End of Session Equipment Utilized During Treatment: Gait belt Activity Tolerance: Patient limited by pain (Limited by nausea.) Patient left: in bed;with call bell/phone within reach;with bed alarm set Nurse Communication: Mobility status (Nausea) PT Visit Diagnosis: Difficulty in walking, not elsewhere classified (R26.2);Muscle weakness (generalized) (M62.81);Pain Pain - Right/Left: Right Pain - part of body:  (upper  abdomen)    Time: 5643-3295 PT Time Calculation (min) (ACUTE ONLY): 34 min   Charges:   PT Evaluation $PT Eval Moderate Complexity: 1 Procedure PT Treatments $Gait Training: 8-22 mins   PT G Codes:        Carita Pian. Sanjuana Kava, [facility] Acute Rehab Services Pager [place] 08/11/2016, 11:17 PM

## 2016-08-11 NOTE — Progress Notes (Signed)
-   Received call from Dr. Landis Gandy.  - Nursing staff reported dark bloody bowel movements. Vitals stable. CT abdomen today showed no acute changes. Drop in Hgb from 10.2 to 8.7.  - Possible post sphincterotomy bleeding. Keep Patient NPO. Monitor H and H . Transfuse if Hgb <8.  - Consider EGD with side viewing scope  tomorrow if ongoing bleeding.      Otis Brace MD, FACP 08/11/2016, 5:08 PM  Pager [phone]  If no answer or after 5 PM call [phone]

## 2016-08-11 NOTE — Progress Notes (Signed)
Patient just had another large dark bloody bowel movements. First unit of PRBCs still ongoing.

## 2016-08-11 NOTE — Progress Notes (Signed)
  Echocardiogram 2D Echocardiogram has been performed.  [name] 08/11/2016, 4:31 PM

## 2016-08-11 NOTE — Progress Notes (Signed)
[facility] Gastroenterology Progress Note  [name] 83 y.o. 03-Aug-1929  CC:  cholangitis   Subjective: Patient complaining of worsening right-sided pain. Had low-grade fever. Denied any nausea or vomiting. Complaining of constipation but had a bowel movement last night. Denied any blood in the stool.  ROS : Negative for chest pain or shortness of breath.   Objective: Vital signs in last 24 hours: Vitals:   08/11/16 0453 08/11/16 0540  BP:  (!) 180/60  Pulse: 74   Resp: 18   Temp: 100.1 F (37.8 C)     Physical Exam:  General:  Alert, cooperative, no distress, appears stated age  Head:  Normocephalic, without obvious abnormality, atraumatic  Eyes:  , EOM's intact,Mild scleral icterus   Lungs:   Clear to auscultation bilaterally, respirations unlabored. Anterior exam only   Heart:  Regular rate and rhythm, S1, S2 normal  Abdomen:   Right upper quadrant and right lower quadrant tenderness to palpation, abdomen is somewhat distended. Bowel sounds present. No peritoneal sign.   Extremities: Extremities normal, atraumatic, no  edema       Lab Results:  Recent Labs  08/09/16 0441 08/10/16 0429  NA 139 140  K 3.4* 3.6  CL 102 106  CO2 24 25  GLUCOSE 248* 140*  BUN 29* 27*  CREATININE 1.44* 1.40*  CALCIUM 8.8* 8.3*    Recent Labs  08/09/16 0441 08/10/16 0429  AST 1,391* 392*  ALT 1,270* 775*  ALKPHOS 331* 254*  BILITOT 3.7* 3.9*  PROT 5.5* 5.4*  ALBUMIN 2.9* 2.6*    Recent Labs  08/08/16 1845 08/09/16 0441 08/10/16 0429  WBC 9.5 15.3* 8.2  NEUTROABS 7.9*  --   --   HGB 13.0 10.6* 10.2*  HCT 40.3 32.5* 31.2*  MCV 90.6 89.3 90.7  PLT 214 166 120*    Recent Labs  08/08/16 2323  LABPROT 14.5  INR 1.13      Assessment/Plan: - Ascending cholangitis. Status post ERCP with sphincterotomy,  removal of pus and plastic stent placement. - Gram-negative bacteremia - Sepsis. - Acute kidney injury  Recommendations ------------------------- - Patient is  complaining of worsening right-sided pain. Denied any nausea and vomiting. She is having low-grade fever. Blood work pending this morning. She has mild kidney insufficiency with creatinine of 1.4. I will repeat CT abdomen pelvis without contrast for further evaluation. Continue Zosyn for now. Diet as tolerated. Repeat CBC and CMP in the morning. GI will follow.   Otis Brace MD, [place] 08/11/2016, 8:30 AM  Pager [phone]  If no answer or after 5 PM call [phone]

## 2016-08-11 NOTE — Progress Notes (Addendum)
PROGRESS NOTE    [name]  ZOX:[id number] DOB: 06-May-1929 DOA: 08/08/2016 PCP: Lavone Orn, MD   Brief Narrative:  83 y.o.WF PMHx Bipolar disorder, HTN, DM, CKD stage III ( baseline Cr 1.1), CHF, and chronic pain (interstitial cystitis and chronic back pain) Who presented with fever and epigastric pain.  In the ED she had a temp of 102.63F, AST/ALT 2610/1484, Total bilirubin 2.6, alkaline phosphatase >400, and normal Lipase.  CT angiogram of the chest showed a dilated CBD to 2 cm (similar to previous images), status post cholecystectomy.  Subjective: Complains of right upper quadrant pain this a.m., reports she had good bowel movement today, tolerating diet, no nausea or vomiting   Assessment & Plan:   Principal Problem:   Sepsis ([place]) Active Problems:   CKD (chronic kidney disease), stage III   Chronic diastolic congestive heart failure (HCC)   Essential hypertension, malignant   Diabetes mellitus with complication (HCC)   Bipolar disorder (HCC)   Recurrent cholangitis   Elevated troponin   Sepsis due to Klebsiella bacteremia/Cholangitis - Resolving S/P stent placement CBD - Per GI Patient will need an EGD in 1 month, as an outpatient for removal of CBD stent which will be scheduled. - Patient is tolerating soft diet, but continues to have right upper quadrant pain, plan for repeat CT abdomen pelvis without contrast today. - Check CMP in a.m.   Klebsiella pneumoniae Bacteremia - Continue with IV Zosyn, follow on sensitivities   Elevated troponin -Most likely secondary to demand ischemia, trending down continue to monitor. However given patient's. History of moderate LVH and pulmonary hypertension , will follow on echocardiogram Recent Labs     08/08/16  2323  08/09/16  0441  08/09/16  1522  08/10/16  0429  TROPONINI  0.41*  1.03*  0.49*  0.33*    CKD stage III (Baseline creatinine 1.1) Lab Results  Component Value Date   CREATININE 1.13 (H) 08/11/2016   CREATININE 1.40 (H) 08/10/2016   CREATININE 1.44 (H) 08/09/2016    Chronic diastolic congestive heart failure - Appears to be euvolemic, monitor closely as an IV fluids  Pulmonary hypertension - Follow on 2-D echo  Essential HTN - Blood pressure uncontrolled, on home dose metoprolol, so will resume back on amlodipine but will increase home dose from 5-10, as well on when necessary hydralazine  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 Tora Perches, patient will be scheduled for endoscopy tomorrow, this is most likely post ERCP., And it usually tops within 24-48 hours per GI, she is nothing by mouth, aspirin and Lovenox has been stopped, will transfer to stepdown, and will monitor H&H closely  DVT prophylaxis: Lovenox Code Status: NO CODE - DNR  Family Communication: no family present at time of exam  Disposition Plan: Pending PT consult     Consultants:  [facility]    Procedures/Significant Events:  6/7 ERCP - diffusely dilated CBD (2cm) w/ no visible filling defect - sphincterotomy performed leading to drainage of pus - CBD stent placed  plastic CBD stent 5 cm 7 [place]    VENTILATOR SETTINGS:    Cultures 6/6 Hepatitis A/B/C negative 6/6 blood positive Enterobacteriaceae species/Klebsiella pneumoniae 6/6 urine positive multiple species 6/7 MRSA by PCR positive      Antimicrobials: Anti-infectives    Start     Stop   08/09/16 2000  vancomycin (VANCOCIN) 500 mg in sodium  chloride 0.9 % 100 mL IVPB  Status:  Discontinued     08/09/16 0125   08/09/16 0400  piperacillin-tazobactam (ZOSYN) IVPB 3.375 g         08/08/16 1930  piperacillin-tazobactam (ZOSYN) IVPB 3.375 g     08/08/16 2104   08/08/16 1930  vancomycin (VANCOCIN) IVPB 1000 mg/200 mL premix     08/08/16 2116       Devices    LINES / TUBES:  None    Continuous Infusions: . sodium chloride 75 mL/hr at 08/10/16 1137  . piperacillin-tazobactam (ZOSYN)  IV Stopped (08/11/16 1010)     Objective: Vitals:   08/10/16 2145 08/11/16 0453 08/11/16 0540 08/11/16 1000  BP: (!) 122/45  (!) 180/60 (!) 171/63  Pulse:  74  88  Resp:  18  18  Temp:  100.1 F (37.8 C)  98.5 F (36.9 C)  TempSrc:  Oral  Oral  SpO2:  100%  94%  Weight:      Height:        Intake/Output Summary (Last 24 hours) at 08/11/16 1133 Last data filed at 08/11/16 0854  Gross per 24 hour  Intake           1947.5 ml  Output             1900 ml  Net             47.5 ml   Filed Weights   08/08/16 1921 08/09/16 0500 08/10/16 1205  Weight: 59.9 kg (132 lb) 65.1 kg (143 lb 8.3 oz) 67.8 kg (149 lb 7.6 oz)    Examination:  Awake alert time 3, no apparent distress Supple neck, no JVD Good air entry bilaterally, clear to auscultation, no wheezing Regular rate and rhythm, no rubs murmurs gallops Abdomen soft, some right upper quadrant tenderness, no rebound or guarding, bowel sounds present Extremities with no edema, clubbing or cyanosis.   Data Reviewed: Care during the described time interval was provided by me .  I have reviewed this patient's available data, including medical history, events of note, physical examination, and all test results as part of my evaluation. I have personally reviewed and interpreted all radiology studies.  CBC:  Recent Labs Lab 08/08/16 1845 08/09/16 0441 08/10/16 0429  WBC 9.5 15.3* 8.2  NEUTROABS 7.9*  --   --   HGB 13.0 10.6* 10.2*  HCT 40.3 32.5* 31.2*  MCV 90.6 89.3 90.7    PLT 214 166 536*   Basic Metabolic Panel:  Recent Labs Lab 08/08/16 1845 08/09/16 0441 08/10/16 0429 08/11/16 0758  NA 135 139 140 144  K 3.9 3.4* 3.6 4.0  CL 96* 102 106 110  CO2 24 24 25 25   GLUCOSE 271* 248* 140* 152*  BUN 32* 29* 27* 19  CREATININE 1.24* 1.44* 1.40* 1.13*  CALCIUM 9.6 8.8* 8.3* 8.9   GFR: Estimated Creatinine Clearance: 32.1 mL/min (A) (by C-G formula based on SCr of 1.13 mg/dL (H)). Liver Function Tests:  Recent Labs Lab 08/08/16 1845 08/09/16 0441 08/10/16 0429 08/11/16 0758  AST 2,610* 1,391* 392* 120*  ALT 1,484* 1,270* 775* 476*  ALKPHOS 423* 331* 254* 236*  BILITOT 2.6* 3.7* 3.9* 3.2*  PROT 7.4 5.5* 5.4* 5.8*  ALBUMIN 3.8 2.9* 2.6* 2.6*    Recent Labs Lab 08/08/16 1845  LIPASE 42   No results for input(s): AMMONIA in the last 168 hours. Coagulation Profile:  Recent Labs Lab 08/08/16 2323  INR 1.13   Cardiac Enzymes:  Recent Labs Lab 08/08/16 2323 08/09/16 0441 08/09/16 1522 08/10/16 0429  CKTOTAL 21*  --   --   --   TROPONINI 0.41* 1.03* 0.49* 0.33*   BNP (last 3 results) No results for input(s): PROBNP in the last 8760 hours. HbA1C: No results for input(s): HGBA1C in the last 72 hours. CBG:  Recent Labs Lab 08/10/16 1156 08/10/16 1645 08/10/16 2030 08/11/16 0042 08/11/16 0422  GLUCAP 122* 125* 141* 148* 126*   Lipid Profile:  Recent Labs  08/10/16 2141  CHOL 166  HDL 14*  LDLCALC 91  TRIG 305*  CHOLHDL 11.9   Thyroid Function Tests: No results for input(s): TSH, T4TOTAL, FREET4, T3FREE, THYROIDAB in the last 72 hours. Anemia Panel: No results for input(s): VITAMINB12, FOLATE, FERRITIN, TIBC, IRON, RETICCTPCT in the last 72 hours. Urine analysis:    Component Value Date/Time   COLORURINE YELLOW 08/08/2016 2004   APPEARANCEUR HAZY (A) 08/08/2016 2004   LABSPEC 1.014 08/08/2016 2004   PHURINE 5.0 08/08/2016 2004   GLUCOSEU 150 (A) 08/08/2016 2004   HGBUR NEGATIVE 08/08/2016 2004    BILIRUBINUR NEGATIVE 08/08/2016 2004   [place] NEGATIVE 08/08/2016 2004   PROTEINUR 30 (A) 08/08/2016 2004   UROBILINOGEN 0.2 11/20/2014 1405   NITRITE NEGATIVE 08/08/2016 2004   LEUKOCYTESUR NEGATIVE 08/08/2016 2004   Sepsis Labs: @LABRCNTIP (procalcitonin:4,lacticidven:4)  ) Recent Results (from the past 240 hour(s))  Blood Culture (routine x 2)     Status: Abnormal   Collection Time: 08/08/16  7:20 PM  Result Value Ref Range Status   Specimen Description BLOOD RIGHT WRIST  Final   Special Requests   Final    BOTTLES DRAWN AEROBIC AND ANAEROBIC Blood Culture adequate volume   Culture  Setup Time   Final    GRAM NEGATIVE RODS IN BOTH AEROBIC AND ANAEROBIC BOTTLES CRITICAL VALUE NOTED.  VALUE IS CONSISTENT WITH PREVIOUSLY REPORTED AND CALLED VALUE.    Culture (A)  Final    KLEBSIELLA PNEUMONIAE SUSCEPTIBILITIES PERFORMED ON PREVIOUS CULTURE WITHIN THE LAST 5 DAYS.    Report Status 08/11/2016 FINAL  Final  Blood Culture (routine x 2)     Status: Abnormal   Collection Time: 08/08/16  8:01 PM  Result Value Ref Range Status   Specimen Description BLOOD LEFT ANTECUBITAL  Final   Special Requests IN PEDIATRIC BOTTLE Blood Culture adequate volume  Final   Culture  Setup Time   Final    GRAM NEGATIVE RODS IN PEDIATRIC BOTTLE Organism ID to follow CRITICAL RESULT CALLED TO, READ BACK BY AND VERIFIED WITH: Hughie Closs Pharm.D. 10:15 08/09/16 ([email or web address])    Culture KLEBSIELLA PNEUMONIAE (A)  Final   Report Status 08/11/2016 FINAL  Final   Organism ID, Bacteria KLEBSIELLA PNEUMONIAE  Final      Susceptibility   Klebsiella pneumoniae - MIC*    AMPICILLIN >=32 RESISTANT Resistant     CEFAZOLIN <=4 SENSITIVE Sensitive     CEFEPIME <=1 SENSITIVE Sensitive     CEFTAZIDIME <=1 SENSITIVE Sensitive     CEFTRIAXONE <=1 SENSITIVE Sensitive     CIPROFLOXACIN <=0.25 SENSITIVE Sensitive     GENTAMICIN <=1 SENSITIVE Sensitive  IMIPENEM <=0.25 SENSITIVE Sensitive     TRIMETH/SULFA <=20  SENSITIVE Sensitive     AMPICILLIN/SULBACTAM 4 SENSITIVE Sensitive     PIP/TAZO <=4 SENSITIVE Sensitive     Extended ESBL NEGATIVE Sensitive     * KLEBSIELLA PNEUMONIAE  Blood Culture ID Panel (Reflexed)     Status: Abnormal   Collection Time: 08/08/16  8:01 PM  Result Value Ref Range Status   Enterococcus species NOT DETECTED NOT DETECTED Final   Listeria monocytogenes NOT DETECTED NOT DETECTED Final   Staphylococcus species NOT DETECTED NOT DETECTED Final   Staphylococcus aureus NOT DETECTED NOT DETECTED Final   Streptococcus species NOT DETECTED NOT DETECTED Final   Streptococcus agalactiae NOT DETECTED NOT DETECTED Final   Streptococcus pneumoniae NOT DETECTED NOT DETECTED Final   Streptococcus pyogenes NOT DETECTED NOT DETECTED Final   Acinetobacter baumannii NOT DETECTED NOT DETECTED Final   Enterobacteriaceae species DETECTED (A) NOT DETECTED Final    Comment: Enterobacteriaceae represent a large family of gram-negative bacteria, not a single organism. CRITICAL RESULT CALLED TO, READ BACK BY AND VERIFIED WITH: Hughie Closs Pharm.D. 10:15 08/09/16 ([email or web address])    Enterobacter cloacae complex NOT DETECTED NOT DETECTED Final   Escherichia coli NOT DETECTED NOT DETECTED Final   Klebsiella oxytoca NOT DETECTED NOT DETECTED Final   Klebsiella pneumoniae DETECTED (A) NOT DETECTED Final    Comment: CRITICAL RESULT CALLED TO, READ BACK BY AND VERIFIED WITH: Hughie Closs Pharm.D. 10:15 08/09/16 ([email or web address])    Proteus species NOT DETECTED NOT DETECTED Final   Serratia marcescens NOT DETECTED NOT DETECTED Final   Carbapenem resistance NOT DETECTED NOT DETECTED Final   Haemophilus influenzae NOT DETECTED NOT DETECTED Final   Neisseria meningitidis NOT DETECTED NOT DETECTED Final   Pseudomonas aeruginosa NOT DETECTED NOT DETECTED Final   Candida albicans NOT DETECTED NOT DETECTED Final   Candida glabrata NOT DETECTED NOT DETECTED Final   Candida krusei NOT DETECTED NOT DETECTED Final   Candida  parapsilosis NOT DETECTED NOT DETECTED Final   Candida tropicalis NOT DETECTED NOT DETECTED Final  Urine culture     Status: Abnormal   Collection Time: 08/08/16  8:04 PM  Result Value Ref Range Status   Specimen Description URINE, RANDOM  Final   Special Requests NONE  Final   Culture MULTIPLE SPECIES PRESENT, SUGGEST RECOLLECTION (A)  Final   Report Status 08/10/2016 FINAL  Final  MRSA PCR Screening     Status: Abnormal   Collection Time: 08/09/16  4:07 AM  Result Value Ref Range Status   MRSA by PCR POSITIVE (A) NEGATIVE Final    Comment:        The GeneXpert MRSA Assay (FDA approved for NASAL specimens only), is one component of a comprehensive MRSA colonization surveillance program. It is not intended to diagnose MRSA infection nor to guide or monitor treatment for MRSA infections. RESULT CALLED TO, READ BACK BY AND VERIFIED WITH: D.CORREA RN AT [id number] 08/09/16 BY A.DAVIS          Radiology Studies: Dg Ercp  Result Date: 08/09/2016 CLINICAL DATA:  ERCP EXAM: ERCP TECHNIQUE: Multiple spot images obtained with the fluoroscopic device and submitted for interpretation post-procedure. COMPARISON:  CT abdomen pelvis - 08/08/2016 FINDINGS: 3 spot intraoperative fluoroscopic images of the right upper abdominal quadrant during ERCP are provided for review Initial image demonstrates an ERCP probe overlying the right upper abdominal quadrant. Post cholecystectomy. There is selective cannulation opacification of the common bile duct which appears markedly dilated compatible with the findings seen  on preceding abdominal CT. There is no definitive opacification of the pancreatic or residual cystic ducts. There is no significant opacification of the intrahepatic biliary system. No definitive filling defects are seen with the opacified portions of the common bile duct to suggest the presence of choledocholithiasis. Completion image demonstrates placement of a internal biliary stent overlying the  distal aspect of the common bile duct. IMPRESSION: ERCP with biliary stent placement as above. These images were submitted for radiologic interpretation only. Please see the procedural report for the amount of contrast and the fluoroscopy time utilized. Electronically Signed   By: Sandi Mariscal M.D.   On: 08/09/2016 14:36   US Abdomen Limited Ruq  Result Date: 08/10/2016 CLINICAL DATA:  Cholangitis. ERCP earlier this day with stent placement. EXAM: ULTRASOUND ABDOMEN LIMITED RIGHT UPPER QUADRANT COMPARISON:  Abdominal CT yesterday. FINDINGS: Gallbladder: Surgically absent. Common bile duct: Diameter: 17 mm at the porta hepatis. 2.2 x 2.5 cm distally. Biliary stent is visualized. Liver: No focal lesion identified. Linear echogenic shadowing is likely pneumobilia related to recent ERCP. Normal directional flow in the main portal vein. 2.6 cm cyst in the pancreatic head, as seen on CT. IMPRESSION: 1. Biliary dilatation with biliary stent visualized in the common bile duct. The degree of biliary dilatation appears similar to CT yesterday. 2. Linear echogenic shadowing structures in the liver likely pneumobilia. Electronically Signed   By: Jeb Levering M.D.   On: 08/10/2016 02:27        Scheduled Meds: . aspirin EC  81 mg Oral Daily  . atorvastatin  5 mg Oral Daily  . Chlorhexidine Gluconate Cloth  6 each Topical Q0600  . enoxaparin (LOVENOX) injection  30 mg Subcutaneous Q24H  . FLUoxetine  20 mg Oral Daily  . insulin aspart  0-9 Units Subcutaneous Q4H  . iopamidol      . lithium carbonate  150 mg Oral Daily  . metoprolol tartrate  12.5 mg Oral BID  . mirtazapine  30 mg Oral QHS  . mupirocin ointment  1 application Nasal BID  . oxybutynin  5 mg Oral BID  . polyethylene glycol  17 g Oral Daily  . Vilazodone HCl  20 mg Oral Daily  . zolpidem  2.5 mg Oral QHS   Continuous Infusions: . sodium chloride 75 mL/hr at 08/10/16 1137  . piperacillin-tazobactam (ZOSYN)  IV Stopped (08/11/16 1010)      LOS: 2 days     Phillips Climes, MD [facility] Pager [phone]  If 7PM-7AM, please contact night-coverage [email or web address] Password [email or web address] 08/11/2016, 11:33 AM

## 2016-08-11 NOTE — Progress Notes (Signed)
Dr Notified that patient has had another large bloody stool and Hgb has dropped.New orders received to transfuse pt blood and to transfer to stepdown ICU. Report called to [facility]. Pts belongings are packed up and pt is being transported by charge RN and tech.

## 2016-08-12 ENCOUNTER — Encounter (HOSPITAL_COMMUNITY)
Admission: EM | Disposition: A | Discharge status: Home Health | Payer: Self-pay | Source: Home / Self Care | Attending: Internal Medicine

## 2016-08-12 ENCOUNTER — Inpatient Hospital Stay (HOSPITAL_COMMUNITY): Payer: PPO

## 2016-08-12 ENCOUNTER — Encounter (HOSPITAL_COMMUNITY): Payer: Self-pay | Admitting: Certified Registered"

## 2016-08-12 ENCOUNTER — Inpatient Hospital Stay (HOSPITAL_COMMUNITY): Payer: PPO | Admitting: Certified Registered"

## 2016-08-12 DIAGNOSIS — K921 Melena: Secondary | ICD-10-CM

## 2016-08-12 DIAGNOSIS — R740 Nonspecific elevation of levels of transaminase and lactic acid dehydrogenase [LDH]: Secondary | ICD-10-CM

## 2016-08-12 HISTORY — PX: ESOPHAGOGASTRODUODENOSCOPY (EGD) WITH PROPOFOL: SHX5813

## 2016-08-12 LAB — CBC
HCT: 30.3 % — ABNORMAL LOW (ref 36.0–46.0)
HEMATOCRIT: 26.7 % — AB (ref 36.0–46.0)
HEMOGLOBIN: 8.7 g/dL — AB (ref 12.0–15.0)
HEMOGLOBIN: 9.9 g/dL — AB (ref 12.0–15.0)
MCH: 28.2 pg (ref 26.0–34.0)
MCH: 28.3 pg (ref 26.0–34.0)
MCHC: 32.6 g/dL (ref 30.0–36.0)
MCHC: 32.7 g/dL (ref 30.0–36.0)
MCV: 86.7 fL (ref 78.0–100.0)
MCV: 86.6 fL (ref 78.0–100.0)
Platelets: 114 10*3/uL — ABNORMAL LOW (ref 150–400)
Platelets: 112 10*3/uL — ABNORMAL LOW (ref 150–400)
RBC: 3.08 MIL/uL — ABNORMAL LOW (ref 3.87–5.11)
RBC: 3.5 MIL/uL — ABNORMAL LOW (ref 3.87–5.11)
RDW: 16.7 % — ABNORMAL HIGH (ref 11.5–15.5)
RDW: 16.6 % — AB (ref 11.5–15.5)
WBC: 6.4 10*3/uL (ref 4.0–10.5)
WBC: 8.2 10*3/uL (ref 4.0–10.5)

## 2016-08-12 LAB — COMPREHENSIVE METABOLIC PANEL
ALBUMIN: 1.9 g/dL — AB (ref 3.5–5.0)
ALT: 230 U/L — ABNORMAL HIGH (ref 14–54)
ANION GAP: 5 (ref 5–15)
AST: 41 U/L (ref 15–41)
Alkaline Phosphatase: 134 U/L — ABNORMAL HIGH (ref 38–126)
BILIRUBIN TOTAL: 2 mg/dL — AB (ref 0.3–1.2)
BUN: 18 mg/dL (ref 6–20)
CO2: 24 mmol/L (ref 22–32)
Calcium: 7.8 mg/dL — ABNORMAL LOW (ref 8.9–10.3)
Chloride: 112 mmol/L — ABNORMAL HIGH (ref 101–111)
Creatinine, Ser: 1.04 mg/dL — ABNORMAL HIGH (ref 0.44–1.00)
GFR calc Af Amer: 55 mL/min — ABNORMAL LOW (ref >60)
GFR calc non Af Amer: 48 mL/min — ABNORMAL LOW (ref >60)
GLUCOSE: 150 mg/dL — AB (ref 65–99)
Potassium: 4.3 mmol/L (ref 3.5–5.1)
SODIUM: 141 mmol/L (ref 135–145)
TOTAL PROTEIN: 4.1 g/dL — AB (ref 6.5–8.1)

## 2016-08-12 LAB — GLUCOSE, CAPILLARY
GLUCOSE-CAPILLARY: 134 mg/dL — AB (ref 65–99)
GLUCOSE-CAPILLARY: 130 mg/dL — AB (ref 65–99)
GLUCOSE-CAPILLARY: 112 mg/dL — AB (ref 65–99)
GLUCOSE-CAPILLARY: 115 mg/dL — AB (ref 65–99)
GLUCOSE-CAPILLARY: 109 mg/dL — AB (ref 65–99)
Glucose-Capillary: 115 mg/dL — ABNORMAL HIGH (ref 65–99)
Glucose-Capillary: 158 mg/dL — ABNORMAL HIGH (ref 65–99)
Glucose-Capillary: 97 mg/dL (ref 65–99)

## 2016-08-12 LAB — HEMOGLOBIN A1C
Hgb A1c MFr Bld: 6.2 % — ABNORMAL HIGH (ref 4.8–5.6)
Mean Plasma Glucose: 131 mg/dL

## 2016-08-12 LAB — PREPARE RBC (CROSSMATCH)

## 2016-08-12 SURGERY — ESOPHAGOGASTRODUODENOSCOPY (EGD) WITH PROPOFOL
Anesthesia: Monitor Anesthesia Care

## 2016-08-12 MED ORDER — SODIUM CHLORIDE 0.9 % IV SOLN: INTRAVENOUS | Status: DC

## 2016-08-12 MED ORDER — PROPOFOL 500 MG/50ML IV EMUL
INTRAVENOUS | Status: DC | PRN
  Administered 2016-08-12: 100 ug/kg/min via INTRAVENOUS

## 2016-08-12 MED ORDER — LIDOCAINE 2% (20 MG/ML) 5 ML SYRINGE
INTRAMUSCULAR | Status: DC | PRN
  Administered 2016-08-12: 40 mg via INTRAVENOUS

## 2016-08-12 MED ORDER — SODIUM CHLORIDE 0.9 % IV SOLN: Freq: Once | INTRAVENOUS | Status: DC

## 2016-08-12 MED ORDER — PIPERACILLIN-TAZOBACTAM 3.375 G IVPB: 3.3750 g | Freq: Three times a day (TID) | INTRAVENOUS | Status: DC

## 2016-08-12 MED ORDER — TECHNETIUM TC 99M-LABELED RED BLOOD CELLS IV KIT
25.4000 | PACK | Freq: Once | INTRAVENOUS | Status: AC | PRN
  Administered 2016-08-12: 25.4 via INTRAVENOUS

## 2016-08-12 MED ORDER — FUROSEMIDE 10 MG/ML IJ SOLN: 20.0000 mg | Freq: Once | INTRAMUSCULAR | Status: DC | PRN

## 2016-08-12 SURGICAL SUPPLY — 15 items

## 2016-08-12 NOTE — Progress Notes (Signed)
Bright red blood noted from rectum. Dr Salley Hews notified.

## 2016-08-12 NOTE — Progress Notes (Signed)
Dr. [facility] called to check on patient. Gave a verbal order to check H&H 1 hour after 2 units of RBC's have finished and if less than 9.0 to transfuse another 2 units. He also ordered Metoprolol 12.5 mg for 6/09 2200 to be held. He ordered for Lasix 20mg  IV if she developed SOB, Tachypnea, decrease in oxygen saturation.  Will continue to monitor closely.

## 2016-08-12 NOTE — Op Note (Signed)
[facility] Patient Name: [name] Procedure Date : 08/12/2016 MRN: [id number] Attending MD: Otis Brace , MD Date of Birth: Aug 09, 1929 CSN: [id number] Age: 83 Admit Type: Inpatient Procedure:                Upper GI endoscopy Indications:              Active gastrointestinal bleeding Providers:                Otis Brace, MD, Lillie Fragmin, RN, Alan Mulder, Technician, Cherylynn Ridges, Technician Referring MD:              Medicines:                Sedation Administered by an Anesthesia Professional Complications:            No immediate complications. Estimated Blood Loss:     Estimated blood loss was minimal. Procedure:                Pre-Anesthesia Assessment:                           - Prior to the procedure, a History and Physical                            was performed, and patient medications and                            allergies were reviewed. The patient's tolerance of                            previous anesthesia was also reviewed. The risks                            and benefits of the procedure and the sedation                            options and risks were discussed with the patient.                            All questions were answered, and informed consent                            was obtained. Prior Anticoagulants: The patient has                            taken no previous anticoagulant or antiplatelet                            agents. ASA Grade Assessment: III - A patient with                            severe systemic disease. After reviewing the risks  and benefits, the patient was deemed in                            satisfactory condition to undergo the procedure.                           After obtaining informed consent, the endoscope was                            passed under direct vision. Throughout the                            procedure, the patient's blood  pressure, pulse, and                            oxygen saturations were monitored continuously. The                            EG-[id number] ([id number]) scope was introduced through the                            mouth, and advanced to the second part of duodenum.                            The Duodenoscope was introduced through the mouth,                            and advanced to the area of papilla. The upper GI                            endoscopy was accomplished without difficulty. The                            patient tolerated the procedure fairly well. Scope In: Scope Out: Findings:      There is no endoscopic evidence of bleeding, ulcerations or varices in       the entire esophagus.      There is no endoscopic evidence of bleeding, inflammation or ulceration       in the entire examined stomach.      The cardia and gastric fundus were normal on retroflexion.      The duodenal bulb, first portion of the duodenum and second portion of       the duodenum were normal.      A large diverticulum was found in the second portion of the duodenum.      - standard EGD scope was then withdrawn.The duodenoscope was introduced.       Scope was advanced up to the ampullary orifice. Large periampullary       diverticulum again identified. There was no evidence of bleeding at the       ampullary orifice. I Was not able to visualize previously placed plastic       stent.several minutes were spent examining this area. There was no       evidence of any bleeding. Scope was then withdrawn. Procedure was       completed. Impression:               -  Normal duodenal bulb, first portion of the                            duodenum and second portion of the duodenum.                           - Duodenal diverticulum.                           - No specimens collected. Moderate Sedation:      Moderate (conscious) sedation was personally administered by an       anesthesia professional. The following  parameters were monitored: oxygen       saturation, heart rate, blood pressure, and response to care. Recommendation:           - Return patient to hospital ward for ongoing care.                           - Clear liquid diet.                           - Continue present medications.                           - Do a GI bleeding (tagged RBC) scan if symptoms                            persist. Procedure Code(s):        --- Professional ---                           [phone], Esophagogastroduodenoscopy, flexible,                            transoral; diagnostic, including collection of                            specimen(s) by brushing or washing, when performed                            (separate procedure) Diagnosis Code(s):        --- Professional ---                           K92.2, Gastrointestinal hemorrhage, unspecified                           K57.10, Diverticulosis of small intestine without                            perforation or abscess without bleeding CPT copyright 2016 [employer]. All rights reserved. The codes documented in this report are preliminary and upon coder review may  be revised to meet current compliance requirements. Otis Brace, MD Otis Brace, MD 08/12/2016 8:13:22 AM Number of Addenda: 0

## 2016-08-12 NOTE — Progress Notes (Signed)
Pt taken to nuclear medicine at this time.

## 2016-08-12 NOTE — Progress Notes (Signed)
Pt transported to endo at this time.  

## 2016-08-12 NOTE — Transfer of Care (Signed)
Immediate Anesthesia Transfer of Care Note  Patient: [name]  Procedure(s) Performed: Procedure(s): ESOPHAGOGASTRODUODENOSCOPY (EGD) WITH PROPOFOL/ EGD with side viewing scope (N/A)  Patient Location: Endoscopy Unit  Anesthesia Type:MAC  Level of Consciousness: sedated and responds to stimulation  Airway & Oxygen Therapy: Patient Spontanous Breathing and Patient connected to nasal cannula oxygen  Post-op Assessment: Report given to RN and Post -op Vital signs reviewed and stable  Post vital signs: Reviewed and stable  Last Vitals:  Vitals:   08/12/16 0655 08/12/16 0723  BP: (!) 119/48 (!) 163/41  Pulse: 64 67  Resp: 17 20  Temp: 36.6 C     Last Pain:  Vitals:   08/12/16 0723  TempSrc: Oral  PainSc:       Patients Stated Pain Goal: 0 (95/63/[id number])  Complications: No apparent anesthesia complications

## 2016-08-12 NOTE — Progress Notes (Signed)
[facility] Gastroenterology Progress Note  [name] 83 y.o. February 09, 1930  CC:  cholangitis   Subjective: Patient started noticing dark bloody bowel movement yesterday afternoon. CT negative for any acute changes. Hemoglobin 8.7 , no improvement after 2 units of blood transfusion.  ROS : Negative for chest pain or shortness of breath.   Objective: Vital signs in last 24 hours: Vitals:   08/12/16 0655 08/12/16 0723  BP: (!) 119/48 (!) 163/41  Pulse: 64 67  Resp: 17 20  Temp: 97.9 F (36.6 C)     Physical Exam:  General:  Alert, cooperative, no distress, appears stated age  Head:  Normocephalic, without obvious abnormality, atraumatic  Eyes:  , EOM's intact,Mild scleral icterus   Lungs:   Clear to auscultation bilaterally, respirations unlabored. Anterior exam only   Heart:  Regular rate and rhythm, S1, S2 normal  Abdomen:   Right upper quadrant and right lower quadrant tenderness to palpation, abdomen is somewhat distended. Bowel sounds present. No peritoneal sign.   Extremities: Extremities normal, atraumatic, no  edema       Lab Results:  Recent Labs  08/11/16 0758 08/12/16 0150  NA 144 141  K 4.0 4.3  CL 110 112*  CO2 25 24  GLUCOSE 152* 150*  BUN 19 18  CREATININE 1.13* 1.04*  CALCIUM 8.9 7.8*    Recent Labs  08/11/16 0758 08/12/16 0150  AST 120* 41  ALT 476* 230*  ALKPHOS 236* 134*  BILITOT 3.2* 2.0*  PROT 5.8* 4.1*  ALBUMIN 2.6* 1.9*    Recent Labs  08/11/16 1504 08/12/16 0150  WBC 6.7 6.4  HGB 8.7* 8.7*  HCT 27.5* 26.7*  MCV 90.8 86.7  PLT 134* 114*   No results for input(s): LABPROT, INR in the last 72 hours.    Assessment/Plan: - GI bleed with the dark stools. Need to rule out post-sphincterotomy bleeding.  Ascending cholangitis. Status post ERCP with sphincterotomy,  removal of pus and plastic stent placement. LFTs trending down - Gram-negative bacteremia - Sepsis. - Acute kidney injury.  Improving  Recommendations ------------------------- - EGD today for further evaluation. Risk benefits alternatives discussed with the patient. She verbalized understanding. It would be relatively difficult procedure secondary to large periampullary diverticulum as well as plastic stent at the ampullary orifice .If not able to control bleeding through endoscopy, she may need interventional radiology evaluation for possible embolization. Further plan based on endoscopic finding.   Otis Brace MD, [place] 08/12/2016, 7:35 AM  Pager [phone]  If no answer or after 5 PM call [phone]

## 2016-08-12 NOTE — Brief Op Note (Signed)
08/08/2016 - 08/12/2016  8:15 AM  PATIENT:  [name]  83 y.o. female  PRE-OPERATIVE DIAGNOSIS:  GI Bleed.  POST-OPERATIVE DIAGNOSIS:  no source of UGI bleed  PROCEDURE:  Procedure(s): ESOPHAGOGASTRODUODENOSCOPY (EGD) WITH PROPOFOL/ EGD with side viewing scope (N/A)  SURGEON:  Surgeon(s) and Role:    * Kevonte Vanecek, MD - Primary  Findings/recommendations --------------------------------------- - EGD with forward-viewing and side-viewing scope showed no evidence of bleeding. - I was not able to visualize previously placed plastic stent. - X-ray abdomen to localize plastic stent. - GI bleeding scan. - Keep nothing by mouth for now. Monitor H&H. Transfuse as needed.    Otis Brace MD, FACP 08/12/2016, 8:16 AM  Pager [phone]  If no answer or after 5 PM call [phone]

## 2016-08-12 NOTE — Anesthesia Postprocedure Evaluation (Signed)
Anesthesia Post Note  Patient: [name]  Procedure(s) Performed: Procedure(s) (LRB): ESOPHAGOGASTRODUODENOSCOPY (EGD) WITH PROPOFOL/ EGD with side viewing scope (N/A)     Patient location during evaluation: PACU Anesthesia Type: MAC Level of consciousness: awake Pain management: pain level controlled Vital Signs Assessment: post-procedure vital signs reviewed and stable Respiratory status: spontaneous breathing Cardiovascular status: stable Anesthetic complications: no    Last Vitals:  Vitals:   08/12/16 0808 08/12/16 0810  BP: 127/66   Pulse: 89 81  Resp: 17 (!) 21  Temp: 36.8 C     Last Pain:  Vitals:   08/12/16 0808  TempSrc: Oral  PainSc:                  Deedra Pro

## 2016-08-12 NOTE — Progress Notes (Signed)
PROGRESS NOTE    [name]  ZOX:[id number] DOB: 1929-10-09 DOA: 08/08/2016 PCP: Lavone Orn, MD   Brief Narrative:  83 y.o.WF PMHx Bipolar disorder, HTN, DM, CKD stage III ( baseline Cr 1.1), CHF, and chronic pain (interstitial cystitis and chronic back pain) Who presented with fever and epigastric pain. She was found to have cholangitis, status post ERCP 08/09/2016, patient started to have dark bloody bowel movement 08/11/2016, will she has been transfused total of 4 units PRBC, repeat endoscopy 6/10 with no evidence of active GI bleed, as well with negative bleeding scan.  Subjective: Patient with significant multiple dark bloody bowel movements developed yesterday afternoon , she is currently receiving her fourth unit PRBC transfusion , reports her abdominal pain has improved, denies chest pain, shortness of breath or dizziness .  Assessment & Plan:   Principal Problem:   Sepsis ([place]) Active Problems:   CKD (chronic kidney disease), stage III   Chronic diastolic congestive heart failure (HCC)   Essential hypertension, malignant   Diabetes mellitus with complication (HCC)   Bipolar disorder (HCC)   Recurrent cholangitis   Elevated troponin   Sepsis due to Klebsiella bacteremia/Cholangitis - Resolving S/P stent placement CBD - Per GI Patient will need an EGD in 1 month, as an outpatient for removal of CBD stent which will be scheduled. - Repeat CT abdomen and pelvis 6/9 with no acute finding to explain her complaints of right upper quadrant abdominal pain. -  LFTs trending down  Transaminitis - Secondary to above, they are trending down.  Klebsiella pneumoniae Bacteremia - Continue with IV Zosyn  GI bleed - Patient with dark stools, went for EGD today, no evidence of post-sphincterotomy bleeding. - Bleeding scan with no evidence of GI bleed - Monitor H&H closely and transfuse as needed  Acute blood loss anemia - This is secondary to GI bleed, but no evidence of upper GI  bleeding on endoscopy, as well no evidence of active bleed and bleeding scan, this is most likely related to upper GI bleed post-sphincterotomy which has really stopped. - Monitor H&H and transfuse, received 4 units PRBC.  Elevated troponin -Most likely secondary to demand ischemia, trending down continue to monitor. However given patient's. History of moderate LVH and pulmonary hypertension , repeat 2-D echo with EF 55-60%, with no regional wall motion abnormalities, and grade 1 diastolic dysfunction Recent Labs     08/09/16  1522  08/10/16  0429  TROPONINI  0.49*  0.33*    CKD stage III (Baseline creatinine 1.1) Lab Results  Component Value Date   CREATININE 1.04 (H) 08/12/2016   CREATININE 1.13 (H) 08/11/2016   CREATININE 1.40 (H) 08/10/2016    Chronic diastolic congestive heart failure - Appears to be euvolemic, monitor closely as an IV fluids   Essential HTN - Currently blood pressure acceptable, amlodipine has been stopped secondary to GI bleed, continue with metoprolol, and when necessary hydralazine .  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 - Continue with insulin sliding scale, hemoglobin A1c is 6.2  Bipolar disorder -Lithium 150 mg daily -Remeron 30 mg daily  MRSA screen positive   DVT prophylaxis: Lovenox Code Status: NO CODE - DNR  Family Communication:  daughter at bedside Disposition Plan: Pending PT consult     Consultants:  [facility]    Procedures/Significant Events:  6/7 ERCP - diffusely dilated CBD (2cm) w/ no visible filling defect - sphincterotomy performed leading to drainage of pus - CBD stent placed  plastic CBD stent 5  cm 7 French  6/10 EGD   Cultures 6/6 Hepatitis A/B/C negative 6/6 blood positive Enterobacteriaceae species/Klebsiella pneumoniae 6/6 urine positive multiple species 6/7 MRSA by PCR positive      Antimicrobials: Anti-infectives    Start     Stop   08/09/16 2000  vancomycin (VANCOCIN) 500 mg in  sodium chloride 0.9 % 100 mL IVPB  Status:  Discontinued     08/09/16 0125   08/09/16 0400  piperacillin-tazobactam (ZOSYN) IVPB 3.375 g         08/08/16 1930  piperacillin-tazobactam (ZOSYN) IVPB 3.375 g     08/08/16 2104   08/08/16 1930  vancomycin (VANCOCIN) IVPB 1000 mg/200 mL premix     08/08/16 2116       Devices    LINES / TUBES:  None    Continuous Infusions: . sodium chloride 75 mL/hr (08/12/16 0916)  . sodium chloride    . sodium chloride    . sodium chloride    . piperacillin-tazobactam (ZOSYN)  IV Stopped (08/12/16 0838)     Objective: Vitals:   08/12/16 1200 08/12/16 1257 08/12/16 1309 08/12/16 1324  BP: (!) 133/45 (!) 134/49 (!) 144/50 (!) 146/49  Pulse: 72 73 73 71  Resp: 19 18 (!) 21 18  Temp:  97.7 F (36.5 C) 97.6 F (36.4 C) 98.5 F (36.9 C)  TempSrc:  Axillary Oral   SpO2: 97% 99%  99%  Weight:      Height:        Intake/Output Summary (Last 24 hours) at 08/12/16 1352 Last data filed at 08/12/16 1324  Gross per 24 hour  Intake          3494.75 ml  Output              525 ml  Net          2969.75 ml   Filed Weights   08/08/16 1921 08/09/16 0500 08/10/16 1205  Weight: 59.9 kg (132 lb) 65.1 kg (143 lb 8.3 oz) 67.8 kg (149 lb 7.6 oz)    Examination:  Awake alert and 3, no apparent distress,  supple neck, no JVD, Awake alert time 3, no apparent distress Good air entry bilaterally, clear to auscultation, no wheezing, Regular rate and rhythm, no rubs murmurs gallops Abdomen soft, right upper quadrant tenderness significantly subsided, no rebound, no guarding, bowel sounds present Extremities with no edema, clubbing or cyanosis   Data Reviewed: Care during the described time interval was provided by me .  I have reviewed this patient's available data, including medical history, events of note, physical examination, and all test results as part of my evaluation. I have personally reviewed and interpreted all radiology  studies.  CBC:  Recent Labs Lab 08/08/16 1845 08/09/16 0441 08/10/16 0429 08/11/16 1504 08/12/16 0150  WBC 9.5 15.3* 8.2 6.7 6.4  NEUTROABS 7.9*  --   --   --   --   HGB 13.0 10.6* 10.2* 8.7* 8.7*  HCT 40.3 32.5* 31.2* 27.5* 26.7*  MCV 90.6 89.3 90.7 90.8 86.7  PLT 214 166 120* 134* 093*   Basic Metabolic Panel:  Recent Labs Lab 08/08/16 1845 08/09/16 0441 08/10/16 0429 08/11/16 0758 08/12/16 0150  NA 135 139 140 144 141  K 3.9 3.4* 3.6 4.0 4.3  CL 96* 102 106 110 112*  CO2 24 24 25 25 24   GLUCOSE 271* 248* 140* 152* 150*  BUN 32* 29* 27* 19 18  CREATININE 1.24* 1.44* 1.40* 1.13* 1.04*  CALCIUM 9.6  8.8* 8.3* 8.9 7.8*   GFR: Estimated Creatinine Clearance: 34.8 mL/min (A) (by C-G formula based on SCr of 1.04 mg/dL (H)). Liver Function Tests:  Recent Labs Lab 08/08/16 1845 08/09/16 0441 08/10/16 0429 08/11/16 0758 08/12/16 0150  AST 2,610* 1,391* 392* 120* 41  ALT 1,484* 1,270* 775* 476* 230*  ALKPHOS 423* 331* 254* 236* 134*  BILITOT 2.6* 3.7* 3.9* 3.2* 2.0*  PROT 7.4 5.5* 5.4* 5.8* 4.1*  ALBUMIN 3.8 2.9* 2.6* 2.6* 1.9*    Recent Labs Lab 08/08/16 1845  LIPASE 42   No results for input(s): AMMONIA in the last 168 hours. Coagulation Profile:  Recent Labs Lab 08/08/16 2323  INR 1.13   Cardiac Enzymes:  Recent Labs Lab 08/08/16 2323 08/09/16 0441 08/09/16 1522 08/10/16 0429  CKTOTAL 21*  --   --   --   TROPONINI 0.41* 1.03* 0.49* 0.33*   BNP (last 3 results) No results for input(s): PROBNP in the last 8760 hours. HbA1C:  Recent Labs  08/10/16 2141  HGBA1C 6.2*   CBG:  Recent Labs Lab 08/11/16 2040 08/12/16 0005 08/12/16 0416 08/12/16 0906 08/12/16 1256  GLUCAP 120* 158* 134* 130* 112*   Lipid Profile:  Recent Labs  08/10/16 2141  CHOL 166  HDL 14*  LDLCALC 91  TRIG 305*  CHOLHDL 11.9   Thyroid Function Tests: No results for input(s): TSH, T4TOTAL, FREET4, T3FREE, THYROIDAB in the last 72 hours. Anemia  Panel: No results for input(s): VITAMINB12, FOLATE, FERRITIN, TIBC, IRON, RETICCTPCT in the last 72 hours. Urine analysis:    Component Value Date/Time   COLORURINE YELLOW 08/08/2016 2004   APPEARANCEUR HAZY (A) 08/08/2016 2004   LABSPEC 1.014 08/08/2016 2004   PHURINE 5.0 08/08/2016 2004   GLUCOSEU 150 (A) 08/08/2016 2004   HGBUR NEGATIVE 08/08/2016 2004   BILIRUBINUR NEGATIVE 08/08/2016 2004   [place] NEGATIVE 08/08/2016 2004   PROTEINUR 30 (A) 08/08/2016 2004   UROBILINOGEN 0.2 11/20/2014 1405   NITRITE NEGATIVE 08/08/2016 2004   LEUKOCYTESUR NEGATIVE 08/08/2016 2004   Sepsis Labs: @LABRCNTIP (procalcitonin:4,lacticidven:4)  ) Recent Results (from the past 240 hour(s))  Blood Culture (routine x 2)     Status: Abnormal   Collection Time: 08/08/16  7:20 PM  Result Value Ref Range Status   Specimen Description BLOOD RIGHT WRIST  Final   Special Requests   Final    BOTTLES DRAWN AEROBIC AND ANAEROBIC Blood Culture adequate volume   Culture  Setup Time   Final    GRAM NEGATIVE RODS IN BOTH AEROBIC AND ANAEROBIC BOTTLES CRITICAL VALUE NOTED.  VALUE IS CONSISTENT WITH PREVIOUSLY REPORTED AND CALLED VALUE.    Culture (A)  Final    KLEBSIELLA PNEUMONIAE SUSCEPTIBILITIES PERFORMED ON PREVIOUS CULTURE WITHIN THE LAST 5 DAYS.    Report Status 08/11/2016 FINAL  Final  Blood Culture (routine x 2)     Status: Abnormal   Collection Time: 08/08/16  8:01 PM  Result Value Ref Range Status   Specimen Description BLOOD LEFT ANTECUBITAL  Final   Special Requests IN PEDIATRIC BOTTLE Blood Culture adequate volume  Final   Culture  Setup Time   Final    GRAM NEGATIVE RODS IN PEDIATRIC BOTTLE Organism ID to follow CRITICAL RESULT CALLED TO, READ BACK BY AND VERIFIED WITH: Hughie Closs Pharm.D. 10:15 08/09/16 ([email or web address])    Culture KLEBSIELLA PNEUMONIAE (A)  Final   Report Status 08/11/2016 FINAL  Final   Organism ID, Bacteria KLEBSIELLA PNEUMONIAE  Final      Susceptibility   Klebsiella  pneumoniae - MIC*    AMPICILLIN >=32 RESISTANT Resistant     CEFAZOLIN <=4 SENSITIVE Sensitive     CEFEPIME <=1 SENSITIVE Sensitive     CEFTAZIDIME <=1 SENSITIVE Sensitive     CEFTRIAXONE <=1 SENSITIVE Sensitive     CIPROFLOXACIN <=0.25 SENSITIVE Sensitive     GENTAMICIN <=1 SENSITIVE Sensitive     IMIPENEM <=0.25 SENSITIVE Sensitive     TRIMETH/SULFA <=20 SENSITIVE Sensitive     AMPICILLIN/SULBACTAM 4 SENSITIVE Sensitive     PIP/TAZO <=4 SENSITIVE Sensitive     Extended ESBL NEGATIVE Sensitive     * KLEBSIELLA PNEUMONIAE  Blood Culture ID Panel (Reflexed)     Status: Abnormal   Collection Time: 08/08/16  8:01 PM  Result Value Ref Range Status   Enterococcus species NOT DETECTED NOT DETECTED Final   Listeria monocytogenes NOT DETECTED NOT DETECTED Final   Staphylococcus species NOT DETECTED NOT DETECTED Final   Staphylococcus aureus NOT DETECTED NOT DETECTED Final   Streptococcus species NOT DETECTED NOT DETECTED Final   Streptococcus agalactiae NOT DETECTED NOT DETECTED Final   Streptococcus pneumoniae NOT DETECTED NOT DETECTED Final   Streptococcus pyogenes NOT DETECTED NOT DETECTED Final   Acinetobacter baumannii NOT DETECTED NOT DETECTED Final   Enterobacteriaceae species DETECTED (A) NOT DETECTED Final    Comment: Enterobacteriaceae represent a large family of gram-negative bacteria, not a single organism. CRITICAL RESULT CALLED TO, READ BACK BY AND VERIFIED WITH: Hughie Closs Pharm.D. 10:15 08/09/16 ([email or web address])    Enterobacter cloacae complex NOT DETECTED NOT DETECTED Final   Escherichia coli NOT DETECTED NOT DETECTED Final   Klebsiella oxytoca NOT DETECTED NOT DETECTED Final   Klebsiella pneumoniae DETECTED (A) NOT DETECTED Final    Comment: CRITICAL RESULT CALLED TO, READ BACK BY AND VERIFIED WITH: Hughie Closs Pharm.D. 10:15 08/09/16 ([email or web address])    Proteus species NOT DETECTED NOT DETECTED Final   Serratia marcescens NOT DETECTED NOT DETECTED Final   Carbapenem resistance NOT  DETECTED NOT DETECTED Final   Haemophilus influenzae NOT DETECTED NOT DETECTED Final   Neisseria meningitidis NOT DETECTED NOT DETECTED Final   Pseudomonas aeruginosa NOT DETECTED NOT DETECTED Final   Candida albicans NOT DETECTED NOT DETECTED Final   Candida glabrata NOT DETECTED NOT DETECTED Final   Candida krusei NOT DETECTED NOT DETECTED Final   Candida parapsilosis NOT DETECTED NOT DETECTED Final   Candida tropicalis NOT DETECTED NOT DETECTED Final  Urine culture     Status: Abnormal   Collection Time: 08/08/16  8:04 PM  Result Value Ref Range Status   Specimen Description URINE, RANDOM  Final   Special Requests NONE  Final   Culture MULTIPLE SPECIES PRESENT, SUGGEST RECOLLECTION (A)  Final   Report Status 08/10/2016 FINAL  Final  MRSA PCR Screening     Status: Abnormal   Collection Time: 08/09/16  4:07 AM  Result Value Ref Range Status   MRSA by PCR POSITIVE (A) NEGATIVE Final    Comment:        The GeneXpert MRSA Assay (FDA approved for NASAL specimens only), is one component of a comprehensive MRSA colonization surveillance program. It is not intended to diagnose MRSA infection nor to guide or monitor treatment for MRSA infections. RESULT CALLED TO, READ BACK BY AND VERIFIED WITH: [facility] RN AT [id number] 08/09/16 BY A.DAVIS          Radiology Studies: Ct Abdomen Pelvis Wo Contrast  Result Date: 08/11/2016 CLINICAL DATA:  Worsening right-sided pain and low-grade fever in a patient that is  status post cholecystectomy. Chronic kidney disease. Diabetes. Endoscopic ultrasound 02/23/2015. ERCP 2 days ago. Cholecystectomy 12/21/2015. EXAM: CT ABDOMEN AND PELVIS WITHOUT CONTRAST TECHNIQUE: Multidetector CT imaging of the abdomen and pelvis was performed following the standard protocol without IV contrast. COMPARISON:  Ultrasound of 08/09/2016. CT of 08/08/2016. ERCP of 08/09/2016. FINDINGS: Lower chest: Clear lung bases. Cardiomegaly with small bilateral pleural effusions.  Hepatobiliary: No focal liver lesion. Cholecystectomy. Moderate intrahepatic biliary duct dilatation persists. Pneumobilia is new. Interval placement of a distal common duct stent, which terminates in the descending duodenum Pancreas: Pancreatic atrophy. No duct dilatation. Cystic lesion in the pancreatic head measures 2.5 cm on image 26/ series 3 and is similar to on the prior. No evidence of acute pancreatitis. Spleen: Normal in size, without focal abnormality. Adrenals/Urinary Tract: Normal adrenal glands. Mild renal cortical thinning bilaterally. low-density left renal lesions are likely cysts. No hydronephrosis. Degraded evaluation of the pelvis, secondary to beam hardening artifact from left hip arthroplasty. Foley catheter in the urinary bladder. Stomach/Bowel: Normal stomach, without wall thickening. Scattered colonic diverticula. Normal terminal ileum. Duodenal diverticulum. Otherwise normal small bowel. Vascular/Lymphatic: Aortic and branch vessel atherosclerosis. No retroperitoneal or retrocrural adenopathy. Reproductive: Uterus and adnexa not well evaluated. Other: Trace pelvic fluid, including on image 72/series 3. No free intraperitoneal air. No abdominal ascites. Musculoskeletal: Left hip arthroplasty. Osteopenia. Convex left lumbar spine curvature. Advanced lumbosacral spondylosis. Minimal superior endplate compression deformity at L1 is unchanged. IMPRESSION: 1. Interval placement of a distal common duct stent. Persistent moderate intrahepatic biliary duct dilatation with new pneumobilia. Common duct not well evaluated. 2. No explanation for abdominal pain. 3. Degraded evaluation of the pelvis, secondary to beam hardening artifact from left hip arthroplasty. Trace cul-de-sac fluid is identified. 4.  Aortic atherosclerosis. 5. Small bilateral pleural effusions. 6. Similar size of a pancreatic head cystic lesion. Electronically Signed   By: Abigail Miyamoto M.D.   On: 08/11/2016 15:34   Nm Gi Blood  Loss  Result Date: 08/12/2016 CLINICAL DATA:  Gastrointestinal bleeding EXAM: NUCLEAR MEDICINE GASTROINTESTINAL BLEEDING SCAN TECHNIQUE: Sequential abdominal images were obtained following intravenous administration of Tc-45m labeled red blood cells. Images were obtained over a 2 hour time span. RADIOPHARMACEUTICALS:  25.4 mCi Tc-85m in-vitro labeled red cells. COMPARISON:  CT abdomen and pelvis August 11, 2016 FINDINGS: Images were obtained over a 2 hour time span. During that time interval, there is no abnormal radiotracer uptake to suggest a focus of gastrointestinal bleeding. No abnormality appreciable. IMPRESSION: No abnormality appreciable. In particular, no demonstrable site of active gastroesophageal bleeding is demonstrated on this study. Electronically Signed   By: Lowella Grip III M.D.   On: 08/12/2016 13:33        Scheduled Meds: . atorvastatin  5 mg Oral Daily  . Chlorhexidine Gluconate Cloth  6 each Topical Q0600  . FLUoxetine  20 mg Oral Daily  . insulin aspart  0-9 Units Subcutaneous Q4H  . lithium carbonate  150 mg Oral Daily  . metoprolol tartrate  12.5 mg Oral BID  . mirtazapine  30 mg Oral QHS  . mupirocin ointment  1 application Nasal BID  . oxybutynin  5 mg Oral BID  . pantoprazole (PROTONIX) IV  40 mg Intravenous Q12H  . polyethylene glycol  17 g Oral Daily  . Vilazodone HCl  20 mg Oral Daily  . zolpidem  2.5 mg Oral QHS   Continuous Infusions: . sodium chloride 75 mL/hr (08/12/16 0916)  . sodium chloride    . sodium chloride    .  sodium chloride    . piperacillin-tazobactam (ZOSYN)  IV Stopped (08/12/16 0932)     LOS: 3 days     Phillips Climes, MD [facility] Pager [phone]  If 7PM-7AM, please contact night-coverage [email or web address] Password [email or web address] 08/12/2016, 1:52 PM

## 2016-08-12 NOTE — Anesthesia Preprocedure Evaluation (Addendum)
Anesthesia Evaluation  Patient identified by MRN, date of birth, ID band Patient awake    Reviewed: Allergy & Precautions, NPO status , Patient's Chart, lab work & pertinent test results  Airway Mallampati: II  TM Distance: >3 FB     Dental   Pulmonary    breath sounds clear to auscultation       Cardiovascular hypertension, +CHF   Rhythm:Regular Rate:Normal     Neuro/Psych  Neuromuscular disease    GI/Hepatic Neg liver ROS, History noted. CG   Endo/Other  diabetes  Renal/GU Renal disease     Musculoskeletal   Abdominal   Peds  Hematology  (+) anemia ,   Anesthesia Other Findings   Reproductive/Obstetrics                            Anesthesia Physical Anesthesia Plan  ASA: III  Anesthesia Plan: MAC   Post-op Pain Management:    Induction: Intravenous  PONV Risk Score and Plan: 3 and Ondansetron, Dexamethasone, Propofol, Midazolam and Treatment may vary due to age or medical condition  Airway Management Planned: Simple Face Mask  Additional Equipment:   Intra-op Plan:   Post-operative Plan:   Informed Consent: I have reviewed the patients History and Physical, chart, labs and discussed the procedure including the risks, benefits and alternatives for the proposed anesthesia with the patient or authorized representative who has indicated his/her understanding and acceptance.   Dental advisory given  Plan Discussed with: CRNA and Anesthesiologist  Anesthesia Plan Comments:         Anesthesia Quick Evaluation

## 2016-08-13 ENCOUNTER — Inpatient Hospital Stay (HOSPITAL_COMMUNITY): Payer: PPO

## 2016-08-13 ENCOUNTER — Encounter (HOSPITAL_COMMUNITY)
Admission: EM | Disposition: A | Discharge status: Home Health | Payer: Self-pay | Source: Home / Self Care | Attending: Internal Medicine

## 2016-08-13 ENCOUNTER — Encounter (HOSPITAL_COMMUNITY): Payer: Self-pay | Admitting: *Deleted

## 2016-08-13 HISTORY — PX: IR ANGIOGRAM VISCERAL SELECTIVE: IMG657

## 2016-08-13 HISTORY — PX: IR US GUIDE VASC ACCESS RIGHT: IMG2390

## 2016-08-13 HISTORY — PX: FLEXIBLE SIGMOIDOSCOPY: SHX5431

## 2016-08-13 HISTORY — PX: IR ANGIO/SPINAL LEFT: IMG2270

## 2016-08-13 LAB — CBC WITH DIFFERENTIAL/PLATELET
BASOS PCT: 1 %
Basophils Absolute: 0.1 10*3/uL (ref 0.0–0.1)
EOS PCT: 1 %
Eosinophils Absolute: 0.1 10*3/uL (ref 0.0–0.7)
HEMATOCRIT: 23.2 % — AB (ref 36.0–46.0)
Hemoglobin: 7.9 g/dL — ABNORMAL LOW (ref 12.0–15.0)
LYMPHS PCT: 31 %
Lymphs Abs: 3.3 10*3/uL (ref 0.7–4.0)
MCH: 29.4 pg (ref 26.0–34.0)
MCHC: 34.1 g/dL (ref 30.0–36.0)
MCV: 86.2 fL (ref 78.0–100.0)
Monocytes Absolute: 1.1 10*3/uL — ABNORMAL HIGH (ref 0.1–1.0)
Monocytes Relative: 10 %
NEUTROS ABS: 6.1 10*3/uL (ref 1.7–7.7)
NEUTROS PCT: 57 %
Platelets: 121 10*3/uL — ABNORMAL LOW (ref 150–400)
RBC: 2.69 MIL/uL — ABNORMAL LOW (ref 3.87–5.11)
RDW: 15 % (ref 11.5–15.5)
WBC: 10.7 10*3/uL — ABNORMAL HIGH (ref 4.0–10.5)

## 2016-08-13 LAB — COMPREHENSIVE METABOLIC PANEL
ALBUMIN: 1.8 g/dL — AB (ref 3.5–5.0)
ALT: 133 U/L — ABNORMAL HIGH (ref 14–54)
AST: 23 U/L (ref 15–41)
Alkaline Phosphatase: 92 U/L (ref 38–126)
Anion gap: 3 — ABNORMAL LOW (ref 5–15)
BILIRUBIN TOTAL: 1.1 mg/dL (ref 0.3–1.2)
BUN: 19 mg/dL (ref 6–20)
CO2: 24 mmol/L (ref 22–32)
CREATININE: 1.02 mg/dL — AB (ref 0.44–1.00)
Calcium: 7.5 mg/dL — ABNORMAL LOW (ref 8.9–10.3)
Chloride: 114 mmol/L — ABNORMAL HIGH (ref 101–111)
GFR calc Af Amer: 56 mL/min — ABNORMAL LOW (ref >60)
GFR calc non Af Amer: 49 mL/min — ABNORMAL LOW (ref >60)
GLUCOSE: 123 mg/dL — AB (ref 65–99)
POTASSIUM: 4.7 mmol/L (ref 3.5–5.1)
Sodium: 141 mmol/L (ref 135–145)
TOTAL PROTEIN: 3.7 g/dL — AB (ref 6.5–8.1)

## 2016-08-13 LAB — HEMOGLOBIN AND HEMATOCRIT, BLOOD
HCT: 23.1 % — ABNORMAL LOW (ref 36.0–46.0)
HCT: 28.9 % — ABNORMAL LOW (ref 36.0–46.0)
HEMOGLOBIN: 7.8 g/dL — AB (ref 12.0–15.0)
Hemoglobin: 9.9 g/dL — ABNORMAL LOW (ref 12.0–15.0)

## 2016-08-13 LAB — CBC
HEMATOCRIT: 21.2 % — AB (ref 36.0–46.0)
HEMOGLOBIN: 7 g/dL — AB (ref 12.0–15.0)
MCH: 28.6 pg (ref 26.0–34.0)
MCHC: 33 g/dL (ref 30.0–36.0)
MCV: 86.5 fL (ref 78.0–100.0)
Platelets: 102 10*3/uL — ABNORMAL LOW (ref 150–400)
RBC: 2.45 MIL/uL — AB (ref 3.87–5.11)
RDW: 16.5 % — AB (ref 11.5–15.5)
WBC: 7.4 10*3/uL (ref 4.0–10.5)

## 2016-08-13 LAB — GLUCOSE, CAPILLARY
GLUCOSE-CAPILLARY: 111 mg/dL — AB (ref 65–99)
GLUCOSE-CAPILLARY: 111 mg/dL — AB (ref 65–99)
Glucose-Capillary: 104 mg/dL — ABNORMAL HIGH (ref 65–99)
Glucose-Capillary: 118 mg/dL — ABNORMAL HIGH (ref 65–99)
Glucose-Capillary: 122 mg/dL — ABNORMAL HIGH (ref 65–99)
Glucose-Capillary: 137 mg/dL — ABNORMAL HIGH (ref 65–99)

## 2016-08-13 LAB — PREPARE RBC (CROSSMATCH)

## 2016-08-13 SURGERY — SIGMOIDOSCOPY, FLEXIBLE
Anesthesia: Moderate Sedation

## 2016-08-13 MED ORDER — SODIUM CHLORIDE 0.9% FLUSH: 10.0000 mL | INTRAVENOUS | Status: DC | PRN

## 2016-08-13 MED ORDER — SODIUM CHLORIDE 0.9 % IV SOLN
Freq: Once | INTRAVENOUS | Status: AC
  Administered 2016-08-13: 08:00:00 via INTRAVENOUS

## 2016-08-13 MED ORDER — NITROGLYCERIN 1 MG/10 ML FOR IR/CATH LAB
INTRA_ARTERIAL | Status: AC
  Filled 2016-08-13: qty 10

## 2016-08-13 MED ORDER — IOPAMIDOL (ISOVUE-300) INJECTION 61%
INTRAVENOUS | Status: AC
  Administered 2016-08-13: 20 mL
  Filled 2016-08-13: qty 150

## 2016-08-13 MED ORDER — MIDAZOLAM HCL 5 MG/ML IJ SOLN
INTRAMUSCULAR | Status: AC
Start: 2016-08-13 — End: 2016-08-13
  Filled 2016-08-13: qty 2

## 2016-08-13 MED ORDER — FENTANYL CITRATE (PF) 100 MCG/2ML IJ SOLN
INTRAMUSCULAR | Status: DC | PRN
  Administered 2016-08-13 (×3): 12.5 ug via INTRAVENOUS

## 2016-08-13 MED ORDER — FENTANYL CITRATE (PF) 100 MCG/2ML IJ SOLN
INTRAMUSCULAR | Status: AC
  Filled 2016-08-13: qty 2

## 2016-08-13 MED ORDER — SODIUM CHLORIDE 0.9 % IV SOLN: Freq: Once | INTRAVENOUS | Status: DC

## 2016-08-13 MED ORDER — DEXTROSE 5 % IV SOLN
2.0000 g | INTRAVENOUS | Status: DC
  Administered 2016-08-13 – 2016-08-17 (×5): 2 g via INTRAVENOUS
  Filled 2016-08-13 (×5): qty 2

## 2016-08-13 MED ORDER — IOPAMIDOL (ISOVUE-300) INJECTION 61%
INTRAVENOUS | Status: AC
  Administered 2016-08-13: 50 mL
  Filled 2016-08-13: qty 100

## 2016-08-13 MED ORDER — NITROGLYCERIN 1 MG/10 ML FOR IR/CATH LAB
INTRA_ARTERIAL | Status: AC | PRN
  Administered 2016-08-13: 100 ug via INTRA_ARTERIAL

## 2016-08-13 MED ORDER — MIDAZOLAM HCL 2 MG/2ML IJ SOLN
INTRAMUSCULAR | Status: AC | PRN
  Administered 2016-08-13: 0.5 mg via INTRAVENOUS
  Administered 2016-08-13: 1 mg via INTRAVENOUS

## 2016-08-13 MED ORDER — SODIUM CHLORIDE 0.9 % IV SOLN
INTRAVENOUS | Status: DC
  Administered 2016-08-13: 14:00:00 via INTRAVENOUS

## 2016-08-13 MED ORDER — LIDOCAINE HCL 1 % IJ SOLN
INTRAMUSCULAR | Status: AC | PRN
  Administered 2016-08-13: 10 mL

## 2016-08-13 MED ORDER — MIDAZOLAM HCL 2 MG/2ML IJ SOLN
INTRAMUSCULAR | Status: AC
  Filled 2016-08-13: qty 4

## 2016-08-13 MED ORDER — MIDAZOLAM HCL 10 MG/2ML IJ SOLN
INTRAMUSCULAR | Status: DC | PRN
  Administered 2016-08-13 (×2): 1 mg via INTRAVENOUS

## 2016-08-13 MED ORDER — LIDOCAINE HCL 1 % IJ SOLN
INTRAMUSCULAR | Status: AC
  Filled 2016-08-13: qty 20

## 2016-08-13 MED ORDER — FENTANYL CITRATE (PF) 100 MCG/2ML IJ SOLN
INTRAMUSCULAR | Status: AC | PRN
  Administered 2016-08-13: 25 ug via INTRAVENOUS

## 2016-08-13 MED ORDER — IOPAMIDOL (ISOVUE-300) INJECTION 61%
INTRAVENOUS | Status: AC
  Administered 2016-08-13: 50 mL
  Filled 2016-08-13: qty 50

## 2016-08-13 MED ORDER — SODIUM CHLORIDE 0.9% FLUSH
10.0000 mL | Freq: Two times a day (BID) | INTRAVENOUS | Status: DC
  Administered 2016-08-14 – 2016-08-17 (×7): 10 mL
  Administered 2016-08-18: 20 mL
  Administered 2016-08-18: 10 mL

## 2016-08-13 NOTE — Progress Notes (Signed)
Per GI MD this RN gave pt a soap suds enema to prep for procedure. ~1000cc given. Bright-dark red blood immediately returned. Flexiseal placed per instructions. VSS. F/u H&H ordered for 1u PRBC. Will continue to monitor and assess pt closely.

## 2016-08-13 NOTE — Sedation Documentation (Signed)
IR sttill holding pressure R groin post exoseal placement

## 2016-08-13 NOTE — Progress Notes (Signed)
Peripherally Inserted Central Catheter/Midline Placement  The IV Nurse has discussed with the patient and/or persons authorized to consent for the patient, the purpose of this procedure and the potential benefits and risks involved with this procedure.  The benefits include less needle sticks, lab draws from the catheter, and the patient may be discharged home with the catheter. Risks include, but not limited to, infection, bleeding, blood clot (thrombus formation), and puncture of an artery; nerve damage and irregular heartbeat and possibility to perform a PICC exchange if needed/ordered by physician.  Alternatives to this procedure were also discussed.  Bard Power PICC patient education guide, fact sheet on infection prevention and patient information card has been provided to patient /or left at bedside.    PICC/Midline Placement Documentation  PICC Double Lumen 08/13/16 PICC Right Cephalic 39 cm 0 cm (Active)  Indication for Insertion or Continuance of Line Poor Vasculature-patient has had multiple peripheral attempts or PIVs lasting less than 24 hours 08/13/2016  8:50 PM  Exposed Catheter (cm) 0 cm 08/13/2016  8:50 PM  Site Assessment Clean;Dry;Intact 08/13/2016  8:50 PM  Lumen #1 Status Blood return noted;Flushed;Saline locked 08/13/2016  8:50 PM  Lumen #2 Status Blood return noted;Flushed;Saline locked 08/13/2016  8:50 PM  Dressing Type Transparent 08/13/2016  8:50 PM  Dressing Status Clean;Dry;Intact;Antimicrobial disc in place 08/13/2016  8:50 PM  Dressing Intervention New dressing 08/13/2016  8:50 PM  Dressing Change Due 08/20/16 08/13/2016  8:50 PM       Aldona Lento L 08/13/2016, 9:08 PM

## 2016-08-13 NOTE — Progress Notes (Signed)
Patient has had 4 moderate size dark red bloody stools since 1900. Hospitalist notified.

## 2016-08-13 NOTE — Brief Op Note (Signed)
08/08/2016 - 08/13/2016  2:51 PM  PATIENT:  [name]  83 y.o. female  PRE-OPERATIVE DIAGNOSIS:  Rectal bleeding  POST-OPERATIVE DIAGNOSIS:  unable to locate source of bleed  PROCEDURE:  Procedure(s): FLEXIBLE SIGMOIDOSCOPY (N/A)  SURGEON:  Surgeon(s) and Role:    * Keyandre Pileggi, MD - Primary  Findings /recommendations -------------------------------------- - Flexible sigmoidoscopy showed fresh blood as well as clotted blood up to 60 cm. Was not able to identify bleeding source. - D/W  with interventional radiology Dr. Kathlene Cote for possible angiogram and embolization. - Transfuse as needed to keep hemoglobin more than 8. - Call patient's daughter. She is at [place] office and would like [place] to call back later on. - Keep nothing by mouth for now. -  Consider colonoscopy if negative angiogram in setting of ongoing bleeding  - GI will follow.     Otis Brace MD, FACP 08/13/2016, 2:55 PM  Pager [phone]  If no answer or after 5 PM call [phone]

## 2016-08-13 NOTE — Sedation Documentation (Signed)
Patient is resting comfortably. 

## 2016-08-13 NOTE — H&P (Signed)
Chief Complaint: GI bleed  Referring Physician(s): D. Elgergawy Parag Brahmbhatt  Supervising Physician: Aletta Edouard  Patient Status: [facility] - In-pt  History of Present Illness: [name] is a 83 y.o. female who presentedwith fever and epigastric pain.   She was found to have cholangitis, status post ERCP and stent placement 08/09/2016.  Her stent migrated to proximal colon.  She is now having active lower GI bleeding.  She started to have dark bloody bowel movement 08/11/2016. She had been transfused total of 4 units PRBC.  Repeat endoscopy 6/10 showed no evidence of active GI bleed, as well with negative bleeding scan, but she continues to have active bleeding.  She underwent Flex sig today with fresh blood and clotted blood seen up to 60 cm but GI was unable to identify a bleeding source.  We are asked to perform angiography of the mesenteric vessels to hopefully identify bleeding and embolize.  Past Medical History:  Diagnosis Date  . Chest pain    a. 07/2014 Neg MV, nl EF.  Marland Kitchen Chronic interstitial cystitis   . Chronic low back pain   . CKD (chronic kidney disease), stage III   . Diabetes mellitus without complication ([place])   . GERD (gastroesophageal reflux disease)   . Hyperlipidemia   . Hypertension   . Macular degeneration   . Macular degeneration of both eyes 08/03/2016   see pinks spots  . Microhematuria   . Osteopenia   . Pernicious anemia   . Postlaminectomy syndrome   . PVC (premature ventricular contraction)   . Right carpal tunnel syndrome     Past Surgical History:  Procedure Laterality Date  . BACK SURGERY    . CHOLECYSTECTOMY N/A 12/21/2015   Procedure: LAPAROSCOPIC CHOLECYSTECTOMY WITH INTRAOPERATIVE CHOLANGIOGRAM;  Surgeon: Alphonsa Overall, MD;  Location: [facility];  Service: General;  Laterality: N/A;  . EUS N/A 02/23/2015   Procedure: ESOPHAGEAL ENDOSCOPIC ULTRASOUND (EUS) RADIAL;  Surgeon: Arta Silence, MD;  Location: [facility];  Service:  Endoscopy;  Laterality: N/A;  . JOINT REPLACEMENT Left    hip and knee    Allergies: Codeine  Medications: Prior to Admission medications   Medication Sig Start Date End Date Taking? Authorizing Provider  acetaminophen (TYLENOL) 500 MG tablet Take 500 mg by mouth every 6 (six) hours as needed for mild pain.   Yes [provider]  amLODipine (NORVASC) 5 MG tablet Take 1 tablet (5 mg total) by mouth daily. 11/21/14  Yes Rai, Ripudeep K, MD  aspirin EC 81 MG tablet Take 81 mg by mouth daily.   Yes [provider]  atorvastatin (LIPITOR) 10 MG tablet Take 5 mg by mouth daily.   Yes [provider]  cholecalciferol (VITAMIN D) 1000 UNITS tablet Take 2,000 Units by mouth daily.    Yes [provider]  conjugated estrogens (PREMARIN) vaginal cream Place 1 Applicatorful vaginally daily as needed (burning).    Yes [provider]  cyanocobalamin (,VITAMIN B-12,) 1000 MCG/ML injection Inject 1,000 mcg into the muscle every 30 (thirty) days.   Yes [provider]  FLUoxetine (PROZAC) 20 MG tablet Take 20 mg by mouth daily.   Yes [provider]  furosemide (LASIX) 20 MG tablet Take 2 tablets (40 mg total) by mouth daily. Patient taking differently: Take 20 mg by mouth daily.  02/08/15  Yes Domenic Polite, MD  glimepiride (AMARYL) 1 MG tablet Take 2 tablets (2 mg total) by mouth every morning. 01/17/15  Yes Charlynne Cousins,  MD  lisinopril (PRINIVIL,ZESTRIL) 20 MG tablet Take 1 tablet (20 mg total) by mouth every evening. 07/22/14  Yes Mikhail, [place], DO  lithium carbonate 150 MG capsule Take 150 mg by mouth daily.   Yes [provider]  metoprolol (LOPRESSOR) 50 MG tablet Take 1 tablet (50 mg total) by mouth 2 (two) times daily. 07/22/14  Yes Mikhail, Maryann, DO  mirtazapine (REMERON) 30 MG tablet Take 30 mg by mouth at bedtime.    Yes [provider]  nitroGLYCERIN (NITROSTAT) 0.4 MG SL tablet Place 1 tablet (0.4 mg  total) under the tongue every 5 (five) minutes as needed for chest pain. 07/22/14  Yes Mikhail, Velta Addison, DO  omeprazole (PRILOSEC) 40 MG capsule Take 40 mg by mouth every evening.    Yes [provider]  oxybutynin (DITROPAN) 5 MG tablet Take 5 mg by mouth 2 (two) times daily.   Yes [provider]  traMADol (ULTRAM) 50 MG tablet Take 1 tablet (50 mg total) by mouth 2 (two) times daily as needed (pain). Patient taking differently: Take 50 mg by mouth 2 (two) times daily.  07/22/14  Yes Mikhail, Velta Addison, DO  zolpidem (AMBIEN) 5 MG tablet Take 2.5 mg by mouth at bedtime.    Yes [provider]     Family History  Problem Relation Age of Onset  . Arthritis Mother   . Heart attack Father     Social History   Social History  . Marital status: Married    Spouse name: N/A  . Number of children: N/A  . Years of education: N/A   Occupational History  . retired    Social History Main Topics  . Smoking status: Never Smoker  . Smokeless tobacco: Never Used  . Alcohol use No  . Drug use: No  . Sexual activity: Not Asked   Other Topics Concern  . None   Social History Narrative  . None    Review of Systems  Unable to perform ROS: Other  Recent anesthesia/sedation medication  Vital Signs: BP (!) 123/45   Pulse (!) 121   Temp 98.1 F (36.7 C) (Oral)   Resp (!) 24   Ht 5\' 1"  (1.549 m)   Wt 149 lb 7.6 oz (67.8 kg)   SpO2 98%   BMI 28.24 kg/m   Physical Exam  Constitutional: She appears well-developed and well-nourished.  HENT:  Head: Normocephalic and atraumatic.  Eyes: EOM are normal.  Neck: Normal range of motion.  Cardiovascular: Normal rate, regular rhythm and normal heart sounds.   Good femoral pulses bilaterally  Pulmonary/Chest: Effort normal and breath sounds normal. No respiratory distress.  Abdominal: Soft. She exhibits no distension. There is no tenderness.  Neurological:  Awake and alert despite recent sedation medications.  Skin:  Skin is warm and dry. There is pallor.  Vitals reviewed.   Mallampati Score:  MD Evaluation Airway: WNL Heart: WNL Abdomen: WNL Chest/ Lungs: WNL Other Pertinent Findings: sleepy, easily arousable ASA  Classification: 3 Mallampati/Airway Score: One  Imaging: Ct Abdomen Pelvis Wo Contrast  Result Date: 08/11/2016 CLINICAL DATA:  Worsening right-sided pain and low-grade fever in a patient that is status post cholecystectomy. Chronic kidney disease. Diabetes. Endoscopic ultrasound 02/23/2015. ERCP 2 days ago. Cholecystectomy 12/21/2015. EXAM: CT ABDOMEN AND PELVIS WITHOUT CONTRAST TECHNIQUE: Multidetector CT imaging of the abdomen and pelvis was performed following the standard protocol without IV contrast. COMPARISON:  Ultrasound of 08/09/2016. CT of 08/08/2016. ERCP of 08/09/2016. FINDINGS: Lower chest: Clear lung bases. Cardiomegaly  with small bilateral pleural effusions. Hepatobiliary: No focal liver lesion. Cholecystectomy. Moderate intrahepatic biliary duct dilatation persists. Pneumobilia is new. Interval placement of a distal common duct stent, which terminates in the descending duodenum Pancreas: Pancreatic atrophy. No duct dilatation. Cystic lesion in the pancreatic head measures 2.5 cm on image 26/ series 3 and is similar to on the prior. No evidence of acute pancreatitis. Spleen: Normal in size, without focal abnormality. Adrenals/Urinary Tract: Normal adrenal glands. Mild renal cortical thinning bilaterally. low-density left renal lesions are likely cysts. No hydronephrosis. Degraded evaluation of the pelvis, secondary to beam hardening artifact from left hip arthroplasty. Foley catheter in the urinary bladder. Stomach/Bowel: Normal stomach, without wall thickening. Scattered colonic diverticula. Normal terminal ileum. Duodenal diverticulum. Otherwise normal small bowel. Vascular/Lymphatic: Aortic and branch vessel atherosclerosis. No retroperitoneal or retrocrural adenopathy.  Reproductive: Uterus and adnexa not well evaluated. Other: Trace pelvic fluid, including on image 72/series 3. No free intraperitoneal air. No abdominal ascites. Musculoskeletal: Left hip arthroplasty. Osteopenia. Convex left lumbar spine curvature. Advanced lumbosacral spondylosis. Minimal superior endplate compression deformity at L1 is unchanged. IMPRESSION: 1. Interval placement of a distal common duct stent. Persistent moderate intrahepatic biliary duct dilatation with new pneumobilia. Common duct not well evaluated. 2. No explanation for abdominal pain. 3. Degraded evaluation of the pelvis, secondary to beam hardening artifact from left hip arthroplasty. Trace cul-de-sac fluid is identified. 4.  Aortic atherosclerosis. 5. Small bilateral pleural effusions. 6. Similar size of a pancreatic head cystic lesion. Electronically Signed   By: Abigail Miyamoto M.D.   On: 08/11/2016 15:34   Ct Abdomen Pelvis Wo Contrast  Result Date: 08/09/2016 CLINICAL DATA:  Dyspnea and nausea starting today. Elevated lactic acid levels. EXAM: CT ABDOMEN AND PELVIS WITHOUT CONTRAST TECHNIQUE: Multidetector CT imaging of the abdomen and pelvis was performed following the standard protocol without IV contrast. COMPARISON:  01/03/2016 CT and chest CT performed earlier on the same day FINDINGS: Lower chest: Borderline cardiomegaly with bibasilar atelectasis and/or scarring. No pericardial effusion. There is coronary arteriosclerosis. Hepatobiliary: Status post cholecystectomy with chronic stable dilatation of the intra and extrahepatic biliary ducts without calculus noted. The common bile duct is slightly more dilated on current exam up to 2.1 cm at the pancreatic head versus 1.7 cm in 2017. No mural thickening of the bowel ducts is identified. Lack of IV contrast limits assessment for mucosal enhancement. Patient reportedly received IV contrast earlier for a chest CT. On that exam, no definite mucosal enhancement is noted. Pancreas:  Stable 2.7 cm cystic lesion in the proximal body of the pancreas. No ductal dilatation is noted. Spleen: Normal in size without focal abnormality. Adrenals/Urinary Tract: Normal bilateral adrenal glands. Small left renal cysts are again visualized. Symmetric pyelograms noted bilaterally status post prior CT administration for the chest. No obstructive uropathy. Contrast opacified urine distended bladder. Stomach/Bowel: All the stomach and small intestine are normal. There is a moderate amount of fecal retention throughout large bowel with circular muscle hypertrophy and extensive sigmoid diverticulosis noted similar in appearance to prior. The appendix is not visualized but no pericecal inflammation is noted. Vascular/Lymphatic: Diffuse atherosclerosis of the abdominal aorta and branch vessels without aneurysm. No retroperitoneal or pelvic sidewall adenopathy. No inguinal lymphadenopathy. Reproductive: Uterus and bilateral adnexa are unremarkable. Other: No abdominal wall hernia or abnormality. No abdominopelvic ascites. Musculoskeletal: Thoracolumbar spondylosis with multilevel degenerative disc disease along the lumbar spine. Osteopenia is noted. No acute fracture nor bone destruction. IMPRESSION: 1. Coronary arteriosclerosis and aortic atherosclerosis. 2. Extensive sigmoid diverticulosis  without acute diverticulitis. 3. Status post cholecystectomy with chronic intrahepatic and extrahepatic ductal dilatation without mural thickening or enhancement seen on recent same day CT with IV contrast. No conclusive findings of cholangitis. 4. Stable 2.7 cm pancreatic head cyst and small left-sided renal cysts. 5. Spondylosis of the lumbar spine. Electronically Signed   By: Ashley Royalty M.D.   On: 08/09/2016 00:04   Ct Angio Chest Pe W Or Wo Contrast  Result Date: 08/08/2016 CLINICAL DATA:  83 year old with acute onset of chest pain, shortness of breath and nausea and vomiting that began earlier today. EXAM: CT ANGIOGRAPHY  CHEST WITH CONTRAST TECHNIQUE: Multidetector CT imaging of the chest was performed using the standard protocol during bolus administration of intravenous contrast. Multiplanar CT image reconstructions and MIPs were obtained to evaluate the vascular anatomy. CONTRAST:  100 mL Isovue 370 IV. COMPARISON:  Unenhanced CT chest 01/13/2015. FINDINGS: Beam hardening streak artifact is present as the patient was unable to raise the arms over the head. Respiratory motion blurred many of the images. Cardiovascular: Contrast opacification of the pulmonary arteries is very good. No filling defects within either main pulmonary artery or their segmental branches in either lung to suggest pulmonary embolism. Heart moderately enlarged with left ventricular predominance. Severe LAD and right coronary artery atherosclerosis. No pericardial effusion. Severe atherosclerosis involving the thoracic and upper abdominal aorta without evidence of aneurysm or dissection. Gas is present within the right subclavian vein and the lower right internal jugular vein, possibly related to an intravenous injection in the right upper extremity. Mediastinum/Nodes: Scattered normal sized mediastinal lymph nodes. No pathologically enlarged mediastinal, hilar or axillary lymph nodes. No mediastinal masses. Normal-appearing esophagus. Visualized thyroid gland unremarkable. Lungs/Pleura: Low lung volumes. Scattered areas of localized hyperlucency in both lungs indicating focal air trapping. Scarring in the lingula and in the lower lobes. No confluent airspace consolidation. No significant pulmonary parenchymal nodules or masses, as there is a stable subpleural nodule adjacent to the right major fissure (series 7, image 45), indicating a subpleural lymph node. No pleural effusions. Subpleural fibrosis is present bilaterally, most prominent in the lingula. Central airways patent. Upper Abdomen: Small hiatal hernia. Prior cholecystectomy which accounts for the  dilated proximal common bile duct. Scarring and a simple cyst is present in the upper pole of the visualized left kidney. Visualized upper abdomen otherwise unremarkable for the early phase of enhancement. Musculoskeletal: Lower cervical degenerative disc disease and spondylosis. Osseous demineralization. No acute abnormalities. Review of the MIP images confirms the above findings. IMPRESSION: 1. No evidence of pulmonary embolism. 2. Moderate cardiomegaly. LAD and right coronary artery atherosclerosis. 3. Severe atherosclerosis involving the thoracic and upper abdominal aorta without aneurysm. 4. Low lung volumes. Scattered areas of localized air trapping in both lungs as can be seen in patients with asthma or bronchitis. Chronic lung changes as detailed above No acute cardiopulmonary disease otherwise. 5. Small hiatal hernia. Electronically Signed   By: Evangeline Dakin M.D.   On: 08/08/2016 20:47   Nm Gi Blood Loss  Result Date: 08/12/2016 CLINICAL DATA:  Gastrointestinal bleeding EXAM: NUCLEAR MEDICINE GASTROINTESTINAL BLEEDING SCAN TECHNIQUE: Sequential abdominal images were obtained following intravenous administration of Tc-34m labeled red blood cells. Images were obtained over a 2 hour time span. RADIOPHARMACEUTICALS:  25.4 mCi Tc-82m in-vitro labeled red cells. COMPARISON:  CT abdomen and pelvis August 11, 2016 FINDINGS: Images were obtained over a 2 hour time span. During that time interval, there is no abnormal radiotracer uptake to suggest a focus of gastrointestinal bleeding.  No abnormality appreciable. IMPRESSION: No abnormality appreciable. In particular, no demonstrable site of active gastroesophageal bleeding is demonstrated on this study. Electronically Signed   By: Lowella Grip III M.D.   On: 08/12/2016 13:33   Dg Chest Portable 1 View  Result Date: 08/08/2016 CLINICAL DATA:  Dyspnea and EXAM: PORTABLE CHEST 1 VIEW COMPARISON:  03/14/2016 FINDINGS: The heart size and mediastinal contours  are within normal limits. Aortic atherosclerosis is noted. No pneumonic consolidation, effusion or pneumothorax. Chronic subpleural interstitial prominence may reflect areas of fibrosis, scarring and/or chronic interstitial lung disease. Osteoarthritis of both glenohumeral joints with spurring and joint space narrowing. Slight remodeling along the undersurface of the distal acromion and clavicle on the left. This can be seen with chronic rotator cuff tear or rheumatoid arthritis. IMPRESSION: 1. Aortic atherosclerosis. 2. Mild stable interstitial prominence which may reflect subpleural fibrosis, scarring or sequela of chronic interstitial disease. 3. No acute pneumonic consolidation or CHF. Electronically Signed   By: Ashley Royalty M.D.   On: 08/08/2016 19:52   Dg Ercp  Result Date: 08/09/2016 CLINICAL DATA:  ERCP EXAM: ERCP TECHNIQUE: Multiple spot images obtained with the fluoroscopic device and submitted for interpretation post-procedure. COMPARISON:  CT abdomen pelvis - 08/08/2016 FINDINGS: 3 spot intraoperative fluoroscopic images of the right upper abdominal quadrant during ERCP are provided for review Initial image demonstrates an ERCP probe overlying the right upper abdominal quadrant. Post cholecystectomy. There is selective cannulation opacification of the common bile duct which appears markedly dilated compatible with the findings seen on preceding abdominal CT. There is no definitive opacification of the pancreatic or residual cystic ducts. There is no significant opacification of the intrahepatic biliary system. No definitive filling defects are seen with the opacified portions of the common bile duct to suggest the presence of choledocholithiasis. Completion image demonstrates placement of a internal biliary stent overlying the distal aspect of the common bile duct. IMPRESSION: ERCP with biliary stent placement as above. These images were submitted for radiologic interpretation only. Please see the  procedural report for the amount of contrast and the fluoroscopy time utilized. Electronically Signed   By: Sandi Mariscal M.D.   On: 08/09/2016 14:36   Dg Abd 2 Views  Result Date: 08/13/2016 CLINICAL DATA:  83 year old female with displaced biliary stent. Subsequent encounter. EXAM: ABDOMEN - 2 VIEW COMPARISON:  08/12/2016 plain film exam.  08/11/2016 CT. FINDINGS: Biliary stent has shifted position slightly now located within the right lower abdomen/ right upper pelvis. It is possible this is in the right colon. No findings of bowel obstruction or free intraperitoneal air. Gas within the biliary system felt to be related to stent placement. Degenerative changes lumbar spine convex left. Post left hip replacement. Post cholecystectomy. IMPRESSION: Biliary stent has shifted position slightly now located within the right lower abdomen/ right upper pelvis. It is possible this is in the right colon. Electronically Signed   By: Genia Del M.D.   On: 08/13/2016 13:29   Dg Abd 2 Views  Result Date: 08/12/2016 CLINICAL DATA:  Displacement of biliary stent. EXAM: ABDOMEN - 2 VIEW COMPARISON:  CT of the abdomen and pelvis 08/11/2016 FINDINGS: The biliary stent is noted in the right-sided the abdomen, projecting over the ascending colon on all images. Levoconvex curvature is present in the lumbar spine. Atherosclerotic changes are noted. A partial left hip replacement is noted. IMPRESSION: The biliary stent projects over the right side of the abdomen, potentially within the ascending colon. Electronically Signed   By: Harrell Gave  Mattern M.D.   On: 08/12/2016 21:30   US Abdomen Limited Ruq  Result Date: 08/10/2016 CLINICAL DATA:  Cholangitis. ERCP earlier this day with stent placement. EXAM: ULTRASOUND ABDOMEN LIMITED RIGHT UPPER QUADRANT COMPARISON:  Abdominal CT yesterday. FINDINGS: Gallbladder: Surgically absent. Common bile duct: Diameter: 17 mm at the porta hepatis. 2.2 x 2.5 cm distally. Biliary stent is  visualized. Liver: No focal lesion identified. Linear echogenic shadowing is likely pneumobilia related to recent ERCP. Normal directional flow in the main portal vein. 2.6 cm cyst in the pancreatic head, as seen on CT. IMPRESSION: 1. Biliary dilatation with biliary stent visualized in the common bile duct. The degree of biliary dilatation appears similar to CT yesterday. 2. Linear echogenic shadowing structures in the liver likely pneumobilia. Electronically Signed   By: Jeb Levering M.D.   On: 08/10/2016 02:27    Labs:  CBC:  Recent Labs  08/11/16 1504 08/12/16 0150 08/12/16 1833 08/13/16 0031 08/13/16 0725 08/13/16 1059  WBC 6.7 6.4 8.2 7.4  --   --   HGB 8.7* 8.7* 9.9* 7.0* 7.8* 9.9*  HCT 27.5* 26.7* 30.3* 21.2* 23.1* 28.9*  PLT 134* 114* 112* 102*  --   --     COAGS:  Recent Labs  12/18/15 1758 03/14/16 0035 08/08/16 2323  INR 1.14 1.23 1.13  APTT 29 33  --     BMP:  Recent Labs  08/10/16 0429 08/11/16 0758 08/12/16 0150 08/13/16 0031  NA 140 144 141 141  K 3.6 4.0 4.3 4.7  CL 106 110 112* 114*  CO2 25 25 24 24   GLUCOSE 140* 152* 150* 123*  BUN 27* 19 18 19   CALCIUM 8.3* 8.9 7.8* 7.5*  CREATININE 1.40* 1.13* 1.04* 1.02*  GFRNONAA 33* 43* 48* 49*  GFRAA 39* 50* 55* 56*    LIVER FUNCTION TESTS:  Recent Labs  08/10/16 0429 08/11/16 0758 08/12/16 0150 08/13/16 0031  BILITOT 3.9* 3.2* 2.0* 1.1  AST 392* 120* 41 23  ALT 775* 476* 230* 133*  ALKPHOS 254* 236* 134* 92  PROT 5.4* 5.8* 4.1* 3.7*  ALBUMIN 2.6* 2.6* 1.9* 1.8*    TUMOR MARKERS: No results for input(s): AFPTM, CEA, CA199, CHROMGRNA in the last 8760 hours.  Assessment and Plan:  Active LGIB  Will proceed with Mesenteric Angiography today by Dr. Kathlene Cote with possible embolzation.  Risks and Benefits discussed with the patient including, but not limited to bleeding, infection, vascular injury or contrast induced renal failure.  All of the patient's questions were answered,  patient is agreeable to proceed. Consent signed and in chart.  Thank you for this interesting consult.  I greatly enjoyed meeting [name] and look forward to participating in their care.  A copy of this report was sent to the requesting provider on this date.  Electronically Signed: Murrell Redden, PA-C 08/13/2016, 3:36 PM   I spent a total of 40 Minutes in face to face in clinical consultation, greater than 50% of which was counseling/coordinating care for agram with embolization

## 2016-08-13 NOTE — Progress Notes (Signed)
[facility] Gastroenterology Progress Note  JENEL GIERKE 83 y.o. Apr 01, 1929  CC:  Cholangitis/ GI bleed   Subjective: Patient continues to have blood in bowel movement.  now having bright red blood per rectum. Drop in hemoglobin noted despite of blood transfusion. Abdominal pain is resolved now. Denied any vomiting.  ROS : Negative for chest pain or shortness of breath.   Objective: Vital signs in last 24 hours: Vitals:   08/13/16 0951 08/13/16 1000  BP: (!) 158/64 (!) 139/49  Pulse: 75 93  Resp: 13 (!) 26  Temp: 97.9 F (36.6 C)     Physical Exam:  General:  Alert, cooperative, no distress, appears stated age  Head:  Normocephalic, without obvious abnormality, atraumatic  Eyes:  , EOM's intact,Mild scleral icterus   Lungs:   Clear to auscultation bilaterally, respirations unlabored. Anterior exam only   Heart:  Regular rate and rhythm, S1, S2 normal  Abdomen:   Right upper quadrant and right lower quadrant tenderness to palpation, abdomen is somewhat distended. Bowel sounds present. No peritoneal sign.   Extremities: Extremities normal, atraumatic, no  edema       Lab Results:  Recent Labs  08/12/16 0150 08/13/16 0031  NA 141 141  K 4.3 4.7  CL 112* 114*  CO2 24 24  GLUCOSE 150* 123*  BUN 18 19  CREATININE 1.04* 1.02*  CALCIUM 7.8* 7.5*    Recent Labs  08/12/16 0150 08/13/16 0031  AST 41 23  ALT 230* 133*  ALKPHOS 134* 92  BILITOT 2.0* 1.1  PROT 4.1* 3.7*  ALBUMIN 1.9* 1.8*    Recent Labs  08/12/16 1833 08/13/16 0031 08/13/16 0725  WBC 8.2 7.4  --   HGB 9.9* 7.0* 7.8*  HCT 30.3* 21.2* 23.1*  MCV 86.6 86.5  --   PLT 112* 102*  --    No results for input(s): LABPROT, INR in the last 72 hours.    Assessment/Plan: - GI bleed with Initial dark stools but now right red blood per rectum. Negative EGD for active bleeding yesterday.  Ascending cholangitis. Status post ERCP with sphincterotomy,  removal of pus and plastic stent placement. LFTs trending  down.  - Displacement of the previously placed biliary stent. X-ray showed stent possibly in ascending colon - Gram-negative bacteremia - Sepsis. - Acute kidney injury. Improving  Recommendations ------------------------- - GI bleeding scan negative yesterday. - flexible sigmoidoscopy today for further evaluation. Risk benefits alternatives discussed with the patient. If flexible sigmoidoscopy negative and continues to have bleeding, she may need colonoscopy or repeat bleeding scan. - Attempted to call Daughter.  No answer. - Transfuse as needed. Monitor H&H. GI will follow   Otis Brace MD, [place] 08/13/2016, 10:45 AM  Pager [phone]  If no answer or after 5 PM call [phone]

## 2016-08-13 NOTE — Sedation Documentation (Signed)
IR tech holding pressure to R groin 

## 2016-08-13 NOTE — Progress Notes (Signed)
New Hgb 7.9. Hospitalist notified.

## 2016-08-13 NOTE — Procedures (Signed)
Interventional Radiology Procedure Note  Procedure: Selective SMA arteriography  Complications: None  Estimated Blood Loss: < 10 mL  Findings:  SMA arteriography pre- and post-nitroglycerin administration shows no active arterial bleed.  No AVM or angiodysplasia. IMA could not be located/catheterized and likely occluded.  Lumbar artery injected but does not supply IMA territory.  Venetia Night. Kathlene Cote, M.D Pager:  [phone]

## 2016-08-13 NOTE — Progress Notes (Signed)
Hgb 7.0 called to Hospitalist. 1 unit PRBC ordered.

## 2016-08-13 NOTE — Op Note (Signed)
[facility] Patient Name: [name] Procedure Date : 08/13/2016 MRN: [id number] Attending MD: Otis Brace , MD Date of Birth: [birth year] CSN: [id number] Age: 83 Admit Type: Inpatient Procedure:                Flexible Sigmoidoscopy Indications:              Rectal hemorrhage Providers:                Otis Brace, MD, Vista Lawman, RN, Corliss Parish, Technician Referring MD:              Medicines:                Fentanyl 37.5 micrograms IV, Midazolam 2 mg IV Complications:            No immediate complications. Estimated Blood Loss:     Estimated blood loss: none. Procedure:                Pre-Anesthesia Assessment:                           - Prior to the procedure, a History and Physical                            was performed, and patient medications and                            allergies were reviewed. The patient's tolerance of                            previous anesthesia was also reviewed. The risks                            and benefits of the procedure and the sedation                            options and risks were discussed with the patient.                            All questions were answered, and informed consent                            was obtained. Prior Anticoagulants: The patient has                            taken no previous anticoagulant or antiplatelet                            agents. ASA Grade Assessment: III - A patient with                            severe systemic disease. After reviewing the risks  and benefits, the patient was deemed in                            satisfactory condition to undergo the procedure.                           After obtaining informed consent, the scope was                            passed under direct vision. The [id number] ([id number])                            scope was introduced through the anus and advanced                             to the the sigmoid colon. The flexible                            sigmoidoscopy was technically difficult and complex                            due to the patient's discomfort during the                            procedure. The flexible sigmoidoscopy was                            technically difficult and complex due to poor                            endoscopic visualization. The quality of the bowel                            preparation was fair but had significant amount of                            blood clots limiting visualization. Scope In: Scope Out: Findings:      The perianal and digital rectal examinations were normal.      Red and Clotted blood was found in the rectum, in the recto-sigmoid       colon and in the sigmoid colon. lavage was performed but I was not able       to identify bleeding source.His bleeding seems to be more proximal      Multiple large-mouthed diverticula were found in the sigmoid colon.      Non-bleeding internal hemorrhoids were found during retroflexion. The       hemorrhoids were Grade I (internal hemorrhoids that do not prolapse). Impression:               - Blood in the rectum, in the recto-sigmoid colon                            and in the sigmoid colon.                           -  Diverticulosis in the sigmoid colon.                           - Non-bleeding internal hemorrhoids.                           - No specimens collected. Recommendation:           - Return patient to hospital ward for ongoing care.                           - patient appears to have active bleeding but I was                            unable to identify a bleeding source. I have                            discussed with interventional radiology Dr.                            Kathlene Cote for possible angiogram and embolization.                           - Transfuse as needed to keep hemoglobin more than                            8.                            - Procedure Code(s):        --- Professional ---                           [phone], Sigmoidoscopy, flexible; diagnostic,                            including collection of specimen(s) by brushing or                            washing, when performed (separate procedure) Diagnosis Code(s):        --- Professional ---                           K62.5, Hemorrhage of anus and rectum                           K92.2, Gastrointestinal hemorrhage, unspecified                           K64.0, First degree hemorrhoids                           K57.30, Diverticulosis of large intestine without                            perforation or abscess without bleeding CPT copyright 2016 [employer]. All rights reserved. The codes documented  in this report are preliminary and upon coder review may  be revised to meet current compliance requirements. Otis Brace, MD Otis Brace, MD 08/13/2016 2:50:27 PM Number of Addenda: 0

## 2016-08-13 NOTE — Sedation Documentation (Signed)
Pressure hold complete- bedrest until 9pm- 4 hrs per MD

## 2016-08-13 NOTE — Progress Notes (Signed)
PROGRESS NOTE    [name]  LGX:[id number] DOB: [birth year] DOA: 08/08/2016 PCP: Lavone Orn, MD   Brief Narrative:  83 y.o.WF PMHx Bipolar disorder, HTN, DM, CKD stage III ( baseline Cr 1.1), CHF, and chronic pain (interstitial cystitis and chronic back pain) Who presented with fever and epigastric pain. She was found to have cholangitis, status post ERCP 08/09/2016, patient started to have dark bloody bowel movement 08/11/2016, will she has been transfused total of 4 units PRBC, repeat endoscopy 6/10 with no evidence of active GI bleed, as well with negative bleeding scan, she continues to have active bleeding, with plan for flexible sigmoidoscopy mortise cup he today.  Subjective: Patient remains with significant dark bloody bowel movements, acquired 2 units transfusion overnight, reports abdominal pain significantly improved, denies any chest pain, shortness of breath or dizziness .  Assessment & Plan:   Principal Problem:   Sepsis ([facility]) Active Problems:   CKD (chronic kidney disease), stage III   Chronic diastolic congestive heart failure (HCC)   Essential hypertension, malignant   Diabetes mellitus with complication (HCC)   Bipolar disorder (HCC)   Recurrent cholangitis   Elevated troponin   Sepsis due to Klebsiella bacteremia/Cholangitis - Resolving S/P stent placement CBD - Per GI Patient will need an EGD in 1 month, as an outpatient for removal of CBD stent which will be scheduled. - Repeat CT abdomen and pelvis 6/9 with no acute finding to explain her complaints of right upper quadrant abdominal pain. -  LFTs trending down  Transaminitis - Secondary to above, they are trending down.  Klebsiella pneumoniae Bacteremia - Related with IV Zosyn, culture showing pansensitive, so transitioned to Rocephin.  GI bleed - Patient with dark stools, went for EGD today, no evidence of post-sphincterotomy bleeding. - Bleeding scan with no evidence of GI bleed - She continues to  have significant dark bloody bowel movement, unclear etiology, most likely lower GI given no evidence of EGD bleed, a plan to go for flexible sigmoidoscopy today.  Acute blood loss anemia - This is secondary to GI bleed, but no evidence of upper GI bleeding on endoscopy, as well no evidence of active bleed and bleeding scan, this is most likely related to upper GI bleed post-sphincterotomy which has really stopped. - She was transfused another 2 units PRBC overnight, which makes it up to total 6 units PRBC.  Elevated troponin -Most likely secondary to demand ischemia, trending down continue to monitor. However given patient's. History of moderate LVH and pulmonary hypertension , repeat 2-D echo with EF 55-60%, with no regional wall motion abnormalities, and grade 1 diastolic dysfunction No results for input(s): TROPONINI in the last 72 hours.  CKD stage III (Baseline creatinine 1.1) Lab Results  Component Value Date   CREATININE 1.02 (H) 08/13/2016   CREATININE 1.04 (H) 08/12/2016   CREATININE 1.13 (H) 08/11/2016    Chronic diastolic congestive heart failure - Appears to be euvolemic, monitor closely as an IV fluids   Essential HTN - Currently blood pressure acceptable, amlodipine has been stopped secondary to GI bleed, continue with metoprolol, and when necessary hydralazine .  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     Consultants:  [facility]  Procedures/Significant Events:  6/7 ERCP - diffusely dilated CBD (2cm) w/ no visible filling defect - sphincterotomy performed leading to drainage of pus - CBD stent placed  plastic CBD stent 5 cm 7 French  6/10 repeat EGD with  no evidence of active GI bleed   Cultures 6/6 Hepatitis A/B/C negative 6/6 blood positive Enterobacteriaceae species/Klebsiella pneumoniae 6/6 urine positive multiple species 6/7 MRSA by PCR positive      Antimicrobials: Anti-infectives    Start     Stop   08/09/16 2000  vancomycin (VANCOCIN) 500 mg in sodium chloride 0.9 % 100 mL IVPB  Status:  Discontinued     08/09/16 0125   08/09/16 0400  piperacillin-tazobactam (ZOSYN) IVPB 3.375 g         08/08/16 1930  piperacillin-tazobactam (ZOSYN) IVPB 3.375 g     08/08/16 2104   08/08/16 1930  vancomycin (VANCOCIN) IVPB 1000 mg/200 mL premix     08/08/16 2116       Devices    LINES / TUBES:  None    Continuous Infusions: . sodium chloride 75 mL/hr at 08/13/16 0524  . sodium chloride    . sodium chloride    . sodium chloride    . sodium chloride    . cefTRIAXone (ROCEPHIN)  IV Stopped (08/13/16 1030)     Objective: Vitals:   08/13/16 0815 08/13/16 0900 08/13/16 0951 08/13/16 1000  BP: (!) 134/45 (!) 145/47 (!) 158/64 (!) 139/49  Pulse: 88 86 75 93  Resp: (!) 21 14 13  (!) 26  Temp: 98.8 F (37.1 C)  97.9 F (36.6 C)   TempSrc: Oral  Oral   SpO2: 100% 100% 100% 100%  Weight:      Height:        Intake/Output Summary (Last 24 hours) at 08/13/16 1202 Last data filed at 08/13/16 1000  Gross per 24 hour  Intake           2667.5 ml  Output              150 ml  Net           2517.5 ml   Filed Weights   08/08/16 1921 08/09/16 0500 08/10/16 1205  Weight: 59.9 kg (132 lb) 65.1 kg (143 lb 8.3 oz) 67.8 kg (149 lb 7.6 oz)    Examination:  Awake, alert 3, in no apparent distress  Supple neck, no JVD  Good air entry bilaterally , clear to auscultation, no wheezing Regular rate and rhythm, no rubs murmurs gallops Abdomen soft, right upper quadrant tenderness significantly subsided, no rebound, no guarding, bowel sounds present Extremities with no edema, clubbing or cyanosis   Data Reviewed: Care during the  described time interval was provided by me .  I have reviewed this patient's available data, including medical history, events of note, physical examination, and all test results as part of my evaluation. I have personally reviewed and interpreted all radiology studies.  CBC:  Recent Labs Lab 08/08/16 1845  08/10/16 0429 08/11/16 1504 08/12/16 0150 08/12/16 1833 08/13/16 0031 08/13/16 0725 08/13/16 1059  WBC 9.5  < > 8.2 6.7 6.4 8.2 7.4  --   --   NEUTROABS 7.9*  --   --   --   --   --   --   --   --   HGB 13.0  < > 10.2* 8.7* 8.7* 9.9* 7.0* 7.8* 9.9*  HCT 40.3  < > 31.2* 27.5* 26.7* 30.3* 21.2* 23.1* 28.9*  MCV  90.6  < > 90.7 90.8 86.7 86.6 86.5  --   --   PLT 214  < > 120* 134* 114* 112* 102*  --   --   < > = values in this interval not displayed. Basic Metabolic Panel:  Recent Labs Lab 08/09/16 0441 08/10/16 0429 08/11/16 0758 08/12/16 0150 08/13/16 0031  NA 139 140 144 141 141  K 3.4* 3.6 4.0 4.3 4.7  CL 102 106 110 112* 114*  CO2 24 25 25 24 24   GLUCOSE 248* 140* 152* 150* 123*  BUN 29* 27* 19 18 19   CREATININE 1.44* 1.40* 1.13* 1.04* 1.02*  CALCIUM 8.8* 8.3* 8.9 7.8* 7.5*   GFR: Estimated Creatinine Clearance: 35.5 mL/min (A) (by C-G formula based on SCr of 1.02 mg/dL (H)). Liver Function Tests:  Recent Labs Lab 08/09/16 0441 08/10/16 0429 08/11/16 0758 08/12/16 0150 08/13/16 0031  AST 1,391* 392* 120* 41 23  ALT 1,270* 775* 476* 230* 133*  ALKPHOS 331* 254* 236* 134* 92  BILITOT 3.7* 3.9* 3.2* 2.0* 1.1  PROT 5.5* 5.4* 5.8* 4.1* 3.7*  ALBUMIN 2.9* 2.6* 2.6* 1.9* 1.8*    Recent Labs Lab 08/08/16 1845  LIPASE 42   No results for input(s): AMMONIA in the last 168 hours. Coagulation Profile:  Recent Labs Lab 08/08/16 2323  INR 1.13   Cardiac Enzymes:  Recent Labs Lab 08/08/16 2323 08/09/16 0441 08/09/16 1522 08/10/16 0429  CKTOTAL 21*  --   --   --   TROPONINI 0.41* 1.03* 0.49* 0.33*   BNP (last 3 results) No results for input(s):  PROBNP in the last 8760 hours. HbA1C:  Recent Labs  08/10/16 2141  HGBA1C 6.2*   CBG:  Recent Labs Lab 08/12/16 1927 08/12/16 2259 08/13/16 0317 08/13/16 0819 08/13/16 1156  GLUCAP 109* 115* 111* 111* 118*   Lipid Profile:  Recent Labs  08/10/16 2141  CHOL 166  HDL 14*  LDLCALC 91  TRIG 305*  CHOLHDL 11.9   Thyroid Function Tests: No results for input(s): TSH, T4TOTAL, FREET4, T3FREE, THYROIDAB in the last 72 hours. Anemia Panel: No results for input(s): VITAMINB12, FOLATE, FERRITIN, TIBC, IRON, RETICCTPCT in the last 72 hours. Urine analysis:    Component Value Date/Time   COLORURINE YELLOW 08/08/2016 2004   APPEARANCEUR HAZY (A) 08/08/2016 2004   LABSPEC 1.014 08/08/2016 2004   PHURINE 5.0 08/08/2016 2004   GLUCOSEU 150 (A) 08/08/2016 2004   HGBUR NEGATIVE 08/08/2016 2004   BILIRUBINUR NEGATIVE 08/08/2016 2004   [place] NEGATIVE 08/08/2016 2004   PROTEINUR 30 (A) 08/08/2016 2004   UROBILINOGEN 0.2 11/20/2014 1405   NITRITE NEGATIVE 08/08/2016 2004   LEUKOCYTESUR NEGATIVE 08/08/2016 2004   Sepsis Labs: @LABRCNTIP (procalcitonin:4,lacticidven:4)  ) Recent Results (from the past 240 hour(s))  Blood Culture (routine x 2)     Status: Abnormal   Collection Time: 08/08/16  7:20 PM  Result Value Ref Range Status   Specimen Description BLOOD RIGHT WRIST  Final   Special Requests   Final    BOTTLES DRAWN AEROBIC AND ANAEROBIC Blood Culture adequate volume   Culture  Setup Time   Final    GRAM NEGATIVE RODS IN BOTH AEROBIC AND ANAEROBIC BOTTLES CRITICAL VALUE NOTED.  VALUE IS CONSISTENT WITH PREVIOUSLY REPORTED AND CALLED VALUE.    Culture (A)  Final    KLEBSIELLA PNEUMONIAE SUSCEPTIBILITIES PERFORMED ON PREVIOUS CULTURE WITHIN THE LAST 5 DAYS.    Report Status 08/11/2016 FINAL  Final  Blood Culture (routine x 2)     Status:  Abnormal   Collection Time: 08/08/16  8:01 PM  Result Value Ref Range Status   Specimen Description BLOOD LEFT ANTECUBITAL   Final   Special Requests IN PEDIATRIC BOTTLE Blood Culture adequate volume  Final   Culture  Setup Time   Final    GRAM NEGATIVE RODS IN PEDIATRIC BOTTLE Organism ID to follow CRITICAL RESULT CALLED TO, READ BACK BY AND VERIFIED WITH: Hughie Closs Pharm.D. 10:15 08/09/16 ([email or web address])    Culture KLEBSIELLA PNEUMONIAE (A)  Final   Report Status 08/11/2016 FINAL  Final   Organism ID, Bacteria KLEBSIELLA PNEUMONIAE  Final      Susceptibility   Klebsiella pneumoniae - MIC*    AMPICILLIN >=32 RESISTANT Resistant     CEFAZOLIN <=4 SENSITIVE Sensitive     CEFEPIME <=1 SENSITIVE Sensitive     CEFTAZIDIME <=1 SENSITIVE Sensitive     CEFTRIAXONE <=1 SENSITIVE Sensitive     CIPROFLOXACIN <=0.25 SENSITIVE Sensitive     GENTAMICIN <=1 SENSITIVE Sensitive     IMIPENEM <=0.25 SENSITIVE Sensitive     TRIMETH/SULFA <=20 SENSITIVE Sensitive     AMPICILLIN/SULBACTAM 4 SENSITIVE Sensitive     PIP/TAZO <=4 SENSITIVE Sensitive     Extended ESBL NEGATIVE Sensitive     * KLEBSIELLA PNEUMONIAE  Blood Culture ID Panel (Reflexed)     Status: Abnormal   Collection Time: 08/08/16  8:01 PM  Result Value Ref Range Status   Enterococcus species NOT DETECTED NOT DETECTED Final   Listeria monocytogenes NOT DETECTED NOT DETECTED Final   Staphylococcus species NOT DETECTED NOT DETECTED Final   Staphylococcus aureus NOT DETECTED NOT DETECTED Final   Streptococcus species NOT DETECTED NOT DETECTED Final   Streptococcus agalactiae NOT DETECTED NOT DETECTED Final   Streptococcus pneumoniae NOT DETECTED NOT DETECTED Final   Streptococcus pyogenes NOT DETECTED NOT DETECTED Final   Acinetobacter baumannii NOT DETECTED NOT DETECTED Final   Enterobacteriaceae species DETECTED (A) NOT DETECTED Final    Comment: Enterobacteriaceae represent a large family of gram-negative bacteria, not a single organism. CRITICAL RESULT CALLED TO, READ BACK BY AND VERIFIED WITH: Hughie Closs Pharm.D. 10:15 08/09/16 ([email or web address])    Enterobacter cloacae  complex NOT DETECTED NOT DETECTED Final   Escherichia coli NOT DETECTED NOT DETECTED Final   Klebsiella oxytoca NOT DETECTED NOT DETECTED Final   Klebsiella pneumoniae DETECTED (A) NOT DETECTED Final    Comment: CRITICAL RESULT CALLED TO, READ BACK BY AND VERIFIED WITH: Hughie Closs Pharm.D. 10:15 08/09/16 ([email or web address])    Proteus species NOT DETECTED NOT DETECTED Final   Serratia marcescens NOT DETECTED NOT DETECTED Final   Carbapenem resistance NOT DETECTED NOT DETECTED Final   Haemophilus influenzae NOT DETECTED NOT DETECTED Final   Neisseria meningitidis NOT DETECTED NOT DETECTED Final   Pseudomonas aeruginosa NOT DETECTED NOT DETECTED Final   Candida albicans NOT DETECTED NOT DETECTED Final   Candida glabrata NOT DETECTED NOT DETECTED Final   Candida krusei NOT DETECTED NOT DETECTED Final   Candida parapsilosis NOT DETECTED NOT DETECTED Final   Candida tropicalis NOT DETECTED NOT DETECTED Final  Urine culture     Status: Abnormal   Collection Time: 08/08/16  8:04 PM  Result Value Ref Range Status   Specimen Description URINE, RANDOM  Final   Special Requests NONE  Final   Culture MULTIPLE SPECIES PRESENT, SUGGEST RECOLLECTION (A)  Final   Report Status 08/10/2016 FINAL  Final  MRSA PCR Screening     Status: Abnormal   Collection Time: 08/09/16  4:07 AM  Result  Value Ref Range Status   MRSA by PCR POSITIVE (A) NEGATIVE Final    Comment:        The GeneXpert MRSA Assay (FDA approved for NASAL specimens only), is one component of a comprehensive MRSA colonization surveillance program. It is not intended to diagnose MRSA infection nor to guide or monitor treatment for MRSA infections. RESULT CALLED TO, READ BACK BY AND VERIFIED WITH: [facility] RN AT 1194 08/09/16 BY A.DAVIS          Radiology Studies: Ct Abdomen Pelvis Wo Contrast  Result Date: 08/11/2016 CLINICAL DATA:  Worsening right-sided pain and low-grade fever in a patient that is status post cholecystectomy. Chronic  kidney disease. Diabetes. Endoscopic ultrasound 02/23/2015. ERCP 2 days ago. Cholecystectomy 12/21/2015. EXAM: CT ABDOMEN AND PELVIS WITHOUT CONTRAST TECHNIQUE: Multidetector CT imaging of the abdomen and pelvis was performed following the standard protocol without IV contrast. COMPARISON:  Ultrasound of 08/09/2016. CT of 08/08/2016. ERCP of 08/09/2016. FINDINGS: Lower chest: Clear lung bases. Cardiomegaly with small bilateral pleural effusions. Hepatobiliary: No focal liver lesion. Cholecystectomy. Moderate intrahepatic biliary duct dilatation persists. Pneumobilia is new. Interval placement of a distal common duct stent, which terminates in the descending duodenum Pancreas: Pancreatic atrophy. No duct dilatation. Cystic lesion in the pancreatic head measures 2.5 cm on image 26/ series 3 and is similar to on the prior. No evidence of acute pancreatitis. Spleen: Normal in size, without focal abnormality. Adrenals/Urinary Tract: Normal adrenal glands. Mild renal cortical thinning bilaterally. low-density left renal lesions are likely cysts. No hydronephrosis. Degraded evaluation of the pelvis, secondary to beam hardening artifact from left hip arthroplasty. Foley catheter in the urinary bladder. Stomach/Bowel: Normal stomach, without wall thickening. Scattered colonic diverticula. Normal terminal ileum. Duodenal diverticulum. Otherwise normal small bowel. Vascular/Lymphatic: Aortic and branch vessel atherosclerosis. No retroperitoneal or retrocrural adenopathy. Reproductive: Uterus and adnexa not well evaluated. Other: Trace pelvic fluid, including on image 72/series 3. No free intraperitoneal air. No abdominal ascites. Musculoskeletal: Left hip arthroplasty. Osteopenia. Convex left lumbar spine curvature. Advanced lumbosacral spondylosis. Minimal superior endplate compression deformity at L1 is unchanged. IMPRESSION: 1. Interval placement of a distal common duct stent. Persistent moderate intrahepatic biliary duct  dilatation with new pneumobilia. Common duct not well evaluated. 2. No explanation for abdominal pain. 3. Degraded evaluation of the pelvis, secondary to beam hardening artifact from left hip arthroplasty. Trace cul-de-sac fluid is identified. 4.  Aortic atherosclerosis. 5. Small bilateral pleural effusions. 6. Similar size of a pancreatic head cystic lesion. Electronically Signed   By: Abigail Miyamoto M.D.   On: 08/11/2016 15:34   Nm Gi Blood Loss  Result Date: 08/12/2016 CLINICAL DATA:  Gastrointestinal bleeding EXAM: NUCLEAR MEDICINE GASTROINTESTINAL BLEEDING SCAN TECHNIQUE: Sequential abdominal images were obtained following intravenous administration of Tc-75m labeled red blood cells. Images were obtained over a 2 hour time span. RADIOPHARMACEUTICALS:  25.4 mCi Tc-28m in-vitro labeled red cells. COMPARISON:  CT abdomen and pelvis August 11, 2016 FINDINGS: Images were obtained over a 2 hour time span. During that time interval, there is no abnormal radiotracer uptake to suggest a focus of gastrointestinal bleeding. No abnormality appreciable. IMPRESSION: No abnormality appreciable. In particular, no demonstrable site of active gastroesophageal bleeding is demonstrated on this study. Electronically Signed   By: Lowella Grip III M.D.   On: 08/12/2016 13:33   Dg Abd 2 Views  Result Date: 08/12/2016 CLINICAL DATA:  Displacement of biliary stent. EXAM: ABDOMEN - 2 VIEW COMPARISON:  CT of the abdomen and pelvis 08/11/2016 FINDINGS: The biliary  stent is noted in the right-sided the abdomen, projecting over the ascending colon on all images. Levoconvex curvature is present in the lumbar spine. Atherosclerotic changes are noted. A partial left hip replacement is noted. IMPRESSION: The biliary stent projects over the right side of the abdomen, potentially within the ascending colon. Electronically Signed   By: San Morelle M.D.   On: 08/12/2016 21:30        Scheduled Meds: . atorvastatin  5 mg Oral  Daily  . Chlorhexidine Gluconate Cloth  6 each Topical Q0600  . FLUoxetine  20 mg Oral Daily  . insulin aspart  0-9 Units Subcutaneous Q4H  . lithium carbonate  150 mg Oral Daily  . metoprolol tartrate  12.5 mg Oral BID  . mirtazapine  30 mg Oral QHS  . mupirocin ointment  1 application Nasal BID  . oxybutynin  5 mg Oral BID  . pantoprazole (PROTONIX) IV  40 mg Intravenous Q12H  . polyethylene glycol  17 g Oral Daily  . Vilazodone HCl  20 mg Oral Daily  . zolpidem  2.5 mg Oral QHS   Continuous Infusions: . sodium chloride 75 mL/hr at 08/13/16 0524  . sodium chloride    . sodium chloride    . sodium chloride    . sodium chloride    . cefTRIAXone (ROCEPHIN)  IV Stopped (08/13/16 1030)     LOS: 4 days     Kiley Torrence, MD [facility] Pager [phone]  If 7PM-7AM, please contact night-coverage [email or web address] Password [facility] 08/13/2016, 12:02 PM

## 2016-08-13 NOTE — Sedation Documentation (Signed)
MD placing exoseal to R groin- 5French

## 2016-08-13 NOTE — Progress Notes (Signed)
AM meds not given d/t need for procedure and pt now being NPO. Oncoming RN made aware.

## 2016-08-13 NOTE — Sedation Documentation (Signed)
MD in room- spoke with pt about procedure, no questions or concerns; pt alert and oriented x4. Understands communication

## 2016-08-13 NOTE — Progress Notes (Signed)
PT Cancellation Note  Patient Details Name: [name] MRN: [id number] DOB: September 03, 1929   Cancelled Treatment:    Reason Eval/Treat Not Completed: Patient at procedure or test/unavailable.  Will return tomorrow for PT treatment if appropriate.  Also, PT evaluation completed 08/11/16 with recommendation for HHPT.  Will continue to assess for any changes in POC.   Despina Pole 08/13/2016, 2:47 PM Carita Pian. Sanjuana Kava, [place] Pager [phone]

## 2016-08-14 ENCOUNTER — Inpatient Hospital Stay (HOSPITAL_COMMUNITY): Payer: PPO

## 2016-08-14 ENCOUNTER — Encounter (HOSPITAL_COMMUNITY): Payer: Self-pay | Admitting: Gastroenterology

## 2016-08-14 LAB — GLUCOSE, CAPILLARY
GLUCOSE-CAPILLARY: 120 mg/dL — AB (ref 65–99)
GLUCOSE-CAPILLARY: 133 mg/dL — AB (ref 65–99)
GLUCOSE-CAPILLARY: 140 mg/dL — AB (ref 65–99)
GLUCOSE-CAPILLARY: 91 mg/dL (ref 65–99)
Glucose-Capillary: 160 mg/dL — ABNORMAL HIGH (ref 65–99)
Glucose-Capillary: 96 mg/dL (ref 65–99)

## 2016-08-14 LAB — BASIC METABOLIC PANEL
ANION GAP: 8 (ref 5–15)
BUN: 20 mg/dL (ref 6–20)
CHLORIDE: 119 mmol/L — AB (ref 101–111)
CO2: 16 mmol/L — AB (ref 22–32)
CREATININE: 1.06 mg/dL — AB (ref 0.44–1.00)
Calcium: 8.1 mg/dL — ABNORMAL LOW (ref 8.9–10.3)
GFR calc non Af Amer: 47 mL/min — ABNORMAL LOW (ref >60)
GFR, EST AFRICAN AMERICAN: 54 mL/min — AB (ref >60)
Glucose, Bld: 120 mg/dL — ABNORMAL HIGH (ref 65–99)
POTASSIUM: 5.4 mmol/L — AB (ref 3.5–5.1)
Sodium: 143 mmol/L (ref 135–145)

## 2016-08-14 LAB — CBC
HEMATOCRIT: 25.1 % — AB (ref 36.0–46.0)
HEMOGLOBIN: 8.4 g/dL — AB (ref 12.0–15.0)
MCH: 29.9 pg (ref 26.0–34.0)
MCHC: 33.5 g/dL (ref 30.0–36.0)
MCV: 89.3 fL (ref 78.0–100.0)
Platelets: 112 10*3/uL — ABNORMAL LOW (ref 150–400)
RBC: 2.81 MIL/uL — AB (ref 3.87–5.11)
RDW: 15.4 % (ref 11.5–15.5)
WBC: 9.2 10*3/uL (ref 4.0–10.5)

## 2016-08-14 LAB — HEMOGLOBIN AND HEMATOCRIT, BLOOD
HCT: 19.6 % — ABNORMAL LOW (ref 36.0–46.0)
HCT: 25.3 % — ABNORMAL LOW (ref 36.0–46.0)
HEMATOCRIT: 28.5 % — AB (ref 36.0–46.0)
HEMOGLOBIN: 9.5 g/dL — AB (ref 12.0–15.0)
HEMOGLOBIN: 8.7 g/dL — AB (ref 12.0–15.0)
Hemoglobin: 6.6 g/dL — CL (ref 12.0–15.0)

## 2016-08-14 LAB — PREPARE RBC (CROSSMATCH)

## 2016-08-14 MED ORDER — PEG 3350-KCL-NA BICARB-NACL 420 G PO SOLR
4000.0000 mL | Freq: Once | ORAL | Status: AC
  Administered 2016-08-14: 4000 mL via ORAL
  Filled 2016-08-14: qty 4000

## 2016-08-14 MED ORDER — SODIUM CHLORIDE 0.9 % IV SOLN
Freq: Once | INTRAVENOUS | Status: AC
  Administered 2016-08-14: 07:00:00 via INTRAVENOUS

## 2016-08-14 MED ORDER — SODIUM CHLORIDE 0.9 % IV SOLN
Freq: Once | INTRAVENOUS | Status: AC
  Administered 2016-08-14: 02:00:00 via INTRAVENOUS

## 2016-08-14 NOTE — Progress Notes (Signed)
Physical Therapy Treatment Patient Details Name: [name] MRN: [id number] DOB: [birth year] Today's Date: 08/14/2016    History of Present Illness 83 y.o.female admitted 08/08/16 with fever and epigastric pain.  Patient with bloody stools. S/P ERCP with CBD stent.  EGD negative for blood 6/11. Plan for colonscopy 6/13.  PMHx Bipolar disorder, HTN, DM, CKD stage III ( baseline Cr 1.1), CHF, and chronic pain (interstitial cystitis and chronic back pain)    PT Comments    Patient progressing well towards PT goals. Started prep for colonoscopy today. Tolerated gait training with Min guard assist for safety. Demonstrates weakness from being stuck in bed. Encouraged OOB to chair as much as tolerated and to use BSC. Eager to mobilize. Improved ambulation distance today. VSS. Will continue to follow and progress we tolerated.       Follow Up Recommendations  Home health PT;Supervision/Assistance - 24 hour     Equipment Recommendations  None recommended by PT    Recommendations for Other Services       Precautions / Restrictions Precautions Precautions: Fall Restrictions Weight Bearing Restrictions: No    Mobility  Bed Mobility Overal bed mobility: Needs Assistance Bed Mobility: Rolling;Sidelying to Sit Rolling: Min assist Sidelying to sit: Min assist;HOB elevated       General bed mobility comments: Assist to elevate trunk to get to EOB. Increased time. Use of rail.  Transfers Overall transfer level: Needs assistance Equipment used: Rolling walker (2 wheeled) Transfers: Sit to/from Stand Sit to Stand: Min guard;Min assist         General transfer comment: Min A initially to power to standing progressing to Min guard assist with cues for hand placement/technique. Stood from [employer], from [facility] x1, from chair x2.  Ambulation/Gait Ambulation/Gait assistance: Min guard Ambulation Distance (Feet): 60 Feet (+ 10') Assistive device: Rolling walker (2 wheeled) Gait  Pattern/deviations: Step-through pattern;Decreased stride length;Shuffle;Trunk flexed Gait velocity: decreased   General Gait Details: VC's for upright posture. Slow, steady gait. 2/4 DOE. Fatigues.   Stairs            Wheelchair Mobility    Modified Rankin (Stroke Patients Only)       Balance Overall balance assessment: Needs assistance Sitting-balance support: Feet supported;No upper extremity supported Sitting balance-Leahy Scale: Fair     Standing balance support: During functional activity;Bilateral upper extremity supported Standing balance-Leahy Scale: Poor Standing balance comment: Reliant on BUEs for support in standing.                             Cognition Arousal/Alertness: Awake/alert Behavior During Therapy: WFL for tasks assessed/performed Overall Cognitive Status: Within Functional Limits for tasks assessed                                        Exercises      General Comments General comments (skin integrity, edema, etc.): Daughter present during session. VSS.      Pertinent Vitals/Pain Pain Assessment: No/denies pain    Home Living                      Prior Function            PT Goals (current goals can now be found in the care plan section) Progress towards PT goals: Progressing toward goals    Frequency  Min 3X/week      PT Plan Current plan remains appropriate    Co-evaluation              AM-PAC PT "6 Clicks" Daily Activity  Outcome Measure  Difficulty turning over in bed (including adjusting bedclothes, sheets and blankets)?: None Difficulty moving from lying on back to sitting on the side of the bed? : Total Difficulty sitting down on and standing up from a chair with arms (e.g., wheelchair, bedside commode, etc,.)?: A Little Help needed moving to and from a bed to chair (including a wheelchair)?: A Little Help needed walking in hospital room?: A Little Help needed  climbing 3-5 steps with a railing? : A Little 6 Click Score: 17    End of Session Equipment Utilized During Treatment: Gait belt Activity Tolerance: Patient tolerated treatment well Patient left: in chair;with call bell/phone within reach;with family/visitor present Nurse Communication: Mobility status PT Visit Diagnosis: Difficulty in walking, not elsewhere classified (R26.2);Muscle weakness (generalized) (M62.81)     Time: 8257-4935 PT Time Calculation (min) (ACUTE ONLY): 32 min  Charges:  $Gait Training: 8-22 mins $Therapeutic Activity: 8-22 mins                    G Codes:       Wray Kearns, PT, DPT [phone]     [name] A Elea Holtzclaw 08/14/2016, 11:55 AM

## 2016-08-14 NOTE — Progress Notes (Signed)
1800 Hgb result of 8.7 text paged to Dr. [facility].  Roselee Nova, RN

## 2016-08-14 NOTE — Progress Notes (Signed)
Patient had 2 episodes of large dark bloody stools overnight.

## 2016-08-14 NOTE — Progress Notes (Signed)
[facility] Gastroenterology Progress Note  [name] 83 y.o. 1929-03-09  CC:  Cholangitis/ GI bleed   Subjective: Patient continues to have maroon color bowel movements. Has received around 7 units of blood transfusion so far. Denied abdominal pain. Denied vomiting. Complaining of nausea.   ROS : Negative for chest pain or shortness of breath.   Objective: Vital signs in last 24 hours: Vitals:   08/14/16 0800 08/14/16 0816  BP: (!) 134/119 (!) 149/48  Pulse: 78 83  Resp: 19 18  Temp:  98.6 F (37 C)    Physical Exam:  General:  Alert, cooperative, no distress, appears stated age  Head:  Normocephalic, without obvious abnormality, atraumatic  Eyes:  , EOM's intact,Mild scleral icterus   Lungs:   Clear to auscultation bilaterally, respirations unlabored. Anterior exam only   Heart:  Regular rate and rhythm, S1, S2 normal  Abdomen:   Abdomen soft, nontender, nondistended Bowel sounds present. No peritoneal sign.   Extremities: Extremities normal, atraumatic, no  edema       Lab Results:  Recent Labs  08/13/16 0031 08/14/16 0727  NA 141 143  K 4.7 5.4*  CL 114* 119*  CO2 24 16*  GLUCOSE 123* 120*  BUN 19 20  CREATININE 1.02* 1.06*  CALCIUM 7.5* 8.1*    Recent Labs  08/12/16 0150 08/13/16 0031  AST 41 23  ALT 230* 133*  ALKPHOS 134* 92  BILITOT 2.0* 1.1  PROT 4.1* 3.7*  ALBUMIN 1.9* 1.8*    Recent Labs  08/13/16 1833 08/14/16 0033 08/14/16 0727  WBC 10.7*  --  9.2  NEUTROABS 6.1  --   --   HGB 7.9* 6.6* 8.4*  HCT 23.2* 19.6* 25.1*  MCV 86.2  --  89.3  PLT 121*  --  PENDING   No results for input(s): LABPROT, INR in the last 72 hours.    Assessment/Plan: - GI bleed with Initial dark stools but now right red blood per rectum. Negative EGD for active bleeding yesterday.  Ascending cholangitis. Status post ERCP with sphincterotomy,  removal of pus and plastic stent placement. LFTs trending down.  - Displacement of the previously placed biliary  stent. X-ray showed stent possibly in ascending colon - Gram-negative bacteremia - Sepsis. - Acute kidney injury. Improving  Recommendations ------------------------- - GI bleeding scan negative 08/12/2016. Flexible sigmoidoscopy yesterday showed clotted and fresh blood but not able to identify bleeding source. Negative angiogram yesterday. - Case discussed with the anesthesia charge nurse. patient will need minimum 4 hours of nothing by mouth after prep before they can provide her sedation for colonoscopy. Discussed with the patient as well as patient's daughter over the phone. Plan for colonoscopy tomorrow. Clear liquid diet today. Nothing by mouth past midnight. Transfuse as needed. -  Risk benefits alternatives discussed with the patient and family - GI will follow   Otis Brace MD, [place] 08/14/2016, 9:11 AM  Pager [phone]  If no answer or after 5 PM call [phone]

## 2016-08-14 NOTE — Care Management Note (Addendum)
Case Management Note  Patient Details  Name: [name] MRN: [id number] Date of Birth: 1929/05/22  Subjective/Objective:                    Action/Plan:   PTA from home with husband.  Daughters at bedside.  Pt uses walker to walk in the home.  Per daughters pt has PCP and denied barriers to obtaining prescribed medications   Expected Discharge Date:                  Expected Discharge Plan:  [place]  In-House Referral:     Discharge planning Services  CM Consult  Post Acute Care Choice:    Choice offered to:  Patient  DME Arranged:    DME Agency:     HH Arranged:  PT [place]:  [place]  Status of Service:  In process, will continue to follow  If discussed at Long Length of Stay Meetings, dates discussed:    Additional Comments: 08/14/2016  Pt given choice for HH - chose [facility] - agency contacted and referral accepted.  Pt confirmed she stays with husband and uses walker to assist with ambulation.    Discussed in LOS 08/14/16 - pt remains appropriate for continued stay.  Pt had negative angiogram yesterday and therefore there is still no known source of bleeding even though Patient continues to have maroon color bowel movements. Has receivedd around 7 units of blood transfusion so far. Denied abdominal pain. Denied vomiting. Complaining of nausea.   CM will continue to follow for discharge needs  Maryclare Labrador, RN 08/14/2016, 3:23 PM

## 2016-08-14 NOTE — Progress Notes (Signed)
New Hgb of 6.6 called to hospitalist. 1 unit of PRBC ordered.

## 2016-08-14 NOTE — Progress Notes (Signed)
PROGRESS NOTE                                                                                                                                                                                                             Patient Demographics:    [name], is a 83 y.o. female, DOB - Feb 13, 1930, GHW:[id number]  Admit date - 08/08/2016   Admitting Physician Edwin Dada, MD  Outpatient Primary MD for the patient is Lavone Orn, MD  LOS - 5   Chief Complaint  Patient presents with  . Chest Pain       Brief Narrative   83 y.o.WF PMHx Bipolar disorder, HTN, DM, CKD stage III ( baseline Cr 1.1), CHF, and chronic pain (interstitial cystitis and chronic back pain) Who presentedwith fever and epigastric pain. She was found to have cholangitis, status post ERCP 08/09/2016, patient started to have dark bloody bowel movement 08/11/2016, will she has been transfused total of 4 units PRBC, repeat endoscopy 6/10 with no evidence of active GI bleed, as well with negative bleeding scan, she continues to have active bleeding, Flexible sigmoidoscopy with evidence of fresh and old blood, but could not identify source, went for angiogram by IR which could not identify source of bleed as well, continues requiring frequent transfusion with active GI bleed, plan for colonoscopy in a.m. .   Subjective:    [name] today has, No headache, No chest pain, For generalized weakness, right upper quadrant abdominal pain has subsided, and tenderness to have  bloody bowel movements.    Assessment  & Plan :    Principal Problem:   Sepsis ([place]) Active Problems:   CKD (chronic kidney disease), stage III   Chronic diastolic congestive heart failure (HCC)   Essential hypertension, malignant   Diabetes mellitus with complication (HCC)   Bipolar disorder (HCC)   Recurrent cholangitis   Elevated troponin   Sepsis due to Klebsiella bacteremia/Cholangitis - Resolving S/P stent  placement CBD - Per GI Patient will need an EGD in 1 month,as an outpatient for removal of CBD stent which will be scheduled. - Repeat CT abdomen and pelvis 6/9 with no acute finding to explain her complaints of right upper quadrant abdominal pain. -  LFTs trending down  Transaminitis - Secondary to above, they are trending down.  Klebsiella pneumoniae Bacteremia - Related  with IV Zosyn, culture showing pansensitive, so transitioned to Rocephin 6/11.  GI bleed - This is most likely related to lower GI bleed has no evidence of active bleed on EGD, and evidence of pressured blood on flexible sigmoidoscopy with no source could be identified. - Patient with dark stools, went for EGD 6/10, no evidence of post-sphincterotomy bleeding. - Bleeding scan with no evidence of GI bleed - Flex sigmoidoscopy 6/11, showed clotted and flesh blood, but unable to identify source . - Negative angiogram by IR yesterday  - Plan for colonoscopy in a.m. Marland Kitchen - Continue to monitor H&H closely and transfuse as needed, she did require multiple blood transfusions so far ,  Acute blood loss anemia - This is secondary to GI bleed, but no evidence of upper GI bleeding on endoscopy, as well no evidence of active bleed and bleeding scan, - Maglinte been dropped to 6.6 this a.m., has received 2 units PRBC since, with good response, most recent hemoglobin is 9.5, for total of 8 units has been transfused.  Elevated troponin -Most likely secondary to demand ischemia, trending down continue to monitor. However given patient's. History of moderate LVH and pulmonary hypertension , repeat 2-D echo with EF 55-60%, with no regional wall motion abnormalities, and grade 1 diastolic dysfunction No results for input(s): TROPONINI in the last 72 hours.  CKD stage III (Baseline creatinine 1.1) Recent Labs       Lab Results  Component Value Date   CREATININE 1.02 (H) 08/13/2016   CREATININE 1.04 (H) 08/12/2016   CREATININE  1.13 (H) 08/11/2016      Chronic diastolic congestive heart failure - Appears to be euvolemic, monitor closely as an IV fluids   Essential HTN - Currently blood pressure acceptable, amlodipine has been stopped secondary to GI bleed, continue with metoprolol, and when necessary hydralazine .  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 - Continue with insulin sliding scale, hemoglobin A1c is 6.2  Bipolar disorder -Lithium 150 mg daily -Remeron 30 mg daily  MRSA screen positive     Code Status : DO NOT RESUSCITATE  Family Communication  : None at bedside  Disposition Plan  : Pending further workup  Barriers For Discharge : Still with active GI bleeding  Consults  :  GI  Procedures  :  6/7 ERCP - diffusely dilated CBD (2cm) w/ no visible filling defect - sphincterotomy performed leading to drainage of pus - CBD stent placed plastic CBD stent 5 cm 7 French  6/10 repeat EGD with no evidence of active GI bleed 6/11 Mesenteric Angiography  by Dr. Kathlene Cote   DVT Prophylaxis  :  SCDs   Lab Results  Component Value Date   PLT 112 (L) 08/14/2016    Antibiotics  :    Anti-infectives    Start     Dose/Rate Route Frequency Ordered Stop   08/13/16 2200  piperacillin-tazobactam (ZOSYN) IVPB 3.375 g  Status:  Discontinued     3.375 g 12.5 mL/hr over 240 Minutes Intravenous Every 8 hours 08/12/16 2003 08/13/16 0705   08/13/16 0800  cefTRIAXone (ROCEPHIN) 2 g in dextrose 5 % 50 mL IVPB     2 g 100 mL/hr over 30 Minutes Intravenous Every 24 hours 08/13/16 0705     08/09/16 2000  vancomycin (VANCOCIN) 500 mg in sodium chloride 0.9 % 100 mL IVPB  Status:  Discontinued     500 mg 100 mL/hr over 60 Minutes Intravenous Every 24 hours 08/08/16 1958 08/09/16 0125  08/09/16 0400  piperacillin-tazobactam (ZOSYN) IVPB 3.375 g  Status:  Discontinued     3.375 g 12.5 mL/hr over 240 Minutes Intravenous Every 8 hours 08/08/16 1958 08/12/16 2003   08/08/16 1930   piperacillin-tazobactam (ZOSYN) IVPB 3.375 g     3.375 g 100 mL/hr over 30 Minutes Intravenous  Once 08/08/16 1920 08/08/16 2104   08/08/16 1930  vancomycin (VANCOCIN) IVPB 1000 mg/200 mL premix     1,000 mg 200 mL/hr over 60 Minutes Intravenous  Once 08/08/16 1920 08/08/16 2116        Objective:   Vitals:   08/14/16 0900 08/14/16 1000 08/14/16 1100 08/14/16 1256  BP: (!) 147/50 (!) 166/67 (!) 155/55 (!) 145/65  Pulse: 80 93 79 73  Resp: 13 18 19    Temp:   98.2 F (36.8 C) 98.7 F (37.1 C)  TempSrc:   Oral Oral  SpO2: 100% 97% 100% 100%  Weight:      Height:        Wt Readings from Last 3 Encounters:  08/10/16 67.8 kg (149 lb 7.6 oz)  03/13/16 61.4 kg (135 lb 4.8 oz)  01/05/16 64.6 kg (142 lb 6.7 oz)     Intake/Output Summary (Last 24 hours) at 08/14/16 1357 Last data filed at 08/14/16 1100  Gross per 24 hour  Intake          2096.25 ml  Output                0 ml  Net          2096.25 ml     Physical Exam  Awake Alert, Oriented X 3  Symmetrical Chest wall movement, Good air movement bilaterally, CTAB RRR,No Gallops,Rubs or new Murmurs, No Parasternal Heave +ve B.Sounds, Abd Soft, No further right upper quadrant tenderness. No rebound - guarding or rigidity. No Cyanosis, Clubbing or edema,     Data Review:    CBC  Recent Labs Lab 08/08/16 1845  08/12/16 0150 08/12/16 1833 08/13/16 0031  08/13/16 1059 08/13/16 1833 08/14/16 0033 08/14/16 0727 08/14/16 1316  WBC 9.5  < > 6.4 8.2 7.4  --   --  10.7*  --  9.2  --   HGB 13.0  < > 8.7* 9.9* 7.0*  < > 9.9* 7.9* 6.6* 8.4* 9.5*  HCT 40.3  < > 26.7* 30.3* 21.2*  < > 28.9* 23.2* 19.6* 25.1* 28.5*  PLT 214  < > 114* 112* 102*  --   --  121*  --  112*  --   MCV 90.6  < > 86.7 86.6 86.5  --   --  86.2  --  89.3  --   MCH 29.2  < > 28.2 28.3 28.6  --   --  29.4  --  29.9  --   MCHC 32.3  < > 32.6 32.7 33.0  --   --  34.1  --  33.5  --   RDW 14.1  < > 16.7* 16.6* 16.5*  --   --  15.0  --  15.4  --   LYMPHSABS  0.8  --   --   --   --   --   --  3.3  --   --   --   MONOABS 0.6  --   --   --   --   --   --  1.1*  --   --   --   EOSABS 0.0  --   --   --   --   --   --  0.1  --   --   --   BASOSABS 0.0  --   --   --   --   --   --  0.1  --   --   --   < > = values in this interval not displayed.  Chemistries   Recent Labs Lab 08/09/16 0441 08/10/16 0429 08/11/16 0758 08/12/16 0150 08/13/16 0031 08/14/16 0727  NA 139 140 144 141 141 143  K 3.4* 3.6 4.0 4.3 4.7 5.4*  CL 102 106 110 112* 114* 119*  CO2 24 25 25 24 24  16*  GLUCOSE 248* 140* 152* 150* 123* 120*  BUN 29* 27* 19 18 19 20   CREATININE 1.44* 1.40* 1.13* 1.04* 1.02* 1.06*  CALCIUM 8.8* 8.3* 8.9 7.8* 7.5* 8.1*  AST 1,391* 392* 120* 41 23  --   ALT 1,270* 775* 476* 230* 133*  --   ALKPHOS 331* 254* 236* 134* 92  --   BILITOT 3.7* 3.9* 3.2* 2.0* 1.1  --    ------------------------------------------------------------------------------------------------------------------ No results for input(s): CHOL, HDL, LDLCALC, TRIG, CHOLHDL, LDLDIRECT in the last 72 hours.  Lab Results  Component Value Date   HGBA1C 6.2 (H) 08/10/2016   ------------------------------------------------------------------------------------------------------------------ No results for input(s): TSH, T4TOTAL, T3FREE, THYROIDAB in the last 72 hours.  Invalid input(s): FREET3 ------------------------------------------------------------------------------------------------------------------ No results for input(s): VITAMINB12, FOLATE, FERRITIN, TIBC, IRON, RETICCTPCT in the last 72 hours.  Coagulation profile  Recent Labs Lab 08/08/16 2323  INR 1.13    No results for input(s): DDIMER in the last 72 hours.  Cardiac Enzymes  Recent Labs Lab 08/09/16 0441 08/09/16 1522 08/10/16 0429  TROPONINI 1.03* 0.49* 0.33*   ------------------------------------------------------------------------------------------------------------------    Component Value  Date/Time   BNP 35.2 08/08/2016 1845    Inpatient Medications  Scheduled Meds: . atorvastatin  5 mg Oral Daily  . FLUoxetine  20 mg Oral Daily  . insulin aspart  0-9 Units Subcutaneous Q4H  . lithium carbonate  150 mg Oral Daily  . metoprolol tartrate  12.5 mg Oral BID  . mirtazapine  30 mg Oral QHS  . oxybutynin  5 mg Oral BID  . pantoprazole (PROTONIX) IV  40 mg Intravenous Q12H  . polyethylene glycol  17 g Oral Daily  . sodium chloride flush  10-40 mL Intracatheter Q12H  . Vilazodone HCl  20 mg Oral Daily  . zolpidem  2.5 mg Oral QHS   Continuous Infusions: . sodium chloride 75 mL/hr at 08/14/16 0430  . sodium chloride    . sodium chloride    . sodium chloride    . cefTRIAXone (ROCEPHIN)  IV 2 g (08/14/16 1109)   PRN Meds:.furosemide, hydrALAZINE, ondansetron **OR** ondansetron (ZOFRAN) IV, oxyCODONE, sodium chloride flush  Micro Results Recent Results (from the past 240 hour(s))  Blood Culture (routine x 2)     Status: Abnormal   Collection Time: 08/08/16  7:20 PM  Result Value Ref Range Status   Specimen Description BLOOD RIGHT WRIST  Final   Special Requests   Final    BOTTLES DRAWN AEROBIC AND ANAEROBIC Blood Culture adequate volume   Culture  Setup Time   Final    GRAM NEGATIVE RODS IN BOTH AEROBIC AND ANAEROBIC BOTTLES CRITICAL VALUE NOTED.  VALUE IS CONSISTENT WITH PREVIOUSLY REPORTED AND CALLED VALUE.    Culture (A)  Final    KLEBSIELLA PNEUMONIAE SUSCEPTIBILITIES PERFORMED ON PREVIOUS CULTURE WITHIN THE LAST 5 DAYS.    Report Status 08/11/2016 FINAL  Final  Blood Culture (routine x  2)     Status: Abnormal   Collection Time: 08/08/16  8:01 PM  Result Value Ref Range Status   Specimen Description BLOOD LEFT ANTECUBITAL  Final   Special Requests IN PEDIATRIC BOTTLE Blood Culture adequate volume  Final   Culture  Setup Time   Final    GRAM NEGATIVE RODS IN PEDIATRIC BOTTLE Organism ID to follow CRITICAL RESULT CALLED TO, READ BACK BY AND VERIFIED  WITH: Hughie Closs Pharm.D. 10:15 08/09/16 ([email or web address])    Culture KLEBSIELLA PNEUMONIAE (A)  Final   Report Status 08/11/2016 FINAL  Final   Organism ID, Bacteria KLEBSIELLA PNEUMONIAE  Final      Susceptibility   Klebsiella pneumoniae - MIC*    AMPICILLIN >=32 RESISTANT Resistant     CEFAZOLIN <=4 SENSITIVE Sensitive     CEFEPIME <=1 SENSITIVE Sensitive     CEFTAZIDIME <=1 SENSITIVE Sensitive     CEFTRIAXONE <=1 SENSITIVE Sensitive     CIPROFLOXACIN <=0.25 SENSITIVE Sensitive     GENTAMICIN <=1 SENSITIVE Sensitive     IMIPENEM <=0.25 SENSITIVE Sensitive     TRIMETH/SULFA <=20 SENSITIVE Sensitive     AMPICILLIN/SULBACTAM 4 SENSITIVE Sensitive     PIP/TAZO <=4 SENSITIVE Sensitive     Extended ESBL NEGATIVE Sensitive     * KLEBSIELLA PNEUMONIAE  Blood Culture ID Panel (Reflexed)     Status: Abnormal   Collection Time: 08/08/16  8:01 PM  Result Value Ref Range Status   Enterococcus species NOT DETECTED NOT DETECTED Final   Listeria monocytogenes NOT DETECTED NOT DETECTED Final   Staphylococcus species NOT DETECTED NOT DETECTED Final   Staphylococcus aureus NOT DETECTED NOT DETECTED Final   Streptococcus species NOT DETECTED NOT DETECTED Final   Streptococcus agalactiae NOT DETECTED NOT DETECTED Final   Streptococcus pneumoniae NOT DETECTED NOT DETECTED Final   Streptococcus pyogenes NOT DETECTED NOT DETECTED Final   Acinetobacter baumannii NOT DETECTED NOT DETECTED Final   Enterobacteriaceae species DETECTED (A) NOT DETECTED Final    Comment: Enterobacteriaceae represent a large family of gram-negative bacteria, not a single organism. CRITICAL RESULT CALLED TO, READ BACK BY AND VERIFIED WITH: Hughie Closs Pharm.D. 10:15 08/09/16 ([email or web address])    Enterobacter cloacae complex NOT DETECTED NOT DETECTED Final   Escherichia coli NOT DETECTED NOT DETECTED Final   Klebsiella oxytoca NOT DETECTED NOT DETECTED Final   Klebsiella pneumoniae DETECTED (A) NOT DETECTED Final    Comment: CRITICAL RESULT  CALLED TO, READ BACK BY AND VERIFIED WITH: Hughie Closs Pharm.D. 10:15 08/09/16 ([email or web address])    Proteus species NOT DETECTED NOT DETECTED Final   Serratia marcescens NOT DETECTED NOT DETECTED Final   Carbapenem resistance NOT DETECTED NOT DETECTED Final   Haemophilus influenzae NOT DETECTED NOT DETECTED Final   Neisseria meningitidis NOT DETECTED NOT DETECTED Final   Pseudomonas aeruginosa NOT DETECTED NOT DETECTED Final   Candida albicans NOT DETECTED NOT DETECTED Final   Candida glabrata NOT DETECTED NOT DETECTED Final   Candida krusei NOT DETECTED NOT DETECTED Final   Candida parapsilosis NOT DETECTED NOT DETECTED Final   Candida tropicalis NOT DETECTED NOT DETECTED Final  Urine culture     Status: Abnormal   Collection Time: 08/08/16  8:04 PM  Result Value Ref Range Status   Specimen Description URINE, RANDOM  Final   Special Requests NONE  Final   Culture MULTIPLE SPECIES PRESENT, SUGGEST RECOLLECTION (A)  Final   Report Status 08/10/2016 FINAL  Final  MRSA PCR Screening     Status: Abnormal   Collection Time:  08/09/16  4:07 AM  Result Value Ref Range Status   MRSA by PCR POSITIVE (A) NEGATIVE Final    Comment:        The GeneXpert MRSA Assay (FDA approved for NASAL specimens only), is one component of a comprehensive MRSA colonization surveillance program. It is not intended to diagnose MRSA infection nor to guide or monitor treatment for MRSA infections. RESULT CALLED TO, READ BACK BY AND VERIFIED WITH: [facility] RN AT 4008 08/09/16 BY A.DAVIS     Radiology Reports Ct Abdomen Pelvis Wo Contrast  Result Date: 08/11/2016 CLINICAL DATA:  Worsening right-sided pain and low-grade fever in a patient that is status post cholecystectomy. Chronic kidney disease. Diabetes. Endoscopic ultrasound 02/23/2015. ERCP 2 days ago. Cholecystectomy 12/21/2015. EXAM: CT ABDOMEN AND PELVIS WITHOUT CONTRAST TECHNIQUE: Multidetector CT imaging of the abdomen and pelvis was performed following the  standard protocol without IV contrast. COMPARISON:  Ultrasound of 08/09/2016. CT of 08/08/2016. ERCP of 08/09/2016. FINDINGS: Lower chest: Clear lung bases. Cardiomegaly with small bilateral pleural effusions. Hepatobiliary: No focal liver lesion. Cholecystectomy. Moderate intrahepatic biliary duct dilatation persists. Pneumobilia is new. Interval placement of a distal common duct stent, which terminates in the descending duodenum Pancreas: Pancreatic atrophy. No duct dilatation. Cystic lesion in the pancreatic head measures 2.5 cm on image 26/ series 3 and is similar to on the prior. No evidence of acute pancreatitis. Spleen: Normal in size, without focal abnormality. Adrenals/Urinary Tract: Normal adrenal glands. Mild renal cortical thinning bilaterally. low-density left renal lesions are likely cysts. No hydronephrosis. Degraded evaluation of the pelvis, secondary to beam hardening artifact from left hip arthroplasty. Foley catheter in the urinary bladder. Stomach/Bowel: Normal stomach, without wall thickening. Scattered colonic diverticula. Normal terminal ileum. Duodenal diverticulum. Otherwise normal small bowel. Vascular/Lymphatic: Aortic and branch vessel atherosclerosis. No retroperitoneal or retrocrural adenopathy. Reproductive: Uterus and adnexa not well evaluated. Other: Trace pelvic fluid, including on image 72/series 3. No free intraperitoneal air. No abdominal ascites. Musculoskeletal: Left hip arthroplasty. Osteopenia. Convex left lumbar spine curvature. Advanced lumbosacral spondylosis. Minimal superior endplate compression deformity at L1 is unchanged. IMPRESSION: 1. Interval placement of a distal common duct stent. Persistent moderate intrahepatic biliary duct dilatation with new pneumobilia. Common duct not well evaluated. 2. No explanation for abdominal pain. 3. Degraded evaluation of the pelvis, secondary to beam hardening artifact from left hip arthroplasty. Trace cul-de-sac fluid is  identified. 4.  Aortic atherosclerosis. 5. Small bilateral pleural effusions. 6. Similar size of a pancreatic head cystic lesion. Electronically Signed   By: Abigail Miyamoto M.D.   On: 08/11/2016 15:34   Ct Abdomen Pelvis Wo Contrast  Result Date: 08/09/2016 CLINICAL DATA:  Dyspnea and nausea starting today. Elevated lactic acid levels. EXAM: CT ABDOMEN AND PELVIS WITHOUT CONTRAST TECHNIQUE: Multidetector CT imaging of the abdomen and pelvis was performed following the standard protocol without IV contrast. COMPARISON:  01/03/2016 CT and chest CT performed earlier on the same day FINDINGS: Lower chest: Borderline cardiomegaly with bibasilar atelectasis and/or scarring. No pericardial effusion. There is coronary arteriosclerosis. Hepatobiliary: Status post cholecystectomy with chronic stable dilatation of the intra and extrahepatic biliary ducts without calculus noted. The common bile duct is slightly more dilated on current exam up to 2.1 cm at the pancreatic head versus 1.7 cm in 2017. No mural thickening of the bowel ducts is identified. Lack of IV contrast limits assessment for mucosal enhancement. Patient reportedly received IV contrast earlier for a chest CT. On that exam, no definite mucosal enhancement is noted. Pancreas: Stable  2.7 cm cystic lesion in the proximal body of the pancreas. No ductal dilatation is noted. Spleen: Normal in size without focal abnormality. Adrenals/Urinary Tract: Normal bilateral adrenal glands. Small left renal cysts are again visualized. Symmetric pyelograms noted bilaterally status post prior CT administration for the chest. No obstructive uropathy. Contrast opacified urine distended bladder. Stomach/Bowel: All the stomach and small intestine are normal. There is a moderate amount of fecal retention throughout large bowel with circular muscle hypertrophy and extensive sigmoid diverticulosis noted similar in appearance to prior. The appendix is not visualized but no pericecal  inflammation is noted. Vascular/Lymphatic: Diffuse atherosclerosis of the abdominal aorta and branch vessels without aneurysm. No retroperitoneal or pelvic sidewall adenopathy. No inguinal lymphadenopathy. Reproductive: Uterus and bilateral adnexa are unremarkable. Other: No abdominal wall hernia or abnormality. No abdominopelvic ascites. Musculoskeletal: Thoracolumbar spondylosis with multilevel degenerative disc disease along the lumbar spine. Osteopenia is noted. No acute fracture nor bone destruction. IMPRESSION: 1. Coronary arteriosclerosis and aortic atherosclerosis. 2. Extensive sigmoid diverticulosis without acute diverticulitis. 3. Status post cholecystectomy with chronic intrahepatic and extrahepatic ductal dilatation without mural thickening or enhancement seen on recent same day CT with IV contrast. No conclusive findings of cholangitis. 4. Stable 2.7 cm pancreatic head cyst and small left-sided renal cysts. 5. Spondylosis of the lumbar spine. Electronically Signed   By: Ashley Royalty M.D.   On: 08/09/2016 00:04   Ct Angio Chest Pe W Or Wo Contrast  Result Date: 08/08/2016 CLINICAL DATA:  83 year old with acute onset of chest pain, shortness of breath and nausea and vomiting that began earlier today. EXAM: CT ANGIOGRAPHY CHEST WITH CONTRAST TECHNIQUE: Multidetector CT imaging of the chest was performed using the standard protocol during bolus administration of intravenous contrast. Multiplanar CT image reconstructions and MIPs were obtained to evaluate the vascular anatomy. CONTRAST:  100 mL Isovue 370 IV. COMPARISON:  Unenhanced CT chest 01/13/2015. FINDINGS: Beam hardening streak artifact is present as the patient was unable to raise the arms over the head. Respiratory motion blurred many of the images. Cardiovascular: Contrast opacification of the pulmonary arteries is very good. No filling defects within either main pulmonary artery or their segmental branches in either lung to suggest pulmonary  embolism. Heart moderately enlarged with left ventricular predominance. Severe LAD and right coronary artery atherosclerosis. No pericardial effusion. Severe atherosclerosis involving the thoracic and upper abdominal aorta without evidence of aneurysm or dissection. Gas is present within the right subclavian vein and the lower right internal jugular vein, possibly related to an intravenous injection in the right upper extremity. Mediastinum/Nodes: Scattered normal sized mediastinal lymph nodes. No pathologically enlarged mediastinal, hilar or axillary lymph nodes. No mediastinal masses. Normal-appearing esophagus. Visualized thyroid gland unremarkable. Lungs/Pleura: Low lung volumes. Scattered areas of localized hyperlucency in both lungs indicating focal air trapping. Scarring in the lingula and in the lower lobes. No confluent airspace consolidation. No significant pulmonary parenchymal nodules or masses, as there is a stable subpleural nodule adjacent to the right major fissure (series 7, image 45), indicating a subpleural lymph node. No pleural effusions. Subpleural fibrosis is present bilaterally, most prominent in the lingula. Central airways patent. Upper Abdomen: Small hiatal hernia. Prior cholecystectomy which accounts for the dilated proximal common bile duct. Scarring and a simple cyst is present in the upper pole of the visualized left kidney. Visualized upper abdomen otherwise unremarkable for the early phase of enhancement. Musculoskeletal: Lower cervical degenerative disc disease and spondylosis. Osseous demineralization. No acute abnormalities. Review of the MIP images confirms the above  findings. IMPRESSION: 1. No evidence of pulmonary embolism. 2. Moderate cardiomegaly. LAD and right coronary artery atherosclerosis. 3. Severe atherosclerosis involving the thoracic and upper abdominal aorta without aneurysm. 4. Low lung volumes. Scattered areas of localized air trapping in both lungs as can be seen  in patients with asthma or bronchitis. Chronic lung changes as detailed above No acute cardiopulmonary disease otherwise. 5. Small hiatal hernia. Electronically Signed   By: Evangeline Dakin M.D.   On: 08/08/2016 20:47   Nm Gi Blood Loss  Result Date: 08/12/2016 CLINICAL DATA:  Gastrointestinal bleeding EXAM: NUCLEAR MEDICINE GASTROINTESTINAL BLEEDING SCAN TECHNIQUE: Sequential abdominal images were obtained following intravenous administration of Tc-49m labeled red blood cells. Images were obtained over a 2 hour time span. RADIOPHARMACEUTICALS:  25.4 mCi Tc-25m in-vitro labeled red cells. COMPARISON:  CT abdomen and pelvis August 11, 2016 FINDINGS: Images were obtained over a 2 hour time span. During that time interval, there is no abnormal radiotracer uptake to suggest a focus of gastrointestinal bleeding. No abnormality appreciable. IMPRESSION: No abnormality appreciable. In particular, no demonstrable site of active gastroesophageal bleeding is demonstrated on this study. Electronically Signed   By: Lowella Grip III M.D.   On: 08/12/2016 13:33   Ir Angiogram Visceral Selective  Result Date: 08/14/2016 INDICATION: Lower GI bleed and migration of previously placed endoscopic biliary stent into proximal colon. Nuclear medicine bleeding scan yesterday did not show evidence of active bleeding and request has been made to perform arteriography due to persistent clinical bleeding. Colonoscopy demonstrates bright red blood in the colon without focal bleeding source demonstrated. EXAM: 1. ULTRASOUND GUIDANCE FOR VASCULAR ACCESS OF THE RIGHT COMMON FEMORAL ARTERY 2. SELECTIVE VISCERAL ARTERIOGRAPHY OF THE SUPERIOR MESENTERIC ARTERY 3. SELECTIVE ARTERIOGRAPHY OF LUMBAR ARTERY MEDICATIONS: 100 mcg intra-arterial nitroglycerin ANESTHESIA/SEDATION: Moderate (conscious) sedation was employed during this procedure. A total of Versed 1.5 mg and Fentanyl 25 mcg was administered intravenously. Moderate Sedation Time: 50  minutes. The patient's level of consciousness and vital signs were monitored continuously by radiology nursing throughout the procedure under my direct supervision. CONTRAST:  120 mL Isovue-300 FLUOROSCOPY TIME:  Fluoroscopy Time: 14 minutes 18 seconds. (171.6 mGy). COMPLICATIONS: None immediate. PROCEDURE: Informed consent was obtained from the patient's husband following explanation of the procedure, risks, benefits and alternatives. The patient's husband understands, agrees and consents for the procedure. All questions were addressed. A time out was performed prior to the initiation of the procedure. Maximal barrier sterile technique utilized including caps, mask, sterile gowns, sterile gloves, large sterile drape, hand hygiene, and Betadine prep. The right common femoral artery was localized by ultrasound and patency confirmed by ultrasound. Access of the artery was performed under direct ultrasound guidance with a micropuncture set and ultrasound image documentation performed. After access of the right common femoral artery, a 5 French sheath was advanced over a guidewire. A 5 French Cobra catheter was then advanced into the abdominal aorta. This was used to selectively catheterize the superior mesenteric artery. The catheter was advanced into the trunk of the superior mesenteric artery and initial selective arteriography performed. Additional selective arteriography was then performed of the entire SMA territory after intra-arterial administration of 100 mcg nitroglycerin. Attempt to find the origin of the inferior mesenteric artery was then performed with the Cobra catheter. The Cobra catheter was then exchanged for a 5 [place] Sos catheter and attempts made to find and catheterize the inferior mesenteric artery origin. Arteriography of the distal aorta was performed through the Sos catheter. Eventually, a vessel was catheterized  and selectively injected which corresponded to a hypertrophied lumbar artery. The  Sos catheter was then removed over a guidewire. Hemostasis was obtained at the femoral access site with the Cordis Exoseal device. FINDINGS: Initial arteriography of the superior mesenteric artery shows relative vasoconstriction with no evidence of arterial bleeding or other vascular abnormality. After administration of intra-arterial nitroglycerin, improved vasodilatation was present. Arteriography of the entire SMA supply demonstrates no evidence of active arterial bleeding, pseudoaneurysm, AV malformation or angiodysplasia. An endoscopic plastic biliary stent is present in the ascending colon. No arterial bleeding is identified at the level of the stent. 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 An enlarged trunk was identified emanating off of the distal aorta which when catheterized represents a hypertrophied left-sided lumbar artery versus other retroperitoneal branch that supplies no bowel. IMPRESSION: No arterial source for gastrointestinal bleeding identified by arteriography of the superior mesenteric artery. No evidence of underlying arteriovenous malformation or angiodysplasia. At the level of a migrated endoscopic biliary stent within the ascending colon, no active bleeding is demonstrated. 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 Electronically Signed   By: Aletta Edouard M.D.   On: 08/14/2016 10:38   Ir Angio/spinal Left  Result Date: 08/14/2016 INDICATION: Lower GI bleed and migration of previously placed endoscopic biliary stent into proximal colon. Nuclear medicine bleeding scan yesterday did not show evidence of active bleeding and request has been made to perform arteriography due to persistent clinical bleeding. Colonoscopy demonstrates bright red blood in the colon without focal bleeding source demonstrated. EXAM: 1. ULTRASOUND GUIDANCE FOR  VASCULAR ACCESS OF THE RIGHT COMMON FEMORAL ARTERY 2. SELECTIVE VISCERAL ARTERIOGRAPHY OF THE SUPERIOR MESENTERIC ARTERY 3. SELECTIVE ARTERIOGRAPHY OF LUMBAR ARTERY MEDICATIONS: 100 mcg intra-arterial nitroglycerin ANESTHESIA/SEDATION: Moderate (conscious) sedation was employed during this procedure. A total of Versed 1.5 mg and Fentanyl 25 mcg was administered intravenously. Moderate Sedation Time: 50 minutes. The patient's level of consciousness and vital signs were monitored continuously by radiology nursing throughout the procedure under my direct supervision. CONTRAST:  120 mL Isovue-300 FLUOROSCOPY TIME:  Fluoroscopy Time: 14 minutes 18 seconds. (171.6 mGy). COMPLICATIONS: None immediate. PROCEDURE: Informed consent was obtained from the patient's husband following explanation of the procedure, risks, benefits and alternatives. The patient's husband understands, agrees and consents for the procedure. All questions were addressed. A time out was performed prior to the initiation of the procedure. Maximal barrier sterile technique utilized including caps, mask, sterile gowns, sterile gloves, large sterile drape, hand hygiene, and Betadine prep. The right common femoral artery was localized by ultrasound and patency confirmed by ultrasound. Access of the artery was performed under direct ultrasound guidance with a micropuncture set and ultrasound image documentation performed. After access of the right common femoral artery, a 5 French sheath was advanced over a guidewire. A 5 French Cobra catheter was then advanced into the abdominal aorta. This was used to selectively catheterize the superior mesenteric artery. The catheter was advanced into the trunk of the superior mesenteric artery and initial selective arteriography performed. Additional selective arteriography was then performed of the entire SMA territory after intra-arterial administration of 100 mcg nitroglycerin. Attempt to find the origin of the  inferior mesenteric artery was then performed with the Cobra catheter. The Cobra catheter was then exchanged for a 5 [place] Sos catheter and attempts made to find and catheterize the inferior mesenteric artery  origin. Arteriography of the distal aorta was performed through the Sos catheter. Eventually, a vessel was catheterized and selectively injected which corresponded to a hypertrophied lumbar artery. The Sos catheter was then removed over a guidewire. Hemostasis was obtained at the femoral access site with the Cordis Exoseal device. FINDINGS: Initial arteriography of the superior mesenteric artery shows relative vasoconstriction with no evidence of arterial bleeding or other vascular abnormality. After administration of intra-arterial nitroglycerin, improved vasodilatation was present. Arteriography of the entire SMA supply demonstrates no evidence of active arterial bleeding, pseudoaneurysm, AV malformation or angiodysplasia. An endoscopic plastic biliary stent is present in the ascending colon. No arterial bleeding is identified at the level of the stent. 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 An enlarged trunk was identified emanating off of the distal aorta which when catheterized represents a hypertrophied left-sided lumbar artery versus other retroperitoneal branch that supplies no bowel. IMPRESSION: No arterial source for gastrointestinal bleeding identified by arteriography of the superior mesenteric artery. No evidence of underlying arteriovenous malformation or angiodysplasia. At the level of a migrated endoscopic biliary stent within the ascending colon, no active bleeding is demonstrated. 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 Electronically Signed   By: Aletta Edouard M.D.   On: 08/14/2016 10:38   Ir US Guide Vasc Access Right  Result Date:  08/14/2016 INDICATION: Lower GI bleed and migration of previously placed endoscopic biliary stent into proximal colon. Nuclear medicine bleeding scan yesterday did not show evidence of active bleeding and request has been made to perform arteriography due to persistent clinical bleeding. Colonoscopy demonstrates bright red blood in the colon without focal bleeding source demonstrated. EXAM: 1. ULTRASOUND GUIDANCE FOR VASCULAR ACCESS OF THE RIGHT COMMON FEMORAL ARTERY 2. SELECTIVE VISCERAL ARTERIOGRAPHY OF THE SUPERIOR MESENTERIC ARTERY 3. SELECTIVE ARTERIOGRAPHY OF LUMBAR ARTERY MEDICATIONS: 100 mcg intra-arterial nitroglycerin ANESTHESIA/SEDATION: Moderate (conscious) sedation was employed during this procedure. A total of Versed 1.5 mg and Fentanyl 25 mcg was administered intravenously. Moderate Sedation Time: 50 minutes. The patient's level of consciousness and vital signs were monitored continuously by radiology nursing throughout the procedure under my direct supervision. CONTRAST:  120 mL Isovue-300 FLUOROSCOPY TIME:  Fluoroscopy Time: 14 minutes 18 seconds. (171.6 mGy). COMPLICATIONS: None immediate. PROCEDURE: Informed consent was obtained from the patient's husband following explanation of the procedure, risks, benefits and alternatives. The patient's husband understands, agrees and consents for the procedure. All questions were addressed. A time out was performed prior to the initiation of the procedure. Maximal barrier sterile technique utilized including caps, mask, sterile gowns, sterile gloves, large sterile drape, hand hygiene, and Betadine prep. The right common femoral artery was localized by ultrasound and patency confirmed by ultrasound. Access of the artery was performed under direct ultrasound guidance with a micropuncture set and ultrasound image documentation performed. After access of the right common femoral artery, a 5 French sheath was advanced over a guidewire. A 5 French Cobra catheter  was then advanced into the abdominal aorta. This was used to selectively catheterize the superior mesenteric artery. The catheter was advanced into the trunk of the superior mesenteric artery and initial selective arteriography performed. Additional selective arteriography was then performed of the entire SMA territory after intra-arterial administration of 100 mcg nitroglycerin. Attempt to find the origin of the inferior mesenteric artery was then performed with the Cobra catheter. The Cobra catheter was  then exchanged for a 5 [place] Sos catheter and attempts made to find and catheterize the inferior mesenteric artery origin. Arteriography of the distal aorta was performed through the Sos catheter. Eventually, a vessel was catheterized and selectively injected which corresponded to a hypertrophied lumbar artery. The Sos catheter was then removed over a guidewire. Hemostasis was obtained at the femoral access site with the Cordis Exoseal device. FINDINGS: Initial arteriography of the superior mesenteric artery shows relative vasoconstriction with no evidence of arterial bleeding or other vascular abnormality. After administration of intra-arterial nitroglycerin, improved vasodilatation was present. Arteriography of the entire SMA supply demonstrates no evidence of active arterial bleeding, pseudoaneurysm, AV malformation or angiodysplasia. An endoscopic plastic biliary stent is present in the ascending colon. No arterial bleeding is identified at the level of the stent. 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 An enlarged trunk was identified emanating off of the distal aorta which when catheterized represents a hypertrophied left-sided lumbar artery versus other retroperitoneal branch that supplies no bowel. IMPRESSION: No arterial source for gastrointestinal bleeding identified by arteriography of the superior mesenteric artery. No evidence of underlying  arteriovenous malformation or angiodysplasia. At the level of a migrated endoscopic biliary stent within the ascending colon, no active bleeding is demonstrated. 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 Electronically Signed   By: Aletta Edouard M.D.   On: 08/14/2016 10:38   Dg Chest Portable 1 View  Result Date: 08/08/2016 CLINICAL DATA:  Dyspnea and EXAM: PORTABLE CHEST 1 VIEW COMPARISON:  03/14/2016 FINDINGS: The heart size and mediastinal contours are within normal limits. Aortic atherosclerosis is noted. No pneumonic consolidation, effusion or pneumothorax. Chronic subpleural interstitial prominence may reflect areas of fibrosis, scarring and/or chronic interstitial lung disease. Osteoarthritis of both glenohumeral joints with spurring and joint space narrowing. Slight remodeling along the undersurface of the distal acromion and clavicle on the left. This can be seen with chronic rotator cuff tear or rheumatoid arthritis. IMPRESSION: 1. Aortic atherosclerosis. 2. Mild stable interstitial prominence which may reflect subpleural fibrosis, scarring or sequela of chronic interstitial disease. 3. No acute pneumonic consolidation or CHF. Electronically Signed   By: Ashley Royalty M.D.   On: 08/08/2016 19:52   Dg Ercp  Result Date: 08/09/2016 CLINICAL DATA:  ERCP EXAM: ERCP TECHNIQUE: Multiple spot images obtained with the fluoroscopic device and submitted for interpretation post-procedure. COMPARISON:  CT abdomen pelvis - 08/08/2016 FINDINGS: 3 spot intraoperative fluoroscopic images of the right upper abdominal quadrant during ERCP are provided for review Initial image demonstrates an ERCP probe overlying the right upper abdominal quadrant. Post cholecystectomy. There is selective cannulation opacification of the common bile duct which appears markedly dilated compatible with the findings seen on preceding abdominal CT.  There is no definitive opacification of the pancreatic or residual cystic ducts. There is no significant opacification of the intrahepatic biliary system. No definitive filling defects are seen with the opacified portions of the common bile duct to suggest the presence of choledocholithiasis. Completion image demonstrates placement of a internal biliary stent overlying the distal aspect of the common bile duct. IMPRESSION: ERCP with biliary stent placement as above. These images were submitted for radiologic interpretation only. Please see the procedural report for the amount of contrast and the fluoroscopy time utilized. Electronically Signed   By: Sandi Mariscal M.D.   On: 08/09/2016 14:36   Dg  Abd 2 Views  Result Date: 08/13/2016 CLINICAL DATA:  83 year old female with displaced biliary stent. Subsequent encounter. EXAM: ABDOMEN - 2 VIEW COMPARISON:  08/12/2016 plain film exam.  08/11/2016 CT. FINDINGS: Biliary stent has shifted position slightly now located within the right lower abdomen/ right upper pelvis. It is possible this is in the right colon. No findings of bowel obstruction or free intraperitoneal air. Gas within the biliary system felt to be related to stent placement. Degenerative changes lumbar spine convex left. Post left hip replacement. Post cholecystectomy. IMPRESSION: Biliary stent has shifted position slightly now located within the right lower abdomen/ right upper pelvis. It is possible this is in the right colon. Electronically Signed   By: Genia Del M.D.   On: 08/13/2016 13:29   Dg Abd 2 Views  Result Date: 08/12/2016 CLINICAL DATA:  Displacement of biliary stent. EXAM: ABDOMEN - 2 VIEW COMPARISON:  CT of the abdomen and pelvis 08/11/2016 FINDINGS: The biliary stent is noted in the right-sided the abdomen, projecting over the ascending colon on all images. Levoconvex curvature is present in the lumbar spine. Atherosclerotic changes are noted. A partial left hip replacement is noted.  IMPRESSION: The biliary stent projects over the right side of the abdomen, potentially within the ascending colon. Electronically Signed   By: San Morelle M.D.   On: 08/12/2016 21:30   US Abdomen Limited Ruq  Result Date: 08/10/2016 CLINICAL DATA:  Cholangitis. ERCP earlier this day with stent placement. EXAM: ULTRASOUND ABDOMEN LIMITED RIGHT UPPER QUADRANT COMPARISON:  Abdominal CT yesterday. FINDINGS: Gallbladder: Surgically absent. Common bile duct: Diameter: 17 mm at the porta hepatis. 2.2 x 2.5 cm distally. Biliary stent is visualized. Liver: No focal lesion identified. Linear echogenic shadowing is likely pneumobilia related to recent ERCP. Normal directional flow in the main portal vein. 2.6 cm cyst in the pancreatic head, as seen on CT. IMPRESSION: 1. Biliary dilatation with biliary stent visualized in the common bile duct. The degree of biliary dilatation appears similar to CT yesterday. 2. Linear echogenic shadowing structures in the liver likely pneumobilia. Electronically Signed   By: Jeb Levering M.D.   On: 08/10/2016 02:27     [facility], Onia Shiflett M.D on 08/14/2016 at 1:57 PM  Between 7am to 7pm - Pager - [phone]  After 7pm go to [email or web address] - password [facility]  [facility] -  Office  [phone]

## 2016-08-15 ENCOUNTER — Encounter (HOSPITAL_COMMUNITY): Payer: Self-pay | Admitting: *Deleted

## 2016-08-15 ENCOUNTER — Inpatient Hospital Stay (HOSPITAL_COMMUNITY): Payer: PPO | Admitting: Certified Registered Nurse Anesthetist

## 2016-08-15 ENCOUNTER — Encounter (HOSPITAL_COMMUNITY)
Admission: EM | Disposition: A | Discharge status: Home Health | Payer: Self-pay | Source: Home / Self Care | Attending: Internal Medicine

## 2016-08-15 DIAGNOSIS — F310 Bipolar disorder, current episode hypomanic: Secondary | ICD-10-CM

## 2016-08-15 HISTORY — PX: COLONOSCOPY WITH PROPOFOL: SHX5780

## 2016-08-15 LAB — BPAM RBC
Blood Product Expiration Date: 201806202359
Blood Product Expiration Date: 201806202359
Blood Product Expiration Date: 201806202359
Blood Product Expiration Date: 201806202359
Blood Product Expiration Date: 201806232359
Blood Product Expiration Date: 201806232359
Blood Product Expiration Date: 201806252359
Blood Product Expiration Date: 201806252359
ISSUE DATE / TIME: 201806091834
ISSUE DATE / TIME: 201806092222
ISSUE DATE / TIME: 201806100416
ISSUE DATE / TIME: 201806101257
ISSUE DATE / TIME: 201806110251
ISSUE DATE / TIME: 201806110743
ISSUE DATE / TIME: 201806120207
ISSUE DATE / TIME: 201806120749
Unit Type and Rh: 6200
Unit Type and Rh: 6200
Unit Type and Rh: 6200
Unit Type and Rh: 6200
Unit Type and Rh: 6200
Unit Type and Rh: 6200
Unit Type and Rh: 6200
Unit Type and Rh: 6200

## 2016-08-15 LAB — TYPE AND SCREEN
ABO/RH(D): A POS
ANTIBODY SCREEN: NEGATIVE
UNIT DIVISION: 0
UNIT DIVISION: 0
Unit division: 0
Unit division: 0
Unit division: 0
Unit division: 0
Unit division: 0
Unit division: 0

## 2016-08-15 LAB — CBC
HCT: 23.7 % — ABNORMAL LOW (ref 36.0–46.0)
HEMOGLOBIN: 8.1 g/dL — AB (ref 12.0–15.0)
MCH: 29.9 pg (ref 26.0–34.0)
MCHC: 34.2 g/dL (ref 30.0–36.0)
MCV: 87.5 fL (ref 78.0–100.0)
PLATELETS: 93 10*3/uL — AB (ref 150–400)
RBC: 2.71 MIL/uL — AB (ref 3.87–5.11)
RDW: 15.5 % (ref 11.5–15.5)
WBC: 6.6 10*3/uL (ref 4.0–10.5)

## 2016-08-15 LAB — COMPREHENSIVE METABOLIC PANEL
ALK PHOS: 58 U/L (ref 38–126)
ALT: 59 U/L — AB (ref 14–54)
ANION GAP: 5 (ref 5–15)
AST: 18 U/L (ref 15–41)
Albumin: 1.8 g/dL — ABNORMAL LOW (ref 3.5–5.0)
BILIRUBIN TOTAL: 0.8 mg/dL (ref 0.3–1.2)
BUN: 8 mg/dL (ref 6–20)
CALCIUM: 7 mg/dL — AB (ref 8.9–10.3)
CO2: 18 mmol/L — AB (ref 22–32)
CREATININE: 0.8 mg/dL (ref 0.44–1.00)
Chloride: 116 mmol/L — ABNORMAL HIGH (ref 101–111)
GFR calc Af Amer: >60 mL/min (ref >60)
Glucose, Bld: 93 mg/dL (ref 65–99)
Potassium: 3.2 mmol/L — ABNORMAL LOW (ref 3.5–5.1)
Sodium: 139 mmol/L (ref 135–145)
TOTAL PROTEIN: 3.4 g/dL — AB (ref 6.5–8.1)

## 2016-08-15 LAB — GLUCOSE, CAPILLARY
GLUCOSE-CAPILLARY: 127 mg/dL — AB (ref 65–99)
Glucose-Capillary: 158 mg/dL — ABNORMAL HIGH (ref 65–99)
Glucose-Capillary: 160 mg/dL — ABNORMAL HIGH (ref 65–99)
Glucose-Capillary: 46 mg/dL — ABNORMAL LOW (ref 65–99)
Glucose-Capillary: 95 mg/dL (ref 65–99)

## 2016-08-15 LAB — HEMOGLOBIN AND HEMATOCRIT, BLOOD
HEMATOCRIT: 24.9 % — AB (ref 36.0–46.0)
HEMATOCRIT: 28 % — AB (ref 36.0–46.0)
HEMATOCRIT: 28.1 % — AB (ref 36.0–46.0)
HEMOGLOBIN: 9.5 g/dL — AB (ref 12.0–15.0)
Hemoglobin: 8.5 g/dL — ABNORMAL LOW (ref 12.0–15.0)
Hemoglobin: 9.3 g/dL — ABNORMAL LOW (ref 12.0–15.0)

## 2016-08-15 SURGERY — COLONOSCOPY WITH PROPOFOL
Anesthesia: Monitor Anesthesia Care

## 2016-08-15 MED ORDER — LORAZEPAM 2 MG/ML IJ SOLN: 0.5000 mg | Freq: Once | INTRAMUSCULAR | Status: DC

## 2016-08-15 MED ORDER — POTASSIUM CHLORIDE CRYS ER 20 MEQ PO TBCR
40.0000 meq | EXTENDED_RELEASE_TABLET | Freq: Four times a day (QID) | ORAL | Status: AC
  Administered 2016-08-15 (×2): 40 meq via ORAL
  Filled 2016-08-15 (×2): qty 2

## 2016-08-15 MED ORDER — ENSURE ENLIVE PO LIQD
237.0000 mL | Freq: Two times a day (BID) | ORAL | Status: DC
  Administered 2016-08-16 (×2): 237 mL via ORAL

## 2016-08-15 MED ORDER — PROPOFOL 500 MG/50ML IV EMUL
INTRAVENOUS | Status: DC | PRN
  Administered 2016-08-15: 100 ug/kg/min via INTRAVENOUS

## 2016-08-15 MED ORDER — SODIUM CHLORIDE 0.9 % IJ SOLN
PREFILLED_SYRINGE | INTRAMUSCULAR | Status: DC | PRN
  Administered 2016-08-15: 3 mL

## 2016-08-15 MED ORDER — SODIUM CHLORIDE 0.9 % IV SOLN: INTRAVENOUS | Status: DC

## 2016-08-15 MED ORDER — PROPOFOL 10 MG/ML IV BOLUS
INTRAVENOUS | Status: DC | PRN
Start: 2016-08-15 — End: 2016-08-15
  Administered 2016-08-15 (×2): 20 mg via INTRAVENOUS

## 2016-08-15 MED ORDER — EPINEPHRINE PF 1 MG/10ML IJ SOSY
PREFILLED_SYRINGE | INTRAMUSCULAR | Status: AC
  Filled 2016-08-15: qty 10

## 2016-08-15 SURGICAL SUPPLY — 22 items

## 2016-08-15 NOTE — Progress Notes (Signed)
PT Cancellation Note  Patient Details Name: [name] MRN: [id number] DOB: 19-Jan-1930   Cancelled Treatment:    Reason Eval/Treat Not Completed: Patient at procedure or test/unavailable Pt off floor at colonoscopy. Will follow up as time allows.   [name] [name] 08/15/2016, 9:58 AM Wray Kearns, PT, DPT [phone]

## 2016-08-15 NOTE — Anesthesia Preprocedure Evaluation (Signed)
Anesthesia Evaluation  Patient identified by MRN, date of birth, ID band Patient awake    Reviewed: Allergy & Precautions, H&P , NPO status , Patient's Chart, lab work & pertinent test results  Airway Mallampati: II   Neck ROM: full    Dental   Pulmonary neg pulmonary ROS,    breath sounds clear to auscultation       Cardiovascular hypertension, +CHF   Rhythm:regular Rate:Normal     Neuro/Psych PSYCHIATRIC DISORDERS Depression Bipolar Disorder TIA Neuromuscular disease    GI/Hepatic GERD  ,  Endo/Other  diabetes, Type 2  Renal/GU Renal InsufficiencyRenal disease     Musculoskeletal   Abdominal   Peds  Hematology  (+) anemia ,   Anesthesia Other Findings   Reproductive/Obstetrics                             Anesthesia Physical Anesthesia Plan  ASA: II  Anesthesia Plan: MAC   Post-op Pain Management:    Induction: Intravenous  PONV Risk Score and Plan: 2 and Ondansetron, Treatment may vary due to age or medical condition and Propofol  Airway Management Planned:   Additional Equipment:   Intra-op Plan:   Post-operative Plan:   Informed Consent: I have reviewed the patients History and Physical, chart, labs and discussed the procedure including the risks, benefits and alternatives for the proposed anesthesia with the patient or authorized representative who has indicated his/her understanding and acceptance.     Plan Discussed with: CRNA and Anesthesiologist  Anesthesia Plan Comments:         Anesthesia Quick Evaluation

## 2016-08-15 NOTE — Progress Notes (Signed)
Nutrition Follow-up  DOCUMENTATION CODES:   Not applicable  INTERVENTION:   -Ensure Enlive po BID, each supplement provides 350 kcal and 20 grams of protein  NUTRITION DIAGNOSIS:   Inadequate oral intake related to altered GI function as evidenced by NPO status.  Ongoing  GOAL:   Patient will meet greater than or equal to 90% of their needs  Progressing  MONITOR:   PO intake, Supplement acceptance, Diet advancement, Labs, Weight trends, Skin, I & O's  REASON FOR ASSESSMENT:   Malnutrition Screening Tool    ASSESSMENT:   [name] is a 83 y.o. female with a past medical history significant for Bipolar disorder, HTN, NIDDM, CKD baseline Cr 1.1, HFpEF and chronic pain (interstitial cystitis and chronic back pain) who presents with fever and epigastric pain.  6/10-s/p EGD- no source of GIB, normal duodenal dulb with duodenal diverticulum 6/11- s/p flexible sigmoid scopy, revealed blood in rectum, recto-sigmoid colon, and sigmoid colon, diverticulosis in sigmoid colin, and non-bleeding hemorrhoids, 6/11- s/p slective SMA arteriography  Pt out of room for colonoscopy at time of visit. No family available to provide additional hx.   Case discussed with RN, who confirms that pt has had multiple instances of rectal bleeding and multiple GI procedures. Pt is fairly weak, however, RN plans to transfer pt to chair today. Pt was fairly active at baseline with help of very supportive family members.   Per GI notes from 08/15/16, colonoscopy revealed a inflammatory polyp in the cecum with oozing of blood. Per MD notes, plan to advance to full liquid diet.   Labs reviewed: K: 3.2, CBGS: 46-95.   Diet Order:  Diet NPO time specified  Skin:  Reviewed, no issues  Last BM:  08/15/16  Height:   Ht Readings from Last 1 Encounters:  08/10/16 5\' 1"  (1.549 m)    Weight:   Wt Readings from Last 1 Encounters:  08/10/16 149 lb 7.6 oz (67.8 kg)    Ideal Body Weight:  56.8  kg  BMI:  Body mass index is 28.24 kg/m.  Estimated Nutritional Needs:   Kcal:  1450-1650  Protein:  70-85 grams  Fluid:  1.4-1.6 L  EDUCATION NEEDS:   Education needs no appropriate at this time  Vontae Court A. Jimmye Norman, RD, LDN, CDE Pager: [phone] After hours Pager: [phone]

## 2016-08-15 NOTE — Progress Notes (Signed)
Physical Therapy Treatment Patient Details Name: [name] MRN: [id number] DOB: 1929-08-20 Today's Date: 08/15/2016    History of Present Illness 83 y.o.female admitted 08/08/16 with fever and epigastric pain.  Patient with bloody stools. S/P ERCP with CBD stent.  EGD negative for blood 6/11. s/p colonscopy 6/13 with found polyps that were clipped.  PMHx Bipolar disorder, HTN, DM, CKD stage III ( baseline Cr 1.1), CHF, and chronic pain (interstitial cystitis and chronic back pain)    PT Comments    Patient progressing well towards PT goals. Improved ambulation distance with Min guard assist for safety. Pt fatigues quickly and demonstrates 3/4 DOE. RR up to 40. Other VSS. Continues to demonstrate weakness and decreased endurance. Will continue to follow and progress as tolerated.    Follow Up Recommendations  Home health PT;Supervision/Assistance - 24 hour     Equipment Recommendations  None recommended by PT    Recommendations for Other Services       Precautions / Restrictions Precautions Precautions: Fall Restrictions Weight Bearing Restrictions: No    Mobility  Bed Mobility Overal bed mobility: Needs Assistance Bed Mobility: Sit to Supine       Sit to supine: Mod assist;HOB elevated   General bed mobility comments: Assist to bring LEs into bed to return to supine. Able to help adjusting bottom by pushing through heels.  Transfers Overall transfer level: Needs assistance Equipment used: Rolling walker (2 wheeled) Transfers: Sit to/from Stand Sit to Stand: Min guard         General transfer comment: Min guard for safety.   Ambulation/Gait Ambulation/Gait assistance: Min guard Ambulation Distance (Feet): 100 Feet Assistive device: Rolling walker (2 wheeled) Gait Pattern/deviations: Step-through pattern;Decreased stride length;Shuffle;Trunk flexed Gait velocity: decreased Gait velocity interpretation: Below normal speed for age/gender General Gait Details:  VC's for upright posture. gait speed varies. 3/4 DOE. Fatigues. HR stable.   Stairs            Wheelchair Mobility    Modified Rankin (Stroke Patients Only)       Balance Overall balance assessment: Needs assistance Sitting-balance support: Feet supported;No upper extremity supported Sitting balance-Leahy Scale: Fair     Standing balance support: During functional activity Standing balance-Leahy Scale: Poor Standing balance comment: Reliant on BUEs for support in standing.                             Cognition Arousal/Alertness: Awake/alert Behavior During Therapy: WFL for tasks assessed/performed Overall Cognitive Status: Within Functional Limits for tasks assessed                                        Exercises      General Comments        Pertinent Vitals/Pain Pain Assessment: Faces Faces Pain Scale: Hurts little more Pain Location: back and bottom Pain Descriptors / Indicators: Sore;Aching;Constant Pain Intervention(s): Monitored during session;Repositioned;Premedicated before session    Home Living                      Prior Function            PT Goals (current goals can now be found in the care plan section) Progress towards PT goals: Progressing toward goals    Frequency    Min 3X/week      PT Plan Current plan remains  appropriate    Co-evaluation              AM-PAC PT "6 Clicks" Daily Activity  Outcome Measure  Difficulty turning over in bed (including adjusting bedclothes, sheets and blankets)?: None Difficulty moving from lying on back to sitting on the side of the bed? : Total Difficulty sitting down on and standing up from a chair with arms (e.g., wheelchair, bedside commode, etc,.)?: None Help needed moving to and from a bed to chair (including a wheelchair)?: A Little Help needed walking in hospital room?: A Little Help needed climbing 3-5 steps with a railing? : A Little 6 Click  Score: 18    End of Session Equipment Utilized During Treatment: Gait belt Activity Tolerance: Patient tolerated treatment well Patient left: in bed;with call bell/phone within reach Nurse Communication: Mobility status PT Visit Diagnosis: Difficulty in walking, not elsewhere classified (R26.2);Muscle weakness (generalized) (M62.81)     Time: 9381-0175 PT Time Calculation (min) (ACUTE ONLY): 19 min  Charges:  $Therapeutic Exercise: 8-22 mins                    G Codes:       Wray Kearns, PT, DPT [phone]     [name] A Domique Reardon 08/15/2016, 3:35 PM

## 2016-08-15 NOTE — Brief Op Note (Signed)
08/08/2016 - 08/15/2016  10:54 AM  PATIENT:  [name]  83 y.o. female  PRE-OPERATIVE DIAGNOSIS:  Rectal bleeding  POST-OPERATIVE DIAGNOSIS:  bleeding cecal polyp injected with epi and clipped, ascending colon polyp bx, retrieval of CBD stent loose in colon  PROCEDURE:  Procedure(s): COLONOSCOPY WITH PROPOFOL (N/A)  SURGEON:  Surgeon(s) and Role:    * Fremon Zacharia, MD - Primary  Findings / recommendations --------------------------------------- - Colonoscopy showed 1 inflammatory polyp in the cecum with oozing of blood. Patient had a pulsatile bleeding from one spot. This area was treated with 4 mL epinephrine injection and 1 clip placement. Polypectomy was not attempted because I could not find the base of the polyp - Start full liquid diet now. - Follow biopsy report. May need repeat colonoscopy as outpatient with the complete prep depending on biopsy finding - If patient continues to have bleeding, she may need surgery consult. - Monitor H&H. Transfuse as needed - GI will follow    Otis Brace MD, [place]nard 08/15/2016, 10:58 AM  Pager [phone]  If no answer or after 5 PM call [phone]

## 2016-08-15 NOTE — Op Note (Signed)
[facility] Patient Name: [name] Procedure Date : 08/15/2016 MRN: [id number] Attending MD: Otis Brace , MD Date of Birth: 1929-05-29 CSN: [id number] Age: 83 Admit Type: Inpatient Procedure:                Colonoscopy Indications:              Rectal bleeding Providers:                Otis Brace, MD, Burtis Junes, RN, Corliss Parish, Technician Referring MD:              Medicines:                Sedation Administered by an Anesthesia Professional Complications:            Moderate bleeding Estimated Blood Loss:     Estimated blood loss was minimal. Procedure:                Pre-Anesthesia Assessment:                           - Prior to the procedure, a History and Physical                            was performed, and patient medications and                            allergies were reviewed. The patient's tolerance of                            previous anesthesia was also reviewed. The risks                            and benefits of the procedure and the sedation                            options and risks were discussed with the patient.                            All questions were answered, and informed consent                            was obtained. Prior Anticoagulants: The patient has                            taken no previous anticoagulant or antiplatelet                            agents. ASA Grade Assessment: III - A patient with                            severe systemic disease. After reviewing the risks  and benefits, the patient was deemed in                            satisfactory condition to undergo the procedure.                           After obtaining informed consent, the colonoscope                            was passed under direct vision. Throughout the                            procedure, the patient's blood pressure, pulse, and                            oxygen  saturations were monitored continuously. The                            EC-[id number] ([id number]) scope was introduced through                            the anus and advanced to the the cecum, identified                            by appendiceal orifice and ileocecal valve. The                            colonoscopy was technically difficult and complex                            due to inadequate bowel prep. The patient tolerated                            the procedure well. The quality of the bowel                            preparation was fair except the cecum was poor. The                            terminal ileum and the appendiceal orifice were                            photographed. The quality of the bowel preparation                            was fair except the cecum was poor. Scope In: 10:06:43 AM Scope Out: 10:36:38 AM Scope Withdrawal Time: 0 hours 18 minutes 29 seconds  Total Procedure Duration: 0 hours 29 minutes 55 seconds  Findings:      The perianal and digital rectal examinations were normal.      A 15 mm polypoid lesion was found in the cecum near IC valve with oozing       of blood. polypectomy was not attempted because I was not able to  identify the base of the polyp because of the poor prep and difficult       location.Marland Kitchenbiopsy was performed The polyp was multi-lobulated. there was       active oozing with pulsatile blood flow. This area was treated with 4 mL       epinephrine injection and 1 clip placement.      A diminutive polyp was found in the ascending colon. The polyp was flat.       The polyp was removed with a cold biopsy forceps. Resection and       retrieval were complete.      A foreign body was found in the proximal transverse colon. Removal of a       stent was accomplished with a snare.      Scope was reintroduced and advanced up to descending colon. No further       bleeding or any complication identified. Impression:               - One 15 mm  polyp in the cecum.                           - One diminutive polyp in the ascending colon,                            removed with a cold biopsy forceps. Resected and                            retrieved.                           - Foreign body in the proximal transverse colon.                            Removal was successful. Moderate Sedation:      Moderate (conscious) sedation was personally administered by an       anesthesia professional. The following parameters were monitored: oxygen       saturation, heart rate, blood pressure, and response to care. Recommendation:           - Return patient to hospital ward for ongoing care.                           - Full liquid diet.                           - Continue present medications.                           - Await pathology results.                           - Repeat colonoscopy date to be determined after                            pending pathology results are reviewed for                            surveillance based on pathology results.                           -  Refer to a surgeon if symptoms persist. Procedure Code(s):        --- Professional ---                           [phone], Colonoscopy, flexible; with removal of                            foreign body(s)                           [id number], Colonoscopy, flexible; with biopsy, single                            or multiple Diagnosis Code(s):        --- Professional ---                           D12.0, Benign neoplasm of cecum                           D12.2, Benign neoplasm of ascending colon                           T18.4XXA, Foreign body in colon, initial encounter                           K62.5, Hemorrhage of anus and rectum CPT copyright 2016 [employer]. All rights reserved. The codes documented in this report are preliminary and upon coder review may  be revised to meet current compliance requirements. Otis Brace, MD Otis Brace,  MD 08/15/2016 10:51:52 AM Number of Addenda: 0

## 2016-08-15 NOTE — Progress Notes (Signed)
PROGRESS NOTE                                                                                                                                                                                                             Patient Demographics:    [name], is a 83 y.o. female, DOB - [birth year], TMA:[id number]  Admit date - 08/08/2016   Admitting Physician Edwin Dada, MD  Outpatient Primary MD for the patient is Lavone Orn, MD  LOS - 6   Chief Complaint  Patient presents with  . Chest Pain       Brief Narrative   83 y.o.WF PMHx Bipolar disorder, HTN, DM, CKD stage III ( baseline Cr 1.1), CHF, and chronic pain (interstitial cystitis and chronic back pain) Who presentedwith fever and epigastric pain. She was found to have cholangitis, status post ERCP 08/09/2016, patient started to have dark bloody bowel movement 08/11/2016, will she has been transfused total of 4 units PRBC, repeat endoscopy 6/10 with no evidence of active GI bleed, as well with negative bleeding scan, she continues to have active bleeding, Flexible sigmoidoscopy with evidence of fresh and old blood, but could not identify source, went for angiogram by IR which could not identify source of bleed as well, continues requiring frequent transfusion with active GI bleed, plan for colonoscopy today .   Subjective:   Patient in bed, appears comfortable, denies any headache, no fever, no chest pain or pressure, no shortness of breath , no abdominal pain. No focal weakness.    Assessment  & Plan :    Sepsis due to Klebsiella bacteremia/Cholangitis And transaminitis - Resolving S/P stent placement CBD - Per GI Patient will need an EGD in 1 month,as an outpatient for removal of CBD stent which will be scheduled. However CBD stent seems to have migrated into the gut at this time. GI is following. Her LFTs are trending down. Continue to monitor. She is now pain-free.   Klebsiella  pneumoniae Bacteremia - Related with IV Zosyn, culture showing pansensitive, so transitioned to Rocephin 6/11.  GI bleed with Acute blood loss anemia - Most likely lower GI bleed is EGD was unremarkable, so far required 8 units of packed RBC, due for colonoscopy on 08/15/2016, continue to monitor H&H which for now seems to have stabilized and monitor.  Elevated troponin -  This was demand ischemia due to anemia as above, troponin trend was flat and in non-ACS pattern, cannot get aspirin due to GI bleed, repeat echocardiogram done this admission shows EF of 55-60% with no regional wall motion abnormality. Continue to follow. Patient chest pain-free.   CKD stage III (Baseline creatinine 1.1) Creatinine close to baseline.  Chronic diastolic congestive heart failure - Euvolemic and compensated   Essential HTN - Currently blood pressure acceptable, amlodipine has been stopped secondary to GI bleed, continue with metoprolol, and when necessary hydralazine .  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 Replaced.   CBG (last 3)   Recent Labs  08/14/16 1953 08/15/16 0017 08/15/16 0019  GLUCAP 91 46* 95     Bipolar disorder -Lithium 150 mg daily -Remeron 30 mg daily  MRSA screen positive     Code Status : DO NOT RESUSCITATE  Family Communication  : None at bedside  Disposition Plan  : Pending further workup  Barriers For Discharge : Still with active GI bleeding  Consults  :  GI  Procedures  :  6/7 ERCP - diffusely dilated CBD (2cm) w/ no visible filling defect - sphincterotomy performed leading to drainage of pus - CBD stent placed plastic CBD stent 5 cm 7 French  6/10 repeat EGD with no evidence of active GI bleed 6/11 Mesenteric Angiography  by Dr. Kathlene Cote 6/13 Colonoscopy   DVT Prophylaxis  :  SCDs   Lab Results  Component Value Date   PLT 93 (L) 08/15/2016     Antibiotics  :    Anti-infectives    Start     Dose/Rate Route Frequency Ordered Stop   08/13/16 2200  piperacillin-tazobactam (ZOSYN) IVPB 3.375 g  Status:  Discontinued     3.375 g 12.5 mL/hr over 240 Minutes Intravenous Every 8 hours 08/12/16 2003 08/13/16 0705   08/13/16 0800  [MAR Hold]  cefTRIAXone (ROCEPHIN) 2 g in dextrose 5 % 50 mL IVPB     (MAR Hold since 08/15/16 0855)   2 g 100 mL/hr over 30 Minutes Intravenous Every 24 hours 08/13/16 0705     08/09/16 2000  vancomycin (VANCOCIN) 500 mg in sodium chloride 0.9 % 100 mL IVPB  Status:  Discontinued     500 mg 100 mL/hr over 60 Minutes Intravenous Every 24 hours 08/08/16 1958 08/09/16 0125   08/09/16 0400  piperacillin-tazobactam (ZOSYN) IVPB 3.375 g  Status:  Discontinued     3.375 g 12.5 mL/hr over 240 Minutes Intravenous Every 8 hours 08/08/16 1958 08/12/16 2003   08/08/16 1930  piperacillin-tazobactam (ZOSYN) IVPB 3.375 g     3.375 g 100 mL/hr over 30 Minutes Intravenous  Once 08/08/16 1920 08/08/16 2104   08/08/16 1930  vancomycin (VANCOCIN) IVPB 1000 mg/200 mL premix     1,000 mg 200 mL/hr over 60 Minutes Intravenous  Once 08/08/16 1920 08/08/16 2116        Objective:   Vitals:   08/15/16 0015 08/15/16 0300 08/15/16 0616 08/15/16 0916  BP: (!) 128/56 (!) 142/50 (!) 146/52 (!) 160/43  Pulse: 73 63 70 68  Resp: 17 18 (!) 21 19  Temp: 98.7 F (37.1 C)  98.6 F (37 C) 98.1 F (36.7 C)  TempSrc: Oral  Oral Oral  SpO2: 97% 99% 97% 99%  Weight:      Height:        Wt Readings from Last 3 Encounters:  08/10/16 67.8  kg (149 lb 7.6 oz)  03/13/16 61.4 kg (135 lb 4.8 oz)  01/05/16 64.6 kg (142 lb 6.7 oz)     Intake/Output Summary (Last 24 hours) at 08/15/16 0930 Last data filed at 08/15/16 0600  Gross per 24 hour  Intake              860 ml  Output              150 ml  Net              710 ml     Physical Exam  Awake Alert, Oriented X 3, No new F.N deficits, Normal affect [place].AT,PERRAL Supple  Neck,No JVD, No cervical lymphadenopathy appriciated.  Symmetrical Chest wall movement, Good air movement bilaterally, CTAB RRR,No Gallops,Rubs or new Murmurs, No Parasternal Heave +ve B.Sounds, Abd Soft, No tenderness, No organomegaly appriciated, No rebound - guarding or rigidity. No Cyanosis, Clubbing or edema, No new Rash or bruise     Data Review:    CBC  Recent Labs Lab 08/08/16 1845  08/12/16 1833 08/13/16 0031  08/13/16 1833  08/14/16 0727 08/14/16 1316 08/14/16 1800 08/15/16 0013 08/15/16 0600  WBC 9.5  < > 8.2 7.4  --  10.7*  --  9.2  --   --   --  6.6  HGB 13.0  < > 9.9* 7.0*  < > 7.9*  < > 8.4* 9.5* 8.7* 8.5* 8.1*  HCT 40.3  < > 30.3* 21.2*  < > 23.2*  < > 25.1* 28.5* 25.3* 24.9* 23.7*  PLT 214  < > 112* 102*  --  121*  --  112*  --   --   --  93*  MCV 90.6  < > 86.6 86.5  --  86.2  --  89.3  --   --   --  87.5  MCH 29.2  < > 28.3 28.6  --  29.4  --  29.9  --   --   --  29.9  MCHC 32.3  < > 32.7 33.0  --  34.1  --  33.5  --   --   --  34.2  RDW 14.1  < > 16.6* 16.5*  --  15.0  --  15.4  --   --   --  15.5  LYMPHSABS 0.8  --   --   --   --  3.3  --   --   --   --   --   --   MONOABS 0.6  --   --   --   --  1.1*  --   --   --   --   --   --   EOSABS 0.0  --   --   --   --  0.1  --   --   --   --   --   --   BASOSABS 0.0  --   --   --   --  0.1  --   --   --   --   --   --   < > = values in this interval not displayed.  Chemistries   Recent Labs Lab 08/10/16 0429 08/11/16 0758 08/12/16 0150 08/13/16 0031 08/14/16 0727 08/15/16 0600  NA 140 144 141 141 143 139  K 3.6 4.0 4.3 4.7 5.4* 3.2*  CL 106 110 112* 114* 119* 116*  CO2 25 25 24 24  16* 18*  GLUCOSE 140* 152* 150* 123* 120*  93  BUN 27* 19 18 19 20 8   CREATININE 1.40* 1.13* 1.04* 1.02* 1.06* 0.80  CALCIUM 8.3* 8.9 7.8* 7.5* 8.1* 7.0*  AST 392* 120* 41 23  --  18  ALT 775* 476* 230* 133*  --  59*  ALKPHOS 254* 236* 134* 92  --  58  BILITOT 3.9* 3.2* 2.0* 1.1  --  0.8    ------------------------------------------------------------------------------------------------------------------ No results for input(s): CHOL, HDL, LDLCALC, TRIG, CHOLHDL, LDLDIRECT in the last 72 hours.  Lab Results  Component Value Date   HGBA1C 6.2 (H) 08/10/2016   ------------------------------------------------------------------------------------------------------------------ No results for input(s): TSH, T4TOTAL, T3FREE, THYROIDAB in the last 72 hours.  Invalid input(s): FREET3 ------------------------------------------------------------------------------------------------------------------ No results for input(s): VITAMINB12, FOLATE, FERRITIN, TIBC, IRON, RETICCTPCT in the last 72 hours.  Coagulation profile  Recent Labs Lab 08/08/16 2323  INR 1.13    No results for input(s): DDIMER in the last 72 hours.  Cardiac Enzymes  Recent Labs Lab 08/09/16 0441 08/09/16 1522 08/10/16 0429  TROPONINI 1.03* 0.49* 0.33*   ------------------------------------------------------------------------------------------------------------------    Component Value Date/Time   BNP 35.2 08/08/2016 1845    Inpatient Medications  Scheduled Meds: . [MAR Hold] atorvastatin  5 mg Oral Daily  . [MAR Hold] FLUoxetine  20 mg Oral Daily  . [MAR Hold] insulin aspart  0-9 Units Subcutaneous Q4H  . [MAR Hold] lithium carbonate  150 mg Oral Daily  . [MAR Hold] LORazepam  0.5 mg Intravenous Once  . [MAR Hold] metoprolol tartrate  12.5 mg Oral BID  . [MAR Hold] mirtazapine  30 mg Oral QHS  . [MAR Hold] oxybutynin  5 mg Oral BID  . [MAR Hold] pantoprazole (PROTONIX) IV  40 mg Intravenous Q12H  . [MAR Hold] polyethylene glycol  17 g Oral Daily  . [MAR Hold] sodium chloride flush  10-40 mL Intracatheter Q12H  . [MAR Hold] Vilazodone HCl  20 mg Oral Daily  . [MAR Hold] zolpidem  2.5 mg Oral QHS   Continuous Infusions: . sodium chloride 75 mL/hr at 08/14/16 1826  . [MAR Hold] sodium  chloride    . [MAR Hold] sodium chloride    . [MAR Hold] sodium chloride    . sodium chloride    . [MAR Hold] cefTRIAXone (ROCEPHIN)  IV Stopped (08/14/16 1145)   PRN Meds:.[MAR Hold] furosemide, [MAR Hold] hydrALAZINE, [MAR Hold] ondansetron **OR** [MAR Hold] ondansetron (ZOFRAN) IV, [MAR Hold] oxyCODONE, [MAR Hold] sodium chloride flush  Micro Results Recent Results (from the past 240 hour(s))  Blood Culture (routine x 2)     Status: Abnormal   Collection Time: 08/08/16  7:20 PM  Result Value Ref Range Status   Specimen Description BLOOD RIGHT WRIST  Final   Special Requests   Final    BOTTLES DRAWN AEROBIC AND ANAEROBIC Blood Culture adequate volume   Culture  Setup Time   Final    GRAM NEGATIVE RODS IN BOTH AEROBIC AND ANAEROBIC BOTTLES CRITICAL VALUE NOTED.  VALUE IS CONSISTENT WITH PREVIOUSLY REPORTED AND CALLED VALUE.    Culture (A)  Final    KLEBSIELLA PNEUMONIAE SUSCEPTIBILITIES PERFORMED ON PREVIOUS CULTURE WITHIN THE LAST 5 DAYS.    Report Status 08/11/2016 FINAL  Final  Blood Culture (routine x 2)     Status: Abnormal   Collection Time: 08/08/16  8:01 PM  Result Value Ref Range Status   Specimen Description BLOOD LEFT ANTECUBITAL  Final   Special Requests IN PEDIATRIC BOTTLE Blood Culture adequate volume  Final   Culture  Setup Time  Final    GRAM NEGATIVE RODS IN PEDIATRIC BOTTLE Organism ID to follow CRITICAL RESULT CALLED TO, READ BACK BY AND VERIFIED WITH: M. Prentiss Bells Pharm.D. 10:15 08/09/16 ([email or web address])    Culture KLEBSIELLA PNEUMONIAE (A)  Final   Report Status 08/11/2016 FINAL  Final   Organism ID, Bacteria KLEBSIELLA PNEUMONIAE  Final      Susceptibility   Klebsiella pneumoniae - MIC*    AMPICILLIN >=32 RESISTANT Resistant     CEFAZOLIN <=4 SENSITIVE Sensitive     CEFEPIME <=1 SENSITIVE Sensitive     CEFTAZIDIME <=1 SENSITIVE Sensitive     CEFTRIAXONE <=1 SENSITIVE Sensitive     CIPROFLOXACIN <=0.25 SENSITIVE Sensitive     GENTAMICIN <=1 SENSITIVE  Sensitive     IMIPENEM <=0.25 SENSITIVE Sensitive     TRIMETH/SULFA <=20 SENSITIVE Sensitive     AMPICILLIN/SULBACTAM 4 SENSITIVE Sensitive     PIP/TAZO <=4 SENSITIVE Sensitive     Extended ESBL NEGATIVE Sensitive     * KLEBSIELLA PNEUMONIAE  Blood Culture ID Panel (Reflexed)     Status: Abnormal   Collection Time: 08/08/16  8:01 PM  Result Value Ref Range Status   Enterococcus species NOT DETECTED NOT DETECTED Final   Listeria monocytogenes NOT DETECTED NOT DETECTED Final   Staphylococcus species NOT DETECTED NOT DETECTED Final   Staphylococcus aureus NOT DETECTED NOT DETECTED Final   Streptococcus species NOT DETECTED NOT DETECTED Final   Streptococcus agalactiae NOT DETECTED NOT DETECTED Final   Streptococcus pneumoniae NOT DETECTED NOT DETECTED Final   Streptococcus pyogenes NOT DETECTED NOT DETECTED Final   Acinetobacter baumannii NOT DETECTED NOT DETECTED Final   Enterobacteriaceae species DETECTED (A) NOT DETECTED Final    Comment: Enterobacteriaceae represent a large family of gram-negative bacteria, not a single organism. CRITICAL RESULT CALLED TO, READ BACK BY AND VERIFIED WITH: Hughie Closs Pharm.D. 10:15 08/09/16 ([email or web address])    Enterobacter cloacae complex NOT DETECTED NOT DETECTED Final   Escherichia coli NOT DETECTED NOT DETECTED Final   Klebsiella oxytoca NOT DETECTED NOT DETECTED Final   Klebsiella pneumoniae DETECTED (A) NOT DETECTED Final    Comment: CRITICAL RESULT CALLED TO, READ BACK BY AND VERIFIED WITH: Hughie Closs Pharm.D. 10:15 08/09/16 ([email or web address])    Proteus species NOT DETECTED NOT DETECTED Final   Serratia marcescens NOT DETECTED NOT DETECTED Final   Carbapenem resistance NOT DETECTED NOT DETECTED Final   Haemophilus influenzae NOT DETECTED NOT DETECTED Final   Neisseria meningitidis NOT DETECTED NOT DETECTED Final   Pseudomonas aeruginosa NOT DETECTED NOT DETECTED Final   Candida albicans NOT DETECTED NOT DETECTED Final   Candida glabrata NOT DETECTED NOT  DETECTED Final   Candida krusei NOT DETECTED NOT DETECTED Final   Candida parapsilosis NOT DETECTED NOT DETECTED Final   Candida tropicalis NOT DETECTED NOT DETECTED Final  Urine culture     Status: Abnormal   Collection Time: 08/08/16  8:04 PM  Result Value Ref Range Status   Specimen Description URINE, RANDOM  Final   Special Requests NONE  Final   Culture MULTIPLE SPECIES PRESENT, SUGGEST RECOLLECTION (A)  Final   Report Status 08/10/2016 FINAL  Final  MRSA PCR Screening     Status: Abnormal   Collection Time: 08/09/16  4:07 AM  Result Value Ref Range Status   MRSA by PCR POSITIVE (A) NEGATIVE Final    Comment:        The GeneXpert MRSA Assay (FDA approved for NASAL specimens only), is one component of a comprehensive MRSA colonization surveillance program. It  is not intended to diagnose MRSA infection nor to guide or monitor treatment for MRSA infections. RESULT CALLED TO, READ BACK BY AND VERIFIED WITH: [facility] RN AT [id number] 08/09/16 BY A.DAVIS     Radiology Reports Ct Abdomen Pelvis Wo Contrast  Result Date: 08/11/2016 CLINICAL DATA:  Worsening right-sided pain and low-grade fever in a patient that is status post cholecystectomy. Chronic kidney disease. Diabetes. Endoscopic ultrasound 02/23/2015. ERCP 2 days ago. Cholecystectomy 12/21/2015. EXAM: CT ABDOMEN AND PELVIS WITHOUT CONTRAST TECHNIQUE: Multidetector CT imaging of the abdomen and pelvis was performed following the standard protocol without IV contrast. COMPARISON:  Ultrasound of 08/09/2016. CT of 08/08/2016. ERCP of 08/09/2016. FINDINGS: Lower chest: Clear lung bases. Cardiomegaly with small bilateral pleural effusions. Hepatobiliary: No focal liver lesion. Cholecystectomy. Moderate intrahepatic biliary duct dilatation persists. Pneumobilia is new. Interval placement of a distal common duct stent, which terminates in the descending duodenum Pancreas: Pancreatic atrophy. No duct dilatation. Cystic lesion in the pancreatic  head measures 2.5 cm on image 26/ series 3 and is similar to on the prior. No evidence of acute pancreatitis. Spleen: Normal in size, without focal abnormality. Adrenals/Urinary Tract: Normal adrenal glands. Mild renal cortical thinning bilaterally. low-density left renal lesions are likely cysts. No hydronephrosis. Degraded evaluation of the pelvis, secondary to beam hardening artifact from left hip arthroplasty. Foley catheter in the urinary bladder. Stomach/Bowel: Normal stomach, without wall thickening. Scattered colonic diverticula. Normal terminal ileum. Duodenal diverticulum. Otherwise normal small bowel. Vascular/Lymphatic: Aortic and branch vessel atherosclerosis. No retroperitoneal or retrocrural adenopathy. Reproductive: Uterus and adnexa not well evaluated. Other: Trace pelvic fluid, including on image 72/series 3. No free intraperitoneal air. No abdominal ascites. Musculoskeletal: Left hip arthroplasty. Osteopenia. Convex left lumbar spine curvature. Advanced lumbosacral spondylosis. Minimal superior endplate compression deformity at L1 is unchanged. IMPRESSION: 1. Interval placement of a distal common duct stent. Persistent moderate intrahepatic biliary duct dilatation with new pneumobilia. Common duct not well evaluated. 2. No explanation for abdominal pain. 3. Degraded evaluation of the pelvis, secondary to beam hardening artifact from left hip arthroplasty. Trace cul-de-sac fluid is identified. 4.  Aortic atherosclerosis. 5. Small bilateral pleural effusions. 6. Similar size of a pancreatic head cystic lesion. Electronically Signed   By: Abigail Miyamoto M.D.   On: 08/11/2016 15:34   Ct Abdomen Pelvis Wo Contrast  Result Date: 08/09/2016 CLINICAL DATA:  Dyspnea and nausea starting today. Elevated lactic acid levels. EXAM: CT ABDOMEN AND PELVIS WITHOUT CONTRAST TECHNIQUE: Multidetector CT imaging of the abdomen and pelvis was performed following the standard protocol without IV contrast. COMPARISON:   01/03/2016 CT and chest CT performed earlier on the same day FINDINGS: Lower chest: Borderline cardiomegaly with bibasilar atelectasis and/or scarring. No pericardial effusion. There is coronary arteriosclerosis. Hepatobiliary: Status post cholecystectomy with chronic stable dilatation of the intra and extrahepatic biliary ducts without calculus noted. The common bile duct is slightly more dilated on current exam up to 2.1 cm at the pancreatic head versus 1.7 cm in 2017. No mural thickening of the bowel ducts is identified. Lack of IV contrast limits assessment for mucosal enhancement. Patient reportedly received IV contrast earlier for a chest CT. On that exam, no definite mucosal enhancement is noted. Pancreas: Stable 2.7 cm cystic lesion in the proximal body of the pancreas. No ductal dilatation is noted. Spleen: Normal in size without focal abnormality. Adrenals/Urinary Tract: Normal bilateral adrenal glands. Small left renal cysts are again visualized. Symmetric pyelograms noted bilaterally status post prior CT administration for the chest. No obstructive uropathy.  Contrast opacified urine distended bladder. Stomach/Bowel: All the stomach and small intestine are normal. There is a moderate amount of fecal retention throughout large bowel with circular muscle hypertrophy and extensive sigmoid diverticulosis noted similar in appearance to prior. The appendix is not visualized but no pericecal inflammation is noted. Vascular/Lymphatic: Diffuse atherosclerosis of the abdominal aorta and branch vessels without aneurysm. No retroperitoneal or pelvic sidewall adenopathy. No inguinal lymphadenopathy. Reproductive: Uterus and bilateral adnexa are unremarkable. Other: No abdominal wall hernia or abnormality. No abdominopelvic ascites. Musculoskeletal: Thoracolumbar spondylosis with multilevel degenerative disc disease along the lumbar spine. Osteopenia is noted. No acute fracture nor bone destruction. IMPRESSION: 1.  Coronary arteriosclerosis and aortic atherosclerosis. 2. Extensive sigmoid diverticulosis without acute diverticulitis. 3. Status post cholecystectomy with chronic intrahepatic and extrahepatic ductal dilatation without mural thickening or enhancement seen on recent same day CT with IV contrast. No conclusive findings of cholangitis. 4. Stable 2.7 cm pancreatic head cyst and small left-sided renal cysts. 5. Spondylosis of the lumbar spine. Electronically Signed   By: Ashley Royalty M.D.   On: 08/09/2016 00:04   Ct Angio Chest Pe W Or Wo Contrast  Result Date: 08/08/2016 CLINICAL DATA:  83 year old with acute onset of chest pain, shortness of breath and nausea and vomiting that began earlier today. EXAM: CT ANGIOGRAPHY CHEST WITH CONTRAST TECHNIQUE: Multidetector CT imaging of the chest was performed using the standard protocol during bolus administration of intravenous contrast. Multiplanar CT image reconstructions and MIPs were obtained to evaluate the vascular anatomy. CONTRAST:  100 mL Isovue 370 IV. COMPARISON:  Unenhanced CT chest 01/13/2015. FINDINGS: Beam hardening streak artifact is present as the patient was unable to raise the arms over the head. Respiratory motion blurred many of the images. Cardiovascular: Contrast opacification of the pulmonary arteries is very good. No filling defects within either main pulmonary artery or their segmental branches in either lung to suggest pulmonary embolism. Heart moderately enlarged with left ventricular predominance. Severe LAD and right coronary artery atherosclerosis. No pericardial effusion. Severe atherosclerosis involving the thoracic and upper abdominal aorta without evidence of aneurysm or dissection. Gas is present within the right subclavian vein and the lower right internal jugular vein, possibly related to an intravenous injection in the right upper extremity. Mediastinum/Nodes: Scattered normal sized mediastinal lymph nodes. No pathologically enlarged  mediastinal, hilar or axillary lymph nodes. No mediastinal masses. Normal-appearing esophagus. Visualized thyroid gland unremarkable. Lungs/Pleura: Low lung volumes. Scattered areas of localized hyperlucency in both lungs indicating focal air trapping. Scarring in the lingula and in the lower lobes. No confluent airspace consolidation. No significant pulmonary parenchymal nodules or masses, as there is a stable subpleural nodule adjacent to the right major fissure (series 7, image 45), indicating a subpleural lymph node. No pleural effusions. Subpleural fibrosis is present bilaterally, most prominent in the lingula. Central airways patent. Upper Abdomen: Small hiatal hernia. Prior cholecystectomy which accounts for the dilated proximal common bile duct. Scarring and a simple cyst is present in the upper pole of the visualized left kidney. Visualized upper abdomen otherwise unremarkable for the early phase of enhancement. Musculoskeletal: Lower cervical degenerative disc disease and spondylosis. Osseous demineralization. No acute abnormalities. Review of the MIP images confirms the above findings. IMPRESSION: 1. No evidence of pulmonary embolism. 2. Moderate cardiomegaly. LAD and right coronary artery atherosclerosis. 3. Severe atherosclerosis involving the thoracic and upper abdominal aorta without aneurysm. 4. Low lung volumes. Scattered areas of localized air trapping in both lungs as can be seen in patients with asthma or  bronchitis. Chronic lung changes as detailed above No acute cardiopulmonary disease otherwise. 5. Small hiatal hernia. Electronically Signed   By: Evangeline Dakin M.D.   On: 08/08/2016 20:47   Nm Gi Blood Loss  Result Date: 08/12/2016 CLINICAL DATA:  Gastrointestinal bleeding EXAM: NUCLEAR MEDICINE GASTROINTESTINAL BLEEDING SCAN TECHNIQUE: Sequential abdominal images were obtained following intravenous administration of Tc-34m labeled red blood cells. Images were obtained over a 2 hour time  span. RADIOPHARMACEUTICALS:  25.4 mCi Tc-75m in-vitro labeled red cells. COMPARISON:  CT abdomen and pelvis August 11, 2016 FINDINGS: Images were obtained over a 2 hour time span. During that time interval, there is no abnormal radiotracer uptake to suggest a focus of gastrointestinal bleeding. No abnormality appreciable. IMPRESSION: No abnormality appreciable. In particular, no demonstrable site of active gastroesophageal bleeding is demonstrated on this study. Electronically Signed   By: Lowella Grip III M.D.   On: 08/12/2016 13:33   Ir Angiogram Visceral Selective  Result Date: 08/14/2016 INDICATION: Lower GI bleed and migration of previously placed endoscopic biliary stent into proximal colon. Nuclear medicine bleeding scan yesterday did not show evidence of active bleeding and request has been made to perform arteriography due to persistent clinical bleeding. Colonoscopy demonstrates bright red blood in the colon without focal bleeding source demonstrated. EXAM: 1. ULTRASOUND GUIDANCE FOR VASCULAR ACCESS OF THE RIGHT COMMON FEMORAL ARTERY 2. SELECTIVE VISCERAL ARTERIOGRAPHY OF THE SUPERIOR MESENTERIC ARTERY 3. SELECTIVE ARTERIOGRAPHY OF LUMBAR ARTERY MEDICATIONS: 100 mcg intra-arterial nitroglycerin ANESTHESIA/SEDATION: Moderate (conscious) sedation was employed during this procedure. A total of Versed 1.5 mg and Fentanyl 25 mcg was administered intravenously. Moderate Sedation Time: 50 minutes. The patient's level of consciousness and vital signs were monitored continuously by radiology nursing throughout the procedure under my direct supervision. CONTRAST:  120 mL Isovue-300 FLUOROSCOPY TIME:  Fluoroscopy Time: 14 minutes 18 seconds. (171.6 mGy). COMPLICATIONS: None immediate. PROCEDURE: Informed consent was obtained from the patient's husband following explanation of the procedure, risks, benefits and alternatives. The patient's husband understands, agrees and consents for the procedure. All questions  were addressed. A time out was performed prior to the initiation of the procedure. Maximal barrier sterile technique utilized including caps, mask, sterile gowns, sterile gloves, large sterile drape, hand hygiene, and Betadine prep. The right common femoral artery was localized by ultrasound and patency confirmed by ultrasound. Access of the artery was performed under direct ultrasound guidance with a micropuncture set and ultrasound image documentation performed. After access of the right common femoral artery, a 5 French sheath was advanced over a guidewire. A 5 French Cobra catheter was then advanced into the abdominal aorta. This was used to selectively catheterize the superior mesenteric artery. The catheter was advanced into the trunk of the superior mesenteric artery and initial selective arteriography performed. Additional selective arteriography was then performed of the entire SMA territory after intra-arterial administration of 100 mcg nitroglycerin. Attempt to find the origin of the inferior mesenteric artery was then performed with the Cobra catheter. The Cobra catheter was then exchanged for a 5 [place] Sos catheter and attempts made to find and catheterize the inferior mesenteric artery origin. Arteriography of the distal aorta was performed through the Sos catheter. Eventually, a vessel was catheterized and selectively injected which corresponded to a hypertrophied lumbar artery. The Sos catheter was then removed over a guidewire. Hemostasis was obtained at the femoral access site with the Cordis Exoseal device. FINDINGS: Initial arteriography of the superior mesenteric artery shows relative vasoconstriction with no evidence of arterial bleeding or other  vascular abnormality. After administration of intra-arterial nitroglycerin, improved vasodilatation was present. Arteriography of the entire SMA supply demonstrates no evidence of active arterial bleeding, pseudoaneurysm, AV malformation or  angiodysplasia. An endoscopic plastic biliary stent is present in the ascending colon. No arterial bleeding is identified at the level of the stent. 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 An enlarged trunk was identified emanating off of the distal aorta which when catheterized represents a hypertrophied left-sided lumbar artery versus other retroperitoneal branch that supplies no bowel. IMPRESSION: No arterial source for gastrointestinal bleeding identified by arteriography of the superior mesenteric artery. No evidence of underlying arteriovenous malformation or angiodysplasia. At the level of a migrated endoscopic biliary stent within the ascending colon, no active bleeding is demonstrated. 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 Electronically Signed   By: Aletta Edouard M.D.   On: 08/14/2016 10:38   Ir Angio/spinal Left  Result Date: 08/14/2016 INDICATION: Lower GI bleed and migration of previously placed endoscopic biliary stent into proximal colon. Nuclear medicine bleeding scan yesterday did not show evidence of active bleeding and request has been made to perform arteriography due to persistent clinical bleeding. Colonoscopy demonstrates bright red blood in the colon without focal bleeding source demonstrated. EXAM: 1. ULTRASOUND GUIDANCE FOR VASCULAR ACCESS OF THE RIGHT COMMON FEMORAL ARTERY 2. SELECTIVE VISCERAL ARTERIOGRAPHY OF THE SUPERIOR MESENTERIC ARTERY 3. SELECTIVE ARTERIOGRAPHY OF LUMBAR ARTERY MEDICATIONS: 100 mcg intra-arterial nitroglycerin ANESTHESIA/SEDATION: Moderate (conscious) sedation was employed during this procedure. A total of Versed 1.5 mg and Fentanyl 25 mcg was administered intravenously. Moderate Sedation Time: 50 minutes. The patient's level of consciousness and vital signs were monitored continuously by radiology nursing  throughout the procedure under my direct supervision. CONTRAST:  120 mL Isovue-300 FLUOROSCOPY TIME:  Fluoroscopy Time: 14 minutes 18 seconds. (171.6 mGy). COMPLICATIONS: None immediate. PROCEDURE: Informed consent was obtained from the patient's husband following explanation of the procedure, risks, benefits and alternatives. The patient's husband understands, agrees and consents for the procedure. All questions were addressed. A time out was performed prior to the initiation of the procedure. Maximal barrier sterile technique utilized including caps, mask, sterile gowns, sterile gloves, large sterile drape, hand hygiene, and Betadine prep. The right common femoral artery was localized by ultrasound and patency confirmed by ultrasound. Access of the artery was performed under direct ultrasound guidance with a micropuncture set and ultrasound image documentation performed. After access of the right common femoral artery, a 5 French sheath was advanced over a guidewire. A 5 French Cobra catheter was then advanced into the abdominal aorta. This was used to selectively catheterize the superior mesenteric artery. The catheter was advanced into the trunk of the superior mesenteric artery and initial selective arteriography performed. Additional selective arteriography was then performed of the entire SMA territory after intra-arterial administration of 100 mcg nitroglycerin. Attempt to find the origin of the inferior mesenteric artery was then performed with the Cobra catheter. The Cobra catheter was then exchanged for a 5 [place] Sos catheter and attempts made to find and catheterize the inferior mesenteric artery origin. Arteriography of the distal aorta was performed through the Sos catheter. Eventually, a vessel was catheterized and selectively injected which corresponded to a hypertrophied lumbar artery. The Sos catheter was then removed over a guidewire. Hemostasis was obtained at the femoral access site with the  Cordis Exoseal device. FINDINGS: Initial  arteriography of the superior mesenteric artery shows relative vasoconstriction with no evidence of arterial bleeding or other vascular abnormality. After administration of intra-arterial nitroglycerin, improved vasodilatation was present. Arteriography of the entire SMA supply demonstrates no evidence of active arterial bleeding, pseudoaneurysm, AV malformation or angiodysplasia. An endoscopic plastic biliary stent is present in the ascending colon. No arterial bleeding is identified at the level of the stent. 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 An enlarged trunk was identified emanating off of the distal aorta which when catheterized represents a hypertrophied left-sided lumbar artery versus other retroperitoneal branch that supplies no bowel. IMPRESSION: No arterial source for gastrointestinal bleeding identified by arteriography of the superior mesenteric artery. No evidence of underlying arteriovenous malformation or angiodysplasia. At the level of a migrated endoscopic biliary stent within the ascending colon, no active bleeding is demonstrated. 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 Electronically Signed   By: Aletta Edouard M.D.   On: 08/14/2016 10:38   Ir US Guide Vasc Access Right  Result Date: 08/14/2016 INDICATION: Lower GI bleed and migration of previously placed endoscopic biliary stent into proximal colon. Nuclear medicine bleeding scan yesterday did not show evidence of active bleeding and request has been made to perform arteriography due to persistent clinical bleeding. Colonoscopy demonstrates bright red blood in the colon without focal bleeding source demonstrated. EXAM: 1. ULTRASOUND GUIDANCE FOR VASCULAR ACCESS OF THE RIGHT COMMON FEMORAL ARTERY 2. SELECTIVE VISCERAL ARTERIOGRAPHY OF THE SUPERIOR  MESENTERIC ARTERY 3. SELECTIVE ARTERIOGRAPHY OF LUMBAR ARTERY MEDICATIONS: 100 mcg intra-arterial nitroglycerin ANESTHESIA/SEDATION: Moderate (conscious) sedation was employed during this procedure. A total of Versed 1.5 mg and Fentanyl 25 mcg was administered intravenously. Moderate Sedation Time: 50 minutes. The patient's level of consciousness and vital signs were monitored continuously by radiology nursing throughout the procedure under my direct supervision. CONTRAST:  120 mL Isovue-300 FLUOROSCOPY TIME:  Fluoroscopy Time: 14 minutes 18 seconds. (171.6 mGy). COMPLICATIONS: None immediate. PROCEDURE: Informed consent was obtained from the patient's husband following explanation of the procedure, risks, benefits and alternatives. The patient's husband understands, agrees and consents for the procedure. All questions were addressed. A time out was performed prior to the initiation of the procedure. Maximal barrier sterile technique utilized including caps, mask, sterile gowns, sterile gloves, large sterile drape, hand hygiene, and Betadine prep. The right common femoral artery was localized by ultrasound and patency confirmed by ultrasound. Access of the artery was performed under direct ultrasound guidance with a micropuncture set and ultrasound image documentation performed. After access of the right common femoral artery, a 5 French sheath was advanced over a guidewire. A 5 French Cobra catheter was then advanced into the abdominal aorta. This was used to selectively catheterize the superior mesenteric artery. The catheter was advanced into the trunk of the superior mesenteric artery and initial selective arteriography performed. Additional selective arteriography was then performed of the entire SMA territory after intra-arterial administration of 100 mcg nitroglycerin. Attempt to find the origin of the inferior mesenteric artery was then performed with the Cobra catheter. The Cobra catheter was then exchanged  for a 5 [place] Sos catheter and attempts made to find and catheterize the inferior mesenteric artery origin. Arteriography of the distal aorta was performed through the Sos catheter. Eventually, a vessel was catheterized and selectively injected which corresponded to a hypertrophied lumbar artery. The Sos catheter was then  removed over a guidewire. Hemostasis was obtained at the femoral access site with the Cordis Exoseal device. FINDINGS: Initial arteriography of the superior mesenteric artery shows relative vasoconstriction with no evidence of arterial bleeding or other vascular abnormality. After administration of intra-arterial nitroglycerin, improved vasodilatation was present. Arteriography of the entire SMA supply demonstrates no evidence of active arterial bleeding, pseudoaneurysm, AV malformation or angiodysplasia. An endoscopic plastic biliary stent is present in the ascending colon. No arterial bleeding is identified at the level of the stent. 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 An enlarged trunk was identified emanating off of the distal aorta which when catheterized represents a hypertrophied left-sided lumbar artery versus other retroperitoneal branch that supplies no bowel. IMPRESSION: No arterial source for gastrointestinal bleeding identified by arteriography of the superior mesenteric artery. No evidence of underlying arteriovenous malformation or angiodysplasia. At the level of a migrated endoscopic biliary stent within the ascending colon, no active bleeding is demonstrated. 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 Electronically Signed   By: Aletta Edouard M.D.   On: 08/14/2016 10:38   Dg Chest Portable 1 View  Result Date: 08/08/2016 CLINICAL DATA:  Dyspnea and EXAM: PORTABLE CHEST 1 VIEW COMPARISON:  03/14/2016 FINDINGS: The heart size and  mediastinal contours are within normal limits. Aortic atherosclerosis is noted. No pneumonic consolidation, effusion or pneumothorax. Chronic subpleural interstitial prominence may reflect areas of fibrosis, scarring and/or chronic interstitial lung disease. Osteoarthritis of both glenohumeral joints with spurring and joint space narrowing. Slight remodeling along the undersurface of the distal acromion and clavicle on the left. This can be seen with chronic rotator cuff tear or rheumatoid arthritis. IMPRESSION: 1. Aortic atherosclerosis. 2. Mild stable interstitial prominence which may reflect subpleural fibrosis, scarring or sequela of chronic interstitial disease. 3. No acute pneumonic consolidation or CHF. Electronically Signed   By: Ashley Royalty M.D.   On: 08/08/2016 19:52   Dg Ercp  Result Date: 08/09/2016 CLINICAL DATA:  ERCP EXAM: ERCP TECHNIQUE: Multiple spot images obtained with the fluoroscopic device and submitted for interpretation post-procedure. COMPARISON:  CT abdomen pelvis - 08/08/2016 FINDINGS: 3 spot intraoperative fluoroscopic images of the right upper abdominal quadrant during ERCP are provided for review Initial image demonstrates an ERCP probe overlying the right upper abdominal quadrant. Post cholecystectomy. There is selective cannulation opacification of the common bile duct which appears markedly dilated compatible with the findings seen on preceding abdominal CT. There is no definitive opacification of the pancreatic or residual cystic ducts. There is no significant opacification of the intrahepatic biliary system. No definitive filling defects are seen with the opacified portions of the common bile duct to suggest the presence of choledocholithiasis. Completion image demonstrates placement of a internal biliary stent overlying the distal aspect of the common bile duct. IMPRESSION: ERCP with biliary stent placement as above. These images were submitted for radiologic interpretation  only. Please see the procedural report for the amount of contrast and the fluoroscopy time utilized. Electronically Signed   By: Sandi Mariscal M.D.   On: 08/09/2016 14:36   Dg Abd 2 Views  Result Date: 08/14/2016 CLINICAL DATA:  Images obtained for Displacement of biliary stent. No current abdominal complaints. EXAM: ABDOMEN - 2 VIEW COMPARISON:  CT abdomen 08/11/2016 FINDINGS: There is a biliary stent which is located in the right mid abdomen to the right of the L4  vertebral body in a transverse orientation concerning for displacement from the common bile duct. There is no bowel dilatation to suggest obstruction. There is no evidence of pneumoperitoneum, portal venous gas or pneumatosis. There are no pathologic calcifications along the expected course of the ureters. Left total hip arthroplasty. Levoscoliosis of the lumbar spine. Lumbar spine spondylosis. IMPRESSION: There is a biliary stent which is located in the right mid abdomen to the right of the L4 vertebral body in a transverse orientation concerning for displacement from the common bile duct. If there is further clinical concern regarding the exact location of the biliary stent, recommend evaluation with a CT of the abdomen. Electronically Signed   By: Kathreen Devoid   On: 08/14/2016 15:08   Dg Abd 2 Views  Result Date: 08/13/2016 CLINICAL DATA:  83 year old female with displaced biliary stent. Subsequent encounter. EXAM: ABDOMEN - 2 VIEW COMPARISON:  08/12/2016 plain film exam.  08/11/2016 CT. FINDINGS: Biliary stent has shifted position slightly now located within the right lower abdomen/ right upper pelvis. It is possible this is in the right colon. No findings of bowel obstruction or free intraperitoneal air. Gas within the biliary system felt to be related to stent placement. Degenerative changes lumbar spine convex left. Post left hip replacement. Post cholecystectomy. IMPRESSION: Biliary stent has shifted position slightly now located within the  right lower abdomen/ right upper pelvis. It is possible this is in the right colon. Electronically Signed   By: Genia Del M.D.   On: 08/13/2016 13:29   Dg Abd 2 Views  Result Date: 08/12/2016 CLINICAL DATA:  Displacement of biliary stent. EXAM: ABDOMEN - 2 VIEW COMPARISON:  CT of the abdomen and pelvis 08/11/2016 FINDINGS: The biliary stent is noted in the right-sided the abdomen, projecting over the ascending colon on all images. Levoconvex curvature is present in the lumbar spine. Atherosclerotic changes are noted. A partial left hip replacement is noted. IMPRESSION: The biliary stent projects over the right side of the abdomen, potentially within the ascending colon. Electronically Signed   By: San Morelle M.D.   On: 08/12/2016 21:30   US Abdomen Limited Ruq  Result Date: 08/10/2016 CLINICAL DATA:  Cholangitis. ERCP earlier this day with stent placement. EXAM: ULTRASOUND ABDOMEN LIMITED RIGHT UPPER QUADRANT COMPARISON:  Abdominal CT yesterday. FINDINGS: Gallbladder: Surgically absent. Common bile duct: Diameter: 17 mm at the porta hepatis. 2.2 x 2.5 cm distally. Biliary stent is visualized. Liver: No focal lesion identified. Linear echogenic shadowing is likely pneumobilia related to recent ERCP. Normal directional flow in the main portal vein. 2.6 cm cyst in the pancreatic head, as seen on CT. IMPRESSION: 1. Biliary dilatation with biliary stent visualized in the common bile duct. The degree of biliary dilatation appears similar to CT yesterday. 2. Linear echogenic shadowing structures in the liver likely pneumobilia. Electronically Signed   By: Jeb Levering M.D.   On: 08/10/2016 02:27    Signature  Lala Lund M.D on 08/15/2016 at 9:30 AM  Between 7am to 7pm - Pager - [phone] ( page via [email or web address], text pages only, please mention full 10 digit call back number).  After 7pm go to [email or web address] - password [email or web address]

## 2016-08-15 NOTE — Progress Notes (Signed)
[facility] ([place]) Gastroenterology Progress Note  LAHELA WOODIN 83 y.o. [birth year]  CC:  Cholangitis/ GI bleed   Subjective: Patient continues to have drop in hemoglobin. Denied abdominal pain.   ROS : Negative for chest pain or shortness of breath.   Objective: Vital signs in last 24 hours: Vitals:   08/15/16 0800 08/15/16 0916  BP: (!) 154/49 (!) 160/43  Pulse:  68  Resp: (!) 25 19  Temp:  98.1 F (36.7 C)    Physical Exam:  General:  Alert, cooperative, no distress, appears stated age  Head:  Normocephalic, without obvious abnormality, atraumatic  Eyes:  , EOM's intact,Mild scleral icterus   Lungs:   Clear to auscultation bilaterally, respirations unlabored. Anterior exam only   Heart:  Regular rate and rhythm, S1, S2 normal  Abdomen:   Abdomen soft,Right upper quadrant tenderness to palpation nondistended Bowel sounds present. No peritoneal sign.   Extremities: Extremities normal, atraumatic, no  edema       Lab Results:  Recent Labs  08/14/16 0727 08/15/16 0600  NA 143 139  K 5.4* 3.2*  CL 119* 116*  CO2 16* 18*  GLUCOSE 120* 93  BUN 20 8  CREATININE 1.06* 0.80  CALCIUM 8.1* 7.0*    Recent Labs  08/13/16 0031 08/15/16 0600  AST 23 18  ALT 133* 59*  ALKPHOS 92 58  BILITOT 1.1 0.8  PROT 3.7* 3.4*  ALBUMIN 1.8* 1.8*    Recent Labs  08/13/16 1833  08/14/16 0727  08/15/16 0013 08/15/16 0600  WBC 10.7*  --  9.2  --   --  6.6  NEUTROABS 6.1  --   --   --   --   --   HGB 7.9*  < > 8.4*  < > 8.5* 8.1*  HCT 23.2*  < > 25.1*  < > 24.9* 23.7*  MCV 86.2  --  89.3  --   --  87.5  PLT 121*  --  112*  --   --  93*  < > = values in this interval not displayed. No results for input(s): LABPROT, INR in the last 72 hours.    Assessment/Plan: - GI bleed with Initial dark stools but now right red blood per rectum. Negative EGD for active bleeding yesterday.  Ascending cholangitis. Status post ERCP with sphincterotomy,  removal of pus and plastic stent placement. LFTs  trending down.  - Displacement of the previously placed biliary stent. X-ray showed stent possibly in ascending colon - Gram-negative bacteremia - Sepsis. - Acute kidney injury. Improving  Recommendations ------------------------- - Colonoscopy today for further evaluation. Risk benefits alternatives discussed with the patient and patient's daughter. Verbalized understanding. - GI bleeding scan negative 08/12/2016. Flexible sigmoidoscopy yesterday showed clotted and fresh blood but not able to identify bleeding source. Negative angiogram yesterday.  - GI will follow   Otis Brace MD, [place] 08/15/2016, 9:51 AM  Pager [phone]  If no answer or after 5 PM call [phone]

## 2016-08-15 NOTE — Transfer of Care (Signed)
Immediate Anesthesia Transfer of Care Note  Patient: [name]  Procedure(s) Performed: Procedure(s): COLONOSCOPY WITH PROPOFOL (N/A)  Patient Location: PACU and Endoscopy Unit  Anesthesia Type:MAC  Level of Consciousness: awake, alert , oriented and patient cooperative  Airway & Oxygen Therapy: Patient Spontanous Breathing and Patient connected to face mask oxygen  Post-op Assessment: Report given to RN and Post -op Vital signs reviewed and stable  Post vital signs: Reviewed and stable  Last Vitals:  Vitals:   08/15/16 0800 08/15/16 0916  BP: (!) 154/49 (!) 160/43  Pulse:  68  Resp: (!) 25 19  Temp:  36.7 C    Last Pain:  Vitals:   08/15/16 0916  TempSrc: Oral  PainSc:       Patients Stated Pain Goal: 2 (10/93/23 5573)  Complications: No apparent anesthesia complications

## 2016-08-16 LAB — BASIC METABOLIC PANEL
Anion gap: 3 — ABNORMAL LOW (ref 5–15)
BUN: 6 mg/dL (ref 6–20)
CHLORIDE: 118 mmol/L — AB (ref 101–111)
CO2: 20 mmol/L — ABNORMAL LOW (ref 22–32)
Calcium: 7.7 mg/dL — ABNORMAL LOW (ref 8.9–10.3)
Creatinine, Ser: 0.76 mg/dL (ref 0.44–1.00)
Glucose, Bld: 132 mg/dL — ABNORMAL HIGH (ref 65–99)
POTASSIUM: 4.5 mmol/L (ref 3.5–5.1)
SODIUM: 141 mmol/L (ref 135–145)

## 2016-08-16 LAB — HEMOGLOBIN AND HEMATOCRIT, BLOOD
HCT: 24 % — ABNORMAL LOW (ref 36.0–46.0)
HCT: 27.6 % — ABNORMAL LOW (ref 36.0–46.0)
HCT: 26.3 % — ABNORMAL LOW (ref 36.0–46.0)
Hemoglobin: 7.9 g/dL — ABNORMAL LOW (ref 12.0–15.0)
Hemoglobin: 8.9 g/dL — ABNORMAL LOW (ref 12.0–15.0)
Hemoglobin: 8.6 g/dL — ABNORMAL LOW (ref 12.0–15.0)

## 2016-08-16 LAB — GLUCOSE, CAPILLARY
GLUCOSE-CAPILLARY: 128 mg/dL — AB (ref 65–99)
GLUCOSE-CAPILLARY: 158 mg/dL — AB (ref 65–99)
Glucose-Capillary: 131 mg/dL — ABNORMAL HIGH (ref 65–99)
Glucose-Capillary: 156 mg/dL — ABNORMAL HIGH (ref 65–99)
Glucose-Capillary: 139 mg/dL — ABNORMAL HIGH (ref 65–99)

## 2016-08-16 NOTE — Anesthesia Postprocedure Evaluation (Signed)
Anesthesia Post Note  Patient: [name]  Procedure(s) Performed: Procedure(s) (LRB): COLONOSCOPY WITH PROPOFOL (N/A)     Patient location during evaluation: PACU Anesthesia Type: MAC Level of consciousness: awake and alert Pain management: pain level controlled Vital Signs Assessment: post-procedure vital signs reviewed and stable Respiratory status: spontaneous breathing, nonlabored ventilation, respiratory function stable and patient connected to nasal cannula oxygen Cardiovascular status: stable and blood pressure returned to baseline Anesthetic complications: no    Last Vitals:  Vitals:   08/16/16 0714 08/16/16 0900  BP: (!) 148/53 (!) 136/46  Pulse: 75 75  Resp: 16 (!) 23  Temp: 37.1 C     Last Pain:  Vitals:   08/16/16 0953  TempSrc:   PainSc: 0-No pain                 Emerita Berkemeier S

## 2016-08-16 NOTE — Progress Notes (Signed)
PROGRESS NOTE                                                                                                                                                                                                             Patient Demographics:    [name], is a 83 y.o. female, DOB - 1929/04/13, TIW:[id number]  Admit date - 08/08/2016   Admitting Physician Edwin Dada, MD  Outpatient Primary MD for the patient is Lavone Orn, MD  LOS - 7   Chief Complaint  Patient presents with  . Chest Pain       Brief Narrative    83 y.o.WF PMHx Bipolar disorder, HTN, DM, CKD stage III ( baseline Cr 1.1), CHF, and chronic pain (interstitial cystitis and chronic back pain)Who presentedwith fever and epigastric pain. She was found to have cholangitis, status post ERCP 08/09/2016, patient started to have dark bloody bowel movement 08/11/2016, will she has been transfused total of 4 units PRBC, repeat endoscopy 6/10 with no evidence of active GI bleed, as well with negative bleeding scan, she continues to have active bleeding, Flexible sigmoidoscopy with evidence of fresh and old blood, but could not identify source, went for angiogram by IR which could not identify source of bleed as well, continues requiring frequent transfusion with active GI bleed.    Subjective:   Patient in bed, appears comfortable, denies any headache, no fever, no chest pain or pressure, no shortness of breath , no abdominal pain. No focal weakness.   Assessment  & Plan :    Sepsis due to Klebsiella bacteremia/Cholangitis And transaminitis  Clinically improved from this standpoint, she is status post EGD with CBD stent placement, CBD stent post procedure has migrated and eventually was removed with colonoscopy on 08/15/2016, patient's LFTs have normalized and she is now symptom-free. Clinically this problem has resolved. GI on board.  Klebsiella pneumoniae Bacteremia - initially was on  IV Zosyn now on IV Rocephin, antibiotics started on 08/08/2016, we stop all antibiotics tomorrow. Infection has clearly defervesced.   GI bleed with Acute blood loss anemia - EGD was unremarkable and there was a cecal polyp which was bleeding and clipped via colonoscopy by GI on 08/15/2016, H&H is dropped again, we'll continue to monitor may require more transfusions, also general surgery on board. Patient adamant she does not want surgical  intervention even if she dies, discussed with daughter she understands the plan and will talk to her mother and let us know about the final decision for surgery if needed.  Elevated troponin - This was demand ischemia due to anemia as above, troponin trend was flat and in non-ACS pattern, cannot get aspirin due to GI bleed, repeat echocardiogram done this admission shows EF of 55-60% with no regional wall motion abnormality. Continue to follow. Patient chest pain-free.  CKD stage III (Baseline creatinine 1.1)  Creatinine close to baseline.  Chronic diastolic congestive heart failure - Euvolemic and compensated  Essential HTN - Currently blood pressure acceptable, amlodipine has been stopped secondary to GI bleed, continue with metoprolol, and when necessary hydralazine .  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 Replaced.   CBG (last 3)   Recent Labs  08/15/16 2342 08/16/16 0443 08/16/16 0844  GLUCAP 160* 131* 128*     Bipolar disorder -Lithium 150 mg daily, Remeron 30 mg daily  MRSA screen positive     Code Status : DO NOT RESUSCITATE  Family Communication  : None at bedside, Called her daughter [name]   Disposition Plan  : Pending further workup  Barriers For Discharge : Still with active GI bleeding  Consults  :  GI  Procedures  :   6/7 ERCP - diffusely dilated CBD (2cm) w/ no visible filling defect - sphincterotomy performed leading to drainage  of pus - CBD stent placed plastic CBD stent 5 cm 7 French  6/10 repeat EGD with no evidence of active GI bleed 6/11 Mesenteric Angiography  by Dr. Kathlene Cote 6/13 Colonoscopy - GI bleed with Initial dark stools but now right red blood per rectum. Negative EGD for active bleeding yesterday.       DVT Prophylaxis  :  SCDs   Lab Results  Component Value Date   PLT 93 (L) 08/15/2016    Antibiotics  :    Anti-infectives    Start     Dose/Rate Route Frequency Ordered Stop   08/13/16 2200  piperacillin-tazobactam (ZOSYN) IVPB 3.375 g  Status:  Discontinued     3.375 g 12.5 mL/hr over 240 Minutes Intravenous Every 8 hours 08/12/16 2003 08/13/16 0705   08/13/16 0800  cefTRIAXone (ROCEPHIN) 2 g in dextrose 5 % 50 mL IVPB     2 g 100 mL/hr over 30 Minutes Intravenous Every 24 hours 08/13/16 0705     08/09/16 2000  vancomycin (VANCOCIN) 500 mg in sodium chloride 0.9 % 100 mL IVPB  Status:  Discontinued     500 mg 100 mL/hr over 60 Minutes Intravenous Every 24 hours 08/08/16 1958 08/09/16 0125   08/09/16 0400  piperacillin-tazobactam (ZOSYN) IVPB 3.375 g  Status:  Discontinued     3.375 g 12.5 mL/hr over 240 Minutes Intravenous Every 8 hours 08/08/16 1958 08/12/16 2003   08/08/16 1930  piperacillin-tazobactam (ZOSYN) IVPB 3.375 g     3.375 g 100 mL/hr over 30 Minutes Intravenous  Once 08/08/16 1920 08/08/16 2104   08/08/16 1930  vancomycin (VANCOCIN) IVPB 1000 mg/200 mL premix     1,000 mg 200 mL/hr over 60 Minutes Intravenous  Once 08/08/16 1920 08/08/16 2116        Objective:   Vitals:   08/15/16 2200 08/15/16 2312 08/16/16 0300 08/16/16 0714  BP: (!) 136/48 (!) 127/45 (!) 135/93 (!) 148/53  Pulse: 72 67 65 75  Resp: 16 16 19  16  Temp:  98.6 F (37 C) 99 F (37.2 C) 98.8 F (37.1 C)  TempSrc:  Oral Oral Oral  SpO2: 99% 100% 98% 100%  Weight:      Height:        Wt Readings from Last 3 Encounters:  08/10/16 67.8 kg (149 lb 7.6 oz)  03/13/16 61.4 kg (135 lb 4.8 oz)    01/05/16 64.6 kg (142 lb 6.7 oz)     Intake/Output Summary (Last 24 hours) at 08/16/16 0938 Last data filed at 08/15/16 1752  Gross per 24 hour  Intake          1154.83 ml  Output              475 ml  Net           679.83 ml     Physical Exam  Awake Alert, Oriented X 3, No new F.N deficits, Normal affect Sundown.AT,PERRAL Supple Neck,No JVD, No cervical lymphadenopathy appriciated.  Symmetrical Chest wall movement, Good air movement bilaterally, CTAB RRR,No Gallops,Rubs or new Murmurs, No Parasternal Heave +ve B.Sounds, Abd Soft, No tenderness, No organomegaly appriciated, No rebound - guarding or rigidity. No Cyanosis, Clubbing or edema, No new Rash or bruise    Data Review:    CBC  Recent Labs Lab 08/12/16 1833 08/13/16 0031  08/13/16 1833  08/14/16 0727  08/15/16 0013 08/15/16 0600 08/15/16 1200 08/15/16 1848 08/16/16 0456  WBC 8.2 7.4  --  10.7*  --  9.2  --   --  6.6  --   --   --   HGB 9.9* 7.0*  < > 7.9*  < > 8.4*  < > 8.5* 8.1* 9.5* 9.3* 7.9*  HCT 30.3* 21.2*  < > 23.2*  < > 25.1*  < > 24.9* 23.7* 28.1* 28.0* 24.0*  PLT 112* 102*  --  121*  --  112*  --   --  93*  --   --   --   MCV 86.6 86.5  --  86.2  --  89.3  --   --  87.5  --   --   --   MCH 28.3 28.6  --  29.4  --  29.9  --   --  29.9  --   --   --   MCHC 32.7 33.0  --  34.1  --  33.5  --   --  34.2  --   --   --   RDW 16.6* 16.5*  --  15.0  --  15.4  --   --  15.5  --   --   --   LYMPHSABS  --   --   --  3.3  --   --   --   --   --   --   --   --   MONOABS  --   --   --  1.1*  --   --   --   --   --   --   --   --   EOSABS  --   --   --  0.1  --   --   --   --   --   --   --   --   BASOSABS  --   --   --  0.1  --   --   --   --   --   --   --   --   < > =  values in this interval not displayed.  Chemistries   Recent Labs Lab 08/10/16 0429 08/11/16 0758 08/12/16 0150 08/13/16 0031 08/14/16 0727 08/15/16 0600 08/16/16 0458  NA 140 144 141 141 143 139 141  K 3.6 4.0 4.3 4.7 5.4* 3.2* 4.5  CL  106 110 112* 114* 119* 116* 118*  CO2 25 25 24 24  16* 18* 20*  GLUCOSE 140* 152* 150* 123* 120* 93 132*  BUN 27* 19 18 19 20 8 6   CREATININE 1.40* 1.13* 1.04* 1.02* 1.06* 0.80 0.76  CALCIUM 8.3* 8.9 7.8* 7.5* 8.1* 7.0* 7.7*  AST 392* 120* 41 23  --  18  --   ALT 775* 476* 230* 133*  --  59*  --   ALKPHOS 254* 236* 134* 92  --  58  --   BILITOT 3.9* 3.2* 2.0* 1.1  --  0.8  --    ------------------------------------------------------------------------------------------------------------------ No results for input(s): CHOL, HDL, LDLCALC, TRIG, CHOLHDL, LDLDIRECT in the last 72 hours.  Lab Results  Component Value Date   HGBA1C 6.2 (H) 08/10/2016   ------------------------------------------------------------------------------------------------------------------ No results for input(s): TSH, T4TOTAL, T3FREE, THYROIDAB in the last 72 hours.  Invalid input(s): FREET3 ------------------------------------------------------------------------------------------------------------------ No results for input(s): VITAMINB12, FOLATE, FERRITIN, TIBC, IRON, RETICCTPCT in the last 72 hours.  Coagulation profile No results for input(s): INR, PROTIME in the last 168 hours.  No results for input(s): DDIMER in the last 72 hours.  Cardiac Enzymes  Recent Labs Lab 08/09/16 1522 08/10/16 0429  TROPONINI 0.49* 0.33*   ------------------------------------------------------------------------------------------------------------------    Component Value Date/Time   BNP 35.2 08/08/2016 1845    Inpatient Medications  Scheduled Meds: . atorvastatin  5 mg Oral Daily  . feeding supplement (ENSURE ENLIVE)  237 mL Oral BID BM  . FLUoxetine  20 mg Oral Daily  . insulin aspart  0-9 Units Subcutaneous Q4H  . lithium carbonate  150 mg Oral Daily  . LORazepam  0.5 mg Intravenous Once  . metoprolol tartrate  12.5 mg Oral BID  . mirtazapine  30 mg Oral QHS  . oxybutynin  5 mg Oral BID  . pantoprazole  (PROTONIX) IV  40 mg Intravenous Q12H  . polyethylene glycol  17 g Oral Daily  . sodium chloride flush  10-40 mL Intracatheter Q12H  . Vilazodone HCl  20 mg Oral Daily  . zolpidem  2.5 mg Oral QHS   Continuous Infusions: . sodium chloride 75 mL/hr at 08/15/16 1600  . sodium chloride    . sodium chloride    . sodium chloride    . cefTRIAXone (ROCEPHIN)  IV Stopped (08/15/16 1229)   PRN Meds:.furosemide, hydrALAZINE, ondansetron **OR** ondansetron (ZOFRAN) IV, oxyCODONE, sodium chloride flush  Micro Results Recent Results (from the past 240 hour(s))  Blood Culture (routine x 2)     Status: Abnormal   Collection Time: 08/08/16  7:20 PM  Result Value Ref Range Status   Specimen Description BLOOD RIGHT WRIST  Final   Special Requests   Final    BOTTLES DRAWN AEROBIC AND ANAEROBIC Blood Culture adequate volume   Culture  Setup Time   Final    GRAM NEGATIVE RODS IN BOTH AEROBIC AND ANAEROBIC BOTTLES CRITICAL VALUE NOTED.  VALUE IS CONSISTENT WITH PREVIOUSLY REPORTED AND CALLED VALUE.    Culture (A)  Final    KLEBSIELLA PNEUMONIAE SUSCEPTIBILITIES PERFORMED ON PREVIOUS CULTURE WITHIN THE LAST 5 DAYS.    Report Status 08/11/2016 FINAL  Final  Blood Culture (routine x 2)     Status:  Abnormal   Collection Time: 08/08/16  8:01 PM  Result Value Ref Range Status   Specimen Description BLOOD LEFT ANTECUBITAL  Final   Special Requests IN PEDIATRIC BOTTLE Blood Culture adequate volume  Final   Culture  Setup Time   Final    GRAM NEGATIVE RODS IN PEDIATRIC BOTTLE Organism ID to follow CRITICAL RESULT CALLED TO, READ BACK BY AND VERIFIED WITH: Hughie Closs Pharm.D. 10:15 08/09/16 ([email or web address])    Culture KLEBSIELLA PNEUMONIAE (A)  Final   Report Status 08/11/2016 FINAL  Final   Organism ID, Bacteria KLEBSIELLA PNEUMONIAE  Final      Susceptibility   Klebsiella pneumoniae - MIC*    AMPICILLIN >=32 RESISTANT Resistant     CEFAZOLIN <=4 SENSITIVE Sensitive     CEFEPIME <=1 SENSITIVE Sensitive       CEFTAZIDIME <=1 SENSITIVE Sensitive     CEFTRIAXONE <=1 SENSITIVE Sensitive     CIPROFLOXACIN <=0.25 SENSITIVE Sensitive     GENTAMICIN <=1 SENSITIVE Sensitive     IMIPENEM <=0.25 SENSITIVE Sensitive     TRIMETH/SULFA <=20 SENSITIVE Sensitive     AMPICILLIN/SULBACTAM 4 SENSITIVE Sensitive     PIP/TAZO <=4 SENSITIVE Sensitive     Extended ESBL NEGATIVE Sensitive     * KLEBSIELLA PNEUMONIAE  Blood Culture ID Panel (Reflexed)     Status: Abnormal   Collection Time: 08/08/16  8:01 PM  Result Value Ref Range Status   Enterococcus species NOT DETECTED NOT DETECTED Final   Listeria monocytogenes NOT DETECTED NOT DETECTED Final   Staphylococcus species NOT DETECTED NOT DETECTED Final   Staphylococcus aureus NOT DETECTED NOT DETECTED Final   Streptococcus species NOT DETECTED NOT DETECTED Final   Streptococcus agalactiae NOT DETECTED NOT DETECTED Final   Streptococcus pneumoniae NOT DETECTED NOT DETECTED Final   Streptococcus pyogenes NOT DETECTED NOT DETECTED Final   Acinetobacter baumannii NOT DETECTED NOT DETECTED Final   Enterobacteriaceae species DETECTED (A) NOT DETECTED Final    Comment: Enterobacteriaceae represent a large family of gram-negative bacteria, not a single organism. CRITICAL RESULT CALLED TO, READ BACK BY AND VERIFIED WITH: Hughie Closs Pharm.D. 10:15 08/09/16 ([email or web address])    Enterobacter cloacae complex NOT DETECTED NOT DETECTED Final   Escherichia coli NOT DETECTED NOT DETECTED Final   Klebsiella oxytoca NOT DETECTED NOT DETECTED Final   Klebsiella pneumoniae DETECTED (A) NOT DETECTED Final    Comment: CRITICAL RESULT CALLED TO, READ BACK BY AND VERIFIED WITH: Hughie Closs Pharm.D. 10:15 08/09/16 ([email or web address])    Proteus species NOT DETECTED NOT DETECTED Final   Serratia marcescens NOT DETECTED NOT DETECTED Final   Carbapenem resistance NOT DETECTED NOT DETECTED Final   Haemophilus influenzae NOT DETECTED NOT DETECTED Final   Neisseria meningitidis NOT DETECTED NOT DETECTED  Final   Pseudomonas aeruginosa NOT DETECTED NOT DETECTED Final   Candida albicans NOT DETECTED NOT DETECTED Final   Candida glabrata NOT DETECTED NOT DETECTED Final   Candida krusei NOT DETECTED NOT DETECTED Final   Candida parapsilosis NOT DETECTED NOT DETECTED Final   Candida tropicalis NOT DETECTED NOT DETECTED Final  Urine culture     Status: Abnormal   Collection Time: 08/08/16  8:04 PM  Result Value Ref Range Status   Specimen Description URINE, RANDOM  Final   Special Requests NONE  Final   Culture MULTIPLE SPECIES PRESENT, SUGGEST RECOLLECTION (A)  Final   Report Status 08/10/2016 FINAL  Final  MRSA PCR Screening     Status: Abnormal   Collection Time: 08/09/16  4:07 AM  Result Value Ref Range Status   MRSA by PCR POSITIVE (A) NEGATIVE Final    Comment:        The GeneXpert MRSA Assay (FDA approved for NASAL specimens only), is one component of a comprehensive MRSA colonization surveillance program. It is not intended to diagnose MRSA infection nor to guide or monitor treatment for MRSA infections. RESULT CALLED TO, READ BACK BY AND VERIFIED WITH: [facility] RN AT 8850 08/09/16 BY A.DAVIS     Radiology Reports Ct Abdomen Pelvis Wo Contrast  Result Date: 08/11/2016 CLINICAL DATA:  Worsening right-sided pain and low-grade fever in a patient that is status post cholecystectomy. Chronic kidney disease. Diabetes. Endoscopic ultrasound 02/23/2015. ERCP 2 days ago. Cholecystectomy 12/21/2015. EXAM: CT ABDOMEN AND PELVIS WITHOUT CONTRAST TECHNIQUE: Multidetector CT imaging of the abdomen and pelvis was performed following the standard protocol without IV contrast. COMPARISON:  Ultrasound of 08/09/2016. CT of 08/08/2016. ERCP of 08/09/2016. FINDINGS: Lower chest: Clear lung bases. Cardiomegaly with small bilateral pleural effusions. Hepatobiliary: No focal liver lesion. Cholecystectomy. Moderate intrahepatic biliary duct dilatation persists. Pneumobilia is new. Interval placement of a  distal common duct stent, which terminates in the descending duodenum Pancreas: Pancreatic atrophy. No duct dilatation. Cystic lesion in the pancreatic head measures 2.5 cm on image 26/ series 3 and is similar to on the prior. No evidence of acute pancreatitis. Spleen: Normal in size, without focal abnormality. Adrenals/Urinary Tract: Normal adrenal glands. Mild renal cortical thinning bilaterally. low-density left renal lesions are likely cysts. No hydronephrosis. Degraded evaluation of the pelvis, secondary to beam hardening artifact from left hip arthroplasty. Foley catheter in the urinary bladder. Stomach/Bowel: Normal stomach, without wall thickening. Scattered colonic diverticula. Normal terminal ileum. Duodenal diverticulum. Otherwise normal small bowel. Vascular/Lymphatic: Aortic and branch vessel atherosclerosis. No retroperitoneal or retrocrural adenopathy. Reproductive: Uterus and adnexa not well evaluated. Other: Trace pelvic fluid, including on image 72/series 3. No free intraperitoneal air. No abdominal ascites. Musculoskeletal: Left hip arthroplasty. Osteopenia. Convex left lumbar spine curvature. Advanced lumbosacral spondylosis. Minimal superior endplate compression deformity at L1 is unchanged. IMPRESSION: 1. Interval placement of a distal common duct stent. Persistent moderate intrahepatic biliary duct dilatation with new pneumobilia. Common duct not well evaluated. 2. No explanation for abdominal pain. 3. Degraded evaluation of the pelvis, secondary to beam hardening artifact from left hip arthroplasty. Trace cul-de-sac fluid is identified. 4.  Aortic atherosclerosis. 5. Small bilateral pleural effusions. 6. Similar size of a pancreatic head cystic lesion. Electronically Signed   By: Abigail Miyamoto M.D.   On: 08/11/2016 15:34   Ct Abdomen Pelvis Wo Contrast  Result Date: 08/09/2016 CLINICAL DATA:  Dyspnea and nausea starting today. Elevated lactic acid levels. EXAM: CT ABDOMEN AND PELVIS WITHOUT  CONTRAST TECHNIQUE: Multidetector CT imaging of the abdomen and pelvis was performed following the standard protocol without IV contrast. COMPARISON:  01/03/2016 CT and chest CT performed earlier on the same day FINDINGS: Lower chest: Borderline cardiomegaly with bibasilar atelectasis and/or scarring. No pericardial effusion. There is coronary arteriosclerosis. Hepatobiliary: Status post cholecystectomy with chronic stable dilatation of the intra and extrahepatic biliary ducts without calculus noted. The common bile duct is slightly more dilated on current exam up to 2.1 cm at the pancreatic head versus 1.7 cm in 2017. No mural thickening of the bowel ducts is identified. Lack of IV contrast limits assessment for mucosal enhancement. Patient reportedly received IV contrast earlier for a chest CT. On that exam, no definite mucosal enhancement is noted. Pancreas: Stable 2.7 cm cystic lesion in  the proximal body of the pancreas. No ductal dilatation is noted. Spleen: Normal in size without focal abnormality. Adrenals/Urinary Tract: Normal bilateral adrenal glands. Small left renal cysts are again visualized. Symmetric pyelograms noted bilaterally status post prior CT administration for the chest. No obstructive uropathy. Contrast opacified urine distended bladder. Stomach/Bowel: All the stomach and small intestine are normal. There is a moderate amount of fecal retention throughout large bowel with circular muscle hypertrophy and extensive sigmoid diverticulosis noted similar in appearance to prior. The appendix is not visualized but no pericecal inflammation is noted. Vascular/Lymphatic: Diffuse atherosclerosis of the abdominal aorta and branch vessels without aneurysm. No retroperitoneal or pelvic sidewall adenopathy. No inguinal lymphadenopathy. Reproductive: Uterus and bilateral adnexa are unremarkable. Other: No abdominal wall hernia or abnormality. No abdominopelvic ascites. Musculoskeletal: Thoracolumbar  spondylosis with multilevel degenerative disc disease along the lumbar spine. Osteopenia is noted. No acute fracture nor bone destruction. IMPRESSION: 1. Coronary arteriosclerosis and aortic atherosclerosis. 2. Extensive sigmoid diverticulosis without acute diverticulitis. 3. Status post cholecystectomy with chronic intrahepatic and extrahepatic ductal dilatation without mural thickening or enhancement seen on recent same day CT with IV contrast. No conclusive findings of cholangitis. 4. Stable 2.7 cm pancreatic head cyst and small left-sided renal cysts. 5. Spondylosis of the lumbar spine. Electronically Signed   By: Ashley Royalty M.D.   On: 08/09/2016 00:04   Ct Angio Chest Pe W Or Wo Contrast  Result Date: 08/08/2016 CLINICAL DATA:  83 year old with acute onset of chest pain, shortness of breath and nausea and vomiting that began earlier today. EXAM: CT ANGIOGRAPHY CHEST WITH CONTRAST TECHNIQUE: Multidetector CT imaging of the chest was performed using the standard protocol during bolus administration of intravenous contrast. Multiplanar CT image reconstructions and MIPs were obtained to evaluate the vascular anatomy. CONTRAST:  100 mL Isovue 370 IV. COMPARISON:  Unenhanced CT chest 01/13/2015. FINDINGS: Beam hardening streak artifact is present as the patient was unable to raise the arms over the head. Respiratory motion blurred many of the images. Cardiovascular: Contrast opacification of the pulmonary arteries is very good. No filling defects within either main pulmonary artery or their segmental branches in either lung to suggest pulmonary embolism. Heart moderately enlarged with left ventricular predominance. Severe LAD and right coronary artery atherosclerosis. No pericardial effusion. Severe atherosclerosis involving the thoracic and upper abdominal aorta without evidence of aneurysm or dissection. Gas is present within the right subclavian vein and the lower right internal jugular vein, possibly related  to an intravenous injection in the right upper extremity. Mediastinum/Nodes: Scattered normal sized mediastinal lymph nodes. No pathologically enlarged mediastinal, hilar or axillary lymph nodes. No mediastinal masses. Normal-appearing esophagus. Visualized thyroid gland unremarkable. Lungs/Pleura: Low lung volumes. Scattered areas of localized hyperlucency in both lungs indicating focal air trapping. Scarring in the lingula and in the lower lobes. No confluent airspace consolidation. No significant pulmonary parenchymal nodules or masses, as there is a stable subpleural nodule adjacent to the right major fissure (series 7, image 45), indicating a subpleural lymph node. No pleural effusions. Subpleural fibrosis is present bilaterally, most prominent in the lingula. Central airways patent. Upper Abdomen: Small hiatal hernia. Prior cholecystectomy which accounts for the dilated proximal common bile duct. Scarring and a simple cyst is present in the upper pole of the visualized left kidney. Visualized upper abdomen otherwise unremarkable for the early phase of enhancement. Musculoskeletal: Lower cervical degenerative disc disease and spondylosis. Osseous demineralization. No acute abnormalities. Review of the MIP images confirms the above findings. IMPRESSION: 1. No evidence  of pulmonary embolism. 2. Moderate cardiomegaly. LAD and right coronary artery atherosclerosis. 3. Severe atherosclerosis involving the thoracic and upper abdominal aorta without aneurysm. 4. Low lung volumes. Scattered areas of localized air trapping in both lungs as can be seen in patients with asthma or bronchitis. Chronic lung changes as detailed above No acute cardiopulmonary disease otherwise. 5. Small hiatal hernia. Electronically Signed   By: Evangeline Dakin M.D.   On: 08/08/2016 20:47   Nm Gi Blood Loss  Result Date: 08/12/2016 CLINICAL DATA:  Gastrointestinal bleeding EXAM: NUCLEAR MEDICINE GASTROINTESTINAL BLEEDING SCAN TECHNIQUE:  Sequential abdominal images were obtained following intravenous administration of Tc-40m labeled red blood cells. Images were obtained over a 2 hour time span. RADIOPHARMACEUTICALS:  25.4 mCi Tc-4m in-vitro labeled red cells. COMPARISON:  CT abdomen and pelvis August 11, 2016 FINDINGS: Images were obtained over a 2 hour time span. During that time interval, there is no abnormal radiotracer uptake to suggest a focus of gastrointestinal bleeding. No abnormality appreciable. IMPRESSION: No abnormality appreciable. In particular, no demonstrable site of active gastroesophageal bleeding is demonstrated on this study. Electronically Signed   By: Lowella Grip III M.D.   On: 08/12/2016 13:33   Ir Angiogram Visceral Selective  Result Date: 08/14/2016 INDICATION: Lower GI bleed and migration of previously placed endoscopic biliary stent into proximal colon. Nuclear medicine bleeding scan yesterday did not show evidence of active bleeding and request has been made to perform arteriography due to persistent clinical bleeding. Colonoscopy demonstrates bright red blood in the colon without focal bleeding source demonstrated. EXAM: 1. ULTRASOUND GUIDANCE FOR VASCULAR ACCESS OF THE RIGHT COMMON FEMORAL ARTERY 2. SELECTIVE VISCERAL ARTERIOGRAPHY OF THE SUPERIOR MESENTERIC ARTERY 3. SELECTIVE ARTERIOGRAPHY OF LUMBAR ARTERY MEDICATIONS: 100 mcg intra-arterial nitroglycerin ANESTHESIA/SEDATION: Moderate (conscious) sedation was employed during this procedure. A total of Versed 1.5 mg and Fentanyl 25 mcg was administered intravenously. Moderate Sedation Time: 50 minutes. The patient's level of consciousness and vital signs were monitored continuously by radiology nursing throughout the procedure under my direct supervision. CONTRAST:  120 mL Isovue-300 FLUOROSCOPY TIME:  Fluoroscopy Time: 14 minutes 18 seconds. (171.6 mGy). COMPLICATIONS: None immediate. PROCEDURE: Informed consent was obtained from the patient's husband  following explanation of the procedure, risks, benefits and alternatives. The patient's husband understands, agrees and consents for the procedure. All questions were addressed. A time out was performed prior to the initiation of the procedure. Maximal barrier sterile technique utilized including caps, mask, sterile gowns, sterile gloves, large sterile drape, hand hygiene, and Betadine prep. The right common femoral artery was localized by ultrasound and patency confirmed by ultrasound. Access of the artery was performed under direct ultrasound guidance with a micropuncture set and ultrasound image documentation performed. After access of the right common femoral artery, a 5 French sheath was advanced over a guidewire. A 5 French Cobra catheter was then advanced into the abdominal aorta. This was used to selectively catheterize the superior mesenteric artery. The catheter was advanced into the trunk of the superior mesenteric artery and initial selective arteriography performed. Additional selective arteriography was then performed of the entire SMA territory after intra-arterial administration of 100 mcg nitroglycerin. Attempt to find the origin of the inferior mesenteric artery was then performed with the Cobra catheter. The Cobra catheter was then exchanged for a 5 [place] Sos catheter and attempts made to find and catheterize the inferior mesenteric artery origin. Arteriography of the distal aorta was performed through the Sos catheter. Eventually, a vessel was catheterized and selectively injected which corresponded  to a hypertrophied lumbar artery. The Sos catheter was then removed over a guidewire. Hemostasis was obtained at the femoral access site with the Cordis Exoseal device. FINDINGS: Initial arteriography of the superior mesenteric artery shows relative vasoconstriction with no evidence of arterial bleeding or other vascular abnormality. After administration of intra-arterial nitroglycerin, improved  vasodilatation was present. Arteriography of the entire SMA supply demonstrates no evidence of active arterial bleeding, pseudoaneurysm, AV malformation or angiodysplasia. An endoscopic plastic biliary stent is present in the ascending colon. No arterial bleeding is identified at the level of the stent. 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 An enlarged trunk was identified emanating off of the distal aorta which when catheterized represents a hypertrophied left-sided lumbar artery versus other retroperitoneal branch that supplies no bowel. IMPRESSION: No arterial source for gastrointestinal bleeding identified by arteriography of the superior mesenteric artery. No evidence of underlying arteriovenous malformation or angiodysplasia. At the level of a migrated endoscopic biliary stent within the ascending colon, no active bleeding is demonstrated. 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 Electronically Signed   By: Aletta Edouard M.D.   On: 08/14/2016 10:38   Ir Angio/spinal Left  Result Date: 08/14/2016 INDICATION: Lower GI bleed and migration of previously placed endoscopic biliary stent into proximal colon. Nuclear medicine bleeding scan yesterday did not show evidence of active bleeding and request has been made to perform arteriography due to persistent clinical bleeding. Colonoscopy demonstrates bright red blood in the colon without focal bleeding source demonstrated. EXAM: 1. ULTRASOUND GUIDANCE FOR VASCULAR ACCESS OF THE RIGHT COMMON FEMORAL ARTERY 2. SELECTIVE VISCERAL ARTERIOGRAPHY OF THE SUPERIOR MESENTERIC ARTERY 3. SELECTIVE ARTERIOGRAPHY OF LUMBAR ARTERY MEDICATIONS: 100 mcg intra-arterial nitroglycerin ANESTHESIA/SEDATION: Moderate (conscious) sedation was employed during this procedure. A total of Versed 1.5 mg and Fentanyl 25 mcg was administered  intravenously. Moderate Sedation Time: 50 minutes. The patient's level of consciousness and vital signs were monitored continuously by radiology nursing throughout the procedure under my direct supervision. CONTRAST:  120 mL Isovue-300 FLUOROSCOPY TIME:  Fluoroscopy Time: 14 minutes 18 seconds. (171.6 mGy). COMPLICATIONS: None immediate. PROCEDURE: Informed consent was obtained from the patient's husband following explanation of the procedure, risks, benefits and alternatives. The patient's husband understands, agrees and consents for the procedure. All questions were addressed. A time out was performed prior to the initiation of the procedure. Maximal barrier sterile technique utilized including caps, mask, sterile gowns, sterile gloves, large sterile drape, hand hygiene, and Betadine prep. The right common femoral artery was localized by ultrasound and patency confirmed by ultrasound. Access of the artery was performed under direct ultrasound guidance with a micropuncture set and ultrasound image documentation performed. After access of the right common femoral artery, a 5 French sheath was advanced over a guidewire. A 5 French Cobra catheter was then advanced into the abdominal aorta. This was used to selectively catheterize the superior mesenteric artery. The catheter was advanced into the trunk of the superior mesenteric artery and initial selective arteriography performed. Additional selective arteriography was then performed of the entire SMA territory after intra-arterial administration of 100 mcg nitroglycerin. Attempt to find the origin of the inferior mesenteric artery was then performed with the Cobra catheter. The Cobra catheter was then exchanged for a 5 [place] Sos catheter and attempts made to find and catheterize the inferior mesenteric artery origin. Arteriography of the distal aorta  was performed through the Sos catheter. Eventually, a vessel was catheterized and selectively injected which  corresponded to a hypertrophied lumbar artery. The Sos catheter was then removed over a guidewire. Hemostasis was obtained at the femoral access site with the Cordis Exoseal device. FINDINGS: Initial arteriography of the superior mesenteric artery shows relative vasoconstriction with no evidence of arterial bleeding or other vascular abnormality. After administration of intra-arterial nitroglycerin, improved vasodilatation was present. Arteriography of the entire SMA supply demonstrates no evidence of active arterial bleeding, pseudoaneurysm, AV malformation or angiodysplasia. An endoscopic plastic biliary stent is present in the ascending colon. No arterial bleeding is identified at the level of the stent. 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 An enlarged trunk was identified emanating off of the distal aorta which when catheterized represents a hypertrophied left-sided lumbar artery versus other retroperitoneal branch that supplies no bowel. IMPRESSION: No arterial source for gastrointestinal bleeding identified by arteriography of the superior mesenteric artery. No evidence of underlying arteriovenous malformation or angiodysplasia. At the level of a migrated endoscopic biliary stent within the ascending colon, no active bleeding is demonstrated. 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 Electronically Signed   By: Aletta Edouard M.D.   On: 08/14/2016 10:38   Ir US Guide Vasc Access Right  Result Date: 08/14/2016 INDICATION: Lower GI bleed and migration of previously placed endoscopic biliary stent into proximal colon. Nuclear medicine bleeding scan yesterday did not show evidence of active bleeding and request has been made to perform arteriography due to persistent clinical bleeding. Colonoscopy demonstrates bright red blood in the colon without focal  bleeding source demonstrated. EXAM: 1. ULTRASOUND GUIDANCE FOR VASCULAR ACCESS OF THE RIGHT COMMON FEMORAL ARTERY 2. SELECTIVE VISCERAL ARTERIOGRAPHY OF THE SUPERIOR MESENTERIC ARTERY 3. SELECTIVE ARTERIOGRAPHY OF LUMBAR ARTERY MEDICATIONS: 100 mcg intra-arterial nitroglycerin ANESTHESIA/SEDATION: Moderate (conscious) sedation was employed during this procedure. A total of Versed 1.5 mg and Fentanyl 25 mcg was administered intravenously. Moderate Sedation Time: 50 minutes. The patient's level of consciousness and vital signs were monitored continuously by radiology nursing throughout the procedure under my direct supervision. CONTRAST:  120 mL Isovue-300 FLUOROSCOPY TIME:  Fluoroscopy Time: 14 minutes 18 seconds. (171.6 mGy). COMPLICATIONS: None immediate. PROCEDURE: Informed consent was obtained from the patient's husband following explanation of the procedure, risks, benefits and alternatives. The patient's husband understands, agrees and consents for the procedure. All questions were addressed. A time out was performed prior to the initiation of the procedure. Maximal barrier sterile technique utilized including caps, mask, sterile gowns, sterile gloves, large sterile drape, hand hygiene, and Betadine prep. The right common femoral artery was localized by ultrasound and patency confirmed by ultrasound. Access of the artery was performed under direct ultrasound guidance with a micropuncture set and ultrasound image documentation performed. After access of the right common femoral artery, a 5 French sheath was advanced over a guidewire. A 5 French Cobra catheter was then advanced into the abdominal aorta. This was used to selectively catheterize the superior mesenteric artery. The catheter was advanced into the trunk of the superior mesenteric artery and initial selective arteriography performed. Additional selective arteriography was then performed of the entire SMA territory after intra-arterial administration of  100 mcg nitroglycerin. Attempt to find the origin of the inferior mesenteric artery was then performed with the Cobra catheter. The Cobra catheter was then exchanged for a 5  [place] Sos catheter and attempts made to find and catheterize the inferior mesenteric artery origin. Arteriography of the distal aorta was performed through the Sos catheter. Eventually, a vessel was catheterized and selectively injected which corresponded to a hypertrophied lumbar artery. The Sos catheter was then removed over a guidewire. Hemostasis was obtained at the femoral access site with the Cordis Exoseal device. FINDINGS: Initial arteriography of the superior mesenteric artery shows relative vasoconstriction with no evidence of arterial bleeding or other vascular abnormality. After administration of intra-arterial nitroglycerin, improved vasodilatation was present. Arteriography of the entire SMA supply demonstrates no evidence of active arterial bleeding, pseudoaneurysm, AV malformation or angiodysplasia. An endoscopic plastic biliary stent is present in the ascending colon. No arterial bleeding is identified at the level of the stent. 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 An enlarged trunk was identified emanating off of the distal aorta which when catheterized represents a hypertrophied left-sided lumbar artery versus other retroperitoneal branch that supplies no bowel. IMPRESSION: No arterial source for gastrointestinal bleeding identified by arteriography of the superior mesenteric artery. No evidence of underlying arteriovenous malformation or angiodysplasia. At the level of a migrated endoscopic biliary stent within the ascending colon, no active bleeding is demonstrated. 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 Electronically Signed   By: Aletta Edouard M.D.   On: 08/14/2016  10:38   Dg Chest Portable 1 View  Result Date: 08/08/2016 CLINICAL DATA:  Dyspnea and EXAM: PORTABLE CHEST 1 VIEW COMPARISON:  03/14/2016 FINDINGS: The heart size and mediastinal contours are within normal limits. Aortic atherosclerosis is noted. No pneumonic consolidation, effusion or pneumothorax. Chronic subpleural interstitial prominence may reflect areas of fibrosis, scarring and/or chronic interstitial lung disease. Osteoarthritis of both glenohumeral joints with spurring and joint space narrowing. Slight remodeling along the undersurface of the distal acromion and clavicle on the left. This can be seen with chronic rotator cuff tear or rheumatoid arthritis. IMPRESSION: 1. Aortic atherosclerosis. 2. Mild stable interstitial prominence which may reflect subpleural fibrosis, scarring or sequela of chronic interstitial disease. 3. No acute pneumonic consolidation or CHF. Electronically Signed   By: Ashley Royalty M.D.   On: 08/08/2016 19:52   Dg Ercp  Result Date: 08/09/2016 CLINICAL DATA:  ERCP EXAM: ERCP TECHNIQUE: Multiple spot images obtained with the fluoroscopic device and submitted for interpretation post-procedure. COMPARISON:  CT abdomen pelvis - 08/08/2016 FINDINGS: 3 spot intraoperative fluoroscopic images of the right upper abdominal quadrant during ERCP are provided for review Initial image demonstrates an ERCP probe overlying the right upper abdominal quadrant. Post cholecystectomy. There is selective cannulation opacification of the common bile duct which appears markedly dilated compatible with the findings seen on preceding abdominal CT. There is no definitive opacification of the pancreatic or residual cystic ducts. There is no significant opacification of the intrahepatic biliary system. No definitive filling defects are seen with the opacified portions of the common bile duct to suggest the presence of choledocholithiasis. Completion image demonstrates placement of a internal biliary stent  overlying the distal aspect of the common bile duct. IMPRESSION: ERCP with biliary stent placement as above. These images were submitted for radiologic interpretation only. Please see the procedural report for the amount of contrast and the fluoroscopy time utilized. Electronically Signed   By: Sandi Mariscal M.D.   On: 08/09/2016 14:36   Dg Abd 2 Views  Result  Date: 08/14/2016 CLINICAL DATA:  Images obtained for Displacement of biliary stent. No current abdominal complaints. EXAM: ABDOMEN - 2 VIEW COMPARISON:  CT abdomen 08/11/2016 FINDINGS: There is a biliary stent which is located in the right mid abdomen to the right of the L4 vertebral body in a transverse orientation concerning for displacement from the common bile duct. There is no bowel dilatation to suggest obstruction. There is no evidence of pneumoperitoneum, portal venous gas or pneumatosis. There are no pathologic calcifications along the expected course of the ureters. Left total hip arthroplasty. Levoscoliosis of the lumbar spine. Lumbar spine spondylosis. IMPRESSION: There is a biliary stent which is located in the right mid abdomen to the right of the L4 vertebral body in a transverse orientation concerning for displacement from the common bile duct. If there is further clinical concern regarding the exact location of the biliary stent, recommend evaluation with a CT of the abdomen. Electronically Signed   By: Kathreen Devoid   On: 08/14/2016 15:08   Dg Abd 2 Views  Result Date: 08/13/2016 CLINICAL DATA:  83 year old female with displaced biliary stent. Subsequent encounter. EXAM: ABDOMEN - 2 VIEW COMPARISON:  08/12/2016 plain film exam.  08/11/2016 CT. FINDINGS: Biliary stent has shifted position slightly now located within the right lower abdomen/ right upper pelvis. It is possible this is in the right colon. No findings of bowel obstruction or free intraperitoneal air. Gas within the biliary system felt to be related to stent placement.  Degenerative changes lumbar spine convex left. Post left hip replacement. Post cholecystectomy. IMPRESSION: Biliary stent has shifted position slightly now located within the right lower abdomen/ right upper pelvis. It is possible this is in the right colon. Electronically Signed   By: Genia Del M.D.   On: 08/13/2016 13:29   Dg Abd 2 Views  Result Date: 08/12/2016 CLINICAL DATA:  Displacement of biliary stent. EXAM: ABDOMEN - 2 VIEW COMPARISON:  CT of the abdomen and pelvis 08/11/2016 FINDINGS: The biliary stent is noted in the right-sided the abdomen, projecting over the ascending colon on all images. Levoconvex curvature is present in the lumbar spine. Atherosclerotic changes are noted. A partial left hip replacement is noted. IMPRESSION: The biliary stent projects over the right side of the abdomen, potentially within the ascending colon. Electronically Signed   By: San Morelle M.D.   On: 08/12/2016 21:30   US Abdomen Limited Ruq  Result Date: 08/10/2016 CLINICAL DATA:  Cholangitis. ERCP earlier this day with stent placement. EXAM: ULTRASOUND ABDOMEN LIMITED RIGHT UPPER QUADRANT COMPARISON:  Abdominal CT yesterday. FINDINGS: Gallbladder: Surgically absent. Common bile duct: Diameter: 17 mm at the porta hepatis. 2.2 x 2.5 cm distally. Biliary stent is visualized. Liver: No focal lesion identified. Linear echogenic shadowing is likely pneumobilia related to recent ERCP. Normal directional flow in the main portal vein. 2.6 cm cyst in the pancreatic head, as seen on CT. IMPRESSION: 1. Biliary dilatation with biliary stent visualized in the common bile duct. The degree of biliary dilatation appears similar to CT yesterday. 2. Linear echogenic shadowing structures in the liver likely pneumobilia. Electronically Signed   By: Jeb Levering M.D.   On: 08/10/2016 02:27    Signature  Lala Lund M.D on 08/16/2016 at 9:38 AM  Between 7am to 7pm - Pager - [phone] ( page via [email or web address], text  pages only, please mention full 10 digit call back number).  After 7pm go to [email or web address] - password [facility]

## 2016-08-16 NOTE — Care Management Note (Signed)
Case Management Note  Patient Details  Name: [name] MRN: [id number] Date of Birth: [birth year]  Subjective/Objective:                    Action/Plan:   PTA from home with husband.  Daughters at bedside.  Pt uses walker to walk in the home.  Per daughters pt has PCP and denied barriers to obtaining prescribed medications   Expected Discharge Date:                  Expected Discharge Plan:  [place]  In-House Referral:     Discharge planning Services  CM Consult  Post Acute Care Choice:    Choice offered to:  Patient  DME Arranged:    DME Agency:     HH Arranged:  PT [place]:  [place]  Status of Service:  In process, will continue to follow  If discussed at Long Length of Stay Meetings, dates discussed:    Additional Comments: 08/16/2016  Discussed during LOS 6/14 - pt remains appropriate for continued stay.  Pt is now s/p colonoscopy.  Clear liquid diet for today.  Husband will provide 24 hour supervision as recommended  Pt given choice for HH - chose [facility] - agency contacted and referral accepted.  Pt confirmed she stays with husband and uses walker to assist with ambulation.    Discussed in LOS 08/14/16 - pt remains appropriate for continued stay.  Pt had negative angiogram yesterday and therefore there is still no known source of bleeding even though Patient continues to have maroon color bowel movements. Has receivedd around 7 units of blood transfusion so far. Denied abdominal pain. Denied vomiting. Complaining of nausea.   CM will continue to follow for discharge needs  Maryclare Labrador, RN 08/16/2016, 2:27 PM

## 2016-08-16 NOTE — Consult Note (Signed)
[facility] Surgery Consult Note  JAID QUIRION 03-30-1929  [id number].    Requesting MD: Candiss Norse, MD Chief Complaint/Reason for Consult: bleeding cecal polyp HPI:  [name] is a 83 y.o. Female with a PMH bipolar disorder, HTN, DM, CKD, HFpEF, and chronic pain who presented to [facility] with epigastric pain and fever. She was diagnosed with acute cholangitis and received biliary sphincterotomy and placement of CBD stent 08/09/16. After this procedure she developed melena/maroon stools requiring transfusion and continued GI workup. Colonoscopy on 6/13 revealed a friable polypoid lesion in the cecum (injected with epi, clipped 1x, BX) and a polyp of the ascending colon that was removed. General surgery was asked to consult for risk of continued bleeding from polyp. Today the patient denies abdominal pain or further rectal bleeding. According to the nurse, the patient had a BM last night that was non-bloody.   ROS: Review of Systems  Constitutional: Negative for chills and fever.  Respiratory: Negative for shortness of breath.   Cardiovascular: Negative for chest pain.  Gastrointestinal: Positive for blood in stool and melena. Negative for abdominal pain, nausea and vomiting.    Family History  Problem Relation Age of Onset  . Arthritis Mother   . Heart attack Father     Past Medical History:  Diagnosis Date  . Chest pain    a. 07/2014 Neg MV, nl EF.  Marland Kitchen Chronic interstitial cystitis   . Chronic low back pain   . CKD (chronic kidney disease), stage III   . Diabetes mellitus without complication ([place])   . GERD (gastroesophageal reflux disease)   . Hyperlipidemia   . Hypertension   . Macular degeneration   . Macular degeneration of both eyes 08/03/2016   see pinks spots  . Microhematuria   . Osteopenia   . Pernicious anemia   . Postlaminectomy syndrome   . PVC (premature ventricular contraction)   . Right carpal tunnel syndrome     Past Surgical History:  Procedure Laterality Date   . BACK SURGERY    . CHOLECYSTECTOMY N/A 12/21/2015   Procedure: LAPAROSCOPIC CHOLECYSTECTOMY WITH INTRAOPERATIVE CHOLANGIOGRAM;  Surgeon: Alphonsa Overall, MD;  Location: [place];  Service: General;  Laterality: N/A;  . COLONOSCOPY WITH PROPOFOL N/A 08/15/2016   Procedure: COLONOSCOPY WITH PROPOFOL;  Surgeon: Otis Brace, MD;  Location: [place];  Service: Gastroenterology;  Laterality: N/A;  . ERCP N/A 08/09/2016   Procedure: ENDOSCOPIC RETROGRADE CHOLANGIOPANCREATOGRAPHY (ERCP);  Surgeon: Ronnette Juniper, MD;  Location: [place];  Service: Gastroenterology;  Laterality: N/A;  prone position  . ESOPHAGOGASTRODUODENOSCOPY (EGD) WITH PROPOFOL N/A 08/12/2016   Procedure: ESOPHAGOGASTRODUODENOSCOPY (EGD) WITH PROPOFOL/ EGD with side viewing scope;  Surgeon: Otis Brace, MD;  Location: [place];  Service: Gastroenterology;  Laterality: N/A;  . EUS N/A 02/23/2015   Procedure: ESOPHAGEAL ENDOSCOPIC ULTRASOUND (EUS) RADIAL;  Surgeon: Arta Silence, MD;  Location: [facility];  Service: Endoscopy;  Laterality: N/A;  . FLEXIBLE SIGMOIDOSCOPY N/A 08/13/2016   Procedure: FLEXIBLE SIGMOIDOSCOPY;  Surgeon: Otis Brace, MD;  Location: [place];  Service: Gastroenterology;  Laterality: N/A;  . IR ANGIO/SPINAL LEFT  08/13/2016  . IR ANGIOGRAM VISCERAL SELECTIVE  08/13/2016  . IR US GUIDE VASC ACCESS RIGHT  08/13/2016  . JOINT REPLACEMENT Left    hip and knee    Social History:  reports that she has never smoked. She has never used smokeless tobacco. She reports that she does not drink alcohol or use drugs.  Allergies:  Allergies  Allergen Reactions  . Codeine Other (See  Comments)    slurred speech    Medications Prior to Admission  Medication Sig Dispense Refill  . acetaminophen (TYLENOL) 500 MG tablet Take 500 mg by mouth every 6 (six) hours as needed for mild pain.    Marland Kitchen amLODipine (NORVASC) 5 MG tablet Take 1 tablet (5 mg total) by mouth daily. 30 tablet 3  . aspirin EC 81 MG  tablet Take 81 mg by mouth daily.    Marland Kitchen atorvastatin (LIPITOR) 10 MG tablet Take 5 mg by mouth daily.    . cholecalciferol (VITAMIN D) 1000 UNITS tablet Take 2,000 Units by mouth daily.     Marland Kitchen conjugated estrogens (PREMARIN) vaginal cream Place 1 Applicatorful vaginally daily as needed (burning).     . cyanocobalamin (,VITAMIN B-12,) 1000 MCG/ML injection Inject 1,000 mcg into the muscle every 30 (thirty) days.    Marland Kitchen FLUoxetine (PROZAC) 20 MG tablet Take 20 mg by mouth daily.    . furosemide (LASIX) 20 MG tablet Take 2 tablets (40 mg total) by mouth daily. (Patient taking differently: Take 20 mg by mouth daily. ) 30 tablet   . glimepiride (AMARYL) 1 MG tablet Take 2 tablets (2 mg total) by mouth every morning. 30 tablet 0  . lisinopril (PRINIVIL,ZESTRIL) 20 MG tablet Take 1 tablet (20 mg total) by mouth every evening. 60 tablet 0  . lithium carbonate 150 MG capsule Take 150 mg by mouth daily.    . metoprolol (LOPRESSOR) 50 MG tablet Take 1 tablet (50 mg total) by mouth 2 (two) times daily. 60 tablet 0  . mirtazapine (REMERON) 30 MG tablet Take 30 mg by mouth at bedtime.     . nitroGLYCERIN (NITROSTAT) 0.4 MG SL tablet Place 1 tablet (0.4 mg total) under the tongue every 5 (five) minutes as needed for chest pain. 30 tablet 0  . omeprazole (PRILOSEC) 40 MG capsule Take 40 mg by mouth every evening.     Marland Kitchen oxybutynin (DITROPAN) 5 MG tablet Take 5 mg by mouth 2 (two) times daily.    . traMADol (ULTRAM) 50 MG tablet Take 1 tablet (50 mg total) by mouth 2 (two) times daily as needed (pain). (Patient taking differently: Take 50 mg by mouth 2 (two) times daily. ) 30 tablet 0  . zolpidem (AMBIEN) 5 MG tablet Take 2.5 mg by mouth at bedtime.       Blood pressure (!) 148/53, pulse 75, temperature 98.8 F (37.1 C), temperature source Oral, resp. rate 16, height 5' 1" (1.549 m), weight 67.8 kg (149 lb 7.6 oz), SpO2 100 %. Physical Exam: Physical Exam  Constitutional: She is oriented to person, place, and time.  She appears well-developed and well-nourished. No distress.  HENT:  Head: Normocephalic and atraumatic.  Right Ear: External ear normal.  Left Ear: External ear normal.  Eyes: EOM are normal. Right eye exhibits no discharge. Left eye exhibits no discharge.  Neck: Normal range of motion. No tracheal deviation present.  Cardiovascular: Normal rate, regular rhythm and normal heart sounds.  Exam reveals no gallop and no friction rub.   No murmur heard. Pulmonary/Chest: Effort normal and breath sounds normal. No stridor. No respiratory distress. She has no wheezes. She exhibits no tenderness.  Abdominal: Soft. Bowel sounds are normal. She exhibits no distension and no mass. There is no tenderness. There is no guarding.  Musculoskeletal: She exhibits no tenderness or deformity.  Neurological: She is alert and oriented to person, place, and time. No sensory deficit.  Skin: Skin is warm and dry.  No rash noted. She is not diaphoretic.  Psychiatric: She has a normal mood and affect. Her behavior is normal.    Results for orders placed or performed during the hospital encounter of 08/08/16 (from the past 48 hour(s))  Glucose, capillary     Status: Abnormal   Collection Time: 08/14/16 12:49 PM  Result Value Ref Range   Glucose-Capillary 160 (H) 65 - 99 mg/dL   Comment 1 Notify RN    Comment 2 Document in Chart   Hemoglobin and hematocrit, blood     Status: Abnormal   Collection Time: 08/14/16  1:16 PM  Result Value Ref Range   Hemoglobin 9.5 (L) 12.0 - 15.0 g/dL   HCT 28.5 (L) 36.0 - 46.0 %  Glucose, capillary     Status: None   Collection Time: 08/14/16  5:22 PM  Result Value Ref Range   Glucose-Capillary 96 65 - 99 mg/dL   Comment 1 Notify RN    Comment 2 Document in Chart   Hemoglobin and hematocrit, blood     Status: Abnormal   Collection Time: 08/14/16  6:00 PM  Result Value Ref Range   Hemoglobin 8.7 (L) 12.0 - 15.0 g/dL   HCT 25.3 (L) 36.0 - 46.0 %  Glucose, capillary     Status:  None   Collection Time: 08/14/16  7:53 PM  Result Value Ref Range   Glucose-Capillary 91 65 - 99 mg/dL  Hemoglobin and hematocrit, blood     Status: Abnormal   Collection Time: 08/15/16 12:13 AM  Result Value Ref Range   Hemoglobin 8.5 (L) 12.0 - 15.0 g/dL   HCT 24.9 (L) 36.0 - 46.0 %  Glucose, capillary     Status: Abnormal   Collection Time: 08/15/16 12:17 AM  Result Value Ref Range   Glucose-Capillary 46 (L) 65 - 99 mg/dL   Comment 1 Repeat Test   Glucose, capillary     Status: None   Collection Time: 08/15/16 12:19 AM  Result Value Ref Range   Glucose-Capillary 95 65 - 99 mg/dL  Comprehensive metabolic panel     Status: Abnormal   Collection Time: 08/15/16  6:00 AM  Result Value Ref Range   Sodium 139 135 - 145 mmol/L   Potassium 3.2 (L) 3.5 - 5.1 mmol/L   Chloride 116 (H) 101 - 111 mmol/L   CO2 18 (L) 22 - 32 mmol/L   Glucose, Bld 93 65 - 99 mg/dL   BUN 8 6 - 20 mg/dL   Creatinine, Ser 0.80 0.44 - 1.00 mg/dL   Calcium 7.0 (L) 8.9 - 10.3 mg/dL   Total Protein 3.4 (L) 6.5 - 8.1 g/dL   Albumin 1.8 (L) 3.5 - 5.0 g/dL   AST 18 15 - 41 U/L   ALT 59 (H) 14 - 54 U/L   Alkaline Phosphatase 58 38 - 126 U/L   Total Bilirubin 0.8 0.3 - 1.2 mg/dL   GFR calc non Af Amer >60 >60 mL/min   GFR calc Af Amer >60 >60 mL/min    Comment: (NOTE) The eGFR has been calculated using the CKD EPI equation. This calculation has not been validated in all clinical situations. eGFR's persistently <60 mL/min signify possible Chronic Kidney Disease.    Anion gap 5 5 - 15  CBC     Status: Abnormal   Collection Time: 08/15/16  6:00 AM  Result Value Ref Range   WBC 6.6 4.0 - 10.5 K/uL   RBC 2.71 (L) 3.87 - 5.11 MIL/uL  Hemoglobin 8.1 (L) 12.0 - 15.0 g/dL   HCT 23.7 (L) 36.0 - 46.0 %   MCV 87.5 78.0 - 100.0 fL   MCH 29.9 26.0 - 34.0 pg   MCHC 34.2 30.0 - 36.0 g/dL   RDW 15.5 11.5 - 15.5 %   Platelets 93 (L) 150 - 400 K/uL    Comment: REPEATED TO VERIFY CONSISTENT WITH PREVIOUS RESULT    Hemoglobin and hematocrit, blood     Status: Abnormal   Collection Time: 08/15/16 12:00 PM  Result Value Ref Range   Hemoglobin 9.5 (L) 12.0 - 15.0 g/dL   HCT 28.1 (L) 36.0 - 46.0 %  Glucose, capillary     Status: Abnormal   Collection Time: 08/15/16  4:35 PM  Result Value Ref Range   Glucose-Capillary 158 (H) 65 - 99 mg/dL  Hemoglobin and hematocrit, blood     Status: Abnormal   Collection Time: 08/15/16  6:48 PM  Result Value Ref Range   Hemoglobin 9.3 (L) 12.0 - 15.0 g/dL   HCT 28.0 (L) 36.0 - 46.0 %  Glucose, capillary     Status: Abnormal   Collection Time: 08/15/16  7:54 PM  Result Value Ref Range   Glucose-Capillary 127 (H) 65 - 99 mg/dL   Comment 1 Notify RN   Glucose, capillary     Status: Abnormal   Collection Time: 08/15/16 11:42 PM  Result Value Ref Range   Glucose-Capillary 160 (H) 65 - 99 mg/dL   Comment 1 Notify RN   Glucose, capillary     Status: Abnormal   Collection Time: 08/16/16  4:43 AM  Result Value Ref Range   Glucose-Capillary 131 (H) 65 - 99 mg/dL   Comment 1 Notify RN   Hemoglobin and hematocrit, blood     Status: Abnormal   Collection Time: 08/16/16  4:56 AM  Result Value Ref Range   Hemoglobin 7.9 (L) 12.0 - 15.0 g/dL   HCT 24.0 (L) 36.0 - 73.2 %  Basic metabolic panel     Status: Abnormal   Collection Time: 08/16/16  4:58 AM  Result Value Ref Range   Sodium 141 135 - 145 mmol/L   Potassium 4.5 3.5 - 5.1 mmol/L    Comment: DELTA CHECK NOTED   Chloride 118 (H) 101 - 111 mmol/L   CO2 20 (L) 22 - 32 mmol/L   Glucose, Bld 132 (H) 65 - 99 mg/dL   BUN 6 6 - 20 mg/dL   Creatinine, Ser 0.76 0.44 - 1.00 mg/dL   Calcium 7.7 (L) 8.9 - 10.3 mg/dL   GFR calc non Af Amer >60 >60 mL/min   GFR calc Af Amer >60 >60 mL/min    Comment: (NOTE) The eGFR has been calculated using the CKD EPI equation. This calculation has not been validated in all clinical situations. eGFR's persistently <60 mL/min signify possible Chronic Kidney Disease.    Anion gap 3  (L) 5 - 15  Glucose, capillary     Status: Abnormal   Collection Time: 08/16/16  8:44 AM  Result Value Ref Range   Glucose-Capillary 128 (H) 65 - 99 mg/dL   Dg Abd 2 Views  Result Date: 08/14/2016 CLINICAL DATA:  Images obtained for Displacement of biliary stent. No current abdominal complaints. EXAM: ABDOMEN - 2 VIEW COMPARISON:  CT abdomen 08/11/2016 FINDINGS: There is a biliary stent which is located in the right mid abdomen to the right of the L4 vertebral body in a transverse orientation concerning for displacement from the common  bile duct. There is no bowel dilatation to suggest obstruction. There is no evidence of pneumoperitoneum, portal venous gas or pneumatosis. There are no pathologic calcifications along the expected course of the ureters. Left total hip arthroplasty. Levoscoliosis of the lumbar spine. Lumbar spine spondylosis. IMPRESSION: There is a biliary stent which is located in the right mid abdomen to the right of the L4 vertebral body in a transverse orientation concerning for displacement from the common bile duct. If there is further clinical concern regarding the exact location of the biliary stent, recommend evaluation with a CT of the abdomen. Electronically Signed   By: Kathreen Devoid   On: 08/14/2016 15:08   Assessment/Plan Cecal polyp Blood per rectum - s/p colonoscopy, injection of polyp with epi, single clip application, biopsy of polyp of ascending colon, and retrieval of CBD stent 08/15/16 Dr. Alessandra Bevels - Patient denies surgery, even if she does have continued bleeding. - Recommend close monitoring and trend CBC. If bleeding recurs or worsens we will discuss surgery with the family again and confirm that they do not want any surgical intervention -DNR/DNI     Jill Alexanders, [facility] Surgery 08/16/2016, 9:48 AM Pager: [phone] Consults: [phone] Mon-Fri 7:00 am-4:30 pm Sat-Sun 7:00 am-11:30 am

## 2016-08-16 NOTE — Progress Notes (Signed)
[facility] Gastroenterology Progress Note  [name] 83 y.o. 18-Apr-1929  CC:  Cholangitis/ GI bleed   Subjective: Patient continues to have drop in hemoglobin but has no further bleeding episodes. Discussed with the nursing staff she is having yellow colored stool now. Denied abdominal pain. Complaining of nausea but denied any vomiting  ROS : Negative for chest pain or shortness of breath.   Objective: Vital signs in last 24 hours: Vitals:   08/16/16 0300 08/16/16 0714  BP: (!) 135/93 (!) 148/53  Pulse: 65 75  Resp: 19 16  Temp: 99 F (37.2 C) 98.8 F (37.1 C)    Physical Exam:  General:  Alert, cooperative, no distress, appears stated age. Dry lips noted   Head:  Normocephalic, without obvious abnormality, atraumatic  Eyes:  , EOM's intact,Mild scleral icterus   Lungs:   Clear to auscultation bilaterally, respirations unlabored. Anterior exam only   Heart:  Regular rate and rhythm, S1, S2 normal  Abdomen:   Abdomen soft,NT, nondistended Bowel sounds present. No peritoneal sign.   Extremities: Extremities normal, atraumatic, no  edema       Lab Results:  Recent Labs  08/15/16 0600 08/16/16 0458  NA 139 141  K 3.2* 4.5  CL 116* 118*  CO2 18* 20*  GLUCOSE 93 132*  BUN 8 6  CREATININE 0.80 0.76  CALCIUM 7.0* 7.7*    Recent Labs  08/15/16 0600  AST 18  ALT 59*  ALKPHOS 58  BILITOT 0.8  PROT 3.4*  ALBUMIN 1.8*    Recent Labs  08/13/16 1833  08/14/16 0727  08/15/16 0600  08/15/16 1848 08/16/16 0456  WBC 10.7*  --  9.2  --  6.6  --   --   --   NEUTROABS 6.1  --   --   --   --   --   --   --   HGB 7.9*  < > 8.4*  < > 8.1*  < > 9.3* 7.9*  HCT 23.2*  < > 25.1*  < > 23.7*  < > 28.0* 24.0*  MCV 86.2  --  89.3  --  87.5  --   --   --   PLT 121*  --  112*  --  93*  --   --   --   < > = values in this interval not displayed. No results for input(s): LABPROT, INR in the last 72 hours.    Assessment/Plan: - GI bleed with Initial dark stools but now right  red blood per rectum. Negative EGD , negative bleeding scan, negative angiogram flexible sigmoidoscopy showed fresh and clotted blood. Repeat colonoscopy yesterday showed friable polypoid lesion in the cecum near the ileocecal wall with oozing of blood which was treated with epinephrine injection and 1 clip placement.  -   Ascending cholangitis. Status post ERCP with sphincterotomy,  removal of pus and plastic stent placement. LFTs trending down.  - Displacement of the previously placed biliary stent. X-ray showed stent possibly in ascending colon. Status post removal with snare yesterday during colonoscopy - Gram-negative bacteremia - Sepsis.   Recommendations ------------------------- - colonoscopy yesterday showed friable polypoid lesion in the cecum near the ileocecal wall with oozing of blood which was treated with epinephrine injection and 1 clip placement. I was unable to identify the base to achieve complete polypectomy. Follow biopsy results. If evidence of further bleeding she may need surgical evaluation. - Repeat colonoscopy based on biopsy findings. - Plastic stent was removed with  the snare during colonoscopy. - Findings of procedure and ongoing management was discussed with the daughter over the phone yesterday. - Monitor H&H. Transfuse as needed. Full liquid diet today.  - GI will follow   Otis Brace MD, [place] 08/16/2016, 9:38 AM  Pager [phone]  If no answer or after 5 PM call [phone]

## 2016-08-17 LAB — BASIC METABOLIC PANEL
Anion gap: 4 — ABNORMAL LOW (ref 5–15)
BUN: 6 mg/dL (ref 6–20)
CHLORIDE: 113 mmol/L — AB (ref 101–111)
CO2: 22 mmol/L (ref 22–32)
Calcium: 8.4 mg/dL — ABNORMAL LOW (ref 8.9–10.3)
Creatinine, Ser: 0.79 mg/dL (ref 0.44–1.00)
GFR calc Af Amer: >60 mL/min (ref >60)
GFR calc non Af Amer: >60 mL/min (ref >60)
GLUCOSE: 124 mg/dL — AB (ref 65–99)
POTASSIUM: 4 mmol/L (ref 3.5–5.1)
SODIUM: 139 mmol/L (ref 135–145)

## 2016-08-17 LAB — CBC
HEMATOCRIT: 26.7 % — AB (ref 36.0–46.0)
Hemoglobin: 8.8 g/dL — ABNORMAL LOW (ref 12.0–15.0)
MCH: 30.2 pg (ref 26.0–34.0)
MCHC: 33 g/dL (ref 30.0–36.0)
MCV: 91.8 fL (ref 78.0–100.0)
Platelets: 166 10*3/uL (ref 150–400)
RBC: 2.91 MIL/uL — ABNORMAL LOW (ref 3.87–5.11)
RDW: 17.1 % — AB (ref 11.5–15.5)
WBC: 6.4 10*3/uL (ref 4.0–10.5)

## 2016-08-17 LAB — GLUCOSE, CAPILLARY
GLUCOSE-CAPILLARY: 107 mg/dL — AB (ref 65–99)
GLUCOSE-CAPILLARY: 169 mg/dL — AB (ref 65–99)
GLUCOSE-CAPILLARY: 234 mg/dL — AB (ref 65–99)
Glucose-Capillary: 138 mg/dL — ABNORMAL HIGH (ref 65–99)
Glucose-Capillary: 156 mg/dL — ABNORMAL HIGH (ref 65–99)
Glucose-Capillary: 161 mg/dL — ABNORMAL HIGH (ref 65–99)
Glucose-Capillary: 97 mg/dL (ref 65–99)

## 2016-08-17 LAB — PROTIME-INR
INR: 1.14
Prothrombin Time: 14.7 seconds (ref 11.4–15.2)

## 2016-08-17 LAB — HEMOGLOBIN AND HEMATOCRIT, BLOOD
HCT: 26.2 % — ABNORMAL LOW (ref 36.0–46.0)
HEMATOCRIT: 27.8 % — AB (ref 36.0–46.0)
HEMOGLOBIN: 9 g/dL — AB (ref 12.0–15.0)
Hemoglobin: 8.5 g/dL — ABNORMAL LOW (ref 12.0–15.0)

## 2016-08-17 MED ORDER — METOPROLOL TARTRATE 50 MG PO TABS
50.0000 mg | ORAL_TABLET | Freq: Two times a day (BID) | ORAL | Status: DC
Start: 2016-08-17 — End: 2016-08-18
  Administered 2016-08-17 – 2016-08-18 (×3): 50 mg via ORAL
  Filled 2016-08-17 (×3): qty 1

## 2016-08-17 MED ORDER — GLUCERNA SHAKE PO LIQD
237.0000 mL | Freq: Three times a day (TID) | ORAL | Status: DC
  Administered 2016-08-17 – 2016-08-18 (×3): 237 mL via ORAL

## 2016-08-17 MED ORDER — PANTOPRAZOLE SODIUM 40 MG PO TBEC
40.0000 mg | DELAYED_RELEASE_TABLET | Freq: Two times a day (BID) | ORAL | Status: DC
  Administered 2016-08-17 – 2016-08-18 (×3): 40 mg via ORAL
  Filled 2016-08-17 (×3): qty 1

## 2016-08-17 MED ORDER — ADULT MULTIVITAMIN W/MINERALS CH
1.0000 | ORAL_TABLET | Freq: Every day | ORAL | Status: DC
  Administered 2016-08-18: 1 via ORAL
  Filled 2016-08-17: qty 1

## 2016-08-17 NOTE — Progress Notes (Signed)
Transferred to [facility] room 21 by bed, stable, report given to RN, belongings with pt

## 2016-08-17 NOTE — Progress Notes (Signed)
PROGRESS NOTE                                                                                                                                                                                                             Patient Demographics:    [name], is a 83 y.o. female, DOB - October 30, 1929, BTY:[id number]  Admit date - 08/08/2016   Admitting Physician Edwin Dada, MD  Outpatient Primary MD for the patient is Lavone Orn, MD  LOS - 8   Chief Complaint  Patient presents with  . Chest Pain       Brief Narrative    83 y.o.WF PMHx Bipolar disorder, HTN, DM, CKD stage III ( baseline Cr 1.1), CHF, and chronic pain (interstitial cystitis and chronic back pain)Who presentedwith fever and epigastric pain. She was found to have cholangitis, status post ERCP 08/09/2016, patient started to have dark bloody bowel movement 08/11/2016, will she has been transfused total of 4 units PRBC, repeat endoscopy 6/10 with no evidence of active GI bleed, as well with negative bleeding scan, she continues to have active bleeding, Flexible sigmoidoscopy with evidence of fresh and old blood, but could not identify source, went for angiogram by IR which could not identify source of bleed as well, continues requiring frequent transfusion with active GI bleed.    Subjective:   Patient in bed, appears comfortable, denies any headache, no fever, no chest pain or pressure, no shortness of breath , no abdominal pain. No focal weakness. Mild chronic neck pain.   Assessment  & Plan :    Sepsis due to Klebsiella bacteremia/Cholangitis And transaminitis  Clinically improved from this standpoint, she is status post EGD with CBD stent placement, CBD stent post procedure has migrated and eventually was removed with colonoscopy on 08/15/2016, patient's LFTs have normalized and she is now symptom-free. Clinically this problem has resolved. GI on board.  Klebsiella pneumoniae  Bacteremia - initially was on IV Zosyn now on IV Rocephin, antibiotics started on 08/08/2016, we stop all antibiotics tomorrow. Infection has clearly defervesced.   GI bleed with Acute blood loss anemia - EGD was unremarkable and there was a cecal polyp which was bleeding and clipped via colonoscopy by GI on 08/15/2016, H&H for now has stabilized on 08/17/2016 will continue to monitor, Gen. surgery also involved in case bleeding reoccurs, patient  and daughter now agreeable for surgery if needed in the future.   Elevated troponin - This was demand ischemia due to anemia as above, troponin trend was flat and in non-ACS pattern, cannot get aspirin due to GI bleed, repeat echocardiogram done this admission shows EF of 55-60% with no regional wall motion abnormality. Continue to follow. Patient chest pain-free.  CKD stage III (Baseline creatinine 1.1)  creatinine stable close to baseline.  Chronic diastolic congestive heart failure - chemically compensated  Essential HTN - Currently blood pressure acceptable, currently on Lopressor and when necessary hydralazine, will adjust Lopressor dose for better control.  Hypokalemia. Replaced & satble.   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 - Continue with insulin sliding scale, hemoglobin A1c is 6.2  CBG (last 3)   Recent Labs  08/17/16 0051 08/17/16 0449 08/17/16 0830  GLUCAP 161* 97 107*     Bipolar disorder -Lithium 150 mg daily, Remeron 30 mg daily  MRSA screen positive     Code Status : DO NOT RESUSCITATE  Family Communication  : None at bedside, Called her daughter [name]   Disposition Plan  : Pending further workup  Barriers For Discharge : Still with active GI bleeding  Consults  :  GI  Procedures  :   6/7 ERCP - diffusely dilated CBD (2cm) w/ no visible filling defect - sphincterotomy performed leading to drainage of pus - CBD stent placed plastic CBD stent 5 cm 7 French  6/10 repeat EGD with no evidence of  active GI bleed 6/11 Mesenteric Angiography  by Dr. Kathlene Cote 6/13 Colonoscopy - GI bleed with Initial dark stools but now right red blood per rectum. Negative EGD for active bleeding yesterday.       DVT Prophylaxis  :  SCDs   Lab Results  Component Value Date   PLT 166 08/17/2016    Antibiotics  :    Anti-infectives    Start     Dose/Rate Route Frequency Ordered Stop   08/13/16 2200  piperacillin-tazobactam (ZOSYN) IVPB 3.375 g  Status:  Discontinued     3.375 g 12.5 mL/hr over 240 Minutes Intravenous Every 8 hours 08/12/16 2003 08/13/16 0705   08/13/16 0800  cefTRIAXone (ROCEPHIN) 2 g in dextrose 5 % 50 mL IVPB  Status:  Discontinued     2 g 100 mL/hr over 30 Minutes Intravenous Every 24 hours 08/13/16 0705 08/17/16 0836   08/09/16 2000  vancomycin (VANCOCIN) 500 mg in sodium chloride 0.9 % 100 mL IVPB  Status:  Discontinued     500 mg 100 mL/hr over 60 Minutes Intravenous Every 24 hours 08/08/16 1958 08/09/16 0125   08/09/16 0400  piperacillin-tazobactam (ZOSYN) IVPB 3.375 g  Status:  Discontinued     3.375 g 12.5 mL/hr over 240 Minutes Intravenous Every 8 hours 08/08/16 1958 08/12/16 2003   08/08/16 1930  piperacillin-tazobactam (ZOSYN) IVPB 3.375 g     3.375 g 100 mL/hr over 30 Minutes Intravenous  Once 08/08/16 1920 08/08/16 2104   08/08/16 1930  vancomycin (VANCOCIN) IVPB 1000 mg/200 mL premix     1,000 mg 200 mL/hr over 60 Minutes Intravenous  Once 08/08/16 1920 08/08/16 2116        Objective:   Vitals:   08/17/16 0500 08/17/16 0501 08/17/16 0600 08/17/16 0832  BP: (!) 135/48 (!) 135/48 (!) 131/45 (!) 168/67  Pulse: 83  79 79  Resp: (!) 22  19 (!) 21  Temp:  98.6 F (37 C)  99.5 F (37.5 C)  TempSrc:  Oral  Oral  SpO2: 100%  96% 97%  Weight:      Height:        Wt Readings from Last 3 Encounters:  08/10/16 67.8 kg (149 lb 7.6 oz)  03/13/16 61.4 kg (135 lb 4.8 oz)  01/05/16 64.6 kg (142 lb 6.7 oz)     Intake/Output Summary (Last 24 hours) at  08/17/16 0837 Last data filed at 08/17/16 0512  Gross per 24 hour  Intake              300 ml  Output              750 ml  Net             -450 ml     Physical Exam  Awake Alert, Oriented X 3, No new F.N deficits, Normal affect [place].AT,PERRAL Supple Neck,No JVD, No cervical lymphadenopathy appriciated.  Symmetrical Chest wall movement, Good air movement bilaterally, CTAB RRR,No Gallops,Rubs or new Murmurs, No Parasternal Heave +ve B.Sounds, Abd Soft, No tenderness, No organomegaly appriciated, No rebound - guarding or rigidity. No Cyanosis, Clubbing or edema, No new Rash or bruise     Data Review:    CBC  Recent Labs Lab 08/13/16 0031  08/13/16 1833  08/14/16 0727  08/15/16 0600  08/16/16 0456 08/16/16 1100 08/16/16 1807 08/17/16 0056 08/17/16 0620  WBC 7.4  --  10.7*  --  9.2  --  6.6  --   --   --   --   --  6.4  HGB 7.0*  < > 7.9*  < > 8.4*  < > 8.1*  < > 7.9* 8.9* 8.6* 8.5* 8.8*  HCT 21.2*  < > 23.2*  < > 25.1*  < > 23.7*  < > 24.0* 27.6* 26.3* 26.2* 26.7*  PLT 102*  --  121*  --  112*  --  93*  --   --   --   --   --  166  MCV 86.5  --  86.2  --  89.3  --  87.5  --   --   --   --   --  91.8  MCH 28.6  --  29.4  --  29.9  --  29.9  --   --   --   --   --  30.2  MCHC 33.0  --  34.1  --  33.5  --  34.2  --   --   --   --   --  33.0  RDW 16.5*  --  15.0  --  15.4  --  15.5  --   --   --   --   --  17.1*  LYMPHSABS  --   --  3.3  --   --   --   --   --   --   --   --   --   --   MONOABS  --   --  1.1*  --   --   --   --   --   --   --   --   --   --   EOSABS  --   --  0.1  --   --   --   --   --   --   --   --   --   --   BASOSABS  --   --  0.1  --   --   --   --   --   --   --   --   --   --   < > =  values in this interval not displayed.  Chemistries   Recent Labs Lab 08/11/16 0758 08/12/16 0150 08/13/16 0031 08/14/16 0727 08/15/16 0600 08/16/16 0458 08/17/16 0620  NA 144 141 141 143 139 141 139  K 4.0 4.3 4.7 5.4* 3.2* 4.5 4.0  CL 110 112* 114* 119*  116* 118* 113*  CO2 25 24 24  16* 18* 20* 22  GLUCOSE 152* 150* 123* 120* 93 132* 124*  BUN 19 18 19 20 8 6 6   CREATININE 1.13* 1.04* 1.02* 1.06* 0.80 0.76 0.79  CALCIUM 8.9 7.8* 7.5* 8.1* 7.0* 7.7* 8.4*  AST 120* 41 23  --  18  --   --   ALT 476* 230* 133*  --  59*  --   --   ALKPHOS 236* 134* 92  --  58  --   --   BILITOT 3.2* 2.0* 1.1  --  0.8  --   --    ------------------------------------------------------------------------------------------------------------------ No results for input(s): CHOL, HDL, LDLCALC, TRIG, CHOLHDL, LDLDIRECT in the last 72 hours.  Lab Results  Component Value Date   HGBA1C 6.2 (H) 08/10/2016   ------------------------------------------------------------------------------------------------------------------ No results for input(s): TSH, T4TOTAL, T3FREE, THYROIDAB in the last 72 hours.  Invalid input(s): FREET3 ------------------------------------------------------------------------------------------------------------------ No results for input(s): VITAMINB12, FOLATE, FERRITIN, TIBC, IRON, RETICCTPCT in the last 72 hours.  Coagulation profile  Recent Labs Lab 08/17/16 0620  INR 1.14    No results for input(s): DDIMER in the last 72 hours.  Cardiac Enzymes No results for input(s): CKMB, TROPONINI, MYOGLOBIN in the last 168 hours.  Invalid input(s): CK ------------------------------------------------------------------------------------------------------------------    Component Value Date/Time   BNP 35.2 08/08/2016 1845    Inpatient Medications  Scheduled Meds: . atorvastatin  5 mg Oral Daily  . feeding supplement (ENSURE ENLIVE)  237 mL Oral BID BM  . FLUoxetine  20 mg Oral Daily  . insulin aspart  0-9 Units Subcutaneous Q4H  . lithium carbonate  150 mg Oral Daily  . LORazepam  0.5 mg Intravenous Once  . metoprolol tartrate  12.5 mg Oral BID  . mirtazapine  30 mg Oral QHS  . oxybutynin  5 mg Oral BID  . pantoprazole  40 mg Oral BID    . polyethylene glycol  17 g Oral Daily  . sodium chloride flush  10-40 mL Intracatheter Q12H  . Vilazodone HCl  20 mg Oral Daily  . zolpidem  2.5 mg Oral QHS   Continuous Infusions: . sodium chloride     PRN Meds:.furosemide, hydrALAZINE, [DISCONTINUED] ondansetron **OR** ondansetron (ZOFRAN) IV, oxyCODONE  Micro Results Recent Results (from the past 240 hour(s))  Blood Culture (routine x 2)     Status: Abnormal   Collection Time: 08/08/16  7:20 PM  Result Value Ref Range Status   Specimen Description BLOOD RIGHT WRIST  Final   Special Requests   Final    BOTTLES DRAWN AEROBIC AND ANAEROBIC Blood Culture adequate volume   Culture  Setup Time   Final    GRAM NEGATIVE RODS IN BOTH AEROBIC AND ANAEROBIC BOTTLES CRITICAL VALUE NOTED.  VALUE IS CONSISTENT WITH PREVIOUSLY REPORTED AND CALLED VALUE.    Culture (A)  Final    KLEBSIELLA PNEUMONIAE SUSCEPTIBILITIES PERFORMED ON PREVIOUS CULTURE WITHIN THE LAST 5 DAYS.    Report Status 08/11/2016 FINAL  Final  Blood Culture (routine x 2)     Status: Abnormal   Collection Time: 08/08/16  8:01 PM  Result Value Ref Range Status   Specimen Description BLOOD LEFT ANTECUBITAL  Final   Special Requests IN PEDIATRIC BOTTLE Blood Culture adequate volume  Final   Culture  Setup Time   Final    GRAM NEGATIVE RODS IN PEDIATRIC BOTTLE Organism ID to follow CRITICAL RESULT CALLED TO, READ BACK BY AND VERIFIED WITH: Hughie Closs Pharm.D. 10:15 08/09/16 ([email or web address])    Culture KLEBSIELLA PNEUMONIAE (A)  Final   Report Status 08/11/2016 FINAL  Final   Organism ID, Bacteria KLEBSIELLA PNEUMONIAE  Final      Susceptibility   Klebsiella pneumoniae - MIC*    AMPICILLIN >=32 RESISTANT Resistant     CEFAZOLIN <=4 SENSITIVE Sensitive     CEFEPIME <=1 SENSITIVE Sensitive     CEFTAZIDIME <=1 SENSITIVE Sensitive     CEFTRIAXONE <=1 SENSITIVE Sensitive     CIPROFLOXACIN <=0.25 SENSITIVE Sensitive     GENTAMICIN <=1 SENSITIVE Sensitive     IMIPENEM <=0.25  SENSITIVE Sensitive     TRIMETH/SULFA <=20 SENSITIVE Sensitive     AMPICILLIN/SULBACTAM 4 SENSITIVE Sensitive     PIP/TAZO <=4 SENSITIVE Sensitive     Extended ESBL NEGATIVE Sensitive     * KLEBSIELLA PNEUMONIAE  Blood Culture ID Panel (Reflexed)     Status: Abnormal   Collection Time: 08/08/16  8:01 PM  Result Value Ref Range Status   Enterococcus species NOT DETECTED NOT DETECTED Final   Listeria monocytogenes NOT DETECTED NOT DETECTED Final   Staphylococcus species NOT DETECTED NOT DETECTED Final   Staphylococcus aureus NOT DETECTED NOT DETECTED Final   Streptococcus species NOT DETECTED NOT DETECTED Final   Streptococcus agalactiae NOT DETECTED NOT DETECTED Final   Streptococcus pneumoniae NOT DETECTED NOT DETECTED Final   Streptococcus pyogenes NOT DETECTED NOT DETECTED Final   Acinetobacter baumannii NOT DETECTED NOT DETECTED Final   Enterobacteriaceae species DETECTED (A) NOT DETECTED Final    Comment: Enterobacteriaceae represent a large family of gram-negative bacteria, not a single organism. CRITICAL RESULT CALLED TO, READ BACK BY AND VERIFIED WITH: Hughie Closs Pharm.D. 10:15 08/09/16 ([email or web address])    Enterobacter cloacae complex NOT DETECTED NOT DETECTED Final   Escherichia coli NOT DETECTED NOT DETECTED Final   Klebsiella oxytoca NOT DETECTED NOT DETECTED Final   Klebsiella pneumoniae DETECTED (A) NOT DETECTED Final    Comment: CRITICAL RESULT CALLED TO, READ BACK BY AND VERIFIED WITH: Hughie Closs Pharm.D. 10:15 08/09/16 ([email or web address])    Proteus species NOT DETECTED NOT DETECTED Final   Serratia marcescens NOT DETECTED NOT DETECTED Final   Carbapenem resistance NOT DETECTED NOT DETECTED Final   Haemophilus influenzae NOT DETECTED NOT DETECTED Final   Neisseria meningitidis NOT DETECTED NOT DETECTED Final   Pseudomonas aeruginosa NOT DETECTED NOT DETECTED Final   Candida albicans NOT DETECTED NOT DETECTED Final   Candida glabrata NOT DETECTED NOT DETECTED Final   Candida krusei NOT  DETECTED NOT DETECTED Final   Candida parapsilosis NOT DETECTED NOT DETECTED Final   Candida tropicalis NOT DETECTED NOT DETECTED Final  Urine culture     Status: Abnormal   Collection Time: 08/08/16  8:04 PM  Result Value Ref Range Status   Specimen Description URINE, RANDOM  Final   Special Requests NONE  Final   Culture MULTIPLE SPECIES PRESENT, SUGGEST RECOLLECTION (A)  Final   Report Status 08/10/2016 FINAL  Final  MRSA PCR Screening     Status: Abnormal   Collection Time: 08/09/16  4:07 AM  Result Value Ref Range Status   MRSA by PCR POSITIVE (A) NEGATIVE Final    Comment:  The GeneXpert MRSA Assay (FDA approved for NASAL specimens only), is one component of a comprehensive MRSA colonization surveillance program. It is not intended to diagnose MRSA infection nor to guide or monitor treatment for MRSA infections. RESULT CALLED TO, READ BACK BY AND VERIFIED WITH: [facility] RN AT 0254 08/09/16 BY A.DAVIS     Radiology Reports Ct Abdomen Pelvis Wo Contrast  Result Date: 08/11/2016 CLINICAL DATA:  Worsening right-sided pain and low-grade fever in a patient that is status post cholecystectomy. Chronic kidney disease. Diabetes. Endoscopic ultrasound 02/23/2015. ERCP 2 days ago. Cholecystectomy 12/21/2015. EXAM: CT ABDOMEN AND PELVIS WITHOUT CONTRAST TECHNIQUE: Multidetector CT imaging of the abdomen and pelvis was performed following the standard protocol without IV contrast. COMPARISON:  Ultrasound of 08/09/2016. CT of 08/08/2016. ERCP of 08/09/2016. FINDINGS: Lower chest: Clear lung bases. Cardiomegaly with small bilateral pleural effusions. Hepatobiliary: No focal liver lesion. Cholecystectomy. Moderate intrahepatic biliary duct dilatation persists. Pneumobilia is new. Interval placement of a distal common duct stent, which terminates in the descending duodenum Pancreas: Pancreatic atrophy. No duct dilatation. Cystic lesion in the pancreatic head measures 2.5 cm on image 26/  series 3 and is similar to on the prior. No evidence of acute pancreatitis. Spleen: Normal in size, without focal abnormality. Adrenals/Urinary Tract: Normal adrenal glands. Mild renal cortical thinning bilaterally. low-density left renal lesions are likely cysts. No hydronephrosis. Degraded evaluation of the pelvis, secondary to beam hardening artifact from left hip arthroplasty. Foley catheter in the urinary bladder. Stomach/Bowel: Normal stomach, without wall thickening. Scattered colonic diverticula. Normal terminal ileum. Duodenal diverticulum. Otherwise normal small bowel. Vascular/Lymphatic: Aortic and branch vessel atherosclerosis. No retroperitoneal or retrocrural adenopathy. Reproductive: Uterus and adnexa not well evaluated. Other: Trace pelvic fluid, including on image 72/series 3. No free intraperitoneal air. No abdominal ascites. Musculoskeletal: Left hip arthroplasty. Osteopenia. Convex left lumbar spine curvature. Advanced lumbosacral spondylosis. Minimal superior endplate compression deformity at L1 is unchanged. IMPRESSION: 1. Interval placement of a distal common duct stent. Persistent moderate intrahepatic biliary duct dilatation with new pneumobilia. Common duct not well evaluated. 2. No explanation for abdominal pain. 3. Degraded evaluation of the pelvis, secondary to beam hardening artifact from left hip arthroplasty. Trace cul-de-sac fluid is identified. 4.  Aortic atherosclerosis. 5. Small bilateral pleural effusions. 6. Similar size of a pancreatic head cystic lesion. Electronically Signed   By: Abigail Miyamoto M.D.   On: 08/11/2016 15:34   Ct Abdomen Pelvis Wo Contrast  Result Date: 08/09/2016 CLINICAL DATA:  Dyspnea and nausea starting today. Elevated lactic acid levels. EXAM: CT ABDOMEN AND PELVIS WITHOUT CONTRAST TECHNIQUE: Multidetector CT imaging of the abdomen and pelvis was performed following the standard protocol without IV contrast. COMPARISON:  01/03/2016 CT and chest CT  performed earlier on the same day FINDINGS: Lower chest: Borderline cardiomegaly with bibasilar atelectasis and/or scarring. No pericardial effusion. There is coronary arteriosclerosis. Hepatobiliary: Status post cholecystectomy with chronic stable dilatation of the intra and extrahepatic biliary ducts without calculus noted. The common bile duct is slightly more dilated on current exam up to 2.1 cm at the pancreatic head versus 1.7 cm in 2017. No mural thickening of the bowel ducts is identified. Lack of IV contrast limits assessment for mucosal enhancement. Patient reportedly received IV contrast earlier for a chest CT. On that exam, no definite mucosal enhancement is noted. Pancreas: Stable 2.7 cm cystic lesion in the proximal body of the pancreas. No ductal dilatation is noted. Spleen: Normal in size without focal abnormality. Adrenals/Urinary Tract: Normal bilateral adrenal glands. Small  left renal cysts are again visualized. Symmetric pyelograms noted bilaterally status post prior CT administration for the chest. No obstructive uropathy. Contrast opacified urine distended bladder. Stomach/Bowel: All the stomach and small intestine are normal. There is a moderate amount of fecal retention throughout large bowel with circular muscle hypertrophy and extensive sigmoid diverticulosis noted similar in appearance to prior. The appendix is not visualized but no pericecal inflammation is noted. Vascular/Lymphatic: Diffuse atherosclerosis of the abdominal aorta and branch vessels without aneurysm. No retroperitoneal or pelvic sidewall adenopathy. No inguinal lymphadenopathy. Reproductive: Uterus and bilateral adnexa are unremarkable. Other: No abdominal wall hernia or abnormality. No abdominopelvic ascites. Musculoskeletal: Thoracolumbar spondylosis with multilevel degenerative disc disease along the lumbar spine. Osteopenia is noted. No acute fracture nor bone destruction. IMPRESSION: 1. Coronary arteriosclerosis and  aortic atherosclerosis. 2. Extensive sigmoid diverticulosis without acute diverticulitis. 3. Status post cholecystectomy with chronic intrahepatic and extrahepatic ductal dilatation without mural thickening or enhancement seen on recent same day CT with IV contrast. No conclusive findings of cholangitis. 4. Stable 2.7 cm pancreatic head cyst and small left-sided renal cysts. 5. Spondylosis of the lumbar spine. Electronically Signed   By: Ashley Royalty M.D.   On: 08/09/2016 00:04   Ct Angio Chest Pe W Or Wo Contrast  Result Date: 08/08/2016 CLINICAL DATA:  83 year old with acute onset of chest pain, shortness of breath and nausea and vomiting that began earlier today. EXAM: CT ANGIOGRAPHY CHEST WITH CONTRAST TECHNIQUE: Multidetector CT imaging of the chest was performed using the standard protocol during bolus administration of intravenous contrast. Multiplanar CT image reconstructions and MIPs were obtained to evaluate the vascular anatomy. CONTRAST:  100 mL Isovue 370 IV. COMPARISON:  Unenhanced CT chest 01/13/2015. FINDINGS: Beam hardening streak artifact is present as the patient was unable to raise the arms over the head. Respiratory motion blurred many of the images. Cardiovascular: Contrast opacification of the pulmonary arteries is very good. No filling defects within either main pulmonary artery or their segmental branches in either lung to suggest pulmonary embolism. Heart moderately enlarged with left ventricular predominance. Severe LAD and right coronary artery atherosclerosis. No pericardial effusion. Severe atherosclerosis involving the thoracic and upper abdominal aorta without evidence of aneurysm or dissection. Gas is present within the right subclavian vein and the lower right internal jugular vein, possibly related to an intravenous injection in the right upper extremity. Mediastinum/Nodes: Scattered normal sized mediastinal lymph nodes. No pathologically enlarged mediastinal, hilar or axillary  lymph nodes. No mediastinal masses. Normal-appearing esophagus. Visualized thyroid gland unremarkable. Lungs/Pleura: Low lung volumes. Scattered areas of localized hyperlucency in both lungs indicating focal air trapping. Scarring in the lingula and in the lower lobes. No confluent airspace consolidation. No significant pulmonary parenchymal nodules or masses, as there is a stable subpleural nodule adjacent to the right major fissure (series 7, image 45), indicating a subpleural lymph node. No pleural effusions. Subpleural fibrosis is present bilaterally, most prominent in the lingula. Central airways patent. Upper Abdomen: Small hiatal hernia. Prior cholecystectomy which accounts for the dilated proximal common bile duct. Scarring and a simple cyst is present in the upper pole of the visualized left kidney. Visualized upper abdomen otherwise unremarkable for the early phase of enhancement. Musculoskeletal: Lower cervical degenerative disc disease and spondylosis. Osseous demineralization. No acute abnormalities. Review of the MIP images confirms the above findings. IMPRESSION: 1. No evidence of pulmonary embolism. 2. Moderate cardiomegaly. LAD and right coronary artery atherosclerosis. 3. Severe atherosclerosis involving the thoracic and upper abdominal aorta without aneurysm. 4.  Low lung volumes. Scattered areas of localized air trapping in both lungs as can be seen in patients with asthma or bronchitis. Chronic lung changes as detailed above No acute cardiopulmonary disease otherwise. 5. Small hiatal hernia. Electronically Signed   By: Evangeline Dakin M.D.   On: 08/08/2016 20:47   Nm Gi Blood Loss  Result Date: 08/12/2016 CLINICAL DATA:  Gastrointestinal bleeding EXAM: NUCLEAR MEDICINE GASTROINTESTINAL BLEEDING SCAN TECHNIQUE: Sequential abdominal images were obtained following intravenous administration of Tc-45m labeled red blood cells. Images were obtained over a 2 hour time span. RADIOPHARMACEUTICALS:   25.4 mCi Tc-50m in-vitro labeled red cells. COMPARISON:  CT abdomen and pelvis August 11, 2016 FINDINGS: Images were obtained over a 2 hour time span. During that time interval, there is no abnormal radiotracer uptake to suggest a focus of gastrointestinal bleeding. No abnormality appreciable. IMPRESSION: No abnormality appreciable. In particular, no demonstrable site of active gastroesophageal bleeding is demonstrated on this study. Electronically Signed   By: Lowella Grip III M.D.   On: 08/12/2016 13:33   Ir Angiogram Visceral Selective  Result Date: 08/14/2016 INDICATION: Lower GI bleed and migration of previously placed endoscopic biliary stent into proximal colon. Nuclear medicine bleeding scan yesterday did not show evidence of active bleeding and request has been made to perform arteriography due to persistent clinical bleeding. Colonoscopy demonstrates bright red blood in the colon without focal bleeding source demonstrated. EXAM: 1. ULTRASOUND GUIDANCE FOR VASCULAR ACCESS OF THE RIGHT COMMON FEMORAL ARTERY 2. SELECTIVE VISCERAL ARTERIOGRAPHY OF THE SUPERIOR MESENTERIC ARTERY 3. SELECTIVE ARTERIOGRAPHY OF LUMBAR ARTERY MEDICATIONS: 100 mcg intra-arterial nitroglycerin ANESTHESIA/SEDATION: Moderate (conscious) sedation was employed during this procedure. A total of Versed 1.5 mg and Fentanyl 25 mcg was administered intravenously. Moderate Sedation Time: 50 minutes. The patient's level of consciousness and vital signs were monitored continuously by radiology nursing throughout the procedure under my direct supervision. CONTRAST:  120 mL Isovue-300 FLUOROSCOPY TIME:  Fluoroscopy Time: 14 minutes 18 seconds. (171.6 mGy). COMPLICATIONS: None immediate. PROCEDURE: Informed consent was obtained from the patient's husband following explanation of the procedure, risks, benefits and alternatives. The patient's husband understands, agrees and consents for the procedure. All questions were addressed. A time out  was performed prior to the initiation of the procedure. Maximal barrier sterile technique utilized including caps, mask, sterile gowns, sterile gloves, large sterile drape, hand hygiene, and Betadine prep. The right common femoral artery was localized by ultrasound and patency confirmed by ultrasound. Access of the artery was performed under direct ultrasound guidance with a micropuncture set and ultrasound image documentation performed. After access of the right common femoral artery, a 5 French sheath was advanced over a guidewire. A 5 French Cobra catheter was then advanced into the abdominal aorta. This was used to selectively catheterize the superior mesenteric artery. The catheter was advanced into the trunk of the superior mesenteric artery and initial selective arteriography performed. Additional selective arteriography was then performed of the entire SMA territory after intra-arterial administration of 100 mcg nitroglycerin. Attempt to find the origin of the inferior mesenteric artery was then performed with the Cobra catheter. The Cobra catheter was then exchanged for a 5 [place] Sos catheter and attempts made to find and catheterize the inferior mesenteric artery origin. Arteriography of the distal aorta was performed through the Sos catheter. Eventually, a vessel was catheterized and selectively injected which corresponded to a hypertrophied lumbar artery. The Sos catheter was then removed over a guidewire. Hemostasis was obtained at the femoral access site with the Cordis  Exoseal device. FINDINGS: Initial arteriography of the superior mesenteric artery shows relative vasoconstriction with no evidence of arterial bleeding or other vascular abnormality. After administration of intra-arterial nitroglycerin, improved vasodilatation was present. Arteriography of the entire SMA supply demonstrates no evidence of active arterial bleeding, pseudoaneurysm, AV malformation or angiodysplasia. An endoscopic plastic  biliary stent is present in the ascending colon. No arterial bleeding is identified at the level of the stent. 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 An enlarged trunk was identified emanating off of the distal aorta which when catheterized represents a hypertrophied left-sided lumbar artery versus other retroperitoneal branch that supplies no bowel. IMPRESSION: No arterial source for gastrointestinal bleeding identified by arteriography of the superior mesenteric artery. No evidence of underlying arteriovenous malformation or angiodysplasia. At the level of a migrated endoscopic biliary stent within the ascending colon, no active bleeding is demonstrated. 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 Electronically Signed   By: Aletta Edouard M.D.   On: 08/14/2016 10:38   Ir Angio/spinal Left  Result Date: 08/14/2016 INDICATION: Lower GI bleed and migration of previously placed endoscopic biliary stent into proximal colon. Nuclear medicine bleeding scan yesterday did not show evidence of active bleeding and request has been made to perform arteriography due to persistent clinical bleeding. Colonoscopy demonstrates bright red blood in the colon without focal bleeding source demonstrated. EXAM: 1. ULTRASOUND GUIDANCE FOR VASCULAR ACCESS OF THE RIGHT COMMON FEMORAL ARTERY 2. SELECTIVE VISCERAL ARTERIOGRAPHY OF THE SUPERIOR MESENTERIC ARTERY 3. SELECTIVE ARTERIOGRAPHY OF LUMBAR ARTERY MEDICATIONS: 100 mcg intra-arterial nitroglycerin ANESTHESIA/SEDATION: Moderate (conscious) sedation was employed during this procedure. A total of Versed 1.5 mg and Fentanyl 25 mcg was administered intravenously. Moderate Sedation Time: 50 minutes. The patient's level of consciousness and vital signs were monitored continuously by radiology nursing throughout the procedure under my direct  supervision. CONTRAST:  120 mL Isovue-300 FLUOROSCOPY TIME:  Fluoroscopy Time: 14 minutes 18 seconds. (171.6 mGy). COMPLICATIONS: None immediate. PROCEDURE: Informed consent was obtained from the patient's husband following explanation of the procedure, risks, benefits and alternatives. The patient's husband understands, agrees and consents for the procedure. All questions were addressed. A time out was performed prior to the initiation of the procedure. Maximal barrier sterile technique utilized including caps, mask, sterile gowns, sterile gloves, large sterile drape, hand hygiene, and Betadine prep. The right common femoral artery was localized by ultrasound and patency confirmed by ultrasound. Access of the artery was performed under direct ultrasound guidance with a micropuncture set and ultrasound image documentation performed. After access of the right common femoral artery, a 5 French sheath was advanced over a guidewire. A 5 French Cobra catheter was then advanced into the abdominal aorta. This was used to selectively catheterize the superior mesenteric artery. The catheter was advanced into the trunk of the superior mesenteric artery and initial selective arteriography performed. Additional selective arteriography was then performed of the entire SMA territory after intra-arterial administration of 100 mcg nitroglycerin. Attempt to find the origin of the inferior mesenteric artery was then performed with the Cobra catheter. The Cobra catheter was then exchanged for a 5 [place] Sos catheter and attempts made to find and catheterize the inferior mesenteric artery origin. Arteriography of the distal aorta was performed through the Sos catheter. Eventually, a vessel was catheterized and selectively injected which corresponded to a hypertrophied lumbar artery. The Sos catheter was  then removed over a guidewire. Hemostasis was obtained at the femoral access site with the Cordis Exoseal device. FINDINGS: Initial  arteriography of the superior mesenteric artery shows relative vasoconstriction with no evidence of arterial bleeding or other vascular abnormality. After administration of intra-arterial nitroglycerin, improved vasodilatation was present. Arteriography of the entire SMA supply demonstrates no evidence of active arterial bleeding, pseudoaneurysm, AV malformation or angiodysplasia. An endoscopic plastic biliary stent is present in the ascending colon. No arterial bleeding is identified at the level of the stent. 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 An enlarged trunk was identified emanating off of the distal aorta which when catheterized represents a hypertrophied left-sided lumbar artery versus other retroperitoneal branch that supplies no bowel. IMPRESSION: No arterial source for gastrointestinal bleeding identified by arteriography of the superior mesenteric artery. No evidence of underlying arteriovenous malformation or angiodysplasia. At the level of a migrated endoscopic biliary stent within the ascending colon, no active bleeding is demonstrated. 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 Electronically Signed   By: Aletta Edouard M.D.   On: 08/14/2016 10:38   Ir US Guide Vasc Access Right  Result Date: 08/14/2016 INDICATION: Lower GI bleed and migration of previously placed endoscopic biliary stent into proximal colon. Nuclear medicine bleeding scan yesterday did not show evidence of active bleeding and request has been made to perform arteriography due to persistent clinical bleeding. Colonoscopy demonstrates bright red blood in the colon without focal bleeding source demonstrated. EXAM: 1. ULTRASOUND GUIDANCE FOR VASCULAR ACCESS OF THE RIGHT COMMON FEMORAL ARTERY 2. SELECTIVE VISCERAL ARTERIOGRAPHY OF THE SUPERIOR MESENTERIC ARTERY 3. SELECTIVE  ARTERIOGRAPHY OF LUMBAR ARTERY MEDICATIONS: 100 mcg intra-arterial nitroglycerin ANESTHESIA/SEDATION: Moderate (conscious) sedation was employed during this procedure. A total of Versed 1.5 mg and Fentanyl 25 mcg was administered intravenously. Moderate Sedation Time: 50 minutes. The patient's level of consciousness and vital signs were monitored continuously by radiology nursing throughout the procedure under my direct supervision. CONTRAST:  120 mL Isovue-300 FLUOROSCOPY TIME:  Fluoroscopy Time: 14 minutes 18 seconds. (171.6 mGy). COMPLICATIONS: None immediate. PROCEDURE: Informed consent was obtained from the patient's husband following explanation of the procedure, risks, benefits and alternatives. The patient's husband understands, agrees and consents for the procedure. All questions were addressed. A time out was performed prior to the initiation of the procedure. Maximal barrier sterile technique utilized including caps, mask, sterile gowns, sterile gloves, large sterile drape, hand hygiene, and Betadine prep. The right common femoral artery was localized by ultrasound and patency confirmed by ultrasound. Access of the artery was performed under direct ultrasound guidance with a micropuncture set and ultrasound image documentation performed. After access of the right common femoral artery, a 5 French sheath was advanced over a guidewire. A 5 French Cobra catheter was then advanced into the abdominal aorta. This was used to selectively catheterize the superior mesenteric artery. The catheter was advanced into the trunk of the superior mesenteric artery and initial selective arteriography performed. Additional selective arteriography was then performed of the entire SMA territory after intra-arterial administration of 100 mcg nitroglycerin. Attempt to find the origin of the inferior mesenteric artery was then performed with the Cobra catheter. The Cobra catheter was then exchanged for a 5 [place] Sos catheter  and attempts made to find and catheterize the inferior mesenteric artery origin. Arteriography of the distal aorta was performed through the Sos  catheter. Eventually, a vessel was catheterized and selectively injected which corresponded to a hypertrophied lumbar artery. The Sos catheter was then removed over a guidewire. Hemostasis was obtained at the femoral access site with the Cordis Exoseal device. FINDINGS: Initial arteriography of the superior mesenteric artery shows relative vasoconstriction with no evidence of arterial bleeding or other vascular abnormality. After administration of intra-arterial nitroglycerin, improved vasodilatation was present. Arteriography of the entire SMA supply demonstrates no evidence of active arterial bleeding, pseudoaneurysm, AV malformation or angiodysplasia. An endoscopic plastic biliary stent is present in the ascending colon. No arterial bleeding is identified at the level of the stent. 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 An enlarged trunk was identified emanating off of the distal aorta which when catheterized represents a hypertrophied left-sided lumbar artery versus other retroperitoneal branch that supplies no bowel. IMPRESSION: No arterial source for gastrointestinal bleeding identified by arteriography of the superior mesenteric artery. No evidence of underlying arteriovenous malformation or angiodysplasia. At the level of a migrated endoscopic biliary stent within the ascending colon, no active bleeding is demonstrated. 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 Electronically Signed   By: Aletta Edouard M.D.   On: 08/14/2016 10:38   Dg Chest Portable 1 View  Result Date: 08/08/2016 CLINICAL DATA:  Dyspnea and EXAM: PORTABLE CHEST 1 VIEW COMPARISON:  03/14/2016 FINDINGS: The heart size and mediastinal contours are  within normal limits. Aortic atherosclerosis is noted. No pneumonic consolidation, effusion or pneumothorax. Chronic subpleural interstitial prominence may reflect areas of fibrosis, scarring and/or chronic interstitial lung disease. Osteoarthritis of both glenohumeral joints with spurring and joint space narrowing. Slight remodeling along the undersurface of the distal acromion and clavicle on the left. This can be seen with chronic rotator cuff tear or rheumatoid arthritis. IMPRESSION: 1. Aortic atherosclerosis. 2. Mild stable interstitial prominence which may reflect subpleural fibrosis, scarring or sequela of chronic interstitial disease. 3. No acute pneumonic consolidation or CHF. Electronically Signed   By: Ashley Royalty M.D.   On: 08/08/2016 19:52   Dg Ercp  Result Date: 08/09/2016 CLINICAL DATA:  ERCP EXAM: ERCP TECHNIQUE: Multiple spot images obtained with the fluoroscopic device and submitted for interpretation post-procedure. COMPARISON:  CT abdomen pelvis - 08/08/2016 FINDINGS: 3 spot intraoperative fluoroscopic images of the right upper abdominal quadrant during ERCP are provided for review Initial image demonstrates an ERCP probe overlying the right upper abdominal quadrant. Post cholecystectomy. There is selective cannulation opacification of the common bile duct which appears markedly dilated compatible with the findings seen on preceding abdominal CT. There is no definitive opacification of the pancreatic or residual cystic ducts. There is no significant opacification of the intrahepatic biliary system. No definitive filling defects are seen with the opacified portions of the common bile duct to suggest the presence of choledocholithiasis. Completion image demonstrates placement of a internal biliary stent overlying the distal aspect of the common bile duct. IMPRESSION: ERCP with biliary stent placement as above. These images were submitted for radiologic interpretation only. Please see the  procedural report for the amount of contrast and the fluoroscopy time utilized. Electronically Signed   By: Sandi Mariscal M.D.   On: 08/09/2016 14:36   Dg Abd 2 Views  Result Date: 08/14/2016 CLINICAL DATA:  Images obtained for Displacement of biliary stent. No current abdominal complaints. EXAM: ABDOMEN - 2 VIEW COMPARISON:  CT abdomen  08/11/2016 FINDINGS: There is a biliary stent which is located in the right mid abdomen to the right of the L4 vertebral body in a transverse orientation concerning for displacement from the common bile duct. There is no bowel dilatation to suggest obstruction. There is no evidence of pneumoperitoneum, portal venous gas or pneumatosis. There are no pathologic calcifications along the expected course of the ureters. Left total hip arthroplasty. Levoscoliosis of the lumbar spine. Lumbar spine spondylosis. IMPRESSION: There is a biliary stent which is located in the right mid abdomen to the right of the L4 vertebral body in a transverse orientation concerning for displacement from the common bile duct. If there is further clinical concern regarding the exact location of the biliary stent, recommend evaluation with a CT of the abdomen. Electronically Signed   By: Kathreen Devoid   On: 08/14/2016 15:08   Dg Abd 2 Views  Result Date: 08/13/2016 CLINICAL DATA:  83 year old female with displaced biliary stent. Subsequent encounter. EXAM: ABDOMEN - 2 VIEW COMPARISON:  08/12/2016 plain film exam.  08/11/2016 CT. FINDINGS: Biliary stent has shifted position slightly now located within the right lower abdomen/ right upper pelvis. It is possible this is in the right colon. No findings of bowel obstruction or free intraperitoneal air. Gas within the biliary system felt to be related to stent placement. Degenerative changes lumbar spine convex left. Post left hip replacement. Post cholecystectomy. IMPRESSION: Biliary stent has shifted position slightly now located within the right lower abdomen/  right upper pelvis. It is possible this is in the right colon. Electronically Signed   By: Genia Del M.D.   On: 08/13/2016 13:29   Dg Abd 2 Views  Result Date: 08/12/2016 CLINICAL DATA:  Displacement of biliary stent. EXAM: ABDOMEN - 2 VIEW COMPARISON:  CT of the abdomen and pelvis 08/11/2016 FINDINGS: The biliary stent is noted in the right-sided the abdomen, projecting over the ascending colon on all images. Levoconvex curvature is present in the lumbar spine. Atherosclerotic changes are noted. A partial left hip replacement is noted. IMPRESSION: The biliary stent projects over the right side of the abdomen, potentially within the ascending colon. Electronically Signed   By: San Morelle M.D.   On: 08/12/2016 21:30   US Abdomen Limited Ruq  Result Date: 08/10/2016 CLINICAL DATA:  Cholangitis. ERCP earlier this day with stent placement. EXAM: ULTRASOUND ABDOMEN LIMITED RIGHT UPPER QUADRANT COMPARISON:  Abdominal CT yesterday. FINDINGS: Gallbladder: Surgically absent. Common bile duct: Diameter: 17 mm at the porta hepatis. 2.2 x 2.5 cm distally. Biliary stent is visualized. Liver: No focal lesion identified. Linear echogenic shadowing is likely pneumobilia related to recent ERCP. Normal directional flow in the main portal vein. 2.6 cm cyst in the pancreatic head, as seen on CT. IMPRESSION: 1. Biliary dilatation with biliary stent visualized in the common bile duct. The degree of biliary dilatation appears similar to CT yesterday. 2. Linear echogenic shadowing structures in the liver likely pneumobilia. Electronically Signed   By: Jeb Levering M.D.   On: 08/10/2016 02:27    Signature  Lala Lund M.D on 08/17/2016 at 8:37 AM  Between 7am to 7pm - Pager - [phone] ( page via [email or web address], text pages only, please mention full 10 digit call back number).  After 7pm go to [email or web address] - password [email or web address]

## 2016-08-17 NOTE — Progress Notes (Signed)
[facility] Gastroenterology Progress Note  [name] 83 y.o. 05/15/1929  CC:  Cholangitis/ GI bleed   Subjective: Discussed with the nursing staff. No further evidence of active bleeding. Hemoglobin stable. Patient is complaining of decrease in appetite. Hemoglobin stable  ROS : Negative for chest pain or shortness of breath.   Objective: Vital signs in last 24 hours: Vitals:   08/17/16 0600 08/17/16 0832  BP: (!) 131/45 (!) 168/67  Pulse: 79 79  Resp: 19 (!) 21  Temp:  99.5 F (37.5 C)    Physical Exam:  General:  Alert, cooperative, no distress, appears stated age. Dry lips noted   Head:  Normocephalic, without obvious abnormality, atraumatic  Eyes:  , EOM's intact,Mild scleral icterus   Lungs:   Clear to auscultation bilaterally, respirations unlabored. Anterior exam only   Heart:  Regular rate and rhythm, S1, S2 normal  Abdomen:   Abdomen soft,NT, nondistended Bowel sounds present. No peritoneal sign.   Extremities: Extremities normal, atraumatic, no  edema       Lab Results:  Recent Labs  08/16/16 0458 08/17/16 0620  NA 141 139  K 4.5 4.0  CL 118* 113*  CO2 20* 22  GLUCOSE 132* 124*  BUN 6 6  CREATININE 0.76 0.79  CALCIUM 7.7* 8.4*    Recent Labs  08/15/16 0600  AST 18  ALT 59*  ALKPHOS 58  BILITOT 0.8  PROT 3.4*  ALBUMIN 1.8*    Recent Labs  08/15/16 0600  08/17/16 0056 08/17/16 0620  WBC 6.6  --   --  6.4  HGB 8.1*  < > 8.5* 8.8*  HCT 23.7*  < > 26.2* 26.7*  MCV 87.5  --   --  91.8  PLT 93*  --   --  166  < > = values in this interval not displayed.  Recent Labs  08/17/16 0620  LABPROT 14.7  INR 1.14      Assessment/Plan: - GI bleed with Initial dark stools and subsequent  bright right red blood per rectum. Negative EGD , negative bleeding scan, negative angiogram flexible sigmoidoscopy showed fresh and clotted blood. Repeat colonoscopy 06/13 showed friable polypoid lesion in the cecum near the ileocecal wall with oozing of blood  which was treated with epinephrine injection and 1 clip placement. Biopsy showed inflammatory hyperplastic polyp  -   Ascending cholangitis. Status post ERCP with sphincterotomy,  removal of pus and plastic stent placement. LFTs trending down.  - Displacement of the previously placed biliary stent. X-ray showed stent possibly in ascending colon. Status post removal with snare yesterday during colonoscopy - Gram-negative bacteremia - Sepsis.   Recommendations ------------------------- - Biopsy showed inflammatory hyperplastic polyp. No further bleeding episodes according to nursing staff. Hemoglobin remains stable. - Patient is  83 year old with multiple comorbidities. She remains at high risk for post-polypectomy bleeding after removal of inflammatory hyperplastic polyp in the cecum. May be reasonable to observe patient rather than attempting endoscopic removal. - We will discuss this further in outpatient setting when patient is more stable - GI will sign off. Call us back if needed. - Follow-up in GI clinic in 6-8 weeks after discharge    Otis Brace MD, [place] 08/17/2016, 9:33 AM  Pager [phone]  If no answer or after 5 PM call [phone]

## 2016-08-17 NOTE — Progress Notes (Signed)
Physical Therapy Treatment Patient Details Name: [name] MRN: [id number] DOB: 24-Jun-1929 Today's Date: 08/17/2016    History of Present Illness 83 y.o.female admitted 08/08/16 with fever and epigastric pain.  Patient with bloody stools. S/P ERCP with CBD stent.  EGD negative for blood 6/11. s/p colonscopy 6/13 with found polyps that were clipped.  PMHx Bipolar disorder, HTN, DM, CKD stage III ( baseline Cr 1.1), CHF, and chronic pain (interstitial cystitis and chronic back pain)    PT Comments    Patient seen for mobility progression. Remains limited by weakness and fatigue. Ambulate din hall with RW and multiple rest breaks. VSS. Current recommendation is for HHPT with family providing 24/7 assist,  If family is unable to provide adequate assist, may need to consider ST SNF.   Follow Up Recommendations  Home health PT;Supervision/Assistance - 24 hour (if 24/7 assist unavailable will need SNF)     Equipment Recommendations  None recommended by PT    Recommendations for Other Services       Precautions / Restrictions Precautions Precautions: Fall Restrictions Weight Bearing Restrictions: No    Mobility  Bed Mobility Overal bed mobility: Needs Assistance Bed Mobility: Sit to Supine Rolling: Min assist Sidelying to sit: Min assist;HOB elevated Supine to sit: Min assist Sit to supine: Mod assist;HOB elevated   General bed mobility comments: Assist to bring LEs into bed to return to supine. Able to help adjusting bottom by pushing through heels.  Transfers Overall transfer level: Needs assistance Equipment used: Rolling walker (2 wheeled) Transfers: Sit to/from Stand Sit to Stand: Min assist         General transfer comment: Min assist for stability, posterior LOB noted (performed x3 during session)  Ambulation/Gait Ambulation/Gait assistance: Min guard;Min assist Ambulation Distance (Feet): 120 Feet Assistive device: Rolling walker (2 wheeled) Gait  Pattern/deviations: Step-through pattern;Decreased stride length;Shuffle;Trunk flexed Gait velocity: decreased Gait velocity interpretation: Below normal speed for age/gender General Gait Details: 3 standing rest breaks, min guard with VC's for upright posture. gait speed varies. 3/4 DOE. Fatigues with ambulation and requires increased assist upon fatigue. HR 100s   Stairs            Wheelchair Mobility    Modified Rankin (Stroke Patients Only)       Balance Overall balance assessment: Needs assistance Sitting-balance support: Feet supported;No upper extremity supported Sitting balance-Leahy Scale: Fair     Standing balance support: During functional activity Standing balance-Leahy Scale: Poor Standing balance comment: Reliant on BUEs for support in standing.                             Cognition Arousal/Alertness: Awake/alert Behavior During Therapy: Flat affect Overall Cognitive Status: Within Functional Limits for tasks assessed                                        Exercises      General Comments        Pertinent Vitals/Pain Pain Assessment: Faces Faces Pain Scale: Hurts little more Pain Location: bottom and low back regiond Pain Descriptors / Indicators: Sore;Aching;Constant Pain Intervention(s): Monitored during session;Repositioned    Home Living                      Prior Function            PT Goals (  current goals can now be found in the care plan section) Acute Rehab PT Goals Patient Stated Goal: To go home PT Goal Formulation: With patient Time For Goal Achievement: 08/18/16 Potential to Achieve Goals: Good Progress towards PT goals: Progressing toward goals    Frequency    Min 3X/week      PT Plan Current plan remains appropriate    Co-evaluation              AM-PAC PT "6 Clicks" Daily Activity  Outcome Measure  Difficulty turning over in bed (including adjusting bedclothes, sheets  and blankets)?: None Difficulty moving from lying on back to sitting on the side of the bed? : Total Difficulty sitting down on and standing up from a chair with arms (e.g., wheelchair, bedside commode, etc,.)?: None Help needed moving to and from a bed to chair (including a wheelchair)?: A Little Help needed walking in hospital room?: A Little Help needed climbing 3-5 steps with a railing? : A Little 6 Click Score: 18    End of Session Equipment Utilized During Treatment: Gait belt Activity Tolerance: Patient tolerated treatment well Patient left: in bed;with call bell/phone within reach Nurse Communication: Mobility status PT Visit Diagnosis: Difficulty in walking, not elsewhere classified (R26.2);Muscle weakness (generalized) (M62.81) Pain - Right/Left: Right Pain - part of body:  (upper abdomen)     Time: 7096-2836 PT Time Calculation (min) (ACUTE ONLY): 19 min  Charges:  $Gait Training: 8-22 mins                    G Codes:       Alben Deeds, PT DPT  [place] 08/17/2016, 4:15 PM

## 2016-08-17 NOTE — Progress Notes (Signed)
Nutrition Follow-up  DOCUMENTATION CODES:   Not applicable  INTERVENTION:   -D/c Ensure Enlive po BID, each supplement provides 350 kcal and 20 grams of protein -Glucerna Shake po TID, each supplement provides 220 kcal and 10 grams of protein -MVI daily  NUTRITION DIAGNOSIS:   Inadequate oral intake related to poor appetite as evidenced by meal completion < 50%, per patient/family report.  Ongoing  GOAL:   Patient will meet greater than or equal to 90% of their needs  Unmet  MONITOR:   PO intake, Supplement acceptance, Labs, Weight trends, Skin, I & O's  REASON FOR ASSESSMENT:   Malnutrition Screening Tool    ASSESSMENT:   [name] is a 83 y.o. female with a past medical history significant for Bipolar disorder, HTN, NIDDM, CKD baseline Cr 1.1, HFpEF and chronic pain (interstitial cystitis and chronic back pain) who presents with fever and epigastric pain.  6/10-s/p EGD- no source of GIB, normal duodenal dulb with duodenal diverticulum 6/11- s/p flexible sigmoid scopy, revealed blood in rectum, recto-sigmoid colon, and sigmoid colon, diverticulosis in sigmoid colin, and non-bleeding hemorrhoids, 6/11- s/p slective SMA arteriography  Per GI notes, biopsy showed inflammatory hyperplastic polyp.  Spoke with pt, who complains of ongoing poor appetite. She is unable to provide time frame for this other than "it's been like that for a long time". Meal completion 0-30%. Breakfast tray untouched. Pt does not like Ensure supplements, however, tolerates Glucerna supplements well.   Nutrition-Focused physical exam completed. Findings are mild (upper arm only) fat depletion, mild (calf only) muscle depletion, and moderate edema.   Discussed importance of good meal intake to promote healing. Pt amenable to Glucerna supplements.  Labs reviewed: CBGS: 97-138.  Diet Order:  DIET SOFT Room service appropriate? Yes; Fluid consistency: Thin  Skin:  Reviewed, no issues  Last BM:   08/16/16  Height:   Ht Readings from Last 1 Encounters:  08/10/16 5\' 1"  (1.549 m)    Weight:   Wt Readings from Last 1 Encounters:  08/10/16 149 lb 7.6 oz (67.8 kg)    Ideal Body Weight:  56.8 kg  BMI:  Body mass index is 28.24 kg/m.  Estimated Nutritional Needs:   Kcal:  1450-1650  Protein:  70-85 grams  Fluid:  1.4-1.6 L  EDUCATION NEEDS:   Education needs addressed  Martavis Gurney A. Jimmye Norman, RD, LDN, CDE Pager: [phone] After hours Pager: [phone]

## 2016-08-18 DIAGNOSIS — R1010 Upper abdominal pain, unspecified: Secondary | ICD-10-CM

## 2016-08-18 LAB — GLUCOSE, CAPILLARY
GLUCOSE-CAPILLARY: 166 mg/dL — AB (ref 65–99)
Glucose-Capillary: 120 mg/dL — ABNORMAL HIGH (ref 65–99)

## 2016-08-18 LAB — HEMOGLOBIN AND HEMATOCRIT, BLOOD
HEMATOCRIT: 26.3 % — AB (ref 36.0–46.0)
HEMATOCRIT: 27.5 % — AB (ref 36.0–46.0)
HEMOGLOBIN: 9 g/dL — AB (ref 12.0–15.0)
Hemoglobin: 8.5 g/dL — ABNORMAL LOW (ref 12.0–15.0)

## 2016-08-18 MED ORDER — ASPIRIN EC 81 MG PO TBEC: 81.0000 mg | DELAYED_RELEASE_TABLET | Freq: Every day | ORAL | Status: AC

## 2016-08-18 MED ORDER — FERROUS SULFATE 325 (65 FE) MG PO TABS: 325.0000 mg | ORAL_TABLET | Freq: Two times a day (BID) | ORAL | 0 refills | Status: AC

## 2016-08-18 NOTE — Progress Notes (Signed)
Nsg Discharge Note  Admit Date:  08/08/2016 Discharge date: 08/18/2016   [name] to be D/C'd Home per MD order.  AVS completed.  Copy for chart, and copy for patient signed, and dated. Patient/caregiver able to verbalize understanding.  Discharge Medication: Allergies as of 08/18/2016      Reactions   Codeine Other (See Comments)   slurred speech      Medication List    TAKE these medications   acetaminophen 500 MG tablet Commonly known as:  TYLENOL Take 500 mg by mouth every 6 (six) hours as needed for mild pain.   amLODipine 5 MG tablet Commonly known as:  NORVASC Take 1 tablet (5 mg total) by mouth daily.   aspirin EC 81 MG tablet Take 1 tablet (81 mg total) by mouth daily. Start taking on:  08/21/2016 What changed:  These instructions start on 08/21/2016. If you are unsure what to do until then, ask your doctor or other care provider.   atorvastatin 10 MG tablet Commonly known as:  LIPITOR Take 5 mg by mouth daily.   cholecalciferol 1000 units tablet Commonly known as:  VITAMIN D Take 2,000 Units by mouth daily.   conjugated estrogens vaginal cream Commonly known as:  PREMARIN Place 1 Applicatorful vaginally daily as needed (burning).   cyanocobalamin 1000 MCG/ML injection Commonly known as:  (VITAMIN B-12) Inject 1,000 mcg into the muscle every 30 (thirty) days.   ferrous sulfate 325 (65 FE) MG tablet Take 1 tablet (325 mg total) by mouth 2 (two) times daily with a meal.   FLUoxetine 20 MG tablet Commonly known as:  PROZAC Take 20 mg by mouth daily.   furosemide 20 MG tablet Commonly known as:  LASIX Take 2 tablets (40 mg total) by mouth daily. What changed:  how much to take   glimepiride 1 MG tablet Commonly known as:  AMARYL Take 2 tablets (2 mg total) by mouth every morning.   lisinopril 20 MG tablet Commonly known as:  PRINIVIL,ZESTRIL Take 1 tablet (20 mg total) by mouth every evening.   lithium carbonate 150 MG capsule Take 150 mg by mouth  daily.   metoprolol tartrate 50 MG tablet Commonly known as:  LOPRESSOR Take 1 tablet (50 mg total) by mouth 2 (two) times daily.   mirtazapine 30 MG tablet Commonly known as:  REMERON Take 30 mg by mouth at bedtime.   nitroGLYCERIN 0.4 MG SL tablet Commonly known as:  NITROSTAT Place 1 tablet (0.4 mg total) under the tongue every 5 (five) minutes as needed for chest pain.   omeprazole 40 MG capsule Commonly known as:  PRILOSEC Take 40 mg by mouth every evening.   oxybutynin 5 MG tablet Commonly known as:  DITROPAN Take 5 mg by mouth 2 (two) times daily.   traMADol 50 MG tablet Commonly known as:  ULTRAM Take 1 tablet (50 mg total) by mouth 2 (two) times daily as needed (pain). What changed:  when to take this   zolpidem 5 MG tablet Commonly known as:  AMBIEN Take 2.5 mg by mouth at bedtime.       Discharge Assessment: Vitals:   08/17/16 2353 08/18/16 0455  BP: (!) 146/47 (!) 151/52  Pulse: 78 69  Resp:  17  Temp:  98.4 F (36.9 C)   Skin clean, dry and intact without evidence of skin break down, no evidence of skin tears noted. IV catheter discontinued intact. Site without signs and symptoms of complications - no redness or edema  noted at insertion site, patient denies c/o pain - only slight tenderness at site.  Dressing with slight pressure applied.  D/c Instructions-Education: Discharge instructions given to patient/family with verbalized understanding. D/c education completed with patient/family including follow up instructions, medication list, d/c activities limitations if indicated, with other d/c instructions as indicated by MD - patient able to verbalize understanding, all questions fully answered. Patient instructed to return to ED, call 911, or call MD for any changes in condition.  Patient escorted via [place], and D/C home via private auto.  Salley Slaughter, RN 08/18/2016 12:29 PM

## 2016-08-18 NOTE — Discharge Instructions (Signed)
Follow with Primary MD Lavone Orn, MD in 3 days   Get CBC, CMP, 2 view Chest X ray checked  by Primary MD  in 3 days ( we routinely change or add medications that can affect your baseline labs and fluid status, therefore we recommend that you get the mentioned basic workup next visit with your PCP, your PCP may decide not to get them or add new tests based on their clinical decision)  Activity: As tolerated with Full fall precautions use walker/cane & assistance as needed  Disposition Home   Diet: Heart Healthy    For Heart failure patients - Check your Weight same time everyday, if you gain over 2 pounds, or you develop in leg swelling, experience more shortness of breath or chest pain, call your Primary MD immediately. Follow Cardiac Low Salt Diet and 1.5 lit/day fluid restriction.  On your next visit with your primary care physician please Get Medicines reviewed and adjusted.  Please request your Prim.MD to go over all Hospital Tests and Procedure/Radiological results at the follow up, please get all Hospital records sent to your Prim MD by signing hospital release before you go home.  If you experience worsening of your admission symptoms, develop shortness of breath, life threatening emergency, suicidal or homicidal thoughts you must seek medical attention immediately by calling 911 or calling your MD immediately  if symptoms less severe.  You Must read complete instructions/literature along with all the possible adverse reactions/side effects for all the Medicines you take and that have been prescribed to you. Take any new Medicines after you have completely understood and accpet all the possible adverse reactions/side effects.   Do not drive, operate heavy machinery, perform activities at heights, swimming or participation in water activities or provide baby sitting services if your were admitted for syncope or siezures until you have seen by Primary MD or a Neurologist and advised to  do so again.  Do not drive when taking Pain medications.    Do not take more than prescribed Pain, Sleep and Anxiety Medications  Special Instructions: If you have smoked or chewed Tobacco  in the last 2 yrs please stop smoking, stop any regular Alcohol  and or any Recreational drug use.  Wear Seat belts while driving.   Please note  You were cared for by a hospitalist during your hospital stay. If you have any questions about your discharge medications or the care you received while you were in the hospital after you are discharged, you can call the unit and asked to speak with the hospitalist on call if the hospitalist that took care of you is not available. Once you are discharged, your primary care physician will handle any further medical issues. Please note that NO REFILLS for any discharge medications will be authorized once you are discharged, as it is imperative that you return to your primary care physician (or establish a relationship with a primary care physician if you do not have one) for your aftercare needs so that they can reassess your need for medications and monitor your lab values.

## 2016-08-18 NOTE — Care Management Note (Signed)
Case Management Note  Patient Details  Name: [name] MRN: [id number] Date of Birth: Jul 22, 1929  Subjective/Objective:                  Fever and epigastric pain Action/Plan: Discharge planning Expected Discharge Date:  08/18/16               Expected Discharge Plan:  [place]  In-House Referral:     Discharge planning Services  CM Consult  Post Acute Care Choice:    Choice offered to:  Patient  DME Arranged:  N/A DME Agency:     HH Arranged:  PT, RN, Nurse's Aide [place] Agency:  [facility]  Status of Service:  Completed, signed off  If discussed at [place] of Stay Meetings, dates discussed:    Additional Comments: CM notes previous CM, Sam set up pt with [facility] for HHRN/PT/aide.  This CM notified [facility] rep, Jermaine pt to be discharged home today.  No DME recommended nor ordered. No other CM needs were communicated. Dellie Catholic, RN 08/18/2016, 10:52 AM

## 2016-08-18 NOTE — Discharge Summary (Signed)
[name] BCW:[id number] DOB: 1930/01/18 DOA: 08/08/2016  PCP: Lavone Orn, MD  Admit date: 08/08/2016  Discharge date: 08/18/2016  Admitted From: Home   Disposition:  Home   Recommendations for Outpatient Follow-up:   Follow up with PCP in 1-2 weeks  PCP Please obtain BMP/CBC, 2 view CXR in 1week,  (see Discharge instructions)   PCP Please follow up on the following pending results: Monitor H&H, Aspirin closely   Home Health: PT-RN   Equipment/Devices: None  Consultations: GI Discharge Condition: Stable   CODE STATUS: Full   Diet Recommendation:  Heart Healthy    Chief Complaint  Patient presents with  . Chest Pain     Brief history of present illness from the day of admission and additional interim summary    83 y.o.WF PMHx Bipolar disorder, HTN, DM, CKD stage III (baseline Cr 1.1), CHF, and chronic pain (interstitial cystitis and chronic back pain)Who presentedwith fever and epigastric pain. She was found to have cholangitis, status post ERCP 08/09/2016, patient started to have dark bloody bowel movement 08/11/2016, will she has been transfused total of 4 units PRBC, repeat endoscopy 6/10 with no evidence of active GI bleed, as well with negative bleeding scan, she continues to have active bleeding, Flexible sigmoidoscopy with evidence of fresh and old blood, but could not identify source, went for angiogram by IR which could not identify source of bleed as well, continues requiring frequent transfusion with active GI bleed.                                                                 Hospital Course    Sepsis due to Klebsiella bacteremia/Cholangitis And transaminitis  Clinically improved from this standpoint, she is status post EGD with CBD stent placement, CBD stent post procedure has migrated and  eventually was removed with colonoscopy on 08/15/2016, patient's LFTs have normalized and she is now symptom-free. Clinically this problem has resolved. GI on board, he has finished antibiotics for Klebsiella bacteremia, infection has completely defervesced.  GI bleed with Acute blood loss anemia - EGD was unremarkable and there was a cecal polyp which was bleeding and clipped via colonoscopy by GI on 08/15/2016, H&H for now has stabilized with no further evidence of bleeding, will be discharged home with outpatient GI follow-up, aspirin to be resumed from 08/22/2016, PCP to monitor H&H closely once aspirin as resumed. Note general surgery was also involved in case bleeding reoccurred, patient and daughter now agreeable for surgery if needed in the future.   Elevated troponin - This was demand ischemia due to anemia as above, troponin trend was flat and in non-ACS pattern, cannot get aspirin due to GI bleed, repeat echocardiogram done this admission shows EF of 55-60% with no regional wall motion abnormality. Continue to follow. Patient chest pain-free.  CKD stage III (Baseline creatinine 1.1)  creatinine stable close to baseline.  Chronic diastolic congestive heart failure - clinically compensated  Essential HTN - continue home regimen unchanged.  Hypokalemia. Replaced & satble.   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 - Continue with home regimen PCP to monitor, A1c was 6.2    Discharge diagnosis     Principal Problem:   Sepsis ([place]) Active Problems:   CKD (chronic kidney disease), stage III   Chronic diastolic congestive heart failure (HCC)   Essential hypertension, malignant   Diabetes mellitus with complication (HCC)   Bipolar disorder (HCC)   Recurrent cholangitis   Elevated troponin    Discharge instructions    Discharge Instructions    Diet - low sodium heart healthy    Complete by:  As directed    Discharge instructions    Complete by:  As directed     Follow with Primary MD Lavone Orn, MD in 3 days   Get CBC, CMP, 2 view Chest X ray checked  by Primary MD  in 3 days ( we routinely change or add medications that can affect your baseline labs and fluid status, therefore we recommend that you get the mentioned basic workup next visit with your PCP, your PCP may decide not to get them or add new tests based on their clinical decision)  Activity: As tolerated with Full fall precautions use walker/cane & assistance as needed  Disposition Home   Diet: Heart Healthy    For Heart failure patients - Check your Weight same time everyday, if you gain over 2 pounds, or you develop in leg swelling, experience more shortness of breath or chest pain, call your Primary MD immediately. Follow Cardiac Low Salt Diet and 1.5 lit/day fluid restriction.  On your next visit with your primary care physician please Get Medicines reviewed and adjusted.  Please request your Prim.MD to go over all Hospital Tests and Procedure/Radiological results at the follow up, please get all Hospital records sent to your Prim MD by signing hospital release before you go home.  If you experience worsening of your admission symptoms, develop shortness of breath, life threatening emergency, suicidal or homicidal thoughts you must seek medical attention immediately by calling 911 or calling your MD immediately  if symptoms less severe.  You Must read complete instructions/literature along with all the possible adverse reactions/side effects for all the Medicines you take and that have been prescribed to you. Take any new Medicines after you have completely understood and accpet all the possible adverse reactions/side effects.   Do not drive, operate heavy machinery, perform activities at heights, swimming or participation in water activities or provide baby sitting services if your were admitted for syncope or siezures until you have seen by Primary MD or a Neurologist and advised to  do so again.  Do not drive when taking Pain medications.    Do not take more than prescribed Pain, Sleep and Anxiety Medications  Special Instructions: If you have smoked or chewed Tobacco  in the last 2 yrs please stop smoking, stop any regular Alcohol  and or any Recreational drug use.  Wear Seat belts while driving.   Please note  You were cared for by a hospitalist during your hospital stay. If you have any questions about your discharge medications or the care you received while you were in the hospital after you are discharged, you can call the unit and asked to speak with the hospitalist on call if  the hospitalist that took care of you is not available. Once you are discharged, your primary care physician will handle any further medical issues. Please note that NO REFILLS for any discharge medications will be authorized once you are discharged, as it is imperative that you return to your primary care physician (or establish a relationship with a primary care physician if you do not have one) for your aftercare needs so that they can reassess your need for medications and monitor your lab values.   Increase activity slowly    Complete by:  As directed       Discharge Medications   Allergies as of 08/18/2016      Reactions   Codeine Other (See Comments)   slurred speech      Medication List    TAKE these medications   acetaminophen 500 MG tablet Commonly known as:  TYLENOL Take 500 mg by mouth every 6 (six) hours as needed for mild pain.   amLODipine 5 MG tablet Commonly known as:  NORVASC Take 1 tablet (5 mg total) by mouth daily.   aspirin EC 81 MG tablet Take 1 tablet (81 mg total) by mouth daily. Start taking on:  08/21/2016 What changed:  These instructions start on 08/21/2016. If you are unsure what to do until then, ask your doctor or other care provider.   atorvastatin 10 MG tablet Commonly known as:  LIPITOR Take 5 mg by mouth daily.   cholecalciferol 1000  units tablet Commonly known as:  VITAMIN D Take 2,000 Units by mouth daily.   conjugated estrogens vaginal cream Commonly known as:  PREMARIN Place 1 Applicatorful vaginally daily as needed (burning).   cyanocobalamin 1000 MCG/ML injection Commonly known as:  (VITAMIN B-12) Inject 1,000 mcg into the muscle every 30 (thirty) days.   ferrous sulfate 325 (65 FE) MG tablet Take 1 tablet (325 mg total) by mouth 2 (two) times daily with a meal.   FLUoxetine 20 MG tablet Commonly known as:  PROZAC Take 20 mg by mouth daily.   furosemide 20 MG tablet Commonly known as:  LASIX Take 2 tablets (40 mg total) by mouth daily. What changed:  how much to take   glimepiride 1 MG tablet Commonly known as:  AMARYL Take 2 tablets (2 mg total) by mouth every morning.   lisinopril 20 MG tablet Commonly known as:  PRINIVIL,ZESTRIL Take 1 tablet (20 mg total) by mouth every evening.   lithium carbonate 150 MG capsule Take 150 mg by mouth daily.   metoprolol tartrate 50 MG tablet Commonly known as:  LOPRESSOR Take 1 tablet (50 mg total) by mouth 2 (two) times daily.   mirtazapine 30 MG tablet Commonly known as:  REMERON Take 30 mg by mouth at bedtime.   nitroGLYCERIN 0.4 MG SL tablet Commonly known as:  NITROSTAT Place 1 tablet (0.4 mg total) under the tongue every 5 (five) minutes as needed for chest pain.   omeprazole 40 MG capsule Commonly known as:  PRILOSEC Take 40 mg by mouth every evening.   oxybutynin 5 MG tablet Commonly known as:  DITROPAN Take 5 mg by mouth 2 (two) times daily.   traMADol 50 MG tablet Commonly known as:  ULTRAM Take 1 tablet (50 mg total) by mouth 2 (two) times daily as needed (pain). What changed:  when to take this   zolpidem 5 MG tablet Commonly known as:  AMBIEN Take 2.5 mg by mouth at bedtime.       Follow-up Information  [facility] Follow [facility].   Why:  Physical Therapy, nurse and aide Contact information: [address] [place] [address] [phone]        Lavone Orn, MD. Schedule an appointment as soon as possible for a visit in 1 day(s).   Specialty:  Internal Medicine Contact information: [address] [place] [address] [place] [address] [phone]        Otis Brace, MD. Schedule an appointment as soon as possible for a visit in 1 week(s).   Specialty:  Gastroenterology Contact information: [place] [address] [phone]           Major procedures and Radiology Reports - PLEASE review detailed and final reports thoroughly  -     6/7 ERCP- diffusely dilated CBD (2cm) w/ no visible filling defect - sphincterotomy performed leading to drainage of pus - CBD stent placed plastic CBD stent 5 cm 7 French   6/10 repeat EGDwith no evidence of active GI bleed  6/11 Mesenteric Angiography  by Dr. Kathlene Cote  6/13 Colonoscopy - GI bleed with Initial dark stools but now right red blood per rectum. Negative EGD for active bleeding yesterday.       Ct Abdomen Pelvis Wo Contrast  Result Date: 08/11/2016 CLINICAL DATA:  Worsening right-sided pain and low-grade fever in a patient that is status post cholecystectomy. Chronic kidney disease. Diabetes. Endoscopic ultrasound 02/23/2015. ERCP 2 days ago. Cholecystectomy 12/21/2015. EXAM: CT ABDOMEN AND PELVIS WITHOUT CONTRAST TECHNIQUE: Multidetector CT imaging of the abdomen and pelvis was performed following the standard protocol without IV contrast. COMPARISON:  Ultrasound of 08/09/2016. CT of 08/08/2016. ERCP of 08/09/2016. FINDINGS: Lower chest: Clear lung bases. Cardiomegaly with small bilateral pleural effusions. Hepatobiliary: No focal liver lesion. Cholecystectomy. Moderate intrahepatic biliary duct dilatation persists. Pneumobilia is new. Interval placement of a distal common duct stent, which terminates in the descending duodenum Pancreas: Pancreatic atrophy. No duct dilatation. Cystic  lesion in the pancreatic head measures 2.5 cm on image 26/ series 3 and is similar to on the prior. No evidence of acute pancreatitis. Spleen: Normal in size, without focal abnormality. Adrenals/Urinary Tract: Normal adrenal glands. Mild renal cortical thinning bilaterally. low-density left renal lesions are likely cysts. No hydronephrosis. Degraded evaluation of the pelvis, secondary to beam hardening artifact from left hip arthroplasty. Foley catheter in the urinary bladder. Stomach/Bowel: Normal stomach, without wall thickening. Scattered colonic diverticula. Normal terminal ileum. Duodenal diverticulum. Otherwise normal small bowel. Vascular/Lymphatic: Aortic and branch vessel atherosclerosis. No retroperitoneal or retrocrural adenopathy. Reproductive: Uterus and adnexa not well evaluated. Other: Trace pelvic fluid, including on image 72/series 3. No free intraperitoneal air. No abdominal ascites. Musculoskeletal: Left hip arthroplasty. Osteopenia. Convex left lumbar spine curvature. Advanced lumbosacral spondylosis. Minimal superior endplate compression deformity at L1 is unchanged. IMPRESSION: 1. Interval placement of a distal common duct stent. Persistent moderate intrahepatic biliary duct dilatation with new pneumobilia. Common duct not well evaluated. 2. No explanation for abdominal pain. 3. Degraded evaluation of the pelvis, secondary to beam hardening artifact from left hip arthroplasty. Trace cul-de-sac fluid is identified. 4.  Aortic atherosclerosis. 5. Small bilateral pleural effusions. 6. Similar size of a pancreatic head cystic lesion. Electronically Signed   By: Abigail Miyamoto M.D.   On: 08/11/2016 15:34   Ct Abdomen Pelvis Wo Contrast  Result Date: 08/09/2016 CLINICAL DATA:  Dyspnea and nausea starting today. Elevated lactic acid levels. EXAM: CT ABDOMEN AND PELVIS WITHOUT CONTRAST TECHNIQUE: Multidetector CT imaging of  the abdomen and pelvis was performed following the standard protocol without  IV contrast. COMPARISON:  01/03/2016 CT and chest CT performed earlier on the same day FINDINGS: Lower chest: Borderline cardiomegaly with bibasilar atelectasis and/or scarring. No pericardial effusion. There is coronary arteriosclerosis. Hepatobiliary: Status post cholecystectomy with chronic stable dilatation of the intra and extrahepatic biliary ducts without calculus noted. The common bile duct is slightly more dilated on current exam up to 2.1 cm at the pancreatic head versus 1.7 cm in 2017. No mural thickening of the bowel ducts is identified. Lack of IV contrast limits assessment for mucosal enhancement. Patient reportedly received IV contrast earlier for a chest CT. On that exam, no definite mucosal enhancement is noted. Pancreas: Stable 2.7 cm cystic lesion in the proximal body of the pancreas. No ductal dilatation is noted. Spleen: Normal in size without focal abnormality. Adrenals/Urinary Tract: Normal bilateral adrenal glands. Small left renal cysts are again visualized. Symmetric pyelograms noted bilaterally status post prior CT administration for the chest. No obstructive uropathy. Contrast opacified urine distended bladder. Stomach/Bowel: All the stomach and small intestine are normal. There is a moderate amount of fecal retention throughout large bowel with circular muscle hypertrophy and extensive sigmoid diverticulosis noted similar in appearance to prior. The appendix is not visualized but no pericecal inflammation is noted. Vascular/Lymphatic: Diffuse atherosclerosis of the abdominal aorta and branch vessels without aneurysm. No retroperitoneal or pelvic sidewall adenopathy. No inguinal lymphadenopathy. Reproductive: Uterus and bilateral adnexa are unremarkable. Other: No abdominal wall hernia or abnormality. No abdominopelvic ascites. Musculoskeletal: Thoracolumbar spondylosis with multilevel degenerative disc disease along the lumbar spine. Osteopenia is noted. No acute fracture nor bone  destruction. IMPRESSION: 1. Coronary arteriosclerosis and aortic atherosclerosis. 2. Extensive sigmoid diverticulosis without acute diverticulitis. 3. Status post cholecystectomy with chronic intrahepatic and extrahepatic ductal dilatation without mural thickening or enhancement seen on recent same day CT with IV contrast. No conclusive findings of cholangitis. 4. Stable 2.7 cm pancreatic head cyst and small left-sided renal cysts. 5. Spondylosis of the lumbar spine. Electronically Signed   By: Ashley Royalty M.D.   On: 08/09/2016 00:04   Ct Angio Chest Pe W Or Wo Contrast  Result Date: 08/08/2016 CLINICAL DATA:  83 year old with acute onset of chest pain, shortness of breath and nausea and vomiting that began earlier today. EXAM: CT ANGIOGRAPHY CHEST WITH CONTRAST TECHNIQUE: Multidetector CT imaging of the chest was performed using the standard protocol during bolus administration of intravenous contrast. Multiplanar CT image reconstructions and MIPs were obtained to evaluate the vascular anatomy. CONTRAST:  100 mL Isovue 370 IV. COMPARISON:  Unenhanced CT chest 01/13/2015. FINDINGS: Beam hardening streak artifact is present as the patient was unable to raise the arms over the head. Respiratory motion blurred many of the images. Cardiovascular: Contrast opacification of the pulmonary arteries is very good. No filling defects within either main pulmonary artery or their segmental branches in either lung to suggest pulmonary embolism. Heart moderately enlarged with left ventricular predominance. Severe LAD and right coronary artery atherosclerosis. No pericardial effusion. Severe atherosclerosis involving the thoracic and upper abdominal aorta without evidence of aneurysm or dissection. Gas is present within the right subclavian vein and the lower right internal jugular vein, possibly related to an intravenous injection in the right upper extremity. Mediastinum/Nodes: Scattered normal sized mediastinal lymph nodes.  No pathologically enlarged mediastinal, hilar or axillary lymph nodes. No mediastinal masses. Normal-appearing esophagus. Visualized thyroid gland unremarkable. Lungs/Pleura: Low lung volumes. Scattered areas of localized hyperlucency in both lungs indicating  focal air trapping. Scarring in the lingula and in the lower lobes. No confluent airspace consolidation. No significant pulmonary parenchymal nodules or masses, as there is a stable subpleural nodule adjacent to the right major fissure (series 7, image 45), indicating a subpleural lymph node. No pleural effusions. Subpleural fibrosis is present bilaterally, most prominent in the lingula. Central airways patent. Upper Abdomen: Small hiatal hernia. Prior cholecystectomy which accounts for the dilated proximal common bile duct. Scarring and a simple cyst is present in the upper pole of the visualized left kidney. Visualized upper abdomen otherwise unremarkable for the early phase of enhancement. Musculoskeletal: Lower cervical degenerative disc disease and spondylosis. Osseous demineralization. No acute abnormalities. Review of the MIP images confirms the above findings. IMPRESSION: 1. No evidence of pulmonary embolism. 2. Moderate cardiomegaly. LAD and right coronary artery atherosclerosis. 3. Severe atherosclerosis involving the thoracic and upper abdominal aorta without aneurysm. 4. Low lung volumes. Scattered areas of localized air trapping in both lungs as can be seen in patients with asthma or bronchitis. Chronic lung changes as detailed above No acute cardiopulmonary disease otherwise. 5. Small hiatal hernia. Electronically Signed   By: Evangeline Dakin M.D.   On: 08/08/2016 20:47   Nm Gi Blood Loss  Result Date: 08/12/2016 CLINICAL DATA:  Gastrointestinal bleeding EXAM: NUCLEAR MEDICINE GASTROINTESTINAL BLEEDING SCAN TECHNIQUE: Sequential abdominal images were obtained following intravenous administration of Tc-84m labeled red blood cells. Images were  obtained over a 2 hour time span. RADIOPHARMACEUTICALS:  25.4 mCi Tc-62m in-vitro labeled red cells. COMPARISON:  CT abdomen and pelvis August 11, 2016 FINDINGS: Images were obtained over a 2 hour time span. During that time interval, there is no abnormal radiotracer uptake to suggest a focus of gastrointestinal bleeding. No abnormality appreciable. IMPRESSION: No abnormality appreciable. In particular, no demonstrable site of active gastroesophageal bleeding is demonstrated on this study. Electronically Signed   By: Lowella Grip III M.D.   On: 08/12/2016 13:33   Ir Angiogram Visceral Selective  Result Date: 08/14/2016 INDICATION: Lower GI bleed and migration of previously placed endoscopic biliary stent into proximal colon. Nuclear medicine bleeding scan yesterday did not show evidence of active bleeding and request has been made to perform arteriography due to persistent clinical bleeding. Colonoscopy demonstrates bright red blood in the colon without focal bleeding source demonstrated. EXAM: 1. ULTRASOUND GUIDANCE FOR VASCULAR ACCESS OF THE RIGHT COMMON FEMORAL ARTERY 2. SELECTIVE VISCERAL ARTERIOGRAPHY OF THE SUPERIOR MESENTERIC ARTERY 3. SELECTIVE ARTERIOGRAPHY OF LUMBAR ARTERY MEDICATIONS: 100 mcg intra-arterial nitroglycerin ANESTHESIA/SEDATION: Moderate (conscious) sedation was employed during this procedure. A total of Versed 1.5 mg and Fentanyl 25 mcg was administered intravenously. Moderate Sedation Time: 50 minutes. The patient's level of consciousness and vital signs were monitored continuously by radiology nursing throughout the procedure under my direct supervision. CONTRAST:  120 mL Isovue-300 FLUOROSCOPY TIME:  Fluoroscopy Time: 14 minutes 18 seconds. (171.6 mGy). COMPLICATIONS: None immediate. PROCEDURE: Informed consent was obtained from the patient's husband following explanation of the procedure, risks, benefits and alternatives. The patient's husband understands, agrees and consents for  the procedure. All questions were addressed. A time out was performed prior to the initiation of the procedure. Maximal barrier sterile technique utilized including caps, mask, sterile gowns, sterile gloves, large sterile drape, hand hygiene, and Betadine prep. The right common femoral artery was localized by ultrasound and patency confirmed by ultrasound. Access of the artery was performed under direct ultrasound guidance with a micropuncture set and ultrasound image documentation performed. After access of the right common  femoral artery, a 5 French sheath was advanced over a guidewire. A 5 French Cobra catheter was then advanced into the abdominal aorta. This was used to selectively catheterize the superior mesenteric artery. The catheter was advanced into the trunk of the superior mesenteric artery and initial selective arteriography performed. Additional selective arteriography was then performed of the entire SMA territory after intra-arterial administration of 100 mcg nitroglycerin. Attempt to find the origin of the inferior mesenteric artery was then performed with the Cobra catheter. The Cobra catheter was then exchanged for a 5 [place] Sos catheter and attempts made to find and catheterize the inferior mesenteric artery origin. Arteriography of the distal aorta was performed through the Sos catheter. Eventually, a vessel was catheterized and selectively injected which corresponded to a hypertrophied lumbar artery. The Sos catheter was then removed over a guidewire. Hemostasis was obtained at the femoral access site with the Cordis Exoseal device. FINDINGS: Initial arteriography of the superior mesenteric artery shows relative vasoconstriction with no evidence of arterial bleeding or other vascular abnormality. After administration of intra-arterial nitroglycerin, improved vasodilatation was present. Arteriography of the entire SMA supply demonstrates no evidence of active arterial bleeding, pseudoaneurysm,  AV malformation or angiodysplasia. An endoscopic plastic biliary stent is present in the ascending colon. No arterial bleeding is identified at the level of the stent. 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 An enlarged trunk was identified emanating off of the distal aorta which when catheterized represents a hypertrophied left-sided lumbar artery versus other retroperitoneal branch that supplies no bowel. IMPRESSION: No arterial source for gastrointestinal bleeding identified by arteriography of the superior mesenteric artery. No evidence of underlying arteriovenous malformation or angiodysplasia. At the level of a migrated endoscopic biliary stent within the ascending colon, no active bleeding is demonstrated. 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 Electronically Signed   By: Aletta Edouard M.D.   On: 08/14/2016 10:38   Ir Angio/spinal Left  Result Date: 08/14/2016 INDICATION: Lower GI bleed and migration of previously placed endoscopic biliary stent into proximal colon. Nuclear medicine bleeding scan yesterday did not show evidence of active bleeding and request has been made to perform arteriography due to persistent clinical bleeding. Colonoscopy demonstrates bright red blood in the colon without focal bleeding source demonstrated. EXAM: 1. ULTRASOUND GUIDANCE FOR VASCULAR ACCESS OF THE RIGHT COMMON FEMORAL ARTERY 2. SELECTIVE VISCERAL ARTERIOGRAPHY OF THE SUPERIOR MESENTERIC ARTERY 3. SELECTIVE ARTERIOGRAPHY OF LUMBAR ARTERY MEDICATIONS: 100 mcg intra-arterial nitroglycerin ANESTHESIA/SEDATION: Moderate (conscious) sedation was employed during this procedure. A total of Versed 1.5 mg and Fentanyl 25 mcg was administered intravenously. Moderate Sedation Time: 50 minutes. The patient's level of consciousness and vital signs were monitored continuously by  radiology nursing throughout the procedure under my direct supervision. CONTRAST:  120 mL Isovue-300 FLUOROSCOPY TIME:  Fluoroscopy Time: 14 minutes 18 seconds. (171.6 mGy). COMPLICATIONS: None immediate. PROCEDURE: Informed consent was obtained from the patient's husband following explanation of the procedure, risks, benefits and alternatives. The patient's husband understands, agrees and consents for the procedure. All questions were addressed. A time out was performed prior to the initiation of the procedure. Maximal barrier sterile technique utilized including caps, mask, sterile gowns, sterile gloves, large sterile drape, hand hygiene, and Betadine prep. The right common femoral artery was localized by ultrasound and patency confirmed by ultrasound. Access of the artery was performed under direct ultrasound  guidance with a micropuncture set and ultrasound image documentation performed. After access of the right common femoral artery, a 5 French sheath was advanced over a guidewire. A 5 French Cobra catheter was then advanced into the abdominal aorta. This was used to selectively catheterize the superior mesenteric artery. The catheter was advanced into the trunk of the superior mesenteric artery and initial selective arteriography performed. Additional selective arteriography was then performed of the entire SMA territory after intra-arterial administration of 100 mcg nitroglycerin. Attempt to find the origin of the inferior mesenteric artery was then performed with the Cobra catheter. The Cobra catheter was then exchanged for a 5 [place] Sos catheter and attempts made to find and catheterize the inferior mesenteric artery origin. Arteriography of the distal aorta was performed through the Sos catheter. Eventually, a vessel was catheterized and selectively injected which corresponded to a hypertrophied lumbar artery. The Sos catheter was then removed over a guidewire. Hemostasis was obtained at the femoral  access site with the Cordis Exoseal device. FINDINGS: Initial arteriography of the superior mesenteric artery shows relative vasoconstriction with no evidence of arterial bleeding or other vascular abnormality. After administration of intra-arterial nitroglycerin, improved vasodilatation was present. Arteriography of the entire SMA supply demonstrates no evidence of active arterial bleeding, pseudoaneurysm, AV malformation or angiodysplasia. An endoscopic plastic biliary stent is present in the ascending colon. No arterial bleeding is identified at the level of the stent. 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 An enlarged trunk was identified emanating off of the distal aorta which when catheterized represents a hypertrophied left-sided lumbar artery versus other retroperitoneal branch that supplies no bowel. IMPRESSION: No arterial source for gastrointestinal bleeding identified by arteriography of the superior mesenteric artery. No evidence of underlying arteriovenous malformation or angiodysplasia. At the level of a migrated endoscopic biliary stent within the ascending colon, no active bleeding is demonstrated. 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 Electronically Signed   By: Aletta Edouard M.D.   On: 08/14/2016 10:38   Ir US Guide Vasc Access Right  Result Date: 08/14/2016 INDICATION: Lower GI bleed and migration of previously placed endoscopic biliary stent into proximal colon. Nuclear medicine bleeding scan yesterday did not show evidence of active bleeding and request has been made to perform arteriography due to persistent clinical bleeding. Colonoscopy demonstrates bright red blood in the colon without focal bleeding source demonstrated. EXAM: 1. ULTRASOUND GUIDANCE FOR VASCULAR ACCESS OF THE RIGHT COMMON FEMORAL ARTERY 2. SELECTIVE VISCERAL  ARTERIOGRAPHY OF THE SUPERIOR MESENTERIC ARTERY 3. SELECTIVE ARTERIOGRAPHY OF LUMBAR ARTERY MEDICATIONS: 100 mcg intra-arterial nitroglycerin ANESTHESIA/SEDATION: Moderate (conscious) sedation was employed during this procedure. A total of Versed 1.5 mg and Fentanyl 25 mcg was administered intravenously. Moderate Sedation Time: 50 minutes. The patient's level of consciousness and vital signs were monitored continuously by radiology nursing throughout the procedure under my direct supervision. CONTRAST:  120 mL Isovue-300 FLUOROSCOPY TIME:  Fluoroscopy Time: 14 minutes 18 seconds. (171.6 mGy). COMPLICATIONS: None immediate. PROCEDURE: Informed consent was obtained from the patient's husband following explanation of the procedure, risks, benefits and alternatives. The patient's husband understands, agrees and consents for the procedure. All questions were addressed. A time out was performed prior to the initiation of the procedure. Maximal barrier sterile technique utilized including caps, mask, sterile gowns, sterile gloves, large sterile drape, hand hygiene, and Betadine prep. The right common femoral  artery was localized by ultrasound and patency confirmed by ultrasound. Access of the artery was performed under direct ultrasound guidance with a micropuncture set and ultrasound image documentation performed. After access of the right common femoral artery, a 5 French sheath was advanced over a guidewire. A 5 French Cobra catheter was then advanced into the abdominal aorta. This was used to selectively catheterize the superior mesenteric artery. The catheter was advanced into the trunk of the superior mesenteric artery and initial selective arteriography performed. Additional selective arteriography was then performed of the entire SMA territory after intra-arterial administration of 100 mcg nitroglycerin. Attempt to find the origin of the inferior mesenteric artery was then performed with the Cobra catheter. The  Cobra catheter was then exchanged for a 5 [place] Sos catheter and attempts made to find and catheterize the inferior mesenteric artery origin. Arteriography of the distal aorta was performed through the Sos catheter. Eventually, a vessel was catheterized and selectively injected which corresponded to a hypertrophied lumbar artery. The Sos catheter was then removed over a guidewire. Hemostasis was obtained at the femoral access site with the Cordis Exoseal device. FINDINGS: Initial arteriography of the superior mesenteric artery shows relative vasoconstriction with no evidence of arterial bleeding or other vascular abnormality. After administration of intra-arterial nitroglycerin, improved vasodilatation was present. Arteriography of the entire SMA supply demonstrates no evidence of active arterial bleeding, pseudoaneurysm, AV malformation or angiodysplasia. An endoscopic plastic biliary stent is present in the ascending colon. No arterial bleeding is identified at the level of the stent. 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 An enlarged trunk was identified emanating off of the distal aorta which when catheterized represents a hypertrophied left-sided lumbar artery versus other retroperitoneal branch that supplies no bowel. IMPRESSION: No arterial source for gastrointestinal bleeding identified by arteriography of the superior mesenteric artery. No evidence of underlying arteriovenous malformation or angiodysplasia. At the level of a migrated endoscopic biliary stent within the ascending colon, no active bleeding is demonstrated. 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 Electronically Signed   By: Aletta Edouard M.D.   On: 08/14/2016 10:38   Dg Chest Portable 1 View  Result Date: 08/08/2016 CLINICAL DATA:  Dyspnea and EXAM: PORTABLE CHEST 1 VIEW COMPARISON:   03/14/2016 FINDINGS: The heart size and mediastinal contours are within normal limits. Aortic atherosclerosis is noted. No pneumonic consolidation, effusion or pneumothorax. Chronic subpleural interstitial prominence may reflect areas of fibrosis, scarring and/or chronic interstitial lung disease. Osteoarthritis of both glenohumeral joints with spurring and joint space narrowing. Slight remodeling along the undersurface of the distal acromion and clavicle on the left. This can be seen with chronic rotator cuff tear or rheumatoid arthritis. IMPRESSION: 1. Aortic atherosclerosis. 2. Mild stable interstitial prominence which may reflect subpleural fibrosis, scarring or sequela of chronic interstitial disease. 3. No acute pneumonic consolidation or CHF. Electronically Signed   By: Ashley Royalty M.D.   On: 08/08/2016 19:52   Dg Ercp  Result Date: 08/09/2016 CLINICAL DATA:  ERCP EXAM: ERCP TECHNIQUE: Multiple spot images obtained with the fluoroscopic device and submitted for interpretation post-procedure. COMPARISON:  CT abdomen pelvis - 08/08/2016 FINDINGS: 3 spot intraoperative fluoroscopic images of the right upper abdominal quadrant during ERCP are provided for review Initial image demonstrates an ERCP probe overlying the right upper abdominal quadrant. Post cholecystectomy. There is selective cannulation opacification of the common  bile duct which appears markedly dilated compatible with the findings seen on preceding abdominal CT. There is no definitive opacification of the pancreatic or residual cystic ducts. There is no significant opacification of the intrahepatic biliary system. No definitive filling defects are seen with the opacified portions of the common bile duct to suggest the presence of choledocholithiasis. Completion image demonstrates placement of a internal biliary stent overlying the distal aspect of the common bile duct. IMPRESSION: ERCP with biliary stent placement as above. These images were  submitted for radiologic interpretation only. Please see the procedural report for the amount of contrast and the fluoroscopy time utilized. Electronically Signed   By: Sandi Mariscal M.D.   On: 08/09/2016 14:36   Dg Abd 2 Views  Result Date: 08/14/2016 CLINICAL DATA:  Images obtained for Displacement of biliary stent. No current abdominal complaints. EXAM: ABDOMEN - 2 VIEW COMPARISON:  CT abdomen 08/11/2016 FINDINGS: There is a biliary stent which is located in the right mid abdomen to the right of the L4 vertebral body in a transverse orientation concerning for displacement from the common bile duct. There is no bowel dilatation to suggest obstruction. There is no evidence of pneumoperitoneum, portal venous gas or pneumatosis. There are no pathologic calcifications along the expected course of the ureters. Left total hip arthroplasty. Levoscoliosis of the lumbar spine. Lumbar spine spondylosis. IMPRESSION: There is a biliary stent which is located in the right mid abdomen to the right of the L4 vertebral body in a transverse orientation concerning for displacement from the common bile duct. If there is further clinical concern regarding the exact location of the biliary stent, recommend evaluation with a CT of the abdomen. Electronically Signed   By: Kathreen Devoid   On: 08/14/2016 15:08   Dg Abd 2 Views  Result Date: 08/13/2016 CLINICAL DATA:  83 year old female with displaced biliary stent. Subsequent encounter. EXAM: ABDOMEN - 2 VIEW COMPARISON:  08/12/2016 plain film exam.  08/11/2016 CT. FINDINGS: Biliary stent has shifted position slightly now located within the right lower abdomen/ right upper pelvis. It is possible this is in the right colon. No findings of bowel obstruction or free intraperitoneal air. Gas within the biliary system felt to be related to stent placement. Degenerative changes lumbar spine convex left. Post left hip replacement. Post cholecystectomy. IMPRESSION: Biliary stent has shifted  position slightly now located within the right lower abdomen/ right upper pelvis. It is possible this is in the right colon. Electronically Signed   By: Genia Del M.D.   On: 08/13/2016 13:29   Dg Abd 2 Views  Result Date: 08/12/2016 CLINICAL DATA:  Displacement of biliary stent. EXAM: ABDOMEN - 2 VIEW COMPARISON:  CT of the abdomen and pelvis 08/11/2016 FINDINGS: The biliary stent is noted in the right-sided the abdomen, projecting over the ascending colon on all images. Levoconvex curvature is present in the lumbar spine. Atherosclerotic changes are noted. A partial left hip replacement is noted. IMPRESSION: The biliary stent projects over the right side of the abdomen, potentially within the ascending colon. Electronically Signed   By: San Morelle M.D.   On: 08/12/2016 21:30   US Abdomen Limited Ruq  Result Date: 08/10/2016 CLINICAL DATA:  Cholangitis. ERCP earlier this day with stent placement. EXAM: ULTRASOUND ABDOMEN LIMITED RIGHT UPPER QUADRANT COMPARISON:  Abdominal CT yesterday. FINDINGS: Gallbladder: Surgically absent. Common bile duct: Diameter: 17 mm at the porta hepatis. 2.2 x 2.5 cm distally. Biliary stent is visualized. Liver: No focal lesion identified. Linear echogenic  shadowing is likely pneumobilia related to recent ERCP. Normal directional flow in the main portal vein. 2.6 cm cyst in the pancreatic head, as seen on CT. IMPRESSION: 1. Biliary dilatation with biliary stent visualized in the common bile duct. The degree of biliary dilatation appears similar to CT yesterday. 2. Linear echogenic shadowing structures in the liver likely pneumobilia. Electronically Signed   By: Jeb Levering M.D.   On: 08/10/2016 02:27    Micro Results      Recent Results (from the past 240 hour(s))  Blood Culture (routine x 2)     Status: Abnormal   Collection Time: 08/08/16  7:20 PM  Result Value Ref Range Status   Specimen Description BLOOD RIGHT WRIST  Final   Special Requests    Final    BOTTLES DRAWN AEROBIC AND ANAEROBIC Blood Culture adequate volume   Culture  Setup Time   Final    GRAM NEGATIVE RODS IN BOTH AEROBIC AND ANAEROBIC BOTTLES CRITICAL VALUE NOTED.  VALUE IS CONSISTENT WITH PREVIOUSLY REPORTED AND CALLED VALUE.    Culture (A)  Final    KLEBSIELLA PNEUMONIAE SUSCEPTIBILITIES PERFORMED ON PREVIOUS CULTURE WITHIN THE LAST 5 DAYS.    Report Status 08/11/2016 FINAL  Final  Blood Culture (routine x 2)     Status: Abnormal   Collection Time: 08/08/16  8:01 PM  Result Value Ref Range Status   Specimen Description BLOOD LEFT ANTECUBITAL  Final   Special Requests IN PEDIATRIC BOTTLE Blood Culture adequate volume  Final   Culture  Setup Time   Final    GRAM NEGATIVE RODS IN PEDIATRIC BOTTLE Organism ID to follow CRITICAL RESULT CALLED TO, READ BACK BY AND VERIFIED WITH: Hughie Closs Pharm.D. 10:15 08/09/16 ([email or web address])    Culture KLEBSIELLA PNEUMONIAE (A)  Final   Report Status 08/11/2016 FINAL  Final   Organism ID, Bacteria KLEBSIELLA PNEUMONIAE  Final      Susceptibility   Klebsiella pneumoniae - MIC*    AMPICILLIN >=32 RESISTANT Resistant     CEFAZOLIN <=4 SENSITIVE Sensitive     CEFEPIME <=1 SENSITIVE Sensitive     CEFTAZIDIME <=1 SENSITIVE Sensitive     CEFTRIAXONE <=1 SENSITIVE Sensitive     CIPROFLOXACIN <=0.25 SENSITIVE Sensitive     GENTAMICIN <=1 SENSITIVE Sensitive     IMIPENEM <=0.25 SENSITIVE Sensitive     TRIMETH/SULFA <=20 SENSITIVE Sensitive     AMPICILLIN/SULBACTAM 4 SENSITIVE Sensitive     PIP/TAZO <=4 SENSITIVE Sensitive     Extended ESBL NEGATIVE Sensitive     * KLEBSIELLA PNEUMONIAE  Blood Culture ID Panel (Reflexed)     Status: Abnormal   Collection Time: 08/08/16  8:01 PM  Result Value Ref Range Status   Enterococcus species NOT DETECTED NOT DETECTED Final   Listeria monocytogenes NOT DETECTED NOT DETECTED Final   Staphylococcus species NOT DETECTED NOT DETECTED Final   Staphylococcus aureus NOT DETECTED NOT DETECTED Final    Streptococcus species NOT DETECTED NOT DETECTED Final   Streptococcus agalactiae NOT DETECTED NOT DETECTED Final   Streptococcus pneumoniae NOT DETECTED NOT DETECTED Final   Streptococcus pyogenes NOT DETECTED NOT DETECTED Final   Acinetobacter baumannii NOT DETECTED NOT DETECTED Final   Enterobacteriaceae species DETECTED (A) NOT DETECTED Final    Comment: Enterobacteriaceae represent a large family of gram-negative bacteria, not a single organism. CRITICAL RESULT CALLED TO, READ BACK BY AND VERIFIED WITH: Hughie Closs Pharm.D. 10:15 08/09/16 ([email or web address])    Enterobacter cloacae complex NOT DETECTED NOT DETECTED Final   Escherichia  coli NOT DETECTED NOT DETECTED Final   Klebsiella oxytoca NOT DETECTED NOT DETECTED Final   Klebsiella pneumoniae DETECTED (A) NOT DETECTED Final    Comment: CRITICAL RESULT CALLED TO, READ BACK BY AND VERIFIED WITH: M. Prentiss Bells Pharm.D. 10:15 08/09/16 ([email or web address])    Proteus species NOT DETECTED NOT DETECTED Final   Serratia marcescens NOT DETECTED NOT DETECTED Final   Carbapenem resistance NOT DETECTED NOT DETECTED Final   Haemophilus influenzae NOT DETECTED NOT DETECTED Final   Neisseria meningitidis NOT DETECTED NOT DETECTED Final   Pseudomonas aeruginosa NOT DETECTED NOT DETECTED Final   Candida albicans NOT DETECTED NOT DETECTED Final   Candida glabrata NOT DETECTED NOT DETECTED Final   Candida krusei NOT DETECTED NOT DETECTED Final   Candida parapsilosis NOT DETECTED NOT DETECTED Final   Candida tropicalis NOT DETECTED NOT DETECTED Final  Urine culture     Status: Abnormal   Collection Time: 08/08/16  8:04 PM  Result Value Ref Range Status   Specimen Description URINE, RANDOM  Final   Special Requests NONE  Final   Culture MULTIPLE SPECIES PRESENT, SUGGEST RECOLLECTION (A)  Final   Report Status 08/10/2016 FINAL  Final  MRSA PCR Screening     Status: Abnormal   Collection Time: 08/09/16  4:07 AM  Result Value Ref Range Status   MRSA by PCR POSITIVE (A)  NEGATIVE Final    Comment:        The GeneXpert MRSA Assay (FDA approved for NASAL specimens only), is one component of a comprehensive MRSA colonization surveillance program. It is not intended to diagnose MRSA infection nor to guide or monitor treatment for MRSA infections. RESULT CALLED TO, READ BACK BY AND VERIFIED WITH: D.CORREA RN AT 0621 08/09/16 BY A.DAVIS     Today   Subjective    [name] today has no headache,no chest abdominal pain,no new weakness tingling or numbness, feels much better wants to go home today.    Objective   Blood pressure (!) 151/52, pulse 69, temperature 98.4 F (36.9 C), temperature source Oral, resp. rate 17, height 5\' 1"  (1.549 m), weight 67.8 kg (149 lb 7.6 oz), SpO2 93 %.   Intake/Output Summary (Last 24 hours) at 08/18/16 1057 Last data filed at 08/18/16 0933  Gross per 24 hour  Intake              699 ml  Output             1200 ml  Net             -501 ml    Exam Awake Alert, Oriented x 3, No new F.N deficits, Normal affect [place].AT,PERRAL Supple Neck,No JVD, No cervical lymphadenopathy appriciated.  Symmetrical Chest wall movement, Good air movement bilaterally, CTAB RRR,No Gallops,Rubs or new Murmurs, No Parasternal Heave +ve B.Sounds, Abd Soft, Non tender, No organomegaly appriciated, No rebound -guarding or rigidity. No Cyanosis, Clubbing or edema, No new Rash or bruise   Data Review   CBC w Diff:  Lab Results  Component Value Date   WBC 6.4 08/17/2016   HGB 8.5 (L) 08/18/2016   HCT 26.3 (L) 08/18/2016   PLT 166 08/17/2016   LYMPHOPCT 31 08/13/2016   MONOPCT 10 08/13/2016   EOSPCT 1 08/13/2016   BASOPCT 1 08/13/2016    CMP:  Lab Results  Component Value Date   NA 139 08/17/2016   K 4.0 08/17/2016   CL 113 (H) 08/17/2016   CO2 22 08/17/2016   BUN 6 08/17/2016  CREATININE 0.79 08/17/2016   PROT 3.4 (L) 08/15/2016   ALBUMIN 1.8 (L) 08/15/2016   BILITOT 0.8 08/15/2016   ALKPHOS 58 08/15/2016   AST 18  08/15/2016   ALT 59 (H) 08/15/2016  .   Total Time in preparing paper work, data evaluation and todays exam - 73 minutes  Lala Lund M.D on 08/18/2016 at 10:57 AM  [facility]   Office  [phone]

## 2016-08-30 DIAGNOSIS — K830 Cholangitis: Secondary | ICD-10-CM | POA: Diagnosis not present

## 2016-08-30 DIAGNOSIS — K922 Gastrointestinal hemorrhage, unspecified: Secondary | ICD-10-CM | POA: Diagnosis not present

## 2016-08-30 DIAGNOSIS — R441 Visual hallucinations: Secondary | ICD-10-CM | POA: Diagnosis not present

## 2016-08-30 DIAGNOSIS — R7881 Bacteremia: Secondary | ICD-10-CM | POA: Diagnosis not present

## 2016-08-30 DIAGNOSIS — R251 Tremor, unspecified: Secondary | ICD-10-CM | POA: Diagnosis not present

## 2016-09-28 DIAGNOSIS — I5030 Unspecified diastolic (congestive) heart failure: Secondary | ICD-10-CM | POA: Diagnosis not present

## 2016-09-28 DIAGNOSIS — Z7984 Long term (current) use of oral hypoglycemic drugs: Secondary | ICD-10-CM | POA: Diagnosis not present

## 2016-09-28 DIAGNOSIS — F319 Bipolar disorder, unspecified: Secondary | ICD-10-CM | POA: Diagnosis not present

## 2016-09-28 DIAGNOSIS — E1122 Type 2 diabetes mellitus with diabetic chronic kidney disease: Secondary | ICD-10-CM | POA: Diagnosis not present

## 2016-09-28 DIAGNOSIS — I129 Hypertensive chronic kidney disease with stage 1 through stage 4 chronic kidney disease, or unspecified chronic kidney disease: Secondary | ICD-10-CM | POA: Diagnosis not present

## 2016-09-28 DIAGNOSIS — E1142 Type 2 diabetes mellitus with diabetic polyneuropathy: Secondary | ICD-10-CM | POA: Diagnosis not present

## 2016-09-28 DIAGNOSIS — N183 Chronic kidney disease, stage 3 (moderate): Secondary | ICD-10-CM | POA: Diagnosis not present

## 2016-09-28 DIAGNOSIS — Z0000 Encounter for general adult medical examination without abnormal findings: Secondary | ICD-10-CM | POA: Diagnosis not present

## 2016-09-28 DIAGNOSIS — R251 Tremor, unspecified: Secondary | ICD-10-CM | POA: Diagnosis not present

## 2016-09-28 DIAGNOSIS — I2510 Atherosclerotic heart disease of native coronary artery without angina pectoris: Secondary | ICD-10-CM | POA: Diagnosis not present

## 2016-09-28 DIAGNOSIS — E782 Mixed hyperlipidemia: Secondary | ICD-10-CM | POA: Diagnosis not present

## 2017-01-29 DIAGNOSIS — E1142 Type 2 diabetes mellitus with diabetic polyneuropathy: Secondary | ICD-10-CM | POA: Diagnosis not present

## 2017-01-29 DIAGNOSIS — K219 Gastro-esophageal reflux disease without esophagitis: Secondary | ICD-10-CM | POA: Diagnosis not present

## 2017-01-29 DIAGNOSIS — Z23 Encounter for immunization: Secondary | ICD-10-CM | POA: Diagnosis not present

## 2017-01-29 DIAGNOSIS — E1122 Type 2 diabetes mellitus with diabetic chronic kidney disease: Secondary | ICD-10-CM | POA: Diagnosis not present

## 2017-01-29 DIAGNOSIS — N183 Chronic kidney disease, stage 3 (moderate): Secondary | ICD-10-CM | POA: Diagnosis not present

## 2017-01-29 DIAGNOSIS — R251 Tremor, unspecified: Secondary | ICD-10-CM | POA: Diagnosis not present

## 2017-01-29 DIAGNOSIS — I129 Hypertensive chronic kidney disease with stage 1 through stage 4 chronic kidney disease, or unspecified chronic kidney disease: Secondary | ICD-10-CM | POA: Diagnosis not present

## 2017-01-29 DIAGNOSIS — N3010 Interstitial cystitis (chronic) without hematuria: Secondary | ICD-10-CM | POA: Diagnosis not present

## 2017-01-29 DIAGNOSIS — F319 Bipolar disorder, unspecified: Secondary | ICD-10-CM | POA: Diagnosis not present

## 2017-02-08 DIAGNOSIS — F3132 Bipolar disorder, current episode depressed, moderate: Secondary | ICD-10-CM | POA: Diagnosis not present

## 2017-03-08 DIAGNOSIS — B029 Zoster without complications: Secondary | ICD-10-CM | POA: Diagnosis not present

## 2017-03-08 DIAGNOSIS — R0789 Other chest pain: Secondary | ICD-10-CM | POA: Diagnosis not present

## 2017-05-30 DIAGNOSIS — F319 Bipolar disorder, unspecified: Secondary | ICD-10-CM | POA: Diagnosis not present

## 2017-07-01 DIAGNOSIS — M79671 Pain in right foot: Secondary | ICD-10-CM | POA: Diagnosis not present

## 2017-07-01 DIAGNOSIS — E1122 Type 2 diabetes mellitus with diabetic chronic kidney disease: Secondary | ICD-10-CM | POA: Diagnosis not present

## 2017-07-01 DIAGNOSIS — E1142 Type 2 diabetes mellitus with diabetic polyneuropathy: Secondary | ICD-10-CM | POA: Diagnosis not present

## 2017-07-01 DIAGNOSIS — J301 Allergic rhinitis due to pollen: Secondary | ICD-10-CM | POA: Diagnosis not present

## 2017-07-01 DIAGNOSIS — K219 Gastro-esophageal reflux disease without esophagitis: Secondary | ICD-10-CM | POA: Diagnosis not present

## 2017-07-01 DIAGNOSIS — M79672 Pain in left foot: Secondary | ICD-10-CM | POA: Diagnosis not present

## 2017-07-01 DIAGNOSIS — N183 Chronic kidney disease, stage 3 (moderate): Secondary | ICD-10-CM | POA: Diagnosis not present

## 2017-07-01 DIAGNOSIS — I129 Hypertensive chronic kidney disease with stage 1 through stage 4 chronic kidney disease, or unspecified chronic kidney disease: Secondary | ICD-10-CM | POA: Diagnosis not present

## 2017-08-09 DIAGNOSIS — F3132 Bipolar disorder, current episode depressed, moderate: Secondary | ICD-10-CM | POA: Diagnosis not present

## 2017-09-06 ENCOUNTER — Other Ambulatory Visit: Payer: Self-pay | Admitting: Internal Medicine

## 2017-09-06 ENCOUNTER — Ambulatory Visit
Admission: RE | Admit: 2017-09-06 | Discharge: 2017-09-06 | Disposition: A | Discharge status: Home / Self Care | Payer: PPO | Source: Ambulatory Visit | Attending: Internal Medicine | Admitting: Internal Medicine

## 2017-09-06 DIAGNOSIS — M5441 Lumbago with sciatica, right side: Secondary | ICD-10-CM | POA: Diagnosis not present

## 2017-09-06 DIAGNOSIS — M4186 Other forms of scoliosis, lumbar region: Secondary | ICD-10-CM | POA: Diagnosis not present

## 2017-09-24 ENCOUNTER — Other Ambulatory Visit: Payer: Self-pay | Admitting: Internal Medicine

## 2017-09-24 DIAGNOSIS — M5441 Lumbago with sciatica, right side: Secondary | ICD-10-CM

## 2017-09-28 ENCOUNTER — Ambulatory Visit
Admission: RE | Admit: 2017-09-28 | Discharge: 2017-09-28 | Disposition: A | Discharge status: Home / Self Care | Payer: PPO | Source: Ambulatory Visit | Attending: Internal Medicine | Admitting: Internal Medicine

## 2017-09-28 DIAGNOSIS — M48061 Spinal stenosis, lumbar region without neurogenic claudication: Secondary | ICD-10-CM | POA: Diagnosis not present

## 2017-09-28 DIAGNOSIS — M5441 Lumbago with sciatica, right side: Secondary | ICD-10-CM

## 2017-10-07 DIAGNOSIS — M4854XA Collapsed vertebra, not elsewhere classified, thoracic region, initial encounter for fracture: Secondary | ICD-10-CM | POA: Diagnosis not present

## 2017-10-07 DIAGNOSIS — M5126 Other intervertebral disc displacement, lumbar region: Secondary | ICD-10-CM | POA: Diagnosis not present

## 2017-10-07 DIAGNOSIS — I10 Essential (primary) hypertension: Secondary | ICD-10-CM | POA: Diagnosis not present

## 2017-11-14 DIAGNOSIS — M5126 Other intervertebral disc displacement, lumbar region: Secondary | ICD-10-CM | POA: Diagnosis not present

## 2017-11-21 DIAGNOSIS — I10 Essential (primary) hypertension: Secondary | ICD-10-CM | POA: Diagnosis not present

## 2017-11-21 DIAGNOSIS — M5126 Other intervertebral disc displacement, lumbar region: Secondary | ICD-10-CM | POA: Diagnosis not present

## 2017-12-17 DIAGNOSIS — M5126 Other intervertebral disc displacement, lumbar region: Secondary | ICD-10-CM | POA: Diagnosis not present

## 2017-12-19 DIAGNOSIS — M179 Osteoarthritis of knee, unspecified: Secondary | ICD-10-CM | POA: Diagnosis not present

## 2017-12-19 DIAGNOSIS — N183 Chronic kidney disease, stage 3 (moderate): Secondary | ICD-10-CM | POA: Diagnosis not present

## 2017-12-19 DIAGNOSIS — D510 Vitamin B12 deficiency anemia due to intrinsic factor deficiency: Secondary | ICD-10-CM | POA: Diagnosis not present

## 2017-12-19 DIAGNOSIS — E782 Mixed hyperlipidemia: Secondary | ICD-10-CM | POA: Diagnosis not present

## 2017-12-19 DIAGNOSIS — E1142 Type 2 diabetes mellitus with diabetic polyneuropathy: Secondary | ICD-10-CM | POA: Diagnosis not present

## 2017-12-19 DIAGNOSIS — I5030 Unspecified diastolic (congestive) heart failure: Secondary | ICD-10-CM | POA: Diagnosis not present

## 2017-12-19 DIAGNOSIS — I129 Hypertensive chronic kidney disease with stage 1 through stage 4 chronic kidney disease, or unspecified chronic kidney disease: Secondary | ICD-10-CM | POA: Diagnosis not present

## 2017-12-19 DIAGNOSIS — M19012 Primary osteoarthritis, left shoulder: Secondary | ICD-10-CM | POA: Diagnosis not present

## 2017-12-19 DIAGNOSIS — E1122 Type 2 diabetes mellitus with diabetic chronic kidney disease: Secondary | ICD-10-CM | POA: Diagnosis not present

## 2017-12-19 DIAGNOSIS — I480 Paroxysmal atrial fibrillation: Secondary | ICD-10-CM | POA: Diagnosis not present

## 2017-12-19 DIAGNOSIS — I2510 Atherosclerotic heart disease of native coronary artery without angina pectoris: Secondary | ICD-10-CM | POA: Diagnosis not present

## 2018-03-05 DEATH — deceased

## 2018-03-26 IMAGING — XA IR ANGIO/VISCERAL SELECTIVE EA VESSEL WO/W FLUSH
1 series · 13 of 24 positions shown · IV contrast (IODINE)
Comparison: none

INDICATION: Lower GI bleed and migration of previously placed endoscopic biliary
stent into proximal colon. Nuclear medicine bleeding scan yesterday
did not show evidence of active bleeding and request has been made
to perform arteriography due to persistent clinical bleeding.
Colonoscopy demonstrates bright red blood in the colon without focal
bleeding source demonstrated.

[Series 300: dsa body · 13 of 79 slices shown]
[im 1/79]
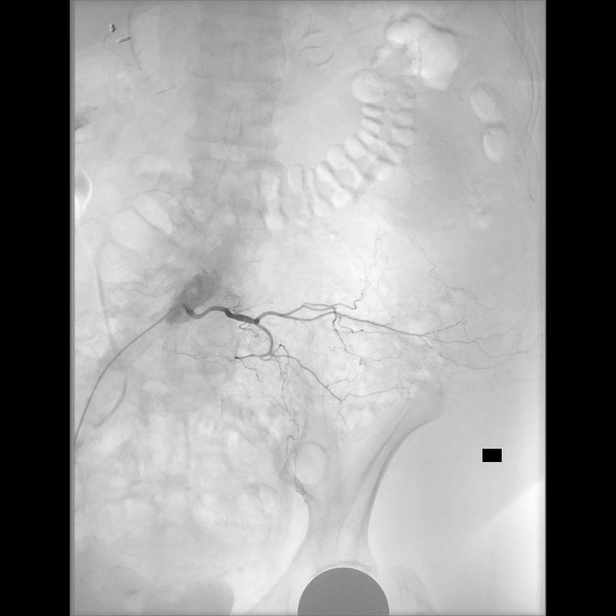
[im 7/79]
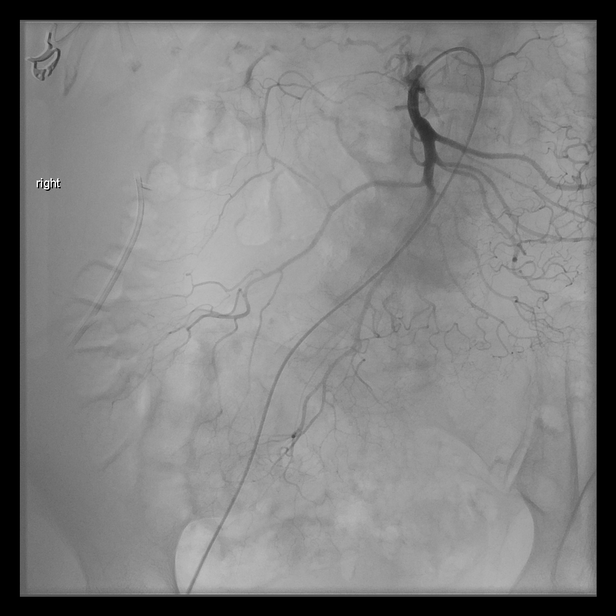
[im 14/79]
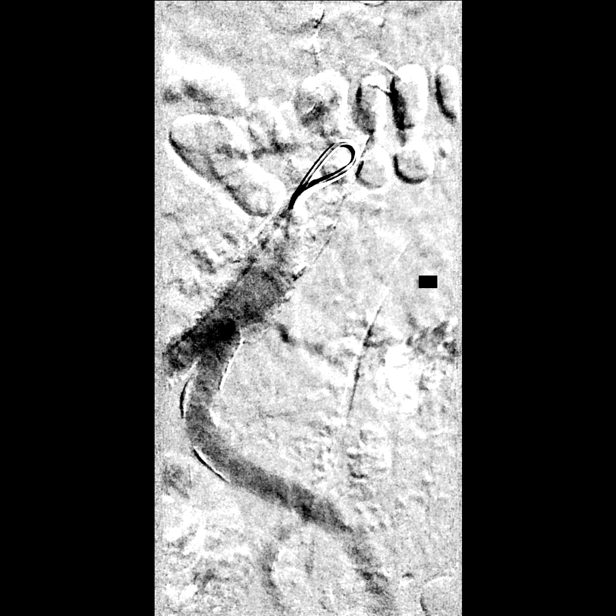
[im 21/79]
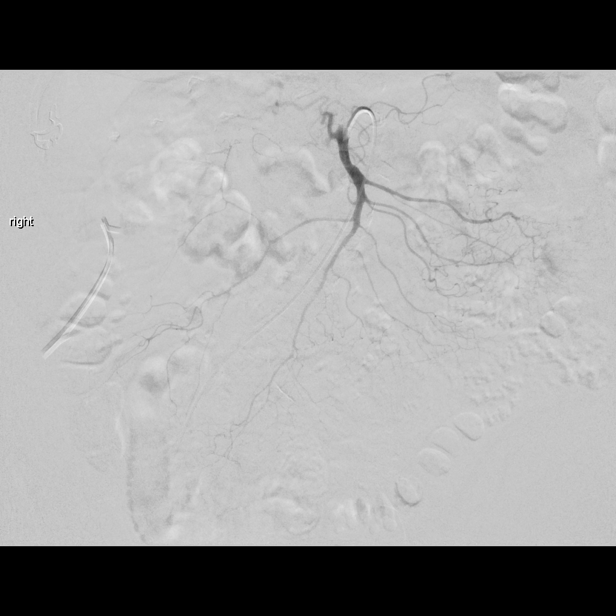
[im 28/79]
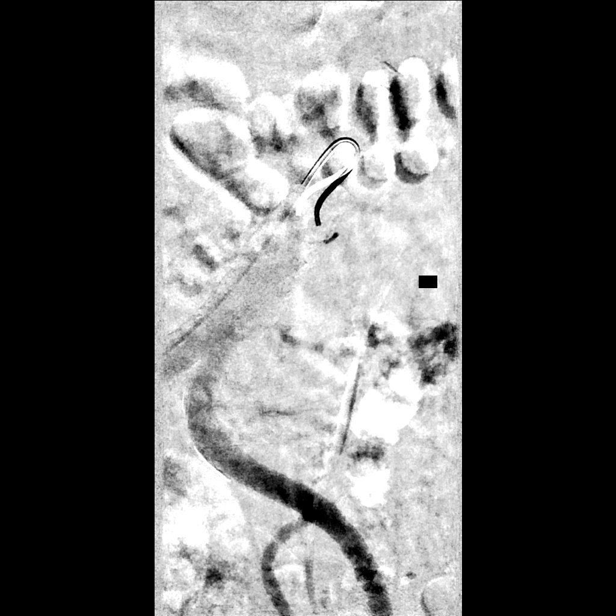
[im 34/79]
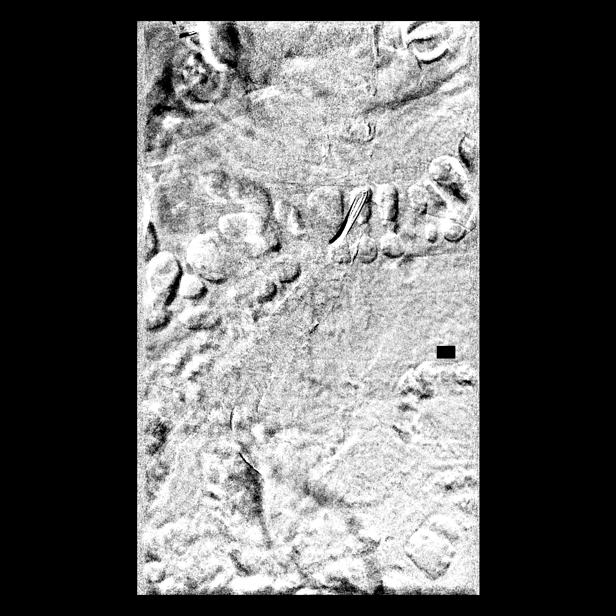
[im 41/79]
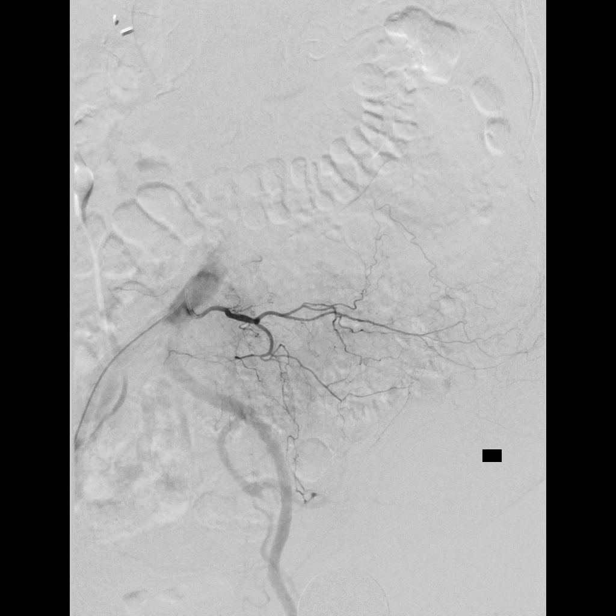
[im 45/79]
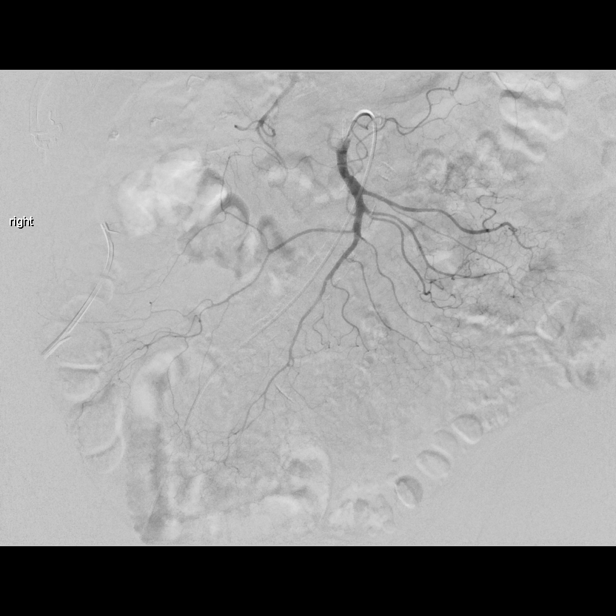
[im 51/79]
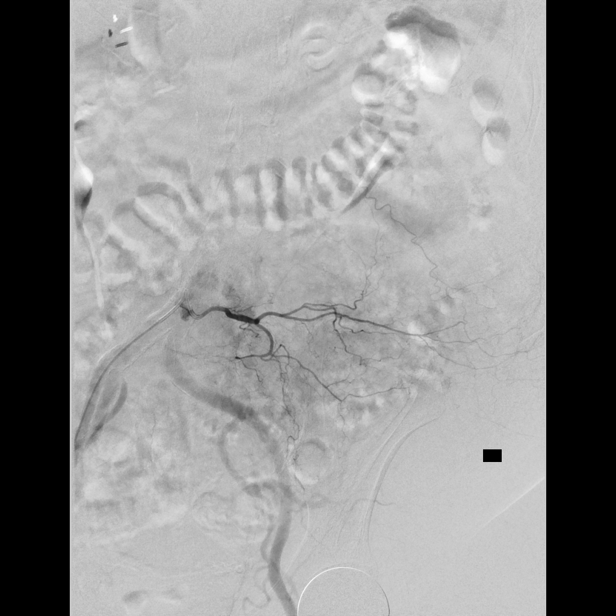
[im 58/79]
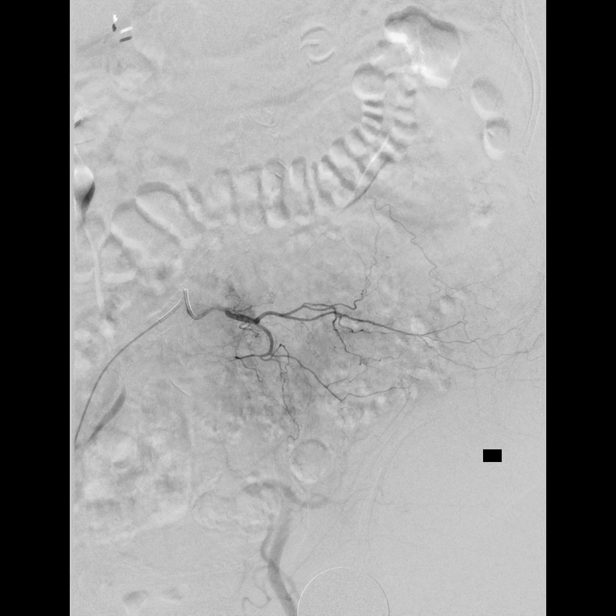
[im 65/79]
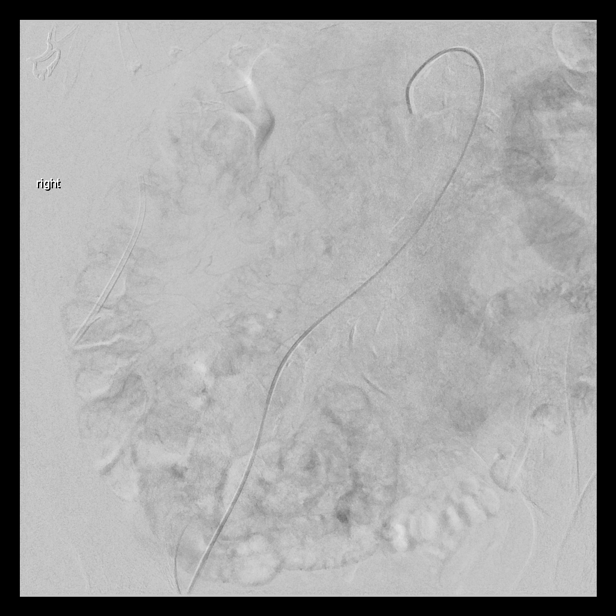
[im 72/79]
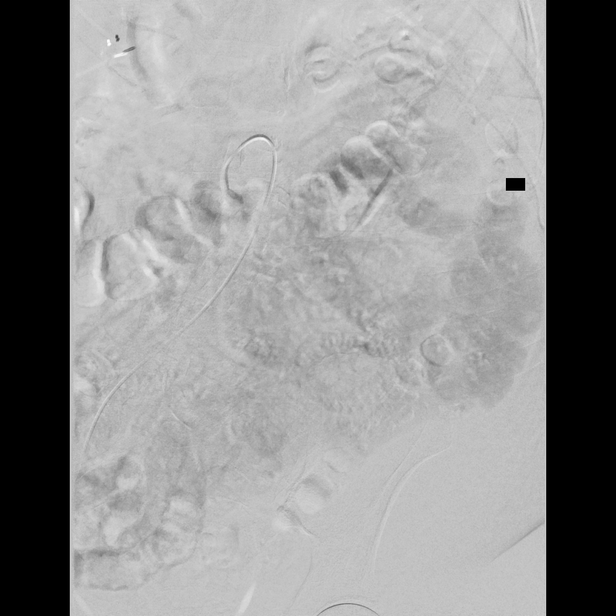
[im 79/79]
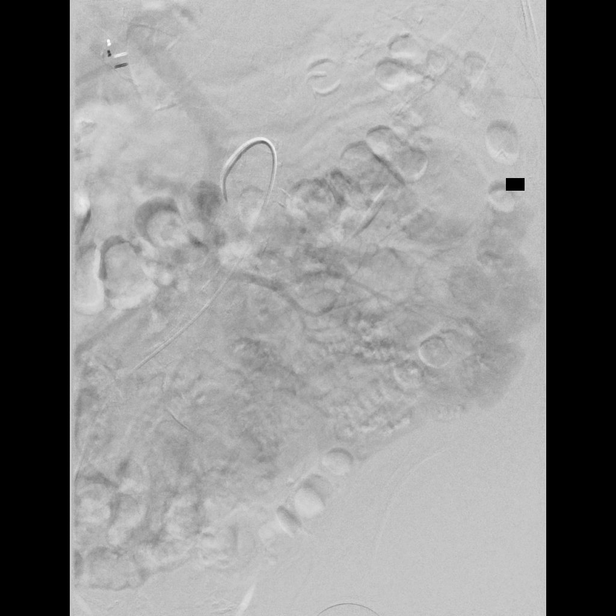

[13 of 24 positions shown; findings below may reference images not displayed]

EXAM:
1. ULTRASOUND GUIDANCE FOR VASCULAR ACCESS OF THE RIGHT COMMON
FEMORAL ARTERY
2. SELECTIVE VISCERAL ARTERIOGRAPHY OF THE SUPERIOR MESENTERIC
ARTERY
3. SELECTIVE ARTERIOGRAPHY OF LUMBAR ARTERY

MEDICATIONS:
100 mcg intra-arterial nitroglycerin

ANESTHESIA/SEDATION:
Moderate (conscious) sedation was employed during this procedure. A
total of Versed 1.5 mg and Fentanyl 25 mcg was administered
intravenously.

Moderate Sedation Time: 50 minutes. The patient's level of
consciousness and vital signs were monitored continuously by
radiology nursing throughout the procedure under my direct
supervision.

CONTRAST:  120 mL Gsovue-TRR

FLUOROSCOPY TIME:  Fluoroscopy Time: 14 minutes 18 seconds. (171.6
mGy).

COMPLICATIONS:
None immediate.

PROCEDURE:
Informed consent was obtained from the patient's husband following
explanation of the procedure, risks, benefits and alternatives. The
patient's husband understands, agrees and consents for the
procedure. All questions were addressed. A time out was performed
prior to the initiation of the procedure. Maximal barrier sterile
technique utilized including caps, mask, sterile gowns, sterile
gloves, large sterile drape, hand hygiene, and Betadine prep.

The right common femoral artery was localized by ultrasound and
patency confirmed by ultrasound. Access of the artery was performed
under direct ultrasound guidance with a micropuncture set and
ultrasound image documentation performed.

After access of the right common femoral artery, a 5 French sheath
was advanced over a guidewire. A 5 French Cobra catheter was then
advanced into the abdominal aorta. This was used to selectively
catheterize the superior mesenteric artery. The catheter was
advanced into the trunk of the superior mesenteric artery and
initial selective arteriography performed. Additional selective
arteriography was then performed of the entire SMA territory after
intra-arterial administration of 100 mcg nitroglycerin.

Attempt to find the origin of the inferior mesenteric artery was
then performed with the Cobra catheter. The Cobra catheter was then
exchanged for a 5 French Sos catheter and attempts made to find and
catheterize the inferior mesenteric artery origin. Arteriography of
the distal aorta was performed through the Sos catheter. Eventually,
a vessel was catheterized and selectively injected which
corresponded to a hypertrophied lumbar artery.

The Sos catheter was then removed over a guidewire. Hemostasis was
obtained at the femoral access site with the Cordis Exoseal device.
FINDINGS: Initial arteriography of the superior mesenteric artery shows
relative vasoconstriction with no evidence of arterial bleeding or
other vascular abnormality. After administration of intra-arterial
nitroglycerin, improved vasodilatation was present. Arteriography of
the entire SMA supply demonstrates no evidence of active arterial
bleeding, pseudoaneurysm, AV malformation or angiodysplasia. An
endoscopic plastic biliary stent is present in the ascending colon.
No arterial bleeding is identified at the level of the stent.

A search for the IMA origin was unsuccessful and it appears that the
IMA is chronically occluded. An enlarged trunk was identified
emanating off of the distal aorta which when catheterized represents
a hypertrophied left-sided lumbar artery versus other
retroperitoneal branch that supplies no bowel.
IMPRESSION: No arterial source for gastrointestinal bleeding identified by
arteriography of the superior mesenteric artery. No evidence of
underlying arteriovenous malformation or angiodysplasia. At the
level of a migrated endoscopic biliary stent within the ascending
colon, no active bleeding is demonstrated. The inferior mesenteric
artery origin was not able to be found or catheterized and it
appears that the IMA is chronically occluded.

## 2018-03-27 IMAGING — DX DG ABDOMEN 2V
3 series · 3 of 3 positions shown · non-contrast
Comparison: CT abdomen 08/11/2016

CLINICAL DATA: Images obtained for Displacement of biliary stent.
No current abdominal complaints.

EXAM:
ABDOMEN - 2 VIEW

[abdomen supine (1 of 2)]
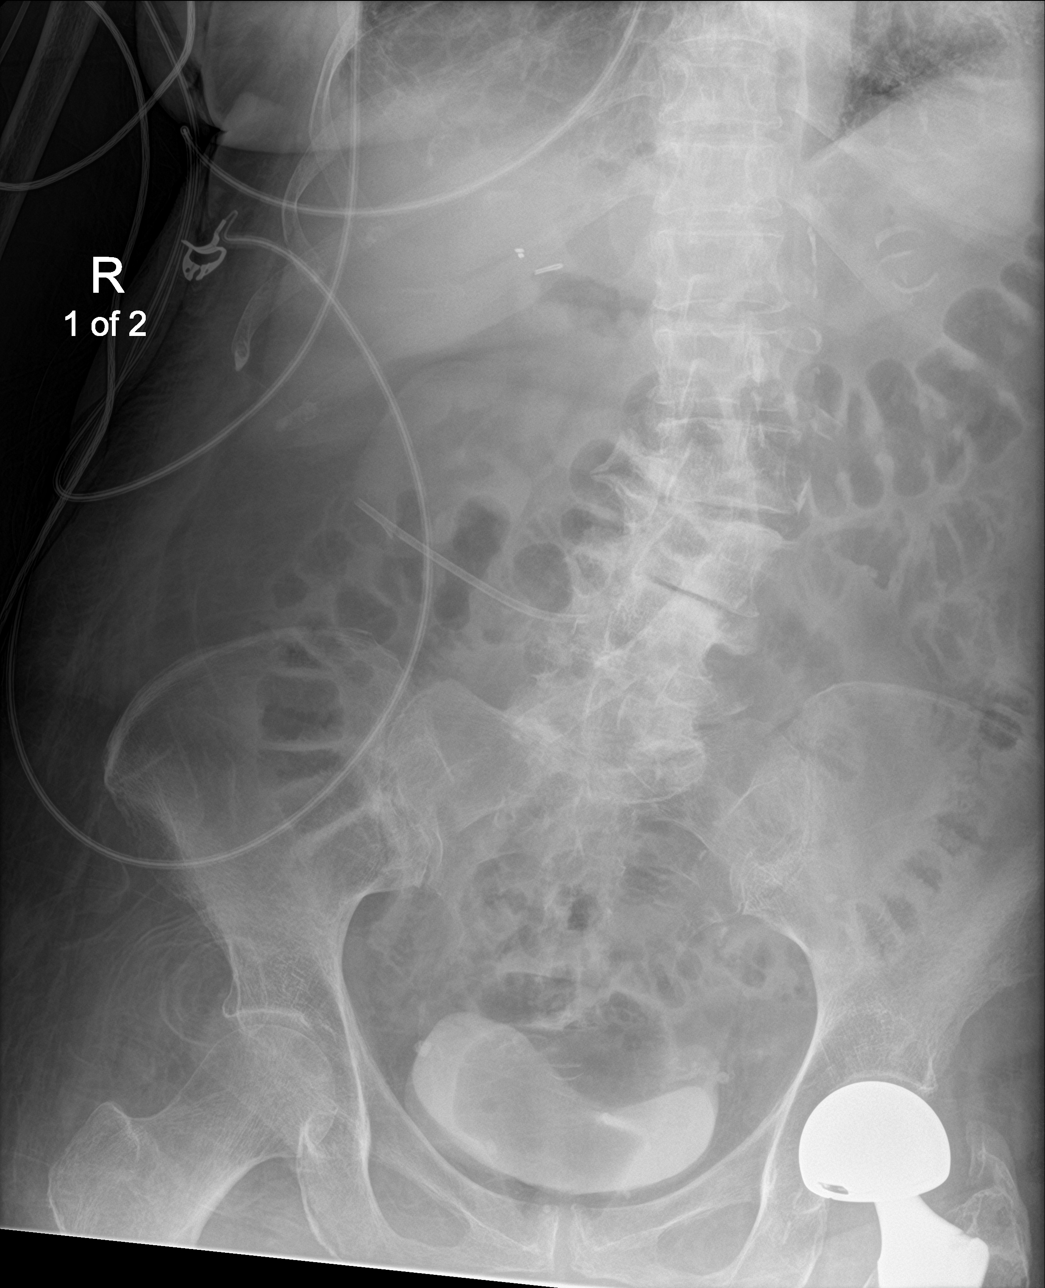

[abdomen decu]
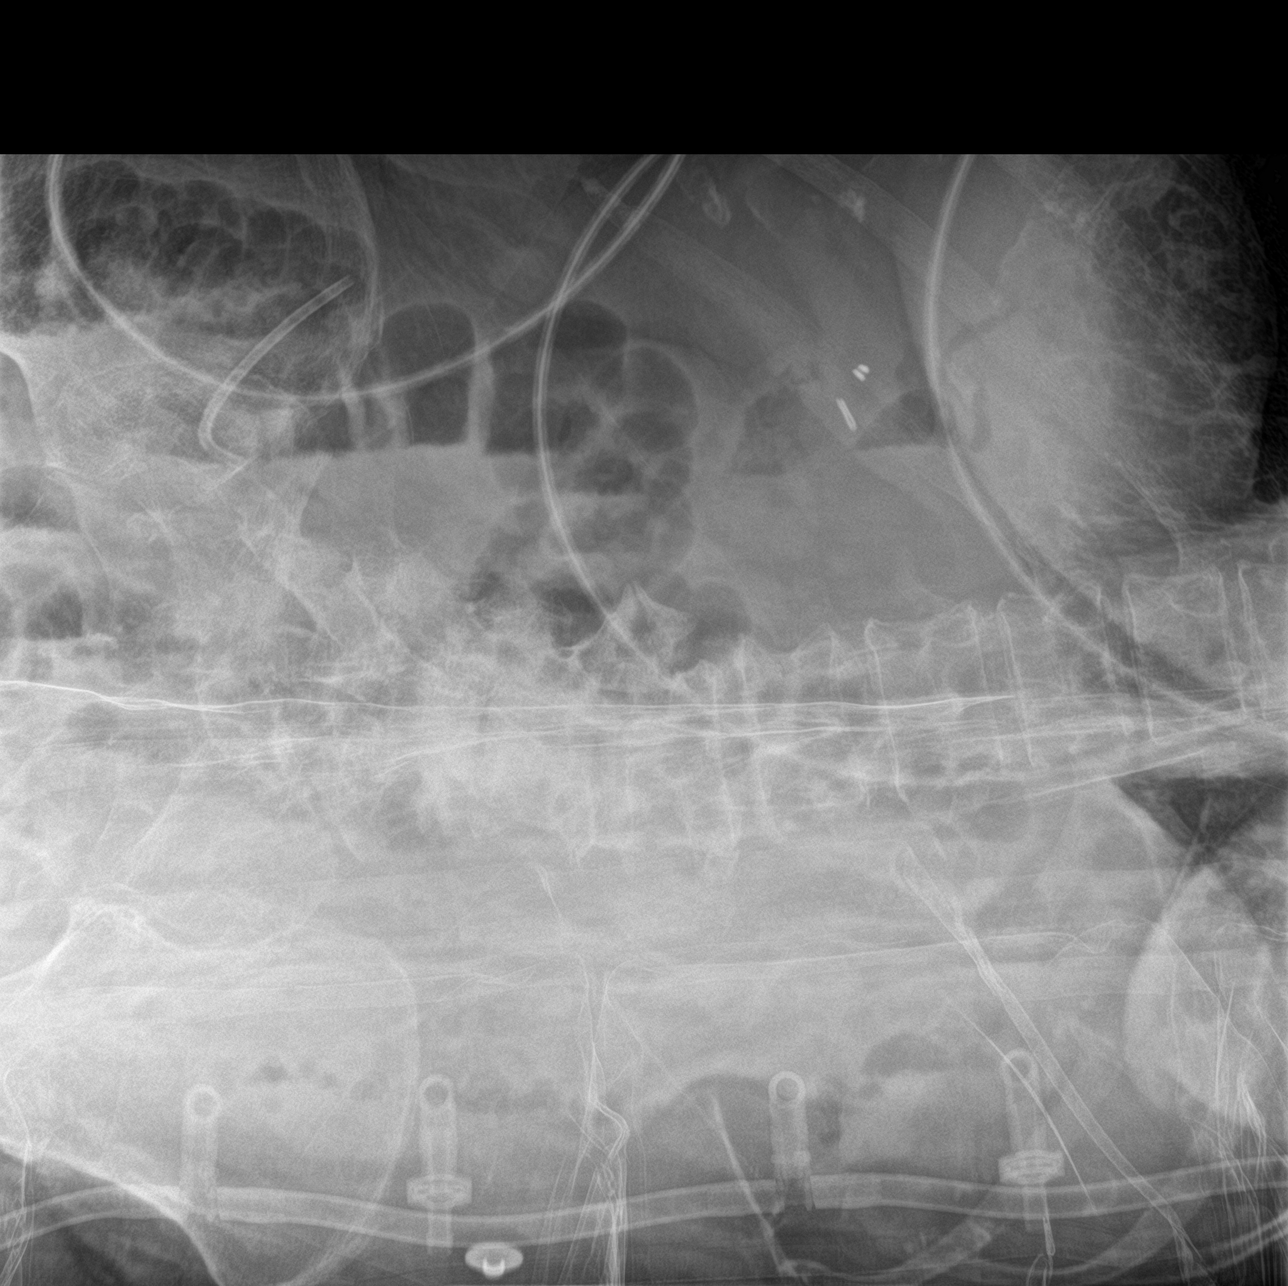

[abdomen supine (2 of 2)]
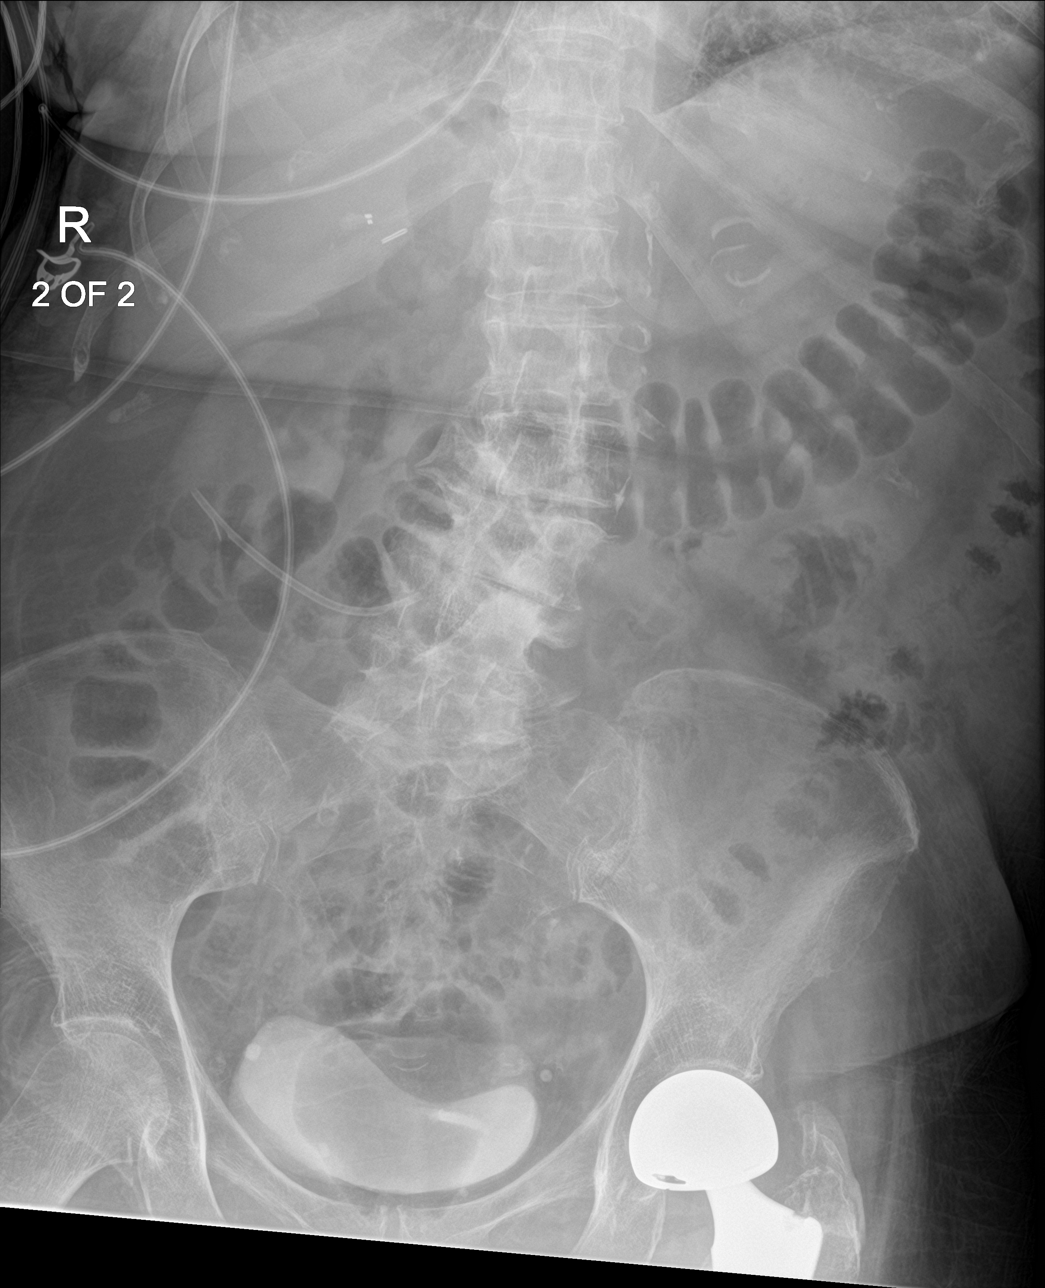

[3 of 3 positions shown; findings below may reference images not displayed]

FINDINGS: There is a biliary stent which is located in the right mid abdomen
to the right of the L4 vertebral body in a transverse orientation
concerning for displacement from the common bile duct. There is no
bowel dilatation to suggest obstruction. There is no evidence of
pneumoperitoneum, portal venous gas or pneumatosis.

There are no pathologic calcifications along the expected course of
the ureters.

Left total hip arthroplasty. Levoscoliosis of the lumbar spine.
Lumbar spine spondylosis.
IMPRESSION: There is a biliary stent which is located in the right mid abdomen
to the right of the L4 vertebral body in a transverse orientation
concerning for displacement from the common bile duct. If there is
further clinical concern regarding the exact location of the biliary
stent, recommend evaluation with a CT of the abdomen.
